# Patient Record
Sex: Female | Born: 1955
Health system: Southern US, Community
[De-identification: ages and names within clinical notes are randomized; demographics above are authoritative.]

## PROBLEM LIST (undated history)

## (undated) DIAGNOSIS — G709 Myoneural disorder, unspecified: Secondary | ICD-10-CM

## (undated) DIAGNOSIS — Z5189 Encounter for other specified aftercare: Secondary | ICD-10-CM

## (undated) DIAGNOSIS — T8859XA Other complications of anesthesia, initial encounter: Secondary | ICD-10-CM

## (undated) DIAGNOSIS — I4891 Unspecified atrial fibrillation: Secondary | ICD-10-CM

## (undated) DIAGNOSIS — C801 Malignant (primary) neoplasm, unspecified: Secondary | ICD-10-CM

## (undated) DIAGNOSIS — E0590 Thyrotoxicosis, unspecified without thyrotoxic crisis or storm: Secondary | ICD-10-CM

## (undated) DIAGNOSIS — K219 Gastro-esophageal reflux disease without esophagitis: Secondary | ICD-10-CM

## (undated) DIAGNOSIS — T7840XA Allergy, unspecified, initial encounter: Secondary | ICD-10-CM

## (undated) DIAGNOSIS — I10 Essential (primary) hypertension: Secondary | ICD-10-CM

## (undated) DIAGNOSIS — J189 Pneumonia, unspecified organism: Secondary | ICD-10-CM

## (undated) DIAGNOSIS — M1990 Unspecified osteoarthritis, unspecified site: Secondary | ICD-10-CM

## (undated) DIAGNOSIS — D649 Anemia, unspecified: Secondary | ICD-10-CM

## (undated) DIAGNOSIS — H269 Unspecified cataract: Secondary | ICD-10-CM

## (undated) HISTORY — DX: Allergy, unspecified, initial encounter: T78.40XA

## (undated) HISTORY — DX: Unspecified atrial fibrillation: I48.91

## (undated) HISTORY — DX: Malignant (primary) neoplasm, unspecified: C80.1

## (undated) HISTORY — DX: Unspecified cataract: H26.9

## (undated) HISTORY — DX: Encounter for other specified aftercare: Z51.89

## (undated) HISTORY — PX: TOTAL ABDOMINAL HYSTERECTOMY: SHX209

## (undated) HISTORY — DX: Unspecified osteoarthritis, unspecified site: M19.90

## (undated) HISTORY — DX: Anemia, unspecified: D64.9

## (undated) HISTORY — DX: Thyrotoxicosis, unspecified without thyrotoxic crisis or storm: E05.90

---

## 1986-06-26 HISTORY — PX: CHOLECYSTECTOMY: SHX55

## 1999-10-12 ENCOUNTER — Other Ambulatory Visit: Admission: RE | Admit: 1999-10-12 | Discharge: 1999-10-12 | Payer: Self-pay | Admitting: Obstetrics and Gynecology

## 2000-09-07 ENCOUNTER — Other Ambulatory Visit: Admission: RE | Admit: 2000-09-07 | Discharge: 2000-09-07 | Payer: Self-pay | Admitting: Obstetrics and Gynecology

## 2000-09-07 ENCOUNTER — Encounter (INDEPENDENT_AMBULATORY_CARE_PROVIDER_SITE_OTHER): Payer: Self-pay

## 2000-10-22 ENCOUNTER — Other Ambulatory Visit: Admission: RE | Admit: 2000-10-22 | Discharge: 2000-10-22 | Payer: Self-pay | Admitting: Obstetrics and Gynecology

## 2001-10-28 ENCOUNTER — Other Ambulatory Visit: Admission: RE | Admit: 2001-10-28 | Discharge: 2001-10-28 | Payer: Self-pay | Admitting: Obstetrics and Gynecology

## 2002-05-15 ENCOUNTER — Encounter (INDEPENDENT_AMBULATORY_CARE_PROVIDER_SITE_OTHER): Payer: Self-pay

## 2002-05-15 ENCOUNTER — Observation Stay (HOSPITAL_COMMUNITY): Admission: RE | Admit: 2002-05-15 | Discharge: 2002-05-16 | Payer: Self-pay | Admitting: Obstetrics and Gynecology

## 2002-05-17 ENCOUNTER — Encounter: Payer: Self-pay | Admitting: Obstetrics and Gynecology

## 2002-05-17 ENCOUNTER — Encounter: Payer: Self-pay | Admitting: Emergency Medicine

## 2002-05-17 ENCOUNTER — Inpatient Hospital Stay (HOSPITAL_COMMUNITY): Admission: EM | Admit: 2002-05-17 | Discharge: 2002-05-18 | Payer: Self-pay | Admitting: Emergency Medicine

## 2002-05-18 ENCOUNTER — Encounter: Payer: Self-pay | Admitting: Critical Care Medicine

## 2002-08-15 ENCOUNTER — Encounter (INDEPENDENT_AMBULATORY_CARE_PROVIDER_SITE_OTHER): Payer: Self-pay | Admitting: *Deleted

## 2002-08-15 ENCOUNTER — Ambulatory Visit (HOSPITAL_COMMUNITY): Admission: RE | Admit: 2002-08-15 | Discharge: 2002-08-15 | Payer: Self-pay | Admitting: Obstetrics and Gynecology

## 2003-01-21 ENCOUNTER — Other Ambulatory Visit: Admission: RE | Admit: 2003-01-21 | Discharge: 2003-01-21 | Payer: Self-pay | Admitting: Obstetrics and Gynecology

## 2004-02-10 ENCOUNTER — Other Ambulatory Visit: Admission: RE | Admit: 2004-02-10 | Discharge: 2004-02-10 | Payer: Self-pay | Admitting: Obstetrics and Gynecology

## 2005-03-07 ENCOUNTER — Other Ambulatory Visit: Admission: RE | Admit: 2005-03-07 | Discharge: 2005-03-07 | Payer: Self-pay | Admitting: Obstetrics and Gynecology

## 2005-06-26 HISTORY — PX: ABDOMINAL HYSTERECTOMY: SHX81

## 2006-06-01 ENCOUNTER — Encounter (INDEPENDENT_AMBULATORY_CARE_PROVIDER_SITE_OTHER): Payer: Self-pay | Admitting: *Deleted

## 2006-06-01 ENCOUNTER — Inpatient Hospital Stay (HOSPITAL_COMMUNITY): Admission: RE | Admit: 2006-06-01 | Discharge: 2006-06-04 | Payer: Self-pay | Admitting: Obstetrics and Gynecology

## 2008-12-03 ENCOUNTER — Encounter: Admission: RE | Admit: 2008-12-03 | Discharge: 2008-12-03 | Payer: Self-pay | Admitting: Family Medicine

## 2009-10-07 ENCOUNTER — Other Ambulatory Visit: Admission: RE | Admit: 2009-10-07 | Discharge: 2009-10-07 | Payer: Self-pay | Admitting: Family Medicine

## 2009-12-17 ENCOUNTER — Encounter: Admission: RE | Admit: 2009-12-17 | Discharge: 2009-12-17 | Payer: Self-pay | Admitting: Family Medicine

## 2010-11-11 NOTE — Op Note (Signed)
   NAME:  Jean Hopkins, Jean Hopkins                             ACCOUNT NO.:  [id number]   MEDICAL RECORD NO.:  [id number]                   PATIENT TYPE:  AMB   LOCATION:  SDC                                  [facility]ACILITY:  [facility]   PHYSICIAN:  Duke Salvia. Marcelle Overlie, M.D.            DATE OF BIRTH:  1956-01-27   DATE OF PROCEDURE:  05/15/2002  DATE OF DISCHARGE:                                 OPERATIVE REPORT   PREOPERATIVE DIAGNOSES:  Abnormal uterine bleeding with anemia.   POSTOPERATIVE DIAGNOSES:  Abnormal uterine bleeding with anemia.   PROCEDURE:  Dilatation and curettage/hysteroscopy.   SURGEON:  Duke Salvia. Marcelle Overlie, M.D.   ANESTHESIA:  Sedation plus paracervical block.   PROCEDURE AND FINDINGS:  The patient went to the operating room.  After an  adequate level of sedation was obtained, we placed the patient's legs in  stirrups.  Perineum and vagina were prepped with Betadine.  The bladder was  drained.  EUA carried out.  Uterus was upper limit of normal size.  Adnexa  negative.  Cervix grasped with a tenaculum after speculum was positioned.  Paracervical block created by infiltrating at 3 and 9 o'clock submucosally 5-  7 cc of 1% Xylocaine on either side after negative aspiration.  The cervix  was already sufficiently dilated which sounded to 9 cm.  D&C was carried  out.  Large amount of tissue was removed.  Cavity was explored with the  polyp forceps.  No polypoid tissue was removed.  After this was completed  the 7 mm continuous flow diagnostic hysteroscope was inserted.  The cavity  was rinsed out.  Further inspection revealed still some buildup of tissue  but there were no other definite abnormalities, specifically, no submucous  fibroids or remaining polypoid type tissue.  A second pass total curettage  was carried out.  Again, the cavity was rinsed.  Better view obtained this  time revealing it to be clean.  She tolerated this well.  EBL 10 cc.  Specimens removed were endometrial  curettings.  She went to recovery room in  good condition.                                               Richard M. Marcelle Overlie, M.D.    RMH/MEDQ  D:  05/15/2002  T:  05/15/2002  Job:  [id number]

## 2010-11-11 NOTE — Op Note (Signed)
   NAME:  Jean Hopkins, Jean Hopkins                             ACCOUNT NO.:  [id number]   MEDICAL RECORD NO.:  [id number]                   PATIENT TYPE:  AMB   LOCATION:  SDC                                  [facility]   PHYSICIAN:  Duke Salvia. Marcelle Overlie, M.D.            DATE OF BIRTH:  04/17/1956   DATE OF PROCEDURE:  08/15/2002  DATE OF DISCHARGE:                                 OPERATIVE REPORT   PREOPERATIVE DIAGNOSES:  Abnormal uterine bleeding.   POSTOPERATIVE DIAGNOSES:  Abnormal uterine bleeding.   PROCEDURE:  1. Dilatation and curettage.  2. Cryo ablation.   SURGEON:  Duke Salvia. Marcelle Overlie, M.D.   ANESTHESIA:  MAC plus paracervical block.   PROCEDURE AND FINDINGS:  The patient went to the operating room.  After an  adequate level of sedation was obtained with the patient's legs in stirrups,  perineum and vagina prepped and draped in usual manner for hysteroscopy.  Bladder was drained.  EUA carried out.  Uterus was upper limit of normal  size, mid position.  Adnexa negative.  Cervix grasped with tenaculum.  Paracervical block created by infiltrating at 3 and 9 o'clock submucosally.  5-7 mL of 1% Xylocaine on either side after negative aspiration.  The uterus  was then sounded to 12 cm.  Due to some technical difficulties with the  hysteroscope insufflation she was bypassed since she was just scoped several  months ago.  A preoperative D&C was carried out.  There was minimal tissue  and the cavity had demonstrated to be normal previously.  After this was  noted the cryo probe was placed into the right cornu at depth of  approximately 12 cm.  Six minute cryo cycle was performed.  This was allowed  to thaw out.  After waiting several minutes for the ice balls to shrink, the  cryo probe was placed in the opposite cornu at the same appropriate depth  and a six minute cryo cycle was performed.  She tolerated this well.  Went  to recovery room in good condition.                         Richard M. Marcelle Overlie, M.D.    RMH/MEDQ  D:  08/15/2002  T:  08/15/2002  Job:  [id number]

## 2010-11-11 NOTE — H&P (Signed)
NAME:  Jean Hopkins, Jean Hopkins                             ACCOUNT NO.:  [id number]   MEDICAL RECORD NO.:  [id number]                   PATIENT TYPE:  MAT   LOCATION:  MATC                                 [facility]   PHYSICIAN:  Shan Levans, M.D. [facility]            DATE OF BIRTH:  Jan 03, 1956   DATE OF ADMISSION:  05/17/2002  DATE OF DISCHARGE:                                HISTORY & PHYSICAL   CHIEF COMPLAINT:  Orthopnea and shortness of breath.   HISTORY OF PRESENT ILLNESS:  The patient is a 55 year old white female with  history of obesity. She had a D&C and hysteroscopy three days ago. She  stayed overnight for this because of nausea and vomiting and was given IV  fluids. She then developed acute onset of shortness of breath and orthopnea  this evening. No cough, no chest pain, and no hemoptysis. She came to the  emergency room. Spiral CT was indeterminate for pulmonary emboli. She is  admitted to rule out  pulmonary embolism and is a non-smoker.   PAST MEDICAL HISTORY:  No history of hypertension, asthma, or pneumonia.   MEDICATIONS:  Birth control pills the last few weeks and iron  supplementation.   ALLERGIES:  No known drug allergies..   OPERATIONS:  Cholecystectomy in 1988.   SOCIAL HISTORY:  Noncontributory. The patient is a non-smoker. 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   REVIEW OF SYSTEMS:  Noncontributory.   FAMILY HISTORY:  Noncontributory.   PHYSICAL EXAMINATION:  VITAL SIGNS: Temperature 98. Blood pressure 143/90.  Saturation 99% on room air. Pulse 86, respiratory rate 18.  GENERAL: An obese white female in no acute distress.  CHEST: Clear.  LUNGS: No active wheezing. No adventitious sounds noted.  CARDIAC: Regular rate and rhythm. Without S3. Normal S1 and S2. No murmur.  ABDOMEN: Obese and nontender. Bowel sounds active. No organomegaly.  EXTREMITIES: Trace edema bilaterally in the lower extremities.  HEENT: No jugular venous distention. No  lymphadenopathy. Oropharynx clear.  SKIN: Clear.   DIAGNOSTIC IMPRESSION:  Chest x-ray showed peribronchial thickening. CT of  the chest showed no active disease. No air space disease. Questionable  intralobular clot but cannot tell because of poor quality study. EKG normal  sinus rhythm. Otherwise normal EKG.   LABORATORY DATA:  Hemoglobin 9.6, white count 12.3, INR 0.9, PTT 28. On room  air pH 7.46, pCO2 40, pO2 74.   IMPRESSION:  Clinical history is compatible with high probability for  pulmonary embolism. However, the spiral CT study is indeterminate. Will  therefore admit with IV Heparin. Administer pulmonary arteriogram in the  morning to rule out  pulmonary embolism. Consider venous Dopplers. Consider  echocardiogram and pulmonary function studies later. Will hydrate tonight  with IV fluid normal saline and give contrast bolus tonight with the CT scan  in anticipation of the pulmonary arteriogram to be done on November  23.  2003.                                               Shan Levans, M.D. [facility]    PW/MEDQ  D:  05/17/2002  T:  05/17/2002  Job:  [id number]   cc:   Guy Sandifer. Arleta Creek, M.D.  [address]  [place]  [place] [address]  Fax: [phone]

## 2010-11-11 NOTE — Discharge Summary (Signed)
   NAME:  Jean Hopkins, Jean Hopkins                             ACCOUNT NO.:  [id number]   MEDICAL RECORD NO.:  [id number]                   PATIENT TYPE:  OBV   LOCATION:  9304                                 [facility]ILITY[facility]   PHYSICIAN:  Duke Salvia. Marcelle Overlie, M.D.            DATE OF BIRTH:  12-01-1955   DATE OF ADMISSION:  05/15/2002  DATE OF DISCHARGE:  05/16/2002                                 DISCHARGE SUMMARY   DISCHARGE DIAGNOSES:  1. Anemia with abnormal uterine bleeding.  2. Dilatation and curettage hysteroscopy this admission.  3. Postoperative nausea requiring overnight hospitalization.   HISTORY OF PRESENT ILLNESS:  For summary of the History and physical exam,  please see admission H&P for details.  Briefly, this is a 55 year old, G2,  P2 with a two-year history of abnormal uterine bleeding who presents for [facility]  hysteroscopy.   HOSPITAL COURSE:  On November 20, the patient underwent uncomplicated D&C  hysteroscopy, but due to prolonged postoperative nausea that was difficult  to control, the decision was made to hospitalize her overnight where she  received IV fluids and Zofran.  By the following morning, she was tolerating  a regular diet and felt much better and was ready for discharge at that  point.   LABORATORY DATA AND X-RAY FINDINGS:  Hemoglobin on November 19, was 9.8.  UA  was unremarkable except for a large hemoglobin, many epithelial cells, too  numerous to count bacteria, blood type A positive, antibody screen was  negative.   DISCHARGE MEDICATIONS:  1. Zofran 8 mg ODT one p.o. q.6-8h. p.r.n. nausea.  2. Hemocyte.  3. Darvocet-d 100 for pain.   CONDITION ON DISCHARGE:  Good.   ACTIVITY:  Continue to increase.   FOLLOW UP:  We will plan to see her back in one week.                                               Richard M. Marcelle Overlie, M.D.    RMH/MEDQ  D:  06/12/2002  T:  06/13/2002  Job:  [id number]

## 2010-11-11 NOTE — Op Note (Signed)
NAME:  Jean Hopkins, Jean Hopkins                   ACCOUNT NO.:  [id number]   MEDICAL RECORD NO.:  [id number]          PATIENT TYPE:  AMB   LOCATION:  SDC                           [facility]   PHYSICIAN:  Duke Salvia. Marcelle Overlie, M.D.DATE OF BIRTH:  09/02/1955   DATE OF PROCEDURE:  06/01/2006  DATE OF DISCHARGE:                               OPERATIVE REPORT   PREOPERATIVE DIAGNOSIS:  Abnormal uterine bleeding, probable  adenomyosis, leiomyoma.   POSTOPERATIVE DIAGNOSIS:  Abnormal uterine bleeding, probable  adenomyosis, leiomyoma.   PROCEDURE:  Laparoscopically assisted vaginal hysterectomy.   SURGEON:  Duke Salvia. Marcelle Overlie, M.D.   ASSISTANT:  Zelphia Cairo, M.D.   ANESTHESIA:  General endotracheal.   COMPLICATIONS:  None.   DRAINS:  Foley catheter.   BLOOD LOSS:  200   SPECIMENS REMOVED:  Uterus.   PROCEDURE AND FINDINGS:  The patient was taken to the operating room and  after an adequate level of general endotracheal anesthesia was obtained  with the patient's legs in stirrups, the abdomen, perineum and vagina  were prepped and usual manner for vaginal procedures.  The bladder was  drained with an in-and-out Foley catheter.  Hulka tenaculum was  positioned.  Uterus itself was 8-10 weeks' size, symmetrically enlarged.  The subumbilical area was infiltrated with 0.5% Marcaine plain.  A small  incision was made and Veress needle introduced without difficulty; its  intra-abdominal position was verified by pressure and water testing.  After a 2.5-L pneumoperitoneum was then created, the laparoscopic trocar  and sleeve were then introduced without difficulty.  Three  fingerbreadths above the symphysis in the midline, a 5 mm trocar was  inserted.  The patient was then placed in Trendelenburg.  Pelvic  findings revealed the uterus was 8-10 weeks' size, symmetrically  enlarged, adnexa unremarkable, cul-de-sac free and clear.  The Gyrus PK  instrument was then used to coagulate and cut the  utero-ovarian pedicle  down to and including the round ligament on each side with excellent  hemostasis.  The vaginal portion of the procedure started at that point.  The legs were extended.  Weighted speculum was positioned.  Cervicovaginal mucosa was incised, posterior culdotomy performed without  difficulty.  The handheld Gyrus PK was then used to coagulate and divide  the uterosacral ligaments on each side.  The bladder was advanced  superiorly until the peritoneal reflection could be identified.  This  was entered sharply and a retractor used to gently elevate the bladder  out of the field.  In sequential manner, the cardinal ligament, uterine  vasculature pedicles and upper broad ligament pedicles were clamped,  coagulated and divided with the Gyrus PK handheld instrument.  Morcellation was carried out to reduce the size of the uterus.  The  remaining fundus was delivered posteriorly and the remaining pedicle was  clamped and divided and free-tied with 0 Vicryl suture.  Cuff was closed  with 3 to 9 o'clock with a running locked 2-0 Vicryl suture.  Prior to  closure, sponge, needle and instrument counts were reported as correct  x2, vaginal mucosa closed right-to-left with  interrupted 2-0 Monocryl  sutures, catheter positioned draining clear urine.  Laparoscopy and  irrigation with a Nezhat was carried out.  Pressure was reduced.  The  operative site was noted to be hemostatic.  Instruments were removed.  The irrigant was suctioned out.  Incisions were closed with 4-0 Vicryl  Rapide subcuticular and Dermabond.  She tolerated this well and went to  the recovery room in good condition.      Richard M. Marcelle Overlie, M.D.  Electronically Signed     RMH/MEDQ  D:  06/01/2006  T:  06/01/2006  Job:  [id number]

## 2010-11-11 NOTE — H&P (Signed)
NAME:  Laser, Caydee                   ACCOUNT NO.:  [id number]   MEDICAL RECORD NO.:  [id number]          PATIENT TYPE:  AMB   LOCATION:                                [facility]   PHYSICIAN:  Duke Salvia. Marcelle Overlie, M.D.DATE OF BIRTH:  [birth year]   DATE OF ADMISSION:  DATE OF DISCHARGE:                              HISTORY & PHYSICAL   CHIEF COMPLAINT:  Menorrhagia.   HISTORY OF PRESENT ILLNESS:  A 55 year old G2, P2.  Her husband has had  a vasectomy.  This patient underwent D&C/hysteroscopy and cryoablation  in 2004.  Initially got improvement of her bleeding, but now continues  to have significant menorrhagia.  Presents now for definitive LAVH.  This procedure, including the risks of bleeding, infection, adjacent  organ injury and possible need for open or additional surgery,  transfusion risks, other risks related to wound infection, phlebitis;  along with her expected recovery time all discussed -- which she  understands and accepts.  Her preference would be to conserve her  otherwise normal appearing ovaries, which will be evaluated at the time  of surgery.   Ultrasound dated May 19, 2006 in our office showed several  intramural fibroids (2.0, 1.7 and 1.3 cm), a simple cyst on the left  ovary; no other abnormalities.  Attempt at saline infusion showed  significant scarring of the cavity from her prior cryoablation.   PAST MEDICAL HISTORY:   ALLERGIES:  None.   OPERATIONS:  1. D&C/hysteroscopy.  2. Cholecystectomy.  3. Two vaginal deliveries at term without complication.   SOCIAL HISTORY:  She is a nonsmoker.   FAMILY HISTORY:  Significant for mother and father with hypertension;  otherwise unremarkable.   PHYSICAL EXAMINATION:  VITAL SIGNS:  Temperature 98.2, blood pressure  120/78.  HEENT:  Unremarkable.  NECK:  Supple without masses.  LUNGS:  Clear.  CARDIOVASCULAR:  Regular rate and rhythm without murmurs, rubs or  gallops.  BREASTS:  Without masses.  ABDOMEN:  Soft, flat and nontender.  PELVIC:  Normal external genitalia.  Antionette Fairy and cervix__ clear.  Uterus  normal size and position.  Adnexa negative.  EXTREMITIES:  Unremarkable.  NEUROLOGIC:  Unremarkable.   IMPRESSION:  Menorrhagia, symptomatic leiomyoma, status post  cryoablation.   PLAN:  Laparoscopically-assisted vaginal hysterectomy.  The procedure  and risks were reviewed as above.      Richard M. Marcelle Overlie, M.D.  Electronically Signed     RMH/MEDQ  D:  05/26/2006  T:  05/27/2006  Job:  [id number]

## 2010-11-11 NOTE — Discharge Summary (Signed)
NAME:  Jean Hopkins, Jean Hopkins                             ACCOUNT NO.:  [id number]   MEDICAL RECORD NO.:  [id number]                   PATIENT TYPE:  INP   LOCATION:  0379                                 FACILITY:  [facility]   PHYSICIAN:  Shan Levans, M.D. [facility]            DATE OF BIRTH:  23-Apr-1956   DATE OF ADMISSION:  05/17/2002  DATE OF DISCHARGE:  05/18/2002                                 DISCHARGE SUMMARY   DISCHARGE DIAGNOSES:  1. Dyspnea, likely from restrictive defect secondary to obesity in     combination with anemia.  Negative for pulmonary embolism.  2. Mild pulmonary artery hypertension likely secondary to restrictive defect     from obesity.   OPERATIONS/PROCEDURES:  1. Pulmonary arteriogram.  2. Spiral CT scan of the chest.   HISTORY OF PRESENT ILLNESS:  The patient is a 55 year old white female,  obese, had a D&C and hysterectomy three days ago.  Stayed overnight  secondary to nausea and vomiting.  Was given 4 L of IV fluid, then developed  acute onset of increased dyspnea and orthopnea.  No cough, no chest pain, no  hemoptysis.  She came to the emergency department.  Spiral CT was  inconclusive.  She was admitted for IV heparin and follow-up pulmonary  arteriogram.   PAST MEDICAL HISTORY:  No history of hypertension, asthma, or pneumonia.   MEDICATIONS:  Birth control pills.  Iron supplementation.   ALLERGIES:  None.   PAST SURGICAL HISTORY:  Cholecystectomy in 1988.   SOCIAL HISTORY:  Noncontributory.   FAMILY HISTORY:  Noncontributory.   REVIEW OF SYSTEMS:  Noncontributory.   PHYSICAL EXAMINATION:  VITAL SIGNS:  Temperature 98, blood pressure 143/90,  saturation 99% on room air, pulse 86, respirations 18.  CHEST:  Clear.  CARDIAC:  Regular rate and rhythm with S3.  No S1, S2.  No murmur.  ABDOMEN:  Obese.  No organomegaly.  No masses.  EXTREMITIES:  Trace edema bilateral lower extremities.  HEENT:  No jugular venous distention.  _____________.  The oropharynx  is  clear.   LABORATORY DATA:  Hemoglobin initially 9.6, was then followed up at 8.3  after heparinization and IV fluid hydration.  White count 12.3.  INR 0.9.  A  pH of 7.46, pCO2 was 40, pO2 74 on room air.   EKG:  Normal sinus rhythm.   HOSPITAL COURSE:  The patient underwent pulmonary arteriogram.  This  revealed no evidence of pulmonary emboli.  However, the pulmonary artery  pressures were elevated at 48/23 with a mean of 39, but no pulmonary emboli  were seen.  The patient's heparin was discontinued, and she was ready for  release.   DISPOSITION:  The patient is discharged to receive iron supplementation 325  mg three times daily.  Also, multivitamins are recommended.  She will follow  up in my office in two weeks' time with a repeat CBC and  set up for  pulmonary function studies and echocardiogram.  My suspicion is that the  dyspnea has a multifactorial basis secondary to obesity with restrictive  lung disease from obese body habitus and anemia which is in part dilutional  with recent D&C and vaginal blood loss and, as well, all of this conspiring  to cause mild pulmonary hypertension.  The patient is discharged in improved  condition, stable, and will stay off work for at least one week's time while  recovering from this.  Also will follow up with Dr. Henderson Cloud in his office.                                               Shan Levans, M.D. [facility]    PW/MEDQ  D:  05/18/2002  T:  05/18/2002  Job:  [id number]   cc:   Guy Sandifer. Arleta Creek, M.D.  [address]  [place]  [place] [address]  Fax: [phone]

## 2010-11-11 NOTE — H&P (Signed)
   NAME:  Jean Hopkins, Jean Hopkins                             ACCOUNT NO.:  [id number]   MEDICAL RECORD NO.:  [id number]                   PATIENT TYPE:  AMB   LOCATION:  SDC                                  [facility]   PHYSICIAN:  Duke Salvia. Marcelle Overlie, M.D.            DATE OF BIRTH:  March 25, 1956   DATE OF ADMISSION:  05/15/2002  DATE OF DISCHARGE:                                HISTORY & PHYSICAL   CHIEF COMPLAINT:  Menorrhagia with anemia.   HISTORY OF PRESENT ILLNESS:  A 55 year old G2 P2 whose husband has had a  vasectomy.  She has had a one- to two-year history of abnormal uterine  bleeding that had been treated previously with Prometrium cycling.  Back in  March 2002 office biopsy showed simple hyperplasia without atypia with a  normal FSH.  She was cycled with monthly Prometrium which she tapered off  and was actually having fairly normal cycles until recently, when she  presented with heavy bleeding.   When seen in our office recently, hemoglobin was 7.9 and she was started on  iron.  SHG showed a normal uterus with significant tissue buildup and a  probable endometrial polyp at the fundus.  She presents now for a D&C  hysteroscopy.  This procedure including risks of bleeding, infection,  transfusion, other complications such as perforation that may require  additional surgery all discussed with her which she understands and accepts.   ALLERGIES:  None.   OPERATIONS:  None.   REVIEW OF SYSTEMS:  Significant for DUB and anovulation.   OBSTETRICAL HISTORY:  Two vaginal deliveries without complication.   FAMILY HISTORY:  Significant for hypertension in her mother and father;  otherwise negative.   PHYSICAL EXAMINATION:  VITAL SIGNS:  Temperature 98.2, blood pressure  120/80.  HEENT:  Unremarkable.  NECK:  Supple without masses.  LUNGS:  Clear.  CARDIOVASCULAR:  Respiratory rate without murmurs, rubs, or gallops noted.  BREASTS:  Without masses.  ABDOMEN:  Soft, flat, and  nontender.  PELVIC:  Normal external genitalia, vagina and cervix clear.  Uterus mid  position, normal size.  Adnexa negative.   IMPRESSION:  Menorrhagia, history of anovulation, probable endometrial  polyps.   PLAN:  D&C hysteroscopy.  Procedure and risks reviewed as above.                                               Richard M. Marcelle Overlie, M.D.   RMH/MEDQ  D:  05/12/2002  T:  05/12/2002  Job:  [id number]

## 2010-11-11 NOTE — Discharge Summary (Signed)
NAME:  Jean Hopkins, Jean Hopkins                   ACCOUNT NO.:  [id number]   MEDICAL RECORD NO.:  [id number]          PATIENT TYPE:  INP   LOCATION:  9309                          FACILITY:  [facility]   PHYSICIAN:  Duke Salvia. Marcelle Overlie, M.D.DATE OF BIRTH:  17-Dec-1955   DATE OF ADMISSION:  06/01/2006  DATE OF DISCHARGE:  06/04/2006                               DISCHARGE SUMMARY   DISCHARGE DIAGNOSES:  1. Abnormal uterine bleeding, previous cryoablation.  2. Laparoscopic assisted vaginal hysterectomy.  3. Postop bleeding requiring postop laparotomy with evacuation of      clot.  4. Two-unit blood transfusion.   SUMMARY OF HISTORY AND PHYSICAL EXAM:  Please see admission H&P for  details.  Briefly, this is a 55 year old who has had a prior  cryoablation and continued to have problematic menorrhagia and presented  for definitive hysterectomy.   HOSPITAL COURSE:  On June 01, 2006, under general anesthesia, the  patient underwent LAVH with 200 cc  blood loss.  Later that afternoon,  due to some hypotension, there was concern about possible vasovagal  reaction.  Initially, anesthesia was called, and she was given fluids  and ephedrine.  Her hemoglobin at that point was in the 9.6 range but  represented a substantial drop from her preop of 14.  Due to hemodynamic  instability with low blood pressure and orthostasis, the decision was  made to proceed with exploratory surgery for expected intra-abdominal  bleeding.  She had no external bleeding at that point.   On the night of surgery, she underwent laparoscopy.  Significant amount  of clot was noted.  Good exposure could not be obtained.  Therefore,  laparotomy was done.  She had evacuation of a large amount of clot and  had pressure applied to the surgical site.  There was some minimal  venous oozing at that point.  Careful observation revealed no arterial  bleeding.  There was no other evidence of any retroperitoneal hematoma.  Gelfoam was placed at  the vaginal cuff area.  She was observed carefully  in the recovery room.  During the night, hemoglobin had a low of 5.9.  She received a two-unit blood transfusion and maintained adequate urine  output.  After transfusion, her hemoglobin was 7.  On June 03, 2006,  hemoglobin was 6.6.  She was afebrile.  Vital signs were stable.  She  had excellent urine output.  Iron was started.  Catheter was removed,  and she was sent to the floor from ICU at that point.  On the morning of  June 04, 2006, postop day three, hemoglobin was stable at 6.6.  She  was tolerating this without orthostasis and did not desire further  transfusions since she was functional at that point.  She was discharged  with abdominal exam that was unremarkable.   DISPOSITION:  The patient was discharged on Tylox p.r.n. for pain and  ferrous sulfate 325 mg 1 p.o. t.i.d.  She will return the office in two  days to have the clips removed and her hemoglobin checked.  Advised her  to report any incisional  redness or drainage, increased pain or bleeding  or fever over 101.  She was given other specific instructions regarding  diet, sex and exercise.   CONDITION ON DISCHARGE:  Good.   DISCHARGE ACTIVITIES:  Graded increase.      Richard M. Marcelle Overlie, M.D.  Electronically Signed     RMH/MEDQ  D:  06/04/2006  T:  06/04/2006  Job:  [id number]

## 2010-11-11 NOTE — H&P (Signed)
NAME:  Hopkins, Jean BORIN                             ACCOUNT NO.:  [id number]   MEDICAL RECORD NO.:  [id number]                   PATIENT TYPE:  AMB   LOCATION:  SDC                                  [facility]   PHYSICIAN:  Duke Salvia. Marcelle Overlie, M.D.            DATE OF BIRTH:  [birth year]   DATE OF ADMISSION:  08/15/2002  DATE OF DISCHARGE:                                HISTORY & PHYSICAL   CHIEF COMPLAINT:  Abnormal bleeding.   HISTORY OF PRESENT ILLNESS:  A 55 year old G2 P2 - her husband has had a  vasectomy - who has had continued problems with abnormal uterine bleeding  and presents now for [facility] hysteroscopy and cryoablation.   In November 2003 she had D&C hysteroscopy that showed endometrium that was  removed but no other abnormalities.  The pathology showed benign mixed phase  endometrium with endometrial polyp formation and focal simple hyperplasia  without atypia.  Since that time she has continued to have some problems  with irregular bleeding and anemia, has been back on OCPs but without  significant improvement, and presents now for cryoablation.  This procedure  including a 40% chance of amenorrhea, 80% chance of hypomenorrhea, and other  risks regarding infection, bleeding, or complications such as perforation  that may require additional surgery all reviewed with her which she  understands and accepts.  After her D&C hysteroscopy in November she did  experience shortness of breath, was seen by Dr. Danise Mina, underwent a  number of diagnostic tests including angiography - all of which were  negative for pulmonary embolus.   PAST MEDICAL HISTORY:  1. Allergies:  None.  2. Operations:  D&C hysteroscopy, cholecystectomy in 1988.   REVIEW OF SYMPTOMS:  Significant for anemia, AUB.   OBSTETRICAL HISTORY:  Two vaginal deliveries at term without complication.   FAMILY HISTORY:  Significant for hypertension in her mother and father; is  otherwise negative.   SOCIAL  HISTORY:  She is a nonsmoker.  She is a Engineer, civil (consulting) at [employer].   PHYSICAL EXAMINATION:  VITAL SIGNS:  Temperature 98.2, blood pressure  124/88.  HEENT:  Unremarkable.  NECK:  Supple without masses.  LUNGS:  Clear.  CARDIOVASCULAR:  Regular rate and rhythm without murmurs, rubs, or gallops  noted.  BREASTS:  Without masses.  ABDOMEN:  Soft, flat, nontender.  PELVIC:  Normal external genitalia; vagina and cervix clear.  Uterus mid  position, normal size.  Adnexa negative.  EXTREMITIES AND NEUROLOGIC:  Unremarkable.    IMPRESSION:  Abnormal uterine bleeding, normal pathology in hysteroscopy  November 2003, presents now for cryoablation.  Procedure and risks reviewed  as above.  Richard M. Marcelle Overlie, M.D.    RMH/MEDQ  D:  08/11/2002  T:  08/11/2002  Job:  [id number]

## 2010-11-11 NOTE — Op Note (Signed)
NAME:  Jean Hopkins, Jean Hopkins                   ACCOUNT NO.:  [id number]   MEDICAL RECORD NO.:  [id number]          PATIENT TYPE:  OIB   LOCATION:  9306                          [facility]   PHYSICIAN:  Duke Salvia. Marcelle Overlie, M.D.DATE OF BIRTH:  27-Nov-1955   DATE OF PROCEDURE:  06/01/2006  DATE OF DISCHARGE:                               OPERATIVE REPORT   PREOPERATIVE DIAGNOSIS:  Postoperative bleeding, status post  laparoscopically assisted vaginal hysterectomy this a.m.   POSTOPERATIVE DIAGNOSIS:  Postoperative bleeding, status post  laparoscopically assisted vaginal hysterectomy this a.m.   PROCEDURE:  Diagnostic laparoscopy followed by laparotomy with  evacuation of clot, placement of Gelfoam at the vaginal cuff.   ESTIMATED BLOOD LOSS:  1500 mL, mainly old blood clot.   ANESTHESIA:  General.   DRAINS:  Foley catheter, On-Q pump catheters x2.   SPECIMENS REMOVED:  None.   INDICATIONS:  This patient underwent uneventful LAVH this a.m. with an  EBL of 200 mL.  In the recovery room, she was stable, there was no  external bleeding.  However, which she was transferred to the floor, was  evaluated by anesthesia for what was thought to be a vasovagal reaction  with hypotension.  She was treated with IV fluids and ephedrine.  When  she failed to improve at about 1:00 p.m., I was notified.  She had  decreased urine output at that point. Hemoglobin was checked and was  noted be 9.6.  She was given further fluid bolus.  I evaluated her at  that point. She was noted to have significant pallor and was still  hypotensive but  not tachycardic at that point.  Serial hemoglobins over  the next couple of hours remained stable in the 9.2 to 9.3 range but due  to continued episodes of hypotension and pallor, the decision was made  to proceed with surgical evaluation for probable intra-abdominal  bleeding.  She was typed and crossmatched and taken to the operating  room.   PROCEDURE AND FINDINGS:   The patient was taken to the operating room,  was hemodynamically stable, general anesthesia was instituted, the  abdomen prepped and draped.  A Foley catheter was already positioned.  The subumbilical area suture was opened and the 10/11 trocar and sleeve  were introduced in the laparoscopic incision from this a.m.  Insufflation was carried out, 5 mm trocar was inserted into the lower  incision.  She was placed in Trendelenburg.  She had a significant  amount, estimated at 1500 mL, old blood clot.  We tried different sump  sucker to remove the clot in order to be able to visualize the area of  the cuff unsuccessfully. Due to inability to visualize whether or not  there may be some arterial or significant venous bleeding, the decision  was made to proceed with Pfannenstiel laparotomy.  Her legs were placed  flat.  A Pfannenstiel incision was made and carried down to the fascia  which was incised and extended transversely.  The rectus muscles were  divided in the midline.  The peritoneum was entered superiorly without  incident and extended in a vertical fashion.  The sump sucker was then  used to evacuate all clot.  An O'Connor-O'Sullivan retractor was  positioned.  The bowel was packed superiorly out of the field.  Using  the pool and pump sucker, copious irrigation was carried out and  systematic inspection starting with the right ovary, which was  hemostatic, the area of the left ovarian dissection was hemostatic,  also.  With the scrub tech's assistance, the area of the cuff was  examined.  There was no arterial bleeding noted, very minimal oozing.  Firm pressure was applied to the cuff area for five minutes and  released.  There was no significant bleeding noted, just some minimal  oozing.  This was observed over time, irrigated, aspirated, and further  observed. No fresh bleeding was noted other than minimal oozing.  Gelfoam was positioned at the area of the vaginal cuff.  The  instruments  were removed. After the sponge count was correct x2, the peritoneum was  closed with a 2-0 Vicryl suture.  The fascia was closed from laterally  to midline on either side with a 0 PDS suture.  The On-Q catheters were  placed subfascial and subcutaneous.  The subcutaneous fat was  hemostatic.  She remained stable hemodynamically during the case.  I did  give Ancef 1 gram IV.  A sterile dressing was applied.  Clear urine was  noted at the end of the case.      Richard M. Marcelle Overlie, M.D.  Electronically Signed     RMH/MEDQ  D:  06/01/2006  T:  06/01/2006  Job:  [id number]

## 2011-01-25 ENCOUNTER — Other Ambulatory Visit: Payer: Self-pay | Admitting: Family Medicine

## 2011-01-25 DIAGNOSIS — Z1231 Encounter for screening mammogram for malignant neoplasm of breast: Secondary | ICD-10-CM

## 2011-02-03 ENCOUNTER — Ambulatory Visit
Admission: RE | Admit: 2011-02-03 | Discharge: 2011-02-03 | Disposition: A | Discharge status: Home / Self Care | Payer: BC Managed Care – PPO | Source: Ambulatory Visit | Attending: Family Medicine | Admitting: Family Medicine

## 2011-02-03 DIAGNOSIS — Z1231 Encounter for screening mammogram for malignant neoplasm of breast: Secondary | ICD-10-CM

## 2012-03-13 ENCOUNTER — Other Ambulatory Visit: Payer: Self-pay | Admitting: Family Medicine

## 2012-03-13 DIAGNOSIS — Z1231 Encounter for screening mammogram for malignant neoplasm of breast: Secondary | ICD-10-CM

## 2012-04-04 ENCOUNTER — Ambulatory Visit
Admission: RE | Admit: 2012-04-04 | Discharge: 2012-04-04 | Disposition: A | Discharge status: Home / Self Care | Payer: BC Managed Care – PPO | Source: Ambulatory Visit | Attending: Family Medicine | Admitting: Family Medicine

## 2012-04-04 DIAGNOSIS — Z1231 Encounter for screening mammogram for malignant neoplasm of breast: Secondary | ICD-10-CM

## 2012-08-15 ENCOUNTER — Other Ambulatory Visit: Payer: Self-pay | Admitting: Dermatology

## 2013-03-28 ENCOUNTER — Other Ambulatory Visit: Payer: Self-pay

## 2013-03-28 DIAGNOSIS — Z1231 Encounter for screening mammogram for malignant neoplasm of breast: Secondary | ICD-10-CM

## 2013-04-07 ENCOUNTER — Ambulatory Visit
Admission: RE | Admit: 2013-04-07 | Discharge: 2013-04-07 | Disposition: A | Discharge status: Home / Self Care | Payer: No Typology Code available for payment source | Source: Ambulatory Visit

## 2013-04-07 DIAGNOSIS — Z1231 Encounter for screening mammogram for malignant neoplasm of breast: Secondary | ICD-10-CM

## 2014-03-12 ENCOUNTER — Other Ambulatory Visit: Payer: Self-pay

## 2014-03-12 DIAGNOSIS — Z1231 Encounter for screening mammogram for malignant neoplasm of breast: Secondary | ICD-10-CM

## 2014-04-09 ENCOUNTER — Ambulatory Visit
Admission: RE | Admit: 2014-04-09 | Discharge: 2014-04-09 | Disposition: A | Discharge status: Home / Self Care | Payer: No Typology Code available for payment source | Source: Ambulatory Visit

## 2014-04-09 ENCOUNTER — Encounter (INDEPENDENT_AMBULATORY_CARE_PROVIDER_SITE_OTHER): Payer: Self-pay

## 2014-04-09 DIAGNOSIS — Z1231 Encounter for screening mammogram for malignant neoplasm of breast: Secondary | ICD-10-CM

## 2015-03-15 ENCOUNTER — Other Ambulatory Visit: Payer: Self-pay

## 2015-03-15 DIAGNOSIS — Z1231 Encounter for screening mammogram for malignant neoplasm of breast: Secondary | ICD-10-CM

## 2015-04-12 ENCOUNTER — Ambulatory Visit
Admission: RE | Admit: 2015-04-12 | Discharge: 2015-04-12 | Disposition: A | Discharge status: Home / Self Care | Payer: 59 | Source: Ambulatory Visit

## 2015-04-12 DIAGNOSIS — Z1231 Encounter for screening mammogram for malignant neoplasm of breast: Secondary | ICD-10-CM

## 2016-03-13 ENCOUNTER — Other Ambulatory Visit: Payer: Self-pay | Admitting: Family Medicine

## 2016-03-13 DIAGNOSIS — Z1231 Encounter for screening mammogram for malignant neoplasm of breast: Secondary | ICD-10-CM

## 2016-04-12 ENCOUNTER — Ambulatory Visit
Admission: RE | Admit: 2016-04-12 | Discharge: 2016-04-12 | Disposition: A | Discharge status: Home / Self Care | Payer: 59 | Source: Ambulatory Visit | Attending: Family Medicine | Admitting: Family Medicine

## 2016-04-12 DIAGNOSIS — Z1231 Encounter for screening mammogram for malignant neoplasm of breast: Secondary | ICD-10-CM

## 2016-08-11 DIAGNOSIS — J069 Acute upper respiratory infection, unspecified: Secondary | ICD-10-CM | POA: Diagnosis not present

## 2016-10-31 DIAGNOSIS — M79602 Pain in left arm: Secondary | ICD-10-CM | POA: Diagnosis not present

## 2016-11-10 DIAGNOSIS — M4722 Other spondylosis with radiculopathy, cervical region: Secondary | ICD-10-CM | POA: Diagnosis not present

## 2016-11-15 ENCOUNTER — Ambulatory Visit: Payer: BLUE CROSS/BLUE SHIELD | Attending: Orthopedic Surgery | Admitting: Physical Therapy

## 2016-11-15 DIAGNOSIS — R293 Abnormal posture: Secondary | ICD-10-CM | POA: Diagnosis not present

## 2016-11-15 DIAGNOSIS — M5413 Radiculopathy, cervicothoracic region: Secondary | ICD-10-CM

## 2016-11-15 DIAGNOSIS — M542 Cervicalgia: Secondary | ICD-10-CM | POA: Diagnosis not present

## 2016-11-15 NOTE — Therapy (Signed)
[facility] [address]  [place], [place], [address] Phone: [phone]   Fax:  [phone]  Physical Therapy Evaluation  Patient Details  Name: SURABHI GADEA MRN: [id number] Date of Birth: [birth year] Referring Provider: Dr. Tania Ade  Encounter Date: 11/15/2016      PT End of Session - 11/15/16 0855    Visit Number 1   Number of Visits 12   Date for PT Re-Evaluation 12/27/16   PT Start Time 0757   PT Stop Time 0841   PT Time Calculation (min) 44 min   Activity Tolerance Patient tolerated treatment well   Behavior During Therapy [facility] for tasks assessed/performed      No past medical history on file.  No past surgical history on file.  There were no vitals filed for this visit.       Subjective Assessment - 11/15/16 0757    Subjective Patient reporting burning pain 3-4 weeks agon into scapula and L arm. Has had some tingling/numbness into L arm. Took prednisone for 1 week - helped a little, symptoms has came back since coming off. Has had cervical xrays. Saw ortho MD - wants to try PT then may see about MRI. Take ibuprofen 800 mg 2x/day. Pain and numbness is constant - but is better at night and in the morning. TIngling is into whole hand. No known mechanism of injury.    Diagnostic tests Xray - cervical spine - negative   Patient Stated Goals improve pain and N&T   Currently in Pain? Yes   Pain Score 5    Pain Location Scapula  upper arm/elbow   Pain Orientation Left   Pain Descriptors / Indicators Burning;Aching   Pain Type Acute pain   Pain Onset 1 to 4 weeks ago   Pain Frequency Intermittent   Aggravating Factors  walking (swinging arm)   Pain Relieving Factors pressure through joint, sleeping, heat (intermittent help)            [facility] PT Assessment - 11/15/16 0802      Assessment   Medical Diagnosis L cervical radiculitis   Referring Provider Dr. 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   Leisure shopping, exercise, [place]     Cognition   Overall Cognitive Status Within Functional Limits for tasks assessed     Observation/Other Assessments   Focus on Therapeutic Outcomes (FOTO)  Neck: 70 (30% limited, predicted 21% limited)     Sensation   Light Touch Appears Intact     Coordination   Gross Motor Movements are Fluid and Coordinated Yes   Fine Motor Movements are Fluid and Coordinated Yes     Posture/Postural Control   Posture/Postural Control Postural limitations   Postural Limitations Rounded Shoulders;Increased lumbar lordosis     ROM / Strength   AROM / PROM / Strength AROM;Strength;PROM  AROM   Overall AROM  Within functional limits for tasks performed   Overall AROM Comments "twinge of pain" with full cervical flexion   AROM Assessment Site Shoulder;Cervical     PROM   Overall PROM Comments C1/C2 flexion/rotation - full, some reported tightness with both L>R     Strength   Overall Strength Within functional limits for tasks performed   Overall Strength Comments B UE     Palpation   Spinal mobility some tightness from approx C4-T4   Palpation comment tenderness/tightness reported along B UT (L>R), L LS, L infra/teres group                           PT Education - 11/15/16 0855    Education provided Yes   Education Details exam findings, POC, HEP   Person(s) Educated Patient   Methods Explanation;Demonstration;Handout    Comprehension Verbalized understanding;Returned demonstration             PT Long Term Goals - 11/15/16 0909      PT LONG TERM GOAL #1   Title Patient to be independent with advanced HEP (12/27/16)   Status New     PT LONG TERM GOAL #2   Title Patient to demonstrate good postural alignment and postural arwareness with ability to self correct (12/27/16)   Status New     PT LONG TERM GOAL #3   Title Patient to report reduction in radicular symptoms by >/= 50% for greater than 2 weeks (12/27/16)   Status New     PT LONG TERM GOAL #4   Title Patient to improve tissue quality and joint mobility as noted by palpation and joint mobilizations (12/27/16)   Status New               Plan - 11/15/16 0855    Clinical Impression Statement Mychaela is a 61 y/o female presenting to [facility] today for low complexity eval regarding chief complaints of L sided neck/arm pain with associated numbness and tingling extending into fingers of L hand. Patient today with full B UE AROM as well as cervical AROM - some reported pain with end range cervical flexion. Patient with noted tightness throughout B UT (L>R), L LS, as well as L infra teres group with increases symtpoms into hand with deep pressure. Patient today given initial HEP for stretching and postural re-ed as well as brachial plexus nerve glide with good carryover. Patient to benefit from PT to address pain and numbness to allow for improved functional mobility and use of LUE.   Rehab Potential Good   PT Frequency 2x / week   PT Duration 6 weeks   PT Treatment/Interventions ADLs/Self Care Home Management;Cryotherapy;Electrical Stimulation;Iontophoresis 4mg /ml Dexamethasone;Moist Heat;Traction;Ultrasound;Neuromuscular re-education;Therapeutic exercise;Therapeutic activities;Patient/family education;Manual techniques;Passive range of motion;Vasopneumatic Device;Taping;Dry needling   Consulted and Agree with Plan of Care Patient      Patient will  benefit from skilled therapeutic intervention in order to improve the following deficits and impairments:  Pain, Impaired UE functional use, Decreased activity tolerance  Visit Diagnosis: Cervicalgia - Plan: PT plan of care cert/re-cert  Radiculopathy, cervicothoracic region - Plan: PT plan of care cert/re-cert  Abnormal posture - Plan: PT plan of care cert/re-cert     Problem List There are no active problems to display for this patient.    Lanney Gins, PT, DPT 11/15/16 9:49 AM   [facility] [place]  [place], [place], [address] Phone: [phone]   Fax:  [phone]  Name: WYNONNA FITZHENRY MRN: [id number] Date of Birth: [birth year]

## 2016-11-15 NOTE — Patient Instructions (Signed)
Axial Extension (Chin Tuck)    Pull chin in and lengthen back of neck. Hold __5-10__ seconds while counting out loud. Repeat __15__ times. Do __3-5__ sessions per day.   Flexibility: Upper Trapezius Stretch    Gently grasp right side of head while reaching behind back with other hand. Tilt head away until a gentle stretch is felt. Hold __30__ seconds. Repeat _3___ times per set. Do _2___ sets per session.   Scapular Retraction (Standing)    With arms at sides, pinch shoulder blades together. Repeat __15__ times per set.  Do _3-5___ sessions per day.

## 2016-11-16 ENCOUNTER — Encounter: Payer: Self-pay | Admitting: Rehabilitation

## 2016-11-16 ENCOUNTER — Ambulatory Visit: Payer: BLUE CROSS/BLUE SHIELD | Admitting: Rehabilitation

## 2016-11-16 DIAGNOSIS — M5413 Radiculopathy, cervicothoracic region: Secondary | ICD-10-CM

## 2016-11-16 DIAGNOSIS — M542 Cervicalgia: Secondary | ICD-10-CM | POA: Diagnosis not present

## 2016-11-16 DIAGNOSIS — R293 Abnormal posture: Secondary | ICD-10-CM

## 2016-11-16 NOTE — Therapy (Signed)
[facility] [address]  [place], [place], [address] Phone: [phone]   Fax:  [phone]  Physical Therapy Treatment  Patient Details  Name: Jean Hopkins MRN: [id number] Date of Birth: 11-06-1955 Referring Provider: Dr. Tania Ade  Encounter Date: 11/16/2016      PT End of Session - 11/16/16 1009    Visit Number 2   Number of Visits 12   Date for PT Re-Evaluation 12/27/16   PT Start Time 0800   PT Stop Time 0900   PT Time Calculation (min) 60 min   Activity Tolerance Patient tolerated treatment well      History reviewed. No pertinent past medical history.  History reviewed. No pertinent surgical history.  There were no vitals filed for this visit.      Subjective Assessment - 11/16/16 0759    Subjective was aggravated by eval yesterday; bad but not too bad.  The exercises help.     Currently in Pain? Yes   Pain Score 1    Pain Location Scapula   Pain Orientation Left   Pain Descriptors / Indicators Aching;Burning            [facility] PT Assessment - 11/15/16 0802      Assessment   Medical Diagnosis L cervical radiculitis   Referring Provider Dr. 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   Leisure shopping, exercise, [place]     Cognition   Overall Cognitive Status Within Functional Limits for tasks assessed     Observation/Other Assessments   Focus on Therapeutic Outcomes (FOTO)  Neck: 70  (30% limited, predicted 21% limited)     Sensation   Light Touch Appears Intact     Coordination   Gross Motor Movements are Fluid and Coordinated Yes   Fine Motor Movements are Fluid and Coordinated Yes     Posture/Postural Control   Posture/Postural Control Postural limitations   Postural Limitations Rounded Shoulders;Increased lumbar lordosis     ROM / Strength   AROM / PROM / Strength AROM;Strength;PROM     AROM   Overall AROM  Within functional limits for tasks performed   Overall AROM Comments "twinge of pain" with full cervical flexion   AROM Assessment Site Shoulder;Cervical     PROM   Overall PROM Comments C1/C2 flexion/rotation - full, some reported tightness with both L>R     Strength   Overall Strength Within functional limits for tasks performed   Overall Strength Comments B UE     Palpation   Spinal mobility some tightness from approx C4-T4   Palpation comment tenderness/tightness reported along B UT (L>R), L LS, L infra/teres group                     [facility] Adult PT Treatment/Exercise - 11/16/16 0001      Exercises  Exercises Neck     Neck Exercises: Machines for Strengthening   UBE (Upper Arm Bike) Level 3 2/2     Neck Exercises: Seated   Neck Retraction 15 reps;5 secs   Postural Training seated scapular retraction x 10 and with red band x 15     Modalities   Modalities Moist Heat     Moist Heat Therapy   Number Minutes Moist Heat 15 Minutes   Moist Heat Location Shoulder  scapula     Manual Therapy   Manual Therapy Joint mobilization;Soft tissue mobilization   Joint Mobilization CPAs grade IV C7-T6 and bil UPAs Tspine 2x30" each, scapular PROM   Soft tissue mobilization prone to medial scapular border/LS     Neck Exercises: Stretches   Upper Trapezius Stretch 30 seconds;2 reps   Upper Trapezius Stretch Limitations Left only   Corner Stretch 3 reps;30 seconds   Other Neck Stretches standing brachial plexus mobilization x 10  with instruction   Other Neck Stretches supine on FR thoracic opening x 63min                PT Education - 11/15/16 0855    Education provided Yes   Education Details exam findings, POC, HEP   Person(s) Educated Patient   Methods Explanation;Demonstration;Handout   Comprehension Verbalized understanding;Returned demonstration             PT Long Term Goals - 11/15/16 0909      PT LONG TERM GOAL #1   Title Patient to be independent with advanced HEP (12/27/16)   Status New     PT LONG TERM GOAL #2   Title Patient to demonstrate good postural alignment and postural arwareness with ability to self correct (12/27/16)   Status New     PT LONG TERM GOAL #3   Title Patient to report reduction in radicular symptoms by >/= 50% for greater than 2 weeks (12/27/16)   Status New     PT LONG TERM GOAL #4   Title Patient to improve tissue quality and joint mobility as noted by palpation and joint mobilizations (12/27/16)   Status New               Plan - 11/16/16 1009    Clinical Impression Statement pt tolerated all manual joint mobilization and TE well today.  stiffness present globally in the lower cspine and upper tspine especially at T4-5.  Reported feeling looser after treatment and foam roll supine release.  Needed review of nerve gliding but performing well after cueing   PT Frequency 2x / week   PT Duration 6 weeks   PT Treatment/Interventions ADLs/Self Care Home Management;Cryotherapy;Electrical Stimulation;Iontophoresis 4mg /ml Dexamethasone;Moist Heat;Traction;Ultrasound;Neuromuscular re-education;Therapeutic exercise;Therapeutic activities;Patient/family education;Manual techniques;Passive range of motion;Vasopneumatic Device;Taping;Dry needling   PT Next Visit Plan continue, tspine moblity/manual, cervical strengthening, nerve glides   Consulted and Agree with Plan of Care Patient      Patient will benefit from skilled therapeutic intervention in order to improve  the following deficits and impairments:  Pain, Impaired UE functional use, Decreased activity tolerance  Visit Diagnosis: Cervicalgia  Radiculopathy, cervicothoracic region  Abnormal posture     Problem List There are no active problems to display for this patient.   Stark Bray, DPT, CMP 11/16/2016, 10:13 AM  [facility] [address]  [place], [place], [address] Phone: [phone]   Fax:  [phone]  Name: Jean Hopkins MRN: [id number] Date of Birth: 1956-01-19

## 2016-11-21 ENCOUNTER — Ambulatory Visit: Payer: BLUE CROSS/BLUE SHIELD | Admitting: Physical Therapy

## 2016-11-21 DIAGNOSIS — M5413 Radiculopathy, cervicothoracic region: Secondary | ICD-10-CM

## 2016-11-21 DIAGNOSIS — R293 Abnormal posture: Secondary | ICD-10-CM

## 2016-11-21 DIAGNOSIS — M542 Cervicalgia: Secondary | ICD-10-CM

## 2016-11-21 NOTE — Therapy (Signed)
[facility] [address]  [place], [place], [address] Phone: [phone]   Fax:  [phone]  Physical Therapy Treatment  Patient Details  Name: Jean Hopkins MRN: [id number] Date of Birth: 1956-01-09 Referring Provider: Dr. Tania Ade  Encounter Date: 11/21/2016      PT End of Session - 11/21/16 0759    Visit Number 3   Number of Visits 12   Date for PT Re-Evaluation 12/27/16   PT Start Time 0757   Activity Tolerance Patient tolerated treatment well   Behavior During Therapy [facility] for tasks assessed/performed      No past medical history on file.  No past surgical history on file.  There were no vitals filed for this visit.      Subjective Assessment - 11/21/16 0758    Subjective has mild, dull ache in scapula - hardly noticeable. Friday was a bad day, but overall feels like PT is helping.    Diagnostic tests Xray - cervical spine - negative   Patient Stated Goals improve pain and N&T   Currently in Pain? Yes   Pain Score 1    Pain Location Scapula   Pain Orientation Left   Pain Descriptors / Indicators Aching                         [facility] Adult PT Treatment/Exercise - 11/21/16 0001      Neck Exercises: Machines for Strengthening   UBE (Upper Arm Bike) Level 2 - 3/3     Neck Exercises: Theraband   Shoulder External Rotation 15 reps;Red   Shoulder External Rotation Limitations hooklying on 1/2 foam roller   Horizontal ABduction 15 reps;Red   Horizontal ABduction Limitations hooklying on 1/2 foam roller   Other Theraband Exercises L diagonal flexion - red tband x 15 reps; hooklying on 1/2 foam roller     Neck Exercises: Seated   Other Seated Exercise shoulder horizontal abd - red tband with scap squeeze x 15 reps     Neck Exercises: Sidelying   Other Sidelying Exercise ER with scap squeeze - 4# x 15 reps   Other Sidelying Exercise PNF - resisted scapula x 15     Modalities    Modalities Moist Heat     Moist Heat Therapy   Number Minutes Moist Heat 10 Minutes   Moist Heat Location Shoulder;Cervical     Manual Therapy   Manual Therapy Joint mobilization;Soft tissue mobilization   Joint Mobilization CPAs grade III-IV from approx C5-T5 - approx 15-20 seconds each segment x 3   Soft tissue mobilization prone: to thoracic paraspinals and periscapular mm - some increased symptoms with deep pressure to infraspinatus     Neck Exercises: Stretches   Upper Trapezius Stretch 30 seconds;2 reps   Upper Trapezius Stretch Limitations Left only   Levator Stretch 30 seconds;2 reps   Levator Stretch Limitations Left only                     PT Long Term Goals - 11/21/16 0800      PT LONG TERM GOAL #1   Title Patient to be independent with advanced HEP (12/27/16)   Status On-going     PT LONG TERM GOAL #2   Title Patient to demonstrate good postural alignment and postural arwareness with ability to self correct (12/27/16)   Status On-going     PT LONG TERM GOAL #3  Title Patient to report reduction in radicular symptoms by >/= 50% for greater than 2 weeks (12/27/16)   Status On-going     PT LONG TERM GOAL #4   Title Patient to improve tissue quality and joint mobility as noted by palpation and joint mobilizations (12/27/16)   Status On-going               Plan - 11/21/16 0800    Clinical Impression Statement Patient doing well today with all strengthening progressions as well as STM and joint mobilizations. Patient does continue to report some increased symptoms with deep pressure to infrspinatus and teres group - however, soft tissue much more pliable  today as compared to initial eval.    PT Treatment/Interventions ADLs/Self Care Home Management;Cryotherapy;Electrical Stimulation;Iontophoresis 4mg /ml Dexamethasone;Moist Heat;Traction;Ultrasound;Neuromuscular re-education;Therapeutic exercise;Therapeutic activities;Patient/family education;Manual  techniques;Passive range of motion;Vasopneumatic Device;Taping;Dry needling   PT Next Visit Plan continue, tspine moblity/manual, cervical strengthening, nerve glides   Consulted and Agree with Plan of Care Patient      Patient will benefit from skilled therapeutic intervention in order to improve the following deficits and impairments:  Pain, Impaired UE functional use, Decreased activity tolerance  Visit Diagnosis: Cervicalgia  Radiculopathy, cervicothoracic region  Abnormal posture     Problem List There are no active problems to display for this patient.   Lanney Gins, PT, DPT 11/21/16 8:51 AM   [facility] [address]  [place], [place], [address] Phone: [phone]   Fax:  [phone]  Name: Jean Hopkins MRN: [id number] Date of Birth: 09/11/1955

## 2016-11-24 ENCOUNTER — Ambulatory Visit: Payer: BLUE CROSS/BLUE SHIELD | Attending: Orthopedic Surgery

## 2016-11-24 DIAGNOSIS — M5413 Radiculopathy, cervicothoracic region: Secondary | ICD-10-CM | POA: Insufficient documentation

## 2016-11-24 DIAGNOSIS — R293 Abnormal posture: Secondary | ICD-10-CM | POA: Insufficient documentation

## 2016-11-24 DIAGNOSIS — M542 Cervicalgia: Secondary | ICD-10-CM | POA: Insufficient documentation

## 2016-11-24 NOTE — Therapy (Signed)
[facility] [address]  [place], [place], [address] Phone: [phone]   Fax:  [phone]  Physical Therapy Treatment  Patient Details  Name: Jean Hopkins MRN: [id number] Date of Birth: 09-26-1955 Referring Provider: Dr. Tania Ade  Encounter Date: 11/24/2016      PT End of Session - 11/24/16 0806    Visit Number 4   Number of Visits 12   Date for PT Re-Evaluation 12/27/16   PT Start Time 0802   PT Stop Time 3500   PT Time Calculation (min) 55 min   Activity Tolerance Patient tolerated treatment well   Behavior During Therapy [facility] for tasks assessed/performed      No past medical history on file.  No past surgical history on file.  There were no vitals filed for this visit.      Subjective Assessment - 11/24/16 0829    Subjective Pt. doing well today.  Feels like PT is helping reduce frequency of pain and radicular symptoms.     Patient Stated Goals improve pain and N&T   Currently in Pain? Yes   Pain Score 1    Pain Location Scapula   Pain Orientation Left   Pain Descriptors / Indicators Aching;Dull   Pain Type Acute pain   Pain Onset 1 to 4 weeks ago   Pain Frequency Intermittent   Aggravating Factors  prolonged sitting, walking   Multiple Pain Sites No                         [facility] Adult PT Treatment/Exercise - 11/24/16 0817      Self-Care   Self-Care Other Self-Care Comments   Other Self-Care Comments  Review of technique with brachial plexus nerve glides     Neck Exercises: Machines for Strengthening   UBE (Upper Arm Bike) Level 2 - 3/3     Neck Exercises: Theraband   Shoulder External Rotation Red;20 reps   Shoulder External Rotation Limitations hooklying on 1/2 foam roller  cueing for scapular squeeze    Horizontal ABduction Red;20 reps  cueing for scapular squeeze   Horizontal ABduction Limitations hooklying on 1/2 foam roller     Moist Heat Therapy   Number Minutes Moist Heat 10 Minutes   Moist Heat Location Shoulder;Cervical     Manual Therapy   Manual Therapy Soft tissue mobilization;Myofascial release   Manual therapy comments Seated    Soft tissue mobilization STM to B UT, levator scap. to promote muscular relaxation   Myofascial Release TPR to L teres minor/major area due to palpable tone and ttp here as compared bilaterally; some relief noted     Neck Exercises: Stretches   Upper Trapezius Stretch 30 seconds;2 reps   Upper Trapezius Stretch Limitations Left only   Levator Stretch 30 seconds;2 reps   Levator Stretch Limitations Left only   Corner Stretch 3 reps;30 seconds  at doorseal                 PT Education - 11/24/16 1329    Education provided Yes   Education Details brachial nerve glide handout, 3-way doorway stretch for chest, levator scap. stretch   Person(s) Educated Patient   Methods Explanation;Demonstration;Verbal cues;Handout   Comprehension Verbalized understanding;Returned demonstration;Verbal cues required;Need further instruction             PT Long Term Goals - 11/21/16 0800      PT LONG TERM GOAL #1  Title Patient to be independent with advanced HEP (12/27/16)   Status On-going     PT LONG TERM GOAL #2   Title Patient to demonstrate good postural alignment and postural arwareness with ability to self correct (12/27/16)   Status On-going     PT LONG TERM GOAL #3   Title Patient to report reduction in radicular symptoms by >/= 50% for greater than 2 weeks (12/27/16)   Status On-going     PT LONG TERM GOAL #4   Title Patient to improve tissue quality and joint mobility as noted by palpation and joint mobilizations (12/27/16)   Status On-going               Plan - 11/24/16 3668    Clinical Impression Statement Pt. doing well today noting good relief from HEP activities.  Supine postural activities advanced on foam roll today and tolerated well.  Notable tone still palpable and  ttp in L teres minor/major areas today thus TPR to this area.  Pt. progressing well at this point with HEP updated to include chest and levator scap. stretches.  Will monitor response to updated HEP in coming visits.   PT Treatment/Interventions ADLs/Self Care Home Management;Cryotherapy;Electrical Stimulation;Iontophoresis 4mg /ml Dexamethasone;Moist Heat;Traction;Ultrasound;Neuromuscular re-education;Therapeutic exercise;Therapeutic activities;Patient/family education;Manual techniques;Passive range of motion;Vasopneumatic Device;Taping;Dry needling   PT Next Visit Plan continue, tspine moblity/manual, cervical strengthening, nerve glides      Patient will benefit from skilled therapeutic intervention in order to improve the following deficits and impairments:  Pain, Impaired UE functional use, Decreased activity tolerance  Visit Diagnosis: Cervicalgia  Radiculopathy, cervicothoracic region  Abnormal posture     Problem List There are no active problems to display for this patient.   Bess Harvest, PTA 11/24/16 1:34 PM  [facility] [address]  [place], [place], [address] Phone: [phone]   Fax:  [phone]  Name: Jean Hopkins MRN: [id number] Date of Birth: 1956-01-09

## 2016-11-27 ENCOUNTER — Ambulatory Visit: Payer: BLUE CROSS/BLUE SHIELD

## 2016-11-27 DIAGNOSIS — M542 Cervicalgia: Secondary | ICD-10-CM

## 2016-11-27 DIAGNOSIS — R293 Abnormal posture: Secondary | ICD-10-CM

## 2016-11-27 DIAGNOSIS — M5413 Radiculopathy, cervicothoracic region: Secondary | ICD-10-CM

## 2016-11-27 NOTE — Therapy (Signed)
[facility] [address]  [place], [place], [address] Phone: [phone]   Fax:  [phone]  Physical Therapy Treatment  Patient Details  Name: Jean BATTEY MRN: [id number] Date of Birth: [birth year] Referring Provider: Dr. Tania Ade  Encounter Date: 11/27/2016      PT End of Session - 11/27/16 0809    Visit Number 5   Number of Visits 12   Date for PT Re-Evaluation 12/27/16   PT Start Time 0802   PT Stop Time 4854   PT Time Calculation (min) 45 min   Activity Tolerance Patient tolerated treatment well   Behavior During Therapy [facility] for tasks assessed/performed      No past medical history on file.  No past surgical history on file.  There were no vitals filed for this visit.      Subjective Assessment - 11/27/16 0808    Subjective Less frequent radicular symptoms over weekend.     Patient Stated Goals improve pain and N&T   Currently in Pain? No/denies   Pain Score 0-No pain   Multiple Pain Sites No                         [facility] Adult PT Treatment/Exercise - 11/27/16 0811      Self-Care   Self-Care Other Self-Care Comments   Other Self-Care Comments  Instruction on self-ball massage in L sidelying to L teres minor; good response with this     Neck Exercises: Machines for Strengthening   UBE (Upper Arm Bike) Level 2 - 3/3 min    Cybex Row 20# x 15 reps 3" hold      Neck Exercises: Theraband   Shoulder External Rotation Green;10 reps   Shoulder External Rotation Limitations hooklying on 1/2 foam roller   Horizontal ABduction 10 reps;Green   Horizontal ABduction Limitations hooklying on 1/2 foam roller   Other Theraband Exercises Alternating shoulder flexion/ext. with green TB (x-pattern) x 10 reps each side; laying on 1/2 foam bolster      Neck Exercises: Supine   Neck Retraction 10 reps;5 secs   Neck Retraction Limitations laying on 1/2 foam bolster      Shoulder  Exercises: Standing   Extension Both;15 reps;Strengthening;Theraband   Theraband Level (Shoulder Extension) Level 2 (Red)   Row Both;15 reps;Theraband  3" hold    Theraband Level (Shoulder Row) Level 3 (Green)     Manual Therapy   Manual Therapy Myofascial release   Manual therapy comments R sidelying   Myofascial Release TPR to L teres minor area; palpable muscular "knotting" here     Neck Exercises: Stretches   Upper Trapezius Stretch 30 seconds;2 reps   Upper Trapezius Stretch Limitations Left only   Levator Stretch 30 seconds;2 reps   Levator Stretch Limitations Left only   Corner Stretch 3 reps;30 seconds   Chest Stretch 1 rep;60 seconds  laying on 1/2 foam bolster in cross position    Other Neck Stretches Seated L posterior shoulder stretch x 60 sec                PT Education - 11/27/16 0913    Education provided Yes   Education Details row with green, shoulder extension with red TB, teres minor/infraspinatus self-ball massage    Person(s) Educated Patient   Methods Explanation;Demonstration;Verbal cues;Handout   Comprehension Verbalized understanding;Returned demonstration;Verbal cues required;Need further instruction  PT Long Term Goals - 11/21/16 0800      PT LONG TERM GOAL #1   Title Patient to be independent with advanced HEP (12/27/16)   Status On-going     PT LONG TERM GOAL #2   Title Patient to demonstrate good postural alignment and postural arwareness with ability to self correct (12/27/16)   Status On-going     PT LONG TERM GOAL #3   Title Patient to report reduction in radicular symptoms by >/= 50% for greater than 2 weeks (12/27/16)   Status On-going     PT LONG TERM GOAL #4   Title Patient to improve tissue quality and joint mobility as noted by palpation and joint mobilizations (12/27/16)   Status On-going               Plan - 11/27/16 0810    Clinical Impression Statement Pt. doing well today and feels PT has lessened  frequency of her pain and radicular symptoms.  Scapular strengthening progressed today and tolerated well.  HEP updated to include more scapular strengthening activity.  Continued TPR to L Teres Minor/Infraspinatus area due to tenderness and palpable tone here.  Limited response with TPR may consider DN to this area in future.  HEP updated with self-ball massage to this area.  Pt. progressing well.   PT Treatment/Interventions ADLs/Self Care Home Management;Cryotherapy;Electrical Stimulation;Iontophoresis 4mg /ml Dexamethasone;Moist Heat;Traction;Ultrasound;Neuromuscular re-education;Therapeutic exercise;Therapeutic activities;Patient/family education;Manual techniques;Passive range of motion;Vasopneumatic Device;Taping;Dry needling   PT Next Visit Plan continue, tspine moblity/manual, cervical strengthening, nerve glides      Patient will benefit from skilled therapeutic intervention in order to improve the following deficits and impairments:  Pain, Impaired UE functional use, Decreased activity tolerance  Visit Diagnosis: Cervicalgia  Radiculopathy, cervicothoracic region  Abnormal posture     Problem List There are no active problems to display for this patient.   Bess Harvest, PTA 11/27/16 9:25 AM  [facility] [address]  [place], [place], [address] Phone: [phone]   Fax:  [phone]  Name: MELANIE OPENSHAW MRN: [id number] Date of Birth: 1956-04-03

## 2016-12-01 ENCOUNTER — Ambulatory Visit: Payer: BLUE CROSS/BLUE SHIELD | Admitting: Physical Therapy

## 2016-12-01 DIAGNOSIS — M542 Cervicalgia: Secondary | ICD-10-CM

## 2016-12-01 DIAGNOSIS — R293 Abnormal posture: Secondary | ICD-10-CM

## 2016-12-01 DIAGNOSIS — M5413 Radiculopathy, cervicothoracic region: Secondary | ICD-10-CM | POA: Diagnosis not present

## 2016-12-01 NOTE — Therapy (Signed)
[facility] [address]  [place], [place], [address] Phone: [phone]   Fax:  [phone]  Physical Therapy Treatment  Patient Details  Name: Jean Hopkins MRN: [id number] Date of Birth: Jul 06, 1955 Referring Provider: Dr. Tania Ade  Encounter Date: 12/01/2016      PT End of Session - 12/01/16 0832    Visit Number 6   Number of Visits 12   Date for PT Re-Evaluation 12/27/16   PT Start Time 0758   PT Stop Time 0854  moist heat at end of session   PT Time Calculation (min) 56 min   Activity Tolerance Patient tolerated treatment well   Behavior During Therapy [facility] for tasks assessed/performed      No past medical history on file.  No past surgical history on file.  There were no vitals filed for this visit.      Subjective Assessment - 12/01/16 0805    Subjective YEsterday was a bad day - doesn't know what increased pain, doing better this morning   Diagnostic tests Xray - cervical spine - negative   Patient Stated Goals improve pain and N&T   Currently in Pain? Yes   Pain Score 2    Pain Location Scapula   Pain Orientation Left   Pain Descriptors / Indicators Aching;Tingling   Pain Type Acute pain                         [facility] Adult PT Treatment/Exercise - 12/01/16 0001      Neck Exercises: Machines for Strengthening   UBE (Upper Arm Bike) Level 3.5 x 6 min (3/3)     Neck Exercises: Theraband   Shoulder External Rotation 15 reps;Green   Shoulder External Rotation Limitations hooklying on pool noodle with scap squeeze   Horizontal ABduction 15 reps;Green   Horizontal ABduction Limitations hooklying on pool noodle with scap squeeze   Other Theraband Exercises IR standing green tband IR.      Moist Heat Therapy   Number Minutes Moist Heat 10 Minutes   Moist Heat Location Shoulder;Cervical     Manual Therapy   Manual Therapy Soft tissue mobilization;Myofascial release   Manual therapy comments patient prone   Soft tissue mobilization deep STM to L infraspinatus   Myofascial Release manual trigger point release to infrapsinatus          Trigger Point Dry Needling - 12/01/16 0842    Consent Given? Yes   Education Handout Provided Yes   Muscles Treated Upper Body Infraspinatus   Infraspinatus Response Palpable increased muscle length;Twitch response elicited              PT Education - 12/01/16 0832    Education provided Yes   Education Details Dry needling - risks vs benefits, rationale for treatment   Person(s) Educated Patient   Methods Explanation;Demonstration;Handout   Comprehension Verbalized understanding;Returned demonstration             PT Long Term Goals - 11/21/16 0800      PT LONG TERM GOAL #1   Title Patient to be independent with advanced HEP (12/27/16)   Status On-going     PT LONG TERM GOAL #2   Title Patient to demonstrate good postural alignment and postural arwareness with ability to self correct (12/27/16)   Status On-going     PT LONG TERM GOAL #3   Title Patient to report reduction in radicular symptoms by >/=  50% for greater than 2 weeks (12/27/16)   Status On-going     PT LONG TERM GOAL #4   Title Patient to improve tissue quality and joint mobility as noted by palpation and joint mobilizations (12/27/16)   Status On-going               Plan - 12/01/16 0832    Clinical Impression Statement [name] doing well today - report sof increaed symptoms yesterday and while walkingthis morning - has since calmed down. Patient and PT discussing dry needling techniques today with patient willing to proceed with this treatment. Review of all precautions as well as symptoms for pneumothorax and to present immediately to ED as needling took place in infrapsinatus. Patient responding well to treatment with twitch response ellicited. Will continue to monitor repsonse to treatment and progress where appropriate.    PT  Treatment/Interventions ADLs/Self Care Home Management;Cryotherapy;Electrical Stimulation;Iontophoresis 4mg /ml Dexamethasone;Moist Heat;Traction;Ultrasound;Neuromuscular re-education;Therapeutic exercise;Therapeutic activities;Patient/family education;Manual techniques;Passive range of motion;Vasopneumatic Device;Taping;Dry needling   PT Next Visit Plan continue, tspine moblity/manual, cervical strengthening, nerve glides, DN as appropriate   Consulted and Agree with Plan of Care Patient      Patient will benefit from skilled therapeutic intervention in order to improve the following deficits and impairments:  Pain, Impaired UE functional use, Decreased activity tolerance  Visit Diagnosis: Cervicalgia  Radiculopathy, cervicothoracic region  Abnormal posture     Problem List There are no active problems to display for this patient.    Lanney Gins, PT, DPT 12/01/16 8:56 AM   [facility] [address]  [place], [place], [address] Phone: [phone]   Fax:  [phone]  Name: Jean Hopkins MRN: [id number] Date of Birth: 1956/02/23

## 2016-12-01 NOTE — Patient Instructions (Signed)

## 2016-12-04 ENCOUNTER — Ambulatory Visit: Payer: BLUE CROSS/BLUE SHIELD

## 2016-12-04 DIAGNOSIS — R293 Abnormal posture: Secondary | ICD-10-CM | POA: Diagnosis not present

## 2016-12-04 DIAGNOSIS — M5413 Radiculopathy, cervicothoracic region: Secondary | ICD-10-CM | POA: Diagnosis not present

## 2016-12-04 DIAGNOSIS — M542 Cervicalgia: Secondary | ICD-10-CM | POA: Diagnosis not present

## 2016-12-04 NOTE — Therapy (Signed)
[facility] [place] [address]  [place], [place], [address] Phone: [phone]   Fax:  [phone]  Physical Therapy Treatment  Patient Details  Name: Jean Hopkins MRN: [id number] Date of Birth: Feb 13, 1956 Referring Provider: Dr. Tania Ade  Encounter Date: 12/04/2016      PT End of Session - 12/04/16 0802    Visit Number 7   Number of Visits 12   Date for PT Re-Evaluation 12/27/16   PT Start Time 0759   PT Stop Time 0848  10 min moist heat to end treatment    PT Time Calculation (min) 49 min   Activity Tolerance Patient tolerated treatment well   Behavior During Therapy [facility] for tasks assessed/performed      No past medical history on file.  No past surgical history on file.  There were no vitals filed for this visit.      Subjective Assessment - 12/04/16 0801    Subjective Pt. noting she feels she has responded well to DN last treatment with soreness subsiding Saturday over weekend.     Patient Stated Goals improve pain and N&T   Currently in Pain? Yes   Pain Score 1    Pain Location Shoulder   Pain Orientation Left   Pain Descriptors / Indicators Aching   Multiple Pain Sites No                         [facility] Adult PT Treatment/Exercise - 12/04/16 0810      Neck Exercises: Machines for Strengthening   UBE (Upper Arm Bike) Level 3.5 x 6 min (3/3)     Neck Exercises: Theraband   Shoulder External Rotation 15 reps;Green   Shoulder External Rotation Limitations hooklying on pool noodle with scap squeeze   Horizontal ABduction 15 reps;Green   Horizontal ABduction Limitations hooklying on 6" bolster    Other Theraband Exercises Alternating shoulder flexion/ext. with green TB laying on 6" bolster x 15 reps each side     Neck Exercises: Supine   Neck Retraction 10 reps;5 secs   Neck Retraction Limitations Leaning on 6" bolster on wall      Neck Exercises: Sidelying   Other Sidelying  Exercise L sidelying L shoulder sleeper stretch 2 x 30 sec      Shoulder Exercises: Standing   External Rotation Left;15 reps;Theraband   Theraband Level (Shoulder External Rotation) Level 2 (Red)   Internal Rotation Left;15 reps;Theraband   Theraband Level (Shoulder Internal Rotation) Level 2 (Red)   Internal Rotation Limitations with towel      Moist Heat Therapy   Number Minutes Moist Heat 10 Minutes   Moist Heat Location Shoulder     Manual Therapy   Manual Therapy Soft tissue mobilization;Myofascial release   Manual therapy comments R sidelying    Soft tissue mobilization STM to L infraspinatus area   Myofascial Release TPR to L infraspinatus area; palpable tension here; pt. confirming sensitivity here     Neck Exercises: Stretches   Corner Stretch 3 reps;30 seconds  doorway stretch    Chest Stretch 1 rep;60 seconds  laying on 6" bolster     Other Neck Stretches Seated L posterior shoulder stretch x 60 sec                PT Education - 12/04/16 0818    Education provided Yes   Education Details sleeper stretch, IR towel stretch    Person(s)  Educated Patient   Methods Explanation;Demonstration;Verbal cues;Handout   Comprehension Verbalized understanding;Returned demonstration;Verbal cues required;Need further instruction             PT Long Term Goals - 11/21/16 0800      PT LONG TERM GOAL #1   Title Patient to be independent with advanced HEP (12/27/16)   Status On-going     PT LONG TERM GOAL #2   Title Patient to demonstrate good postural alignment and postural arwareness with ability to self correct (12/27/16)   Status On-going     PT LONG TERM GOAL #3   Title Patient to report reduction in radicular symptoms by >/= 50% for greater than 2 weeks (12/27/16)   Status On-going     PT LONG TERM GOAL #4   Title Patient to improve tissue quality and joint mobility as noted by palpation and joint mobilizations (12/27/16)   Status On-going                Plan - 12/04/16 0802    Clinical Impression Statement Pt. doing well today noting good benefit from DN last treatment with soreness subsiding in posterior shoulder on Saturday.  Pt. noting she would be open to trying DN again for continued benefit in coming visits.  Some tone still present with palpation to L infraspinatus area thus TPR and stretching added to HEP.  Pt. tolerated progression in scapular strengthening well today and seems to be progressing with therapy well at this point.  Notes pain seems to bother her most when, "hanging arms by side".   PT Treatment/Interventions ADLs/Self Care Home Management;Cryotherapy;Electrical Stimulation;Iontophoresis 4mg /ml Dexamethasone;Moist Heat;Traction;Ultrasound;Neuromuscular re-education;Therapeutic exercise;Therapeutic activities;Patient/family education;Manual techniques;Passive range of motion;Vasopneumatic Device;Taping;Dry needling   PT Next Visit Plan DN as appropriate; tspine moblity/manual, cervical strengthening, nerve glides      Patient will benefit from skilled therapeutic intervention in order to improve the following deficits and impairments:  Pain, Impaired UE functional use, Decreased activity tolerance  Visit Diagnosis: Cervicalgia  Radiculopathy, cervicothoracic region  Abnormal posture     Problem List There are no active problems to display for this patient.   Bess Harvest, PTA 12/04/16 2:54 PM  [facility] [place] [address]  [place], [place], [address] Phone: [phone]   Fax:  [phone]  Name: Jean Hopkins MRN: [id number] Date of Birth: [birth year]

## 2016-12-08 ENCOUNTER — Ambulatory Visit: Payer: BLUE CROSS/BLUE SHIELD | Admitting: Physical Therapy

## 2016-12-08 DIAGNOSIS — M5413 Radiculopathy, cervicothoracic region: Secondary | ICD-10-CM | POA: Diagnosis not present

## 2016-12-08 DIAGNOSIS — R293 Abnormal posture: Secondary | ICD-10-CM | POA: Diagnosis not present

## 2016-12-08 DIAGNOSIS — M542 Cervicalgia: Secondary | ICD-10-CM | POA: Diagnosis not present

## 2016-12-08 NOTE — Therapy (Signed)
[facility] [place] [facility] [address]  [place], [place], [address] Phone: [phone]   Fax:  [phone]  Physical Therapy Treatment  Patient Details  Name: Jean Hopkins MRN: [id number] Date of Birth: November 25, 1955 Referring Provider: Dr. Tania Ade  Encounter Date: 12/08/2016      PT End of Session - 12/08/16 0803    Visit Number 8   Number of Visits 12   Date for PT Re-Evaluation 12/27/16   PT Start Time 0801   PT Stop Time 0852  moist heat at end of session   PT Time Calculation (min) 51 min   Activity Tolerance Patient tolerated treatment well   Behavior During Therapy [facility] for tasks assessed/performed      No past medical history on file.  No past surgical history on file.  There were no vitals filed for this visit.      Subjective Assessment - 12/08/16 0801    Subjective Patient reports resolution of burning into scapula - now only has the intermittent aching and tingling into L UE.    Diagnostic tests Xray - cervical spine - negative   Patient Stated Goals improve pain and N&T   Currently in Pain? No/denies   Pain Score 0-No pain                         [facility] Adult PT Treatment/Exercise - 12/08/16 0809      Neck Exercises: Machines for Strengthening   UBE (Upper Arm Bike) L4 x 6 minutes (3/3)   Cybex Row 25# - 15 reps with 3-5 sec hold     Neck Exercises: Theraband   Other Theraband Exercises 4-way resisted L UE PNF patterns x 15 each - red tband     Moist Heat Therapy   Number Minutes Moist Heat 10 Minutes   Moist Heat Location Shoulder     Manual Therapy   Manual Therapy Soft tissue mobilization;Joint mobilization   Manual therapy comments patient prone   Joint Mobilization CPAs grade III-IV from approx C5-T5 - approx 15-20 seconds each segment x 3   Soft tissue mobilization STM to L posterior shoulder complex          Trigger Point Dry Needling - 12/08/16 0823    Consent  Given? Yes   Muscles Treated Upper Body Infraspinatus   Infraspinatus Response Twitch response elicited;Palpable increased muscle length                   PT Long Term Goals - 11/21/16 0800      PT LONG TERM GOAL #1   Title Patient to be independent with advanced HEP (12/27/16)   Status On-going     PT LONG TERM GOAL #2   Title Patient to demonstrate good postural alignment and postural arwareness with ability to self correct (12/27/16)   Status On-going     PT LONG TERM GOAL #3   Title Patient to report reduction in radicular symptoms by >/= 50% for greater than 2 weeks (12/27/16)   Status On-going     PT LONG TERM GOAL #4   Title Patient to improve tissue quality and joint mobility as noted by palpation and joint mobilizations (12/27/16)   Status On-going               Plan - 12/08/16 0805    Clinical Impression Statement Patient and PT today discussing continuing DN treatment today, as she feels it  made a huge difference with scapular burning now completely resolved, with only intermittent achiness and tingling into L UE. DN to lateral infraspinatus today with twtich response ellicited and symptom provocation. Review of signs and symtpoms of pneumothorax as well as to expect some soreness this afternoon with good carryover.    PT Treatment/Interventions ADLs/Self Care Home Management;Cryotherapy;Electrical Stimulation;Iontophoresis 4mg /ml Dexamethasone;Moist Heat;Traction;Ultrasound;Neuromuscular re-education;Therapeutic exercise;Therapeutic activities;Patient/family education;Manual techniques;Passive range of motion;Vasopneumatic Device;Taping;Dry needling   PT Next Visit Plan DN as appropriate; tspine moblity/manual, cervical strengthening, nerve glides   Consulted and Agree with Plan of Care Patient      Patient will benefit from skilled therapeutic intervention in order to improve the following deficits and impairments:  Pain, Impaired UE functional use, Decreased  activity tolerance  Visit Diagnosis: Cervicalgia  Radiculopathy, cervicothoracic region  Abnormal posture     Problem List There are no active problems to display for this patient.   Lanney Gins, PT, DPT 12/08/16 8:53 AM   [facility] [address]  [place], [place], [address] Phone: [phone]   Fax:  [phone]  Name: DESERAI CANSLER MRN: [id number] Date of Birth: 1955-12-24

## 2016-12-12 ENCOUNTER — Ambulatory Visit: Payer: BLUE CROSS/BLUE SHIELD | Admitting: Physical Therapy

## 2016-12-12 DIAGNOSIS — R293 Abnormal posture: Secondary | ICD-10-CM | POA: Diagnosis not present

## 2016-12-12 DIAGNOSIS — M542 Cervicalgia: Secondary | ICD-10-CM | POA: Diagnosis not present

## 2016-12-12 DIAGNOSIS — M5413 Radiculopathy, cervicothoracic region: Secondary | ICD-10-CM | POA: Diagnosis not present

## 2016-12-12 NOTE — Therapy (Addendum)
[facility] [place] [address]  [place], [place], [address] Phone: [phone]   Fax:  [phone]  Physical Therapy Treatment  Patient Details  Name: Jean Hopkins MRN: [id number] Date of Birth: 02-10-1956 Referring Provider: Dr. Tania Ade  Encounter Date: 12/12/2016      PT End of Session - 12/12/16 0802    Visit Number 9   Number of Visits 12   Date for PT Re-Evaluation 12/27/16   PT Start Time 0758   PT Stop Time 0823   PT Time Calculation (min) 25 min   Activity Tolerance Patient tolerated treatment well   Behavior During Therapy [facility] for tasks assessed/performed      No past medical history on file.  No past surgical history on file.  There were no vitals filed for this visit.      Subjective Assessment - 12/12/16 0800    Subjective Near complete resoluton of symtpoms - only has some achiness with "gravity" - during walking.    Diagnostic tests Xray - cervical spine - negative   Patient Stated Goals improve pain and N&T   Currently in Pain? No/denies   Pain Score 0-No pain            [facility] PT Assessment - 12/12/16 0001      Observation/Other Assessments   Focus on Therapeutic Outcomes (FOTO)  Neck: 82 (18% limited)                     [facility] Adult PT Treatment/Exercise - 12/12/16 0804      Neck Exercises: Machines for Strengthening   UBE (Upper Arm Bike) L4 x 6 minutes (3/3)     Neck Exercises: Theraband   Other Theraband Exercises 4-way resisted L UE PNF patterns x 15 each - green tband     Neck Exercises: Standing   Neck Retraction 10 reps   Other Standing Exercises scap retraction - x 10 reps     Neck Exercises: Stretches   Levator Stretch Limitations bilateral - doorway - 3 way (low/mid/high)   Corner Stretch 3 reps;30 seconds                     PT Long Term Goals - 12/12/16 0802      PT LONG TERM GOAL #1   Title Patient to be independent with  advanced HEP (12/27/16)   Status Achieved     PT LONG TERM GOAL #2   Title Patient to demonstrate good postural alignment and postural arwareness with ability to self correct (12/27/16)   Status Achieved     PT LONG TERM GOAL #3   Title Patient to report reduction in radicular symptoms by >/= 50% for greater than 2 weeks (12/27/16)   Status Achieved     PT LONG TERM GOAL #4   Title Patient to improve tissue quality and joint mobility as noted by palpation and joint mobilizations (12/27/16)   Status Achieved               Plan - 12/12/16 0803    Clinical Impression Statement Chene today reporting near complete resolution of symtpoms since last treatment with dry needling incorporated. Patient reporting no longer having any tenderness with self-massage to infraspinatus, no longer having any burning in the scapular region, as well as very minimal aching in L UE when walking. Signs and symptoms of pain have been consistent with trigger points noted in infraspinatus with much  resolution with manual therapy and dry needling. Patient has been very compliant with HEP and is able to transition to independent HEP practice. All goals met at this point with education to return to PT if symptoms increase with good carryover.    PT Treatment/Interventions ADLs/Self Care Home Management;Cryotherapy;Electrical Stimulation;Iontophoresis 4mg/ml Dexamethasone;Moist Heat;Traction;Ultrasound;Neuromuscular re-education;Therapeutic exercise;Therapeutic activities;Patient/family education;Manual techniques;Passive range of motion;Vasopneumatic Device;Taping;Dry needling   PT Next Visit Plan 30 day hold   Consulted and Agree with Plan of Care Patient      Patient will benefit from skilled therapeutic intervention in order to improve the following deficits and impairments:  Pain, Impaired UE functional use, Decreased activity tolerance  Visit Diagnosis: Cervicalgia  Radiculopathy, cervicothoracic  region  Abnormal posture     Problem List There are no active problems to display for this patient.    Stephanie R Aaron, PT, DPT 12/12/16 8:38 AM   PHYSICAL THERAPY DISCHARGE SUMMARY  Visits from Start of Care: 9  Current functional level related to goals / functional outcomes: See above   Remaining deficits: See above   Education / Equipment: HEP  Plan: Patient agrees to discharge.  Patient goals were met. Patient is being discharged due to meeting the stated rehab goals.  ?????     Stephanie R Aaron, PT, DPT 01/15/17 9:46 AM   [facility] [place] [address] [place] [address]  [address] [place], [place], [address] Phone: [phone]   Fax:  [phone]  Name: Jean Hopkins MRN: [id number] Date of Birth: 07/16/1955   

## 2016-12-15 ENCOUNTER — Ambulatory Visit: Payer: BLUE CROSS/BLUE SHIELD | Admitting: Physical Therapy

## 2017-01-12 DIAGNOSIS — H2513 Age-related nuclear cataract, bilateral: Secondary | ICD-10-CM | POA: Diagnosis not present

## 2017-01-12 DIAGNOSIS — H5213 Myopia, bilateral: Secondary | ICD-10-CM | POA: Diagnosis not present

## 2017-01-12 DIAGNOSIS — H52203 Unspecified astigmatism, bilateral: Secondary | ICD-10-CM | POA: Diagnosis not present

## 2017-01-22 DIAGNOSIS — D2271 Melanocytic nevi of right lower limb, including hip: Secondary | ICD-10-CM | POA: Diagnosis not present

## 2017-01-22 DIAGNOSIS — Z85820 Personal history of malignant melanoma of skin: Secondary | ICD-10-CM | POA: Diagnosis not present

## 2017-01-22 DIAGNOSIS — D225 Melanocytic nevi of trunk: Secondary | ICD-10-CM | POA: Diagnosis not present

## 2017-01-22 DIAGNOSIS — D2261 Melanocytic nevi of right upper limb, including shoulder: Secondary | ICD-10-CM | POA: Diagnosis not present

## 2017-03-06 ENCOUNTER — Other Ambulatory Visit: Payer: Self-pay | Admitting: Family Medicine

## 2017-03-06 DIAGNOSIS — Z1231 Encounter for screening mammogram for malignant neoplasm of breast: Secondary | ICD-10-CM

## 2017-03-27 DIAGNOSIS — Z23 Encounter for immunization: Secondary | ICD-10-CM | POA: Diagnosis not present

## 2017-04-16 ENCOUNTER — Ambulatory Visit: Payer: 59

## 2017-05-01 ENCOUNTER — Ambulatory Visit
Admission: RE | Admit: 2017-05-01 | Discharge: 2017-05-01 | Disposition: A | Discharge status: Home / Self Care | Payer: 59 | Source: Ambulatory Visit | Attending: Family Medicine | Admitting: Family Medicine

## 2017-05-01 DIAGNOSIS — Z1231 Encounter for screening mammogram for malignant neoplasm of breast: Secondary | ICD-10-CM

## 2017-06-07 DIAGNOSIS — E559 Vitamin D deficiency, unspecified: Secondary | ICD-10-CM | POA: Diagnosis not present

## 2017-06-07 DIAGNOSIS — I10 Essential (primary) hypertension: Secondary | ICD-10-CM | POA: Diagnosis not present

## 2017-06-07 DIAGNOSIS — E119 Type 2 diabetes mellitus without complications: Secondary | ICD-10-CM | POA: Diagnosis not present

## 2017-06-08 DIAGNOSIS — Z0000 Encounter for general adult medical examination without abnormal findings: Secondary | ICD-10-CM | POA: Diagnosis not present

## 2017-07-29 ENCOUNTER — Emergency Department (HOSPITAL_COMMUNITY): Payer: BLUE CROSS/BLUE SHIELD

## 2017-07-29 ENCOUNTER — Other Ambulatory Visit: Payer: Self-pay

## 2017-07-29 ENCOUNTER — Emergency Department (HOSPITAL_COMMUNITY)
Admission: EM | Admit: 2017-07-29 | Discharge: 2017-07-29 | Disposition: A | Discharge status: Home / Self Care | Payer: BLUE CROSS/BLUE SHIELD | Attending: Emergency Medicine | Admitting: Emergency Medicine

## 2017-07-29 DIAGNOSIS — R0602 Shortness of breath: Secondary | ICD-10-CM | POA: Diagnosis present

## 2017-07-29 DIAGNOSIS — R002 Palpitations: Secondary | ICD-10-CM | POA: Diagnosis not present

## 2017-07-29 DIAGNOSIS — R000 Tachycardia, unspecified: Secondary | ICD-10-CM | POA: Diagnosis not present

## 2017-07-29 DIAGNOSIS — I4892 Unspecified atrial flutter: Secondary | ICD-10-CM | POA: Diagnosis not present

## 2017-07-29 DIAGNOSIS — I4891 Unspecified atrial fibrillation: Secondary | ICD-10-CM | POA: Insufficient documentation

## 2017-07-29 LAB — BASIC METABOLIC PANEL
Anion gap: 10 (ref 5–15)
BUN: 18 mg/dL (ref 6–20)
CALCIUM: 9.1 mg/dL (ref 8.9–10.3)
CO2: 24 mmol/L (ref 22–32)
CREATININE: 0.58 mg/dL (ref 0.44–1.00)
Chloride: 106 mmol/L (ref 101–111)
GFR calc Af Amer: >60 mL/min (ref >60)
GLUCOSE: 123 mg/dL — AB (ref 65–99)
Potassium: 3.8 mmol/L (ref 3.5–5.1)
Sodium: 140 mmol/L (ref 135–145)

## 2017-07-29 LAB — CBC
HCT: 45.8 % (ref 36.0–46.0)
Hemoglobin: 14.8 g/dL (ref 12.0–15.0)
MCH: 26.5 pg (ref 26.0–34.0)
MCHC: 32.3 g/dL (ref 30.0–36.0)
MCV: 81.9 fL (ref 78.0–100.0)
Platelets: 273 10*3/uL (ref 150–400)
RBC: 5.59 MIL/uL — ABNORMAL HIGH (ref 3.87–5.11)
RDW: 13.4 % (ref 11.5–15.5)
WBC: 6.8 10*3/uL (ref 4.0–10.5)

## 2017-07-29 LAB — TSH: TSH: 0 u[IU]/mL — ABNORMAL LOW (ref 0.350–4.500)

## 2017-07-29 LAB — MAGNESIUM: Magnesium: 1.8 mg/dL (ref 1.7–2.4)

## 2017-07-29 MED ORDER — APIXABAN 5 MG PO TABS: 5.0000 mg | ORAL_TABLET | Freq: Two times a day (BID) | ORAL | 1 refills | Status: DC

## 2017-07-29 MED ORDER — DILTIAZEM HCL ER COATED BEADS 240 MG PO CP24
240.0000 mg | ORAL_CAPSULE | Freq: Every day | ORAL | Status: DC
  Administered 2017-07-29: 240 mg via ORAL
  Filled 2017-07-29: qty 1

## 2017-07-29 MED ORDER — APIXABAN 5 MG PO TABS
5.0000 mg | ORAL_TABLET | Freq: Two times a day (BID) | ORAL | Status: DC
  Administered 2017-07-29: 5 mg via ORAL
  Filled 2017-07-29 (×3): qty 1

## 2017-07-29 MED ORDER — DILTIAZEM HCL ER COATED BEADS 240 MG PO CP24: 240.0000 mg | ORAL_CAPSULE | Freq: Every day | ORAL | 0 refills | Status: DC

## 2017-07-29 MED ORDER — METOPROLOL TARTRATE 5 MG/5ML IV SOLN: 5.0000 mg | INTRAVENOUS | Status: DC | PRN

## 2017-07-29 NOTE — ED Triage Notes (Signed)
Pt arrives to treatment room from triage due to elevated heart rate and abnormal EKG. Pt states she has been feeling abnormal for over a week intermittently that seemed to get worse today. Pt appears to be in A flutter rate between 100-140.

## 2017-07-29 NOTE — ED Provider Notes (Signed)
[facility] EMERGENCY DEPARTMENT Provider Note   CSN: [id number] Arrival date & time: 07/29/17  1214     History   Chief Complaint Chief Complaint  Patient presents with  . Chest Pain  . Shortness of Breath    HPI Jean Hopkins is a 62 y.o. female.  HPI Patient presents to the emergency room for evaluation of palpitations.  Patient states she has been having intermittent episodes over the last week or so feeling her heart racing.  Most of the time the symptoms will resolve on their own so she did not think too much of it.  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  Today she continued to have the symptoms.  Her daughter who also happens to be a nurse convinced her to actually get this checked.  She was able to check her heart rate and noted that it was in the 140s so she came to the ED for evaluation.  Patient denies any chest pain or shortness of breath.  She denies any leg swelling.  No history of heart disease.  No history of thyroid disease.  No frequent alcohol use.  CHadsVasc2 score =1   No past medical history on file.  There are no active problems to display for this patient.   No past surgical history on file.  OB History    No data available       Home Medications    Prior to Admission medications   Medication Sig Start Date End Date Taking? Authorizing Provider  Cholecalciferol (VITAMIN D PO) Take 1 tablet by mouth daily.   Yes [provider]    Family History No family history on file.  Social History Social History   Tobacco Use  . Smoking status: Not on file  Substance Use Topics  . Alcohol use: Not on file  . Drug use: Not on file     Allergies   Patient has no known allergies.   Review of Systems Review of Systems  All other systems reviewed and are negative.    Physical Exam Updated Vital Signs BP 133/70   Pulse 97   Temp 98.3 F (36.8 C) (Oral)   Resp 18   Ht 1.626 m (5\' 4" )   Wt  77.1 kg (170 lb)   SpO2 100%   BMI 29.18 kg/m   Physical Exam  Constitutional: She appears well-developed and well-nourished. No distress.  HENT:  Head: Normocephalic and atraumatic.  Right Ear: External ear normal.  Left Ear: External ear normal.  Eyes: Conjunctivae are normal. Right eye exhibits no discharge. Left eye exhibits no discharge. No scleral icterus.  Neck: Neck supple. No tracheal deviation present.  Cardiovascular: Intact distal pulses. An irregularly irregular rhythm present. Tachycardia present.  Pulmonary/Chest: Effort normal and breath sounds normal. No stridor. No respiratory distress. She has no wheezes. She has no rales.  Abdominal: Soft. Bowel sounds are normal. She exhibits no distension. There is no tenderness. There is no rebound and no guarding.  Musculoskeletal: She exhibits no edema or tenderness.  Neurological: She is alert. She has normal strength. No cranial nerve deficit (no facial droop, extraocular movements intact, no slurred speech) or sensory deficit. She exhibits normal muscle tone. She displays no seizure activity. Coordination normal.  Skin: Skin is warm and dry. No rash noted.  Psychiatric: She has a normal mood and affect.  Nursing note and vitals reviewed.    ED Treatments / Results  Labs (  all labs ordered are listed, but only abnormal results are displayed) Labs Reviewed  BASIC METABOLIC PANEL - Abnormal; Notable for the following components:      Result Value   Glucose, Bld 123 (*)    All other components within normal limits  CBC - Abnormal; Notable for the following components:   RBC 5.59 (*)    All other components within normal limits  TSH - Abnormal; Notable for the following components:   TSH 0.000 (*)    All other components within normal limits  MAGNESIUM  T4    EKG  EKG Interpretation  Date/Time:  "Sunday July 29 2017 12:18:51 EST Ventricular Rate:  106 PR Interval:    QRS Duration: 80 QT Interval:  330 QTC  Calculation: 438 R Axis:   15 Text Interpretation:  Atrial flutter with variable A-V block Low voltage QRS Abnormal ECG atrial flutter is new since last tracing Confirmed by Lowanda Cashaw ([id number]) on 07/29/2017 12:25:01 PM       Radiology Dg Chest 2 View  Result Date: 07/29/2017 CLINICAL DATA:  Tachycardia and abnormal EKG.  Palpitations. EXAM: CHEST  2 VIEW COMPARISON:  12/03/2015. FINDINGS: Normal sized heart. Stable diffuse peribronchial thickening and accentuation of the interstitial markings. Normal vascularity. Thoracic spine degenerative changes. Cholecystectomy clips. IMPRESSION: No acute abnormality.  Stable chronic bronchitic changes. Electronically Signed   By: Steven  Reid M.D.   On: 07/29/2017 13:14    Procedures Procedures (including critical care time)  Medications Ordered in ED Medications  metoprolol tartrate (LOPRESSOR) injection 5 mg (not administered)  apixaban (ELIQUIS) tablet 5 mg (5 mg Oral Given 07/29/17 1413)  diltiazem (CARDIZEM CD) 24 hr capsule 240 mg (not administered)     Initial Impression / Assessment and Plan / ED Course  I have reviewed the triage vital signs and the nursing notes.  Pertinent labs & imaging results that were available during my care of the patient were reviewed by me and considered in my medical decision making (see chart for details).  Clinical Course as of Jul 30 1555  Sun Jul 29, 2017  1528 Pt's heart rate is less than 100.  She is rate controlled.  Will give a dose of cardizem as well as eliquis.   [JK]  1529 Tsh is very low.  Will check t4 to assess if she is hyper or hypothyroid  [JK]    Clinical Course User Index [JK] Wilkin Lippy, MD    Patient presented to the emergency room with atrial flutter.  Patient remains comfortable in the emergency room.  Her heart rate has.  I discussed the case with Dr. Bensimohn, cardiology.  He recommends starting the patient on Cardizem CD 240 mg.  He also recommends starting on Eliquis.  Patient is  not a candidate for ED cardioversion as the onset of her symptoms is not entirely clear.  Patient was given dose of those in the emergency room.   Patient feels comfortable with going home.  She will follow-up in the cardiology A. fib clinic.  Patient's TSH is extremely low.  T4 is pending.  Dr Zavitz will follow up on that result but I anticipate she will be able to go home.  Final Clinical Impressions(s) / ED Diagnoses   Final diagnoses:  Atrial flutter with rapid ventricular response (HCC)    ED Discharge Orders        Ordered    Amb referral to AFIB Clinic     02" /03/19 1244  Dorie Rank, MD 07/29/17 [phone]

## 2017-07-29 NOTE — ED Notes (Signed)
Main lab running T4.

## 2017-07-29 NOTE — Progress Notes (Signed)
ANTICOAGULATION CONSULT NOTE - Initial Consult  Pharmacy Consult for Eliquis Indication: Post Cardioversion  No Known Allergies  Patient Measurements: TBW 77 kg  Assessment: 62 yo F presents with CP and SOB. Found to be in Afib. Cardioverted in the ED. Meets criteria for normal dose with age and weight.  Goal of Therapy:  Monitor platelets by anticoagulation protocol: Yes   Plan:  Start Eliquis 5mg  PO BID x 4 weeks Monitor  CBC, s/s of bleed  Elenor Quinones, PharmD, BCPS Clinical Pharmacist Pager [phone] 07/29/2017 1:08 PM

## 2017-07-29 NOTE — ED Triage Notes (Addendum)
To treatment room for HR `30s and chest discomfort

## 2017-07-29 NOTE — ED Provider Notes (Signed)
Patient presents with new onset atrial flutter. Patient's care signed out to follow up T4 result. TSH was 0. Significant delay with blood processing. Patient has minimal symptoms at this time. Patient stable to follow-up outpatient with endocrinology and cardiology.  Golda Acre, MD 07/29/17 [phone]

## 2017-07-29 NOTE — Discharge Instructions (Addendum)
Follow up thyroid testing and recheck with medical doctor and endocrinologist.  See heart doctor as directed.  Information on my medicine - ELIQUIS (apixaban)  WHY WAS ELIQUIS PRESCRIBED FOR YOU? Eliquis was prescribed for you to reduce the risk of a blood clot forming that can cause a stroke after your cardioversion.  WHAT DO YOU NEED TO KNOW ABOUT ELIQUIS ? Take your Eliquis TWICE DAILY - one tablet in the morning and one tablet in the evening with or without food. If you have difficulty swallowing the tablet whole please discuss with your pharmacist how to take the medication safely.  Take Eliquis exactly as prescribed by your doctor and DO NOT stop taking Eliquis without talking to the doctor who prescribed the medication.  Stopping may increase your risk of developing a stroke.  Refill your prescription before you run out.  After discharge, you should have regular check-up appointments with your healthcare provider that is prescribing your Eliquis.  In the future your dose may need to be changed if your kidney function or weight changes by a significant amount or as you get older.  WHAT DO YOU DO IF YOU MISS A DOSE? If you miss a dose, take it as soon as you remember on the same day and resume taking twice daily.  Do not take more than one dose of ELIQUIS at the same time to make up a missed dose.  IMPORTANT SAFETY INFORMATION A possible side effect of Eliquis is bleeding. You should call your healthcare provider right away if you experience any of the following: Bleeding from an injury or your nose that does not stop. Unusual colored urine (red or dark brown) or unusual colored stools (red or black). Unusual bruising for unknown reasons. A serious fall or if you hit your head (even if there is no bleeding).  Some medicines may interact with Eliquis and might increase your risk of bleeding or clotting while on Eliquis. To help avoid this, consult your healthcare provider or  pharmacist prior to using any new prescription or non-prescription medications, including herbals, vitamins, non-steroidal anti-inflammatory drugs (NSAIDs) and supplements.  This website has more information on Eliquis (apixaban): http://www.eliquis.com/eliquis/home

## 2017-07-30 LAB — T4: T4 TOTAL: 11.6 ug/dL (ref 4.5–12.0)

## 2017-08-01 ENCOUNTER — Encounter (HOSPITAL_COMMUNITY): Payer: Self-pay | Admitting: Nurse Practitioner

## 2017-08-01 ENCOUNTER — Ambulatory Visit (HOSPITAL_COMMUNITY)
Admission: RE | Admit: 2017-08-01 | Discharge: 2017-08-01 | Disposition: A | Discharge status: Home / Self Care | Payer: BLUE CROSS/BLUE SHIELD | Source: Ambulatory Visit | Attending: Nurse Practitioner | Admitting: Nurse Practitioner

## 2017-08-01 VITALS — BP 118/64 | HR 64 | Ht 64.0 in | Wt 180.0 lb

## 2017-08-01 DIAGNOSIS — I480 Paroxysmal atrial fibrillation: Secondary | ICD-10-CM | POA: Diagnosis not present

## 2017-08-01 DIAGNOSIS — I10 Essential (primary) hypertension: Secondary | ICD-10-CM | POA: Diagnosis not present

## 2017-08-01 DIAGNOSIS — E119 Type 2 diabetes mellitus without complications: Secondary | ICD-10-CM | POA: Diagnosis not present

## 2017-08-01 DIAGNOSIS — Z79899 Other long term (current) drug therapy: Secondary | ICD-10-CM | POA: Diagnosis not present

## 2017-08-01 DIAGNOSIS — E669 Obesity, unspecified: Secondary | ICD-10-CM | POA: Insufficient documentation

## 2017-08-01 DIAGNOSIS — I4892 Unspecified atrial flutter: Secondary | ICD-10-CM | POA: Diagnosis not present

## 2017-08-01 DIAGNOSIS — I517 Cardiomegaly: Secondary | ICD-10-CM | POA: Insufficient documentation

## 2017-08-01 NOTE — Progress Notes (Signed)
Primary Care Physician: Leighton Ruff, MD Referring Physician: [facility] ER f/u   Jean Hopkins is a 61 y.o. female with a h/o obesity but with 80 lb weight loss thru weight watchers. When overweight she had issues with HTN and borderline DM, that have resolved with the weight loss.She presented to the ER with atrial flutter. She has noted some palps over the last several weeks. Rate controlled and not that symptomatic and was d/c home on Cardizem and eliquis.  She in the afib clinic for f/u. She is in SR today. She exercises regularly. Minimal caffeine and alcohol, no compelling symptoms for sleep study.  Today, she denies symptoms of palpitations, chest pain, shortness of breath, orthopnea, PND, lower extremity edema, dizziness, presyncope, syncope, or neurologic sequela. The patient is tolerating medications without difficulties and is otherwise without complaint today.   No past medical history on file. No past surgical history on file.  Current Outpatient Medications  Medication Sig Dispense Refill  . apixaban (ELIQUIS) 5 MG TABS tablet Take 1 tablet (5 mg total) by mouth 2 (two) times daily. 60 tablet 1  . Cholecalciferol (VITAMIN D PO) Take 1 tablet by mouth daily.    Marland Kitchen diltiazem (CARTIA XT) 240 MG 24 hr capsule Take 1 capsule (240 mg total) by mouth daily. 30 capsule 0   No current facility-administered medications for this encounter.     No Known Allergies  Social History   Socioeconomic History  . Marital status: Married    Spouse name: Not on file  . Number of children: Not on file  . Years of education: Not on file  . Highest education level: Not on file  Social Needs  . Financial resource strain: Not on file  . Food insecurity - worry: Not on file  . Food insecurity - inability: Not on file  . Transportation needs - medical: Not on file  . Transportation needs - non-medical: Not on file  Occupational History  . Not on file  Tobacco Use  . Smoking status: Never  Smoker  . Smokeless tobacco: Never Used  Substance and Sexual Activity  . Alcohol use: Not on file  . Drug use: Not on file  . Sexual activity: Not on file  Other Topics Concern  . Not on file  Social History Narrative  . Not on file    No family history on file.  ROS- All systems are reviewed and negative except as per the HPI above  Physical Exam: Vitals:   08/01/17 0834  BP: 118/64  Pulse: 64  Weight: 180 lb (81.6 kg)  Height: 5\' 4"  (1.626 m)   Wt Readings from Last 3 Encounters:  08/01/17 180 lb (81.6 kg)  07/29/17 170 lb (77.1 kg)    Labs: Lab Results  Component Value Date   NA 140 07/29/2017   K 3.8 07/29/2017   CL 106 07/29/2017   CO2 24 07/29/2017   GLUCOSE 123 (H) 07/29/2017   BUN 18 07/29/2017   CREATININE 0.58 07/29/2017   CALCIUM 9.1 07/29/2017   MG 1.8 07/29/2017   No results found for: INR No results found for: CHOL, HDL, LDLCALC, TRIG   GEN- The patient is well appearing, alert and oriented x 3 today.   Head- normocephalic, atraumatic Eyes-  Sclera clear, conjunctiva pink Ears- hearing intact Oropharynx- clear Neck- supple, no JVP Lymph- no cervical lymphadenopathy Lungs- Clear to ausculation bilaterally, normal work of breathing Heart- Regular rate and rhythm, no murmurs, rubs or gallops, PMI not  laterally displaced GI- soft, NT, ND, + BS Extremities- no clubbing, cyanosis, or edema MS- no significant deformity or atrophy Skin- no rash or lesion Psych- euthymic mood, full affect Neuro- strength and sensation are intact  EKG- NSR at 64 bpm, PR int 170 ms, qrs int 90 ms, qtc 408 ms EKG from ER reviewed with Dr. Rayann Heman, question if it is typical flutter, but he feels it is a coarse afib.    Assessment and Plan: 1. New onset fib?/flutter General info re afib Triggers discussed Lifestyles issues discussed , currently no outstanding issues Continue 240 mg Cardizem daily Will place 30 day event to determine burden, clarify typical  flutter vrs fibrillation  Echo  2.Chadsvasc score of 1-2( female, previous HTN) For now continue Eliquis Bleeding precautions discussed   Pt will f/u with general cardiology in month, family requests Dr. Geroge Baseman C. Skipper [place], [facility] [address] [place], [place] [address] [phone]

## 2017-08-02 ENCOUNTER — Telehealth (HOSPITAL_COMMUNITY): Payer: Self-pay | Admitting: *Deleted

## 2017-08-02 NOTE — Telephone Encounter (Signed)
Pt called in stating she flipped back into afib this morning after her morning walk. HR is in the 70s and BP 150/80. She feels ok just notices the irregular heart beat. Pt took her medication about a hour ago -- per Roderic Palau NP will continue current regimen hopefully the medication will convert her if not she will call in the morning to be seen. Pt verbalized understanding.

## 2017-08-03 ENCOUNTER — Ambulatory Visit: Payer: BLUE CROSS/BLUE SHIELD | Admitting: Cardiovascular Disease

## 2017-08-03 NOTE — Telephone Encounter (Signed)
Patient called back in this morning stating she finally converted around 4am this morning. She was very fatigued yesterday but her HRs were controlled in afib. Will watch monitor if high afib burden will be in touch with patient.

## 2017-08-07 ENCOUNTER — Telehealth: Payer: Self-pay

## 2017-08-07 ENCOUNTER — Other Ambulatory Visit: Payer: BLUE CROSS/BLUE SHIELD

## 2017-08-07 DIAGNOSIS — I480 Paroxysmal atrial fibrillation: Secondary | ICD-10-CM

## 2017-08-07 NOTE — Telephone Encounter (Signed)
-----   Message from Sanda Klein, MD sent at 08/07/2017 12:50 PM EST ----- Please order TSH and T4 for tomorrow, please. Thank you MCr

## 2017-08-07 NOTE — Telephone Encounter (Signed)
Labs ordered. Lab appointment scheduled for tomorrow at [facility] patient with recommendations. Patient verbalized understanding and agreed with plan. Appt scheduled with MCr for 09/18/17 at 11:20a.

## 2017-08-08 ENCOUNTER — Ambulatory Visit (INDEPENDENT_AMBULATORY_CARE_PROVIDER_SITE_OTHER): Payer: BLUE CROSS/BLUE SHIELD

## 2017-08-08 ENCOUNTER — Other Ambulatory Visit: Payer: Self-pay | Admitting: Cardiovascular Disease

## 2017-08-08 ENCOUNTER — Ambulatory Visit (HOSPITAL_COMMUNITY)
Admission: RE | Admit: 2017-08-08 | Discharge: 2017-08-08 | Disposition: A | Discharge status: Home / Self Care | Payer: BLUE CROSS/BLUE SHIELD | Source: Ambulatory Visit | Attending: Nurse Practitioner | Admitting: Nurse Practitioner

## 2017-08-08 ENCOUNTER — Other Ambulatory Visit: Payer: BLUE CROSS/BLUE SHIELD | Admitting: *Deleted

## 2017-08-08 DIAGNOSIS — I081 Rheumatic disorders of both mitral and tricuspid valves: Secondary | ICD-10-CM | POA: Diagnosis not present

## 2017-08-08 DIAGNOSIS — I480 Paroxysmal atrial fibrillation: Secondary | ICD-10-CM | POA: Insufficient documentation

## 2017-08-08 DIAGNOSIS — I371 Nonrheumatic pulmonary valve insufficiency: Secondary | ICD-10-CM | POA: Insufficient documentation

## 2017-08-08 NOTE — Progress Notes (Addendum)
  Echocardiogram 2D Echocardiogram has been performed.  Patient with heart monitor on chest.   Jean Hopkins L Androw 08/08/2017, 10:44 AM

## 2017-08-09 LAB — T4, FREE: Free T4: 2.68 ng/dL — ABNORMAL HIGH (ref 0.82–1.77)

## 2017-08-09 LAB — TSH: TSH: <0.006 u[IU]/mL — ABNORMAL LOW (ref 0.450–4.500)

## 2017-08-10 ENCOUNTER — Telehealth: Payer: Self-pay

## 2017-08-10 ENCOUNTER — Other Ambulatory Visit: Payer: Self-pay | Admitting: Cardiovascular Disease

## 2017-08-10 DIAGNOSIS — E0590 Thyrotoxicosis, unspecified without thyrotoxic crisis or storm: Secondary | ICD-10-CM

## 2017-08-10 DIAGNOSIS — I480 Paroxysmal atrial fibrillation: Secondary | ICD-10-CM

## 2017-08-10 NOTE — Telephone Encounter (Signed)
Patient's daughter called and is requesting her mothers referral be expedited due to her mothers condition- thyroid causing patient to go into a fib- please advise if you have seen this referral

## 2017-08-13 ENCOUNTER — Telehealth (HOSPITAL_COMMUNITY): Payer: Self-pay | Admitting: *Deleted

## 2017-08-13 MED ORDER — PROPRANOLOL HCL 40 MG PO TABS: 40.0000 mg | ORAL_TABLET | Freq: Two times a day (BID) | ORAL | 3 refills | Status: DC

## 2017-08-13 NOTE — Telephone Encounter (Signed)
315 tomorrow

## 2017-08-13 NOTE — Telephone Encounter (Signed)
Patient called back -- she was in afib most of the day on Saturday into Sunday before converting. She is waiting to hear from endocrinology on appointment she may need to see PCP in the interim to start treatment. Patient instructed to stop cardizem and start propranolol 40mg  BID. Will continue to watch monitor strips as they come in. Pt in agreement with plan.

## 2017-08-13 NOTE — Telephone Encounter (Signed)
Please advise on below  

## 2017-08-13 NOTE — Telephone Encounter (Signed)
I called LVM for patient to call back if she could come in 3:15 tomorrow for appointment.

## 2017-08-13 NOTE — Telephone Encounter (Signed)
We have scheduled patient on the first available with Dr Loanne Drilling on march 13.   Pt would like to know if there is any way we can get her in sometime sooner... Please advise

## 2017-08-13 NOTE — Telephone Encounter (Addendum)
Christine from [facility] office called stating received monitor notification from 2/16 - afib rate controlled. Discussed with Roderic Palau NP if pt is not seeing endocrinology soon will change cardizem to propranolol and see if this will cover her better until her thyroid is better controlled. I have left pt a message to find out status of endocrinologist appointment.

## 2017-08-14 ENCOUNTER — Telehealth: Payer: Self-pay | Admitting: Endocrinology

## 2017-08-14 NOTE — Telephone Encounter (Signed)
I called and made patient appt for tomorrow at 3:30 for new patient appt. 30 minute skot.

## 2017-08-14 NOTE — Telephone Encounter (Signed)
Patient is call stated you wanted her to come in today at 3:15, I couldn't schedule her because, it is a 15 min slot, stephanie will have to do it.

## 2017-08-15 ENCOUNTER — Encounter: Payer: Self-pay | Admitting: Endocrinology

## 2017-08-15 ENCOUNTER — Ambulatory Visit (INDEPENDENT_AMBULATORY_CARE_PROVIDER_SITE_OTHER): Payer: BLUE CROSS/BLUE SHIELD | Admitting: Endocrinology

## 2017-08-15 DIAGNOSIS — E0590 Thyrotoxicosis, unspecified without thyrotoxic crisis or storm: Secondary | ICD-10-CM | POA: Diagnosis not present

## 2017-08-15 DIAGNOSIS — I4891 Unspecified atrial fibrillation: Secondary | ICD-10-CM

## 2017-08-15 MED ORDER — METHIMAZOLE 10 MG PO TABS: 40.0000 mg | ORAL_TABLET | Freq: Two times a day (BID) | ORAL | 1 refills | Status: DC

## 2017-08-15 NOTE — Patient Instructions (Addendum)
I have sent a prescription to your pharmacy, to slow the thyroid. If ever you have fever while taking methimazole, stop it and call us, even if the reason is obvious, because of the risk of a rare side-effect. Please come back for a follow-up appointment in 2-3 weeks. We can consider other treatment options in the future if you want.       Hyperthyroidism Hyperthyroidism is when the thyroid is too active (overactive). Your thyroid is a large gland that is located in your neck. The thyroid helps to control how your body uses food (metabolism). When your thyroid is overactive, it produces too much of a hormone called thyroxine. What are the causes? Causes of hyperthyroidism may include:  Graves disease. This is when your immune system attacks the thyroid gland. This is the most common cause.  Inflammation of the thyroid gland.  Tumor in the thyroid gland or somewhere else.  Excessive use of thyroid medicines, including: ? Prescription thyroid supplement. ? Herbal supplements that mimic thyroid hormones.  Solid or fluid-filled lumps within your thyroid gland (thyroid nodules).  Excessive ingestion of iodine.  What increases the risk?  Being female.  Having a family history of thyroid conditions. What are the signs or symptoms? Signs and symptoms of hyperthyroidism may include:  Nervousness.  Inability to tolerate heat.  Unexplained weight loss.  Diarrhea.  Change in the texture of hair or skin.  Heart skipping beats or making extra beats.  Rapid heart rate.  Loss of menstruation.  Shaky hands.  Fatigue.  Restlessness.  Increased appetite.  Sleep problems.  Enlarged thyroid gland or nodules.  How is this diagnosed? Diagnosis of hyperthyroidism may include:  Medical history and physical exam.  Blood tests.  Ultrasound tests.  How is this treated? Treatment may include:  Medicines to control your thyroid.  Surgery to remove your  thyroid.  Radiation therapy.  Follow these instructions at home:  Take medicines only as directed by your health care provider.  Do not use any tobacco products, including cigarettes, chewing tobacco, or electronic cigarettes. If you need help quitting, ask your health care provider.  Do not exercise or do physical activity until your health care provider approves.  Keep all follow-up appointments as directed by your health care provider. This is important. Contact a health care provider if:  Your symptoms do not get better with treatment.  You have fever.  You are taking thyroid replacement medicine and you: ? Have depression. ? Feel mentally and physically slow. ? Have weight gain. Get help right away if:  You have decreased alertness or a change in your awareness.  You have abdominal pain.  You feel dizzy.  You have a rapid heartbeat.  You have an irregular heartbeat. This information is not intended to replace advice given to you by your health care provider. Make sure you discuss any questions you have with your health care provider. Document Released: 06/12/2005 Document Revised: 11/11/2015 Document Reviewed: 10/28/2013 Elsevier Interactive Patient Education  2018 [employer].

## 2017-08-15 NOTE — Progress Notes (Signed)
Subjective:    Patient ID: Jean Hopkins, female    DOB: 1956-06-06, 62 y.o.   MRN: [id number]  HPI Pt is referred by Dr Drema Dallas, for hyperthyroidism.  Pt reports she was dx'ed with hyperthyroidism a few weeks ago, in eval of AF.  she has never been on therapy for this.  she has never had XRT to the anterior neck, or thyroid surgery.  she has never had thyroid imaging.  she does not consume kelp or any other prescribed or non-prescribed thyroid medication.  she has never been on amiodarone.  She reports moderate palpitations in the chest, and assoc sob.  Past Medical History:  Diagnosis Date  . AF (atrial fibrillation) ([place])   . Hyperthyroidism     History reviewed. No pertinent surgical history.  Social History   Socioeconomic History  . Marital status: Married    Spouse name: Not on file  . Number of children: Not on file  . Years of education: Not on file  . Highest education level: Not on file  Social Needs  . Financial resource strain: Not on file  . Food insecurity - worry: Not on file  . Food insecurity - inability: Not on file  . Transportation needs - medical: Not on file  . Transportation needs - non-medical: Not on file  Occupational History  . Not on file  Tobacco Use  . Smoking status: Never Smoker  . Smokeless tobacco: Never Used  Substance and Sexual Activity  . Alcohol use: Not on file  . Drug use: Not on file  . Sexual activity: Not on file  Other Topics Concern  . Not on file  Social History Narrative  . Not on file    Current Outpatient Medications on File Prior to Visit  Medication Sig Dispense Refill  . apixaban (ELIQUIS) 5 MG TABS tablet Take 1 tablet (5 mg total) by mouth 2 (two) times daily. 60 tablet 1  . Cholecalciferol (VITAMIN D PO) Take 1 tablet by mouth daily.    . propranolol (INDERAL) 40 MG tablet Take 1 tablet (40 mg total) by mouth 2 (two) times daily. 60 tablet 3   No current facility-administered medications on file prior to visit.       No Known Allergies  Family History  Problem Relation Age of Onset  . Thyroid disease Neg Hx     BP 112/62 (BP Location: Right Arm, Patient Position: Sitting, Cuff Size: Normal)   Pulse 78   Wt 181 lb (82.1 kg)   SpO2 97%   BMI 31.07 kg/m   Review of Systems denies headache, hoarseness, visual loss, edema, diarrhea, polyuria, muscle weakness, tremor, anxiety, heat intolerance, easy bruising, and rhinorrhea. She has insomnia and excessive diaphoresis.  She has lost 80 lbs x 2 years, due to her efforts.       Objective:   Physical Exam VS: see vs page GEN: no distress HEAD: head: no deformity eyes: no periorbital swelling, no proptosis external nose and ears are normal mouth: no lesion seen NECK: supple, thyroid is not enlarged CHEST WALL: no deformity LUNGS: clear to auscultation CV: reg rate but irreg rhythm, no murmur ABD: abdomen is soft, nontender.  no hepatosplenomegaly.  not distended.  no hernia MUSCULOSKELETAL: muscle bulk and strength are grossly normal.  no obvious joint swelling.  gait is normal and steady EXTEMITIES: no deformity.  no edema PULSES: no carotid bruit NEURO:  cn 2-12 grossly intact.   readily moves all 4's.  sensation is  intact to touch on all 4's.  No tremor SKIN:  Normal texture and temperature.  No rash or suspicious lesion is visible.  Not diaphoretic NODES:  None palpable at the neck PSYCH: alert, well-oriented.  Does not appear anxious nor depressed.    Lab Results  Component Value Date   TSH <0.006 (L) 08/08/2017   T4TOTAL 11.6 07/29/2017   I have reviewed outside records, and summarized: Pt was noted to have suppressed TSH, and referred here.  She was rx'ed epiquis and lopressor, for AF.  HR was well-controlled on rx     Assessment & Plan:  Hyperthyroidism, new to me, prob due to Grave's Dz.   AF: in this setting, she needs prompt improvement in thyroid function, so RAI is not an option now.     Patient Instructions  I have  sent a prescription to your pharmacy, to slow the thyroid. If ever you have fever while taking methimazole, stop it and call us, even if the reason is obvious, because of the risk of a rare side-effect. Please come back for a follow-up appointment in 2-3 weeks. We can consider other treatment options in the future if you want.       Hyperthyroidism Hyperthyroidism is when the thyroid is too active (overactive). Your thyroid is a large gland that is located in your neck. The thyroid helps to control how your body uses food (metabolism). When your thyroid is overactive, it produces too much of a hormone called thyroxine. What are the causes? Causes of hyperthyroidism may include:  Graves disease. This is when your immune system attacks the thyroid gland. This is the most common cause.  Inflammation of the thyroid gland.  Tumor in the thyroid gland or somewhere else.  Excessive use of thyroid medicines, including: ? Prescription thyroid supplement. ? Herbal supplements that mimic thyroid hormones.  Solid or fluid-filled lumps within your thyroid gland (thyroid nodules).  Excessive ingestion of iodine.  What increases the risk?  Being female.  Having a family history of thyroid conditions. What are the signs or symptoms? Signs and symptoms of hyperthyroidism may include:  Nervousness.  Inability to tolerate heat.  Unexplained weight loss.  Diarrhea.  Change in the texture of hair or skin.  Heart skipping beats or making extra beats.  Rapid heart rate.  Loss of menstruation.  Shaky hands.  Fatigue.  Restlessness.  Increased appetite.  Sleep problems.  Enlarged thyroid gland or nodules.  How is this diagnosed? Diagnosis of hyperthyroidism may include:  Medical history and physical exam.  Blood tests.  Ultrasound tests.  How is this treated? Treatment may include:  Medicines to control your thyroid.  Surgery to remove your thyroid.  Radiation  therapy.  Follow these instructions at home:  Take medicines only as directed by your health care provider.  Do not use any tobacco products, including cigarettes, chewing tobacco, or electronic cigarettes. If you need help quitting, ask your health care provider.  Do not exercise or do physical activity until your health care provider approves.  Keep all follow-up appointments as directed by your health care provider. This is important. Contact a health care provider if:  Your symptoms do not get better with treatment.  You have fever.  You are taking thyroid replacement medicine and you: ? Have depression. ? Feel mentally and physically slow. ? Have weight gain. Get help right away if:  You have decreased alertness or a change in your awareness.  You have abdominal pain.  You feel  dizzy.  You have a rapid heartbeat.  You have an irregular heartbeat. This information is not intended to replace advice given to you by your health care provider. Make sure you discuss any questions you have with your health care provider. Document Released: 06/12/2005 Document Revised: 11/11/2015 Document Reviewed: 10/28/2013 Elsevier Interactive Patient Education  2018 [employer].

## 2017-08-16 ENCOUNTER — Ambulatory Visit: Payer: BLUE CROSS/BLUE SHIELD | Admitting: Endocrinology

## 2017-08-18 ENCOUNTER — Encounter: Payer: Self-pay | Admitting: Endocrinology

## 2017-08-18 DIAGNOSIS — I4891 Unspecified atrial fibrillation: Secondary | ICD-10-CM | POA: Insufficient documentation

## 2017-08-18 DIAGNOSIS — E0590 Thyrotoxicosis, unspecified without thyrotoxic crisis or storm: Secondary | ICD-10-CM | POA: Insufficient documentation

## 2017-09-04 ENCOUNTER — Ambulatory Visit (INDEPENDENT_AMBULATORY_CARE_PROVIDER_SITE_OTHER): Payer: BLUE CROSS/BLUE SHIELD | Admitting: Endocrinology

## 2017-09-04 ENCOUNTER — Encounter: Payer: Self-pay | Admitting: Endocrinology

## 2017-09-04 VITALS — BP 110/64 | HR 62 | Ht 64.0 in | Wt 183.0 lb

## 2017-09-04 DIAGNOSIS — I4891 Unspecified atrial fibrillation: Secondary | ICD-10-CM

## 2017-09-04 LAB — T4, FREE: FREE T4: 2.49 ng/dL — AB (ref 0.60–1.60)

## 2017-09-04 LAB — TSH

## 2017-09-04 NOTE — Progress Notes (Signed)
   Subjective:    Patient ID: Jean Hopkins, female    DOB: 1955/11/24, 62 y.o.   MRN: [id number]  HPI Pt returns for f/u of hyperthyroidism (dx'ed early 2019, in eval of AF; she has never had thyroid imaging, but Grave's Dz is presumed, due to severity; tapazole was chosen as initial rx, due to AF).  Pt says she has noticed only 1 episode of palptations in the past few weeks.  Past Medical History:  Diagnosis Date  . AF (atrial fibrillation) ([place])   . Hyperthyroidism     No past surgical history on file.  Social History   Socioeconomic History  . Marital status: Married    Spouse name: Not on file  . Number of children: Not on file  . Years of education: Not on file  . Highest education level: Not on file  Social Needs  . Financial resource strain: Not on file  . Food insecurity - worry: Not on file  . Food insecurity - inability: Not on file  . Transportation needs - medical: Not on file  . Transportation needs - non-medical: Not on file  Occupational History  . Not on file  Tobacco Use  . Smoking status: Never Smoker  . Smokeless tobacco: Never Used  Substance and Sexual Activity  . Alcohol use: Not on file  . Drug use: Not on file  . Sexual activity: Not on file  Other Topics Concern  . Not on file  Social History Narrative  . Not on file    Current Outpatient Medications on File Prior to Visit  Medication Sig Dispense Refill  . apixaban (ELIQUIS) 5 MG TABS tablet Take 1 tablet (5 mg total) by mouth 2 (two) times daily. 60 tablet 1  . Cholecalciferol (VITAMIN D PO) Take 1 tablet by mouth daily.    . methimazole (TAPAZOLE) 10 MG tablet Take 4 tablets (40 mg total) by mouth 2 (two) times daily. 240 tablet 1  . propranolol (INDERAL) 40 MG tablet Take 1 tablet (40 mg total) by mouth 2 (two) times daily. 60 tablet 3   No current facility-administered medications on file prior to visit.     No Known Allergies  Family History  Problem Relation Age of Onset  .  Thyroid disease Neg Hx     BP 110/64   Pulse 62   Ht 5\' 4"  (1.626 m)   Wt 183 lb (83 kg)   SpO2 97%   BMI 31.41 kg/m   Review of Systems Denies fever    Objective:   Physical Exam VITAL SIGNS:  See vs page GENERAL: no distress NECK: Thyroid is twice normal size (R>L), but no palpable nodule.  No palpable lymphadenopathy at the anterior neck.       Assessment & Plan:  Hyperthyroidism, due for recheck  Patient Instructions  blood tests are requested for you today.  We'll let you know about the results. If ever you have fever while taking methimazole, stop it and call us, even if the reason is obvious, because of the risk of a rare side-effect.  Please come back for a follow-up appointment in 6 weeks.

## 2017-09-04 NOTE — Patient Instructions (Signed)
blood tests are requested for you today.  We'll let you know about the results. If ever you have fever while taking methimazole, stop it and call us, even if the reason is obvious, because of the risk of a rare side-effect. Please come back for a follow-up appointment in 6 weeks.  

## 2017-09-05 ENCOUNTER — Ambulatory Visit: Payer: BLUE CROSS/BLUE SHIELD | Admitting: Endocrinology

## 2017-09-18 ENCOUNTER — Encounter: Payer: Self-pay | Admitting: Cardiovascular Disease

## 2017-09-18 ENCOUNTER — Ambulatory Visit (INDEPENDENT_AMBULATORY_CARE_PROVIDER_SITE_OTHER): Payer: BLUE CROSS/BLUE SHIELD | Admitting: Cardiovascular Disease

## 2017-09-18 VITALS — BP 116/60 | HR 58 | Ht 64.0 in | Wt 185.2 lb

## 2017-09-18 DIAGNOSIS — E0590 Thyrotoxicosis, unspecified without thyrotoxic crisis or storm: Secondary | ICD-10-CM | POA: Diagnosis not present

## 2017-09-18 DIAGNOSIS — Z7901 Long term (current) use of anticoagulants: Secondary | ICD-10-CM

## 2017-09-18 DIAGNOSIS — I480 Paroxysmal atrial fibrillation: Secondary | ICD-10-CM

## 2017-09-18 NOTE — Progress Notes (Signed)
Cardiology Office Note:    Date:  09/18/2017   ID:  DARIAN ACE, DOB 08-Aug-1955, MRN [id number]  PCP:  Leighton Ruff, MD  Cardiologist:  New  Referring MD: Leighton Ruff, MD   Chief Complaint  Patient presents with  . Atrial Fibrillation   History of Present Illness:    AFFIE GASNER is a 62 y.o. female with a hx of the setting of thyrotoxicosis, presumably related to Graves' disease.  Recent event monitor showed 3 episodes of atrial fibrillation with rapid ventricular response and a period of 30 days.  Each episode lasted approximately 24 hours.  All of the episodes have been associated with extreme fatigue, but without frank dyspnea or chest discomfort.  On anticoagulation with Eliquis without bleeding problems.  The patient specifically denies any chest pain at rest or exertion, dyspnea at rest or with exertion, orthopnea, paroxysmal nocturnal dyspnea, syncope, focal neurological deficits, intermittent claudication, lower extremity edema, unexplained weight gain, cough, hemoptysis or wheezing.  She does have problems with hot flashes and diaphoresis but attributed these to perimenopausal state.  She has not lost weight and denies heat intolerance, tremor or other symptoms of thyrotoxicosis.  She has a small goiter.  Her daughter reports that her mother does snore, although this is improved from years past.  There are no symptoms of hypersomnolence or any witnessed apneic episodes.  Has lost about 80 pounds in the last couple of years.  Despite antithyroid medications, her most recent TSH was still completely suppressed with elevated T4.  Past Medical History:  Diagnosis Date  . AF (atrial fibrillation) ([place])   . Hyperthyroidism     History reviewed. No pertinent surgical history.  Current Medications: Current Meds  Medication Sig  . apixaban (ELIQUIS) 5 MG TABS tablet Take 1 tablet (5 mg total) by mouth 2 (two) times daily.  . Cholecalciferol (VITAMIN D PO) Take 1  tablet by mouth daily.  . methimazole (TAPAZOLE) 10 MG tablet Take 4 tablets (40 mg total) by mouth 2 (two) times daily.  . propranolol (INDERAL) 40 MG tablet Take 1 tablet (40 mg total) by mouth 2 (two) times daily.     Allergies:   Patient has no known allergies.   Social History   Socioeconomic History  . Marital status: Married    Spouse name: Not on file  . Number of children: Not on file  . Years of education: Not on file  . Highest education level: Not on file  Occupational History  . Not on file  Social Needs  . Financial resource strain: Not on file  . Food insecurity:    Worry: Not on file    Inability: Not on file  . Transportation needs:    Medical: Not on file    Non-medical: Not on file  Tobacco Use  . Smoking status: Former Research scientist (life sciences)  . Smokeless tobacco: Never Used  Substance and Sexual Activity  . Alcohol use: Not on file  . Drug use: Yes    Types: "Crack" cocaine  . Sexual activity: Not on file  Lifestyle  . Physical activity:    Days per week: Not on file    Minutes per session: Not on file  . Stress: Not on file  Relationships  . Social connections:    Talks on phone: Not on file    Gets together: Not on file    Attends religious service: Not on file    Active member of club or organization: Not on file  Attends meetings of clubs or organizations: Not on file    Relationship status: Not on file  Other Topics Concern  . Not on file  Social History Narrative  . Not on file     Family History: The patient's family history is negative for Thyroid disease.  Negative for atrial fibrillation  ROS:   Please see the history of present illness.     All other systems reviewed and are negative.  EKGs/Labs/Other Studies Reviewed:    The following studies were reviewed today: Echocardiogram  EKG:  EKG is not ordered today.  The ekg ordered 08/01/2017 shows normal sinus rhythm and questionable left atrial abnormality, otherwise normal  tracing  Recent Labs: 07/29/2017: BUN 18; Creatinine, Ser 0.58; Hemoglobin 14.8; Magnesium 1.8; Platelets 273; Potassium 3.8; Sodium 140 09/04/2017: TSH <0.01 Repeated and verified X2.  Recent Lipid Panel No results found for: CHOL, TRIG, HDL, CHOLHDL, VLDL, LDLCALC, LDLDIRECT  Physical Exam:    VS:  BP 116/60 (BP Location: Right Arm, Patient Position: Sitting, Cuff Size: Normal)   Pulse (!) 58   Ht 5\' 4"  (1.626 m)   Wt 185 lb 3.2 oz (84 kg)   SpO2 99%   BMI 31.79 kg/m     Wt Readings from Last 3 Encounters:  09/18/17 185 lb 3.2 oz (84 kg)  09/04/17 183 lb (83 kg)  08/15/17 181 lb (82.1 kg)     GEN: Mildly obese well nourished, well developed in no acute distress HEENT: Normal NECK: No JVD; No carotid bruits.  Very small goiter LYMPHATICS: No lymphadenopathy CARDIAC: RRR, early peaking 2/6 aortic ejection murmur, no diastolic murmurs, rubs, gallops RESPIRATORY:  Clear to auscultation without rales, wheezing or rhonchi  ABDOMEN: Soft, non-tender, non-distended MUSCULOSKELETAL:  No edema; No deformity  SKIN: Warm and dry NEUROLOGIC:  Alert and oriented x 3 PSYCHIATRIC:  Normal affect   ASSESSMENT:    1. Paroxysmal atrial fibrillation (HCC)   2. Hyperthyroidism   3. Long term (current) use of anticoagulants    PLAN:    In order of problems listed above:  1. AFib: Secondary to thyrotoxicosis.  Appropriately anticoagulated.  An additional dose of propranolol for symptomatic events is appropriate but I do not think she would tolerate regular to sinus bradycardia.  I also do not think that true antiarrhythmic therapy would be a risk/symptom relief point of view.  Fully expect atrial fibrillation will resolve when she becomes euthyroid.  At worst the left atrium was borderline dilated; right atrium appears normal in size on my review. 2. Hyperthyroidism: Reviewed the typical course of treatment.  Needs to achieve euthyroid state with medications before consideration of radioactive  iodine or surgical ablation.   Medication Adjustments/Labs and Tests Ordered: Current medicines are reviewed at length with the patient today.  Concerns regarding medicines are outlined above.  No orders of the defined types were placed in this encounter.  No orders of the defined types were placed in this encounter.   Signed, Sanda Klein, MD  09/18/2017 11:54 AM    [place]

## 2017-09-18 NOTE — Patient Instructions (Signed)
Dr Sallyanne Kuster recommends that you schedule a follow-up appointment in 6 months.  If you need a refill on your cardiac medications before your next appointment, please call your pharmacy.

## 2017-10-05 ENCOUNTER — Encounter: Payer: Self-pay | Admitting: Endocrinology

## 2017-10-05 ENCOUNTER — Ambulatory Visit (INDEPENDENT_AMBULATORY_CARE_PROVIDER_SITE_OTHER): Payer: BLUE CROSS/BLUE SHIELD | Admitting: Endocrinology

## 2017-10-05 VITALS — BP 122/72 | HR 57 | Wt 184.6 lb

## 2017-10-05 DIAGNOSIS — E0590 Thyrotoxicosis, unspecified without thyrotoxic crisis or storm: Secondary | ICD-10-CM | POA: Diagnosis not present

## 2017-10-05 LAB — TSH: TSH: 0.05 u[IU]/mL — ABNORMAL LOW (ref 0.35–4.50)

## 2017-10-05 LAB — T4, FREE: Free T4: 0.69 ng/dL (ref 0.60–1.60)

## 2017-10-05 MED ORDER — METHIMAZOLE 10 MG PO TABS: 20.0000 mg | ORAL_TABLET | Freq: Two times a day (BID) | ORAL | 1 refills | Status: DC

## 2017-10-05 NOTE — Patient Instructions (Signed)
blood tests are requested for you today.  We'll let you know about the results. If ever you have fever while taking methimazole, stop it and call us, even if the reason is obvious, because of the risk of a rare side-effect. Please come back for a follow-up appointment in 6 weeks.  

## 2017-10-05 NOTE — Progress Notes (Signed)
Subjective:    Patient ID: Jean Hopkins, female    DOB: [birth year], 62 y.o.   MRN: [id number]  HPI Pt returns for f/u of hyperthyroidism (dx'ed early 2019, in eval of AF; she has never had thyroid imaging, but Grave's Dz is presumed, due to severity; tapazole was chosen as initial rx, due to AF).  no further palpitations, but she has intermitt palpitations.  She never misses her meds.  Past Medical History:  Diagnosis Date  . AF (atrial fibrillation) ([place])   . Hyperthyroidism     History reviewed. No pertinent surgical history.  Social History   Socioeconomic History  . Marital status: Married    Spouse name: Not on file  . Number of children: Not on file  . Years of education: Not on file  . Highest education level: Not on file  Occupational History  . Not on file  Social Needs  . Financial resource strain: Not on file  . Food insecurity:    Worry: Not on file    Inability: Not on file  . Transportation needs:    Medical: Not on file    Non-medical: Not on file  Tobacco Use  . Smoking status: Former Research scientist (life sciences)  . Smokeless tobacco: Never Used  Substance and Sexual Activity  . Alcohol use: Not on file  . Drug use: Yes    Types: "Crack" cocaine  . Sexual activity: Not on file  Lifestyle  . Physical activity:    Days per week: Not on file    Minutes per session: Not on file  . Stress: Not on file  Relationships  . Social connections:    Talks on phone: Not on file    Gets together: Not on file    Attends religious service: Not on file    Active member of club or organization: Not on file    Attends meetings of clubs or organizations: Not on file    Relationship status: Not on file  . Intimate partner violence:    Fear of current or ex partner: Not on file    Emotionally abused: Not on file    Physically abused: Not on file    Forced sexual activity: Not on file  Other Topics Concern  . Not on file  Social History Narrative  . Not on file    Current  Outpatient Medications on File Prior to Visit  Medication Sig Dispense Refill  . apixaban (ELIQUIS) 5 MG TABS tablet Take 1 tablet (5 mg total) by mouth 2 (two) times daily. 60 tablet 1  . Cholecalciferol (VITAMIN D PO) Take 1 tablet by mouth daily.    . propranolol (INDERAL) 40 MG tablet Take 1 tablet (40 mg total) by mouth 2 (two) times daily. 60 tablet 3   No current facility-administered medications on file prior to visit.     No Known Allergies  Family History  Problem Relation Age of Onset  . Thyroid disease Neg Hx     BP 122/72 (BP Location: Left Arm, Patient Position: Sitting, Cuff Size: Normal)   Pulse (!) 57   Wt 184 lb 9.6 oz (83.7 kg)   SpO2 97%   BMI 31.69 kg/m    Review of Systems Denies fever.     Objective:   Physical Exam VITAL SIGNS:  See vs page.  GENERAL: no distress. NECK: Thyroid is twice normal size (R>L), but no palpable nodule.  No palpable lymphadenopathy at the anterior neck.  SKIN: not diaphoretic NEURO: no  tremor  Lab Results  Component Value Date   TSH 0.05 (L) 10/05/2017   T4TOTAL 11.6 07/29/2017      Assessment & Plan:  Hyperthyroidism: improved.  Reduce tapazole to 20-BID AF: we'll follow HR, as it is slightly low today.  Patient Instructions  blood tests are requested for you today.  We'll let you know about the results. If ever you have fever while taking methimazole, stop it and call us, even if the reason is obvious, because of the risk of a rare side-effect.  Please come back for a follow-up appointment in 6 weeks.

## 2017-10-06 ENCOUNTER — Other Ambulatory Visit: Payer: Self-pay

## 2017-10-06 ENCOUNTER — Emergency Department (HOSPITAL_BASED_OUTPATIENT_CLINIC_OR_DEPARTMENT_OTHER)
Admission: EM | Admit: 2017-10-06 | Discharge: 2017-10-06 | Disposition: A | Discharge status: Home / Self Care | Payer: BLUE CROSS/BLUE SHIELD | Attending: Emergency Medicine | Admitting: Emergency Medicine

## 2017-10-06 ENCOUNTER — Emergency Department (HOSPITAL_BASED_OUTPATIENT_CLINIC_OR_DEPARTMENT_OTHER): Payer: BLUE CROSS/BLUE SHIELD

## 2017-10-06 ENCOUNTER — Encounter (HOSPITAL_BASED_OUTPATIENT_CLINIC_OR_DEPARTMENT_OTHER): Payer: Self-pay | Admitting: Adult Health

## 2017-10-06 DIAGNOSIS — I4891 Unspecified atrial fibrillation: Secondary | ICD-10-CM | POA: Diagnosis not present

## 2017-10-06 DIAGNOSIS — Z87891 Personal history of nicotine dependence: Secondary | ICD-10-CM | POA: Insufficient documentation

## 2017-10-06 DIAGNOSIS — Z7901 Long term (current) use of anticoagulants: Secondary | ICD-10-CM | POA: Diagnosis not present

## 2017-10-06 DIAGNOSIS — R109 Unspecified abdominal pain: Secondary | ICD-10-CM

## 2017-10-06 DIAGNOSIS — Z79899 Other long term (current) drug therapy: Secondary | ICD-10-CM | POA: Diagnosis not present

## 2017-10-06 LAB — BASIC METABOLIC PANEL
Anion gap: 6 (ref 5–15)
BUN: 21 mg/dL — ABNORMAL HIGH (ref 6–20)
CHLORIDE: 110 mmol/L (ref 101–111)
CO2: 24 mmol/L (ref 22–32)
CREATININE: 0.81 mg/dL (ref 0.44–1.00)
Calcium: 8.9 mg/dL (ref 8.9–10.3)
GFR calc non Af Amer: >60 mL/min (ref >60)
Glucose, Bld: 127 mg/dL — ABNORMAL HIGH (ref 65–99)
POTASSIUM: 3.8 mmol/L (ref 3.5–5.1)
SODIUM: 140 mmol/L (ref 135–145)

## 2017-10-06 LAB — CBC WITH DIFFERENTIAL/PLATELET
Basophils Absolute: 0 10*3/uL (ref 0.0–0.1)
Basophils Relative: 0 %
EOS ABS: 0.3 10*3/uL (ref 0.0–0.7)
Eosinophils Relative: 4 %
HCT: 44.1 % (ref 36.0–46.0)
HEMOGLOBIN: 14.8 g/dL (ref 12.0–15.0)
LYMPHS ABS: 2.7 10*3/uL (ref 0.7–4.0)
LYMPHS PCT: 37 %
MCH: 26.2 pg (ref 26.0–34.0)
MCHC: 33.6 g/dL (ref 30.0–36.0)
MCV: 78.1 fL (ref 78.0–100.0)
MONOS PCT: 9 %
Monocytes Absolute: 0.7 10*3/uL (ref 0.1–1.0)
NEUTROS PCT: 50 %
Neutro Abs: 3.6 10*3/uL (ref 1.7–7.7)
Platelets: 280 10*3/uL (ref 150–400)
RBC: 5.65 MIL/uL — AB (ref 3.87–5.11)
RDW: 14.1 % (ref 11.5–15.5)
WBC: 7.2 10*3/uL (ref 4.0–10.5)

## 2017-10-06 LAB — URINALYSIS, ROUTINE W REFLEX MICROSCOPIC
BILIRUBIN URINE: NEGATIVE
Glucose, UA: NEGATIVE mg/dL
HGB URINE DIPSTICK: NEGATIVE
Ketones, ur: NEGATIVE mg/dL
Leukocytes, UA: NEGATIVE
NITRITE: NEGATIVE
PH: 6.5 (ref 5.0–8.0)
Protein, ur: NEGATIVE mg/dL
SPECIFIC GRAVITY, URINE: 1.01 (ref 1.005–1.030)

## 2017-10-06 MED ORDER — IBUPROFEN 600 MG PO TABS: 600.0000 mg | ORAL_TABLET | Freq: Four times a day (QID) | ORAL | 0 refills | Status: DC | PRN

## 2017-10-06 MED ORDER — SODIUM CHLORIDE 0.9 % IV BOLUS
1000.0000 mL | Freq: Once | INTRAVENOUS | Status: AC
  Administered 2017-10-06: 1000 mL via INTRAVENOUS

## 2017-10-06 MED ORDER — HYDROCODONE-ACETAMINOPHEN 5-325 MG PO TABS: 1.0000 | ORAL_TABLET | ORAL | 0 refills | Status: DC | PRN

## 2017-10-06 MED ORDER — MORPHINE SULFATE (PF) 4 MG/ML IV SOLN
4.0000 mg | Freq: Once | INTRAVENOUS | Status: AC
  Administered 2017-10-06: 4 mg via INTRAVENOUS
  Filled 2017-10-06: qty 1

## 2017-10-06 MED ORDER — ONDANSETRON HCL 4 MG/2ML IJ SOLN
4.0000 mg | Freq: Once | INTRAMUSCULAR | Status: AC
  Administered 2017-10-06: 4 mg via INTRAVENOUS
  Filled 2017-10-06: qty 2

## 2017-10-06 MED ORDER — KETOROLAC TROMETHAMINE 30 MG/ML IJ SOLN
30.0000 mg | Freq: Once | INTRAMUSCULAR | Status: AC
  Administered 2017-10-06: 30 mg via INTRAVENOUS
  Filled 2017-10-06: qty 1

## 2017-10-06 NOTE — ED Provider Notes (Signed)
[facility] EMERGENCY DEPARTMENT Provider Note   CSN: [id number] Arrival date & time: 10/06/17  1846     History   Chief Complaint Chief Complaint  Patient presents with  . Flank Pain    HPI Jean Hopkins is a 62 y.o. female.  Pt presents to the ED today with left flank pain.  Pt said sx started yesterday.  She said she initially thought it was a pulled muscle, but it is worse today.  She denies any trauma.  No urinary sx.  She has never had anything like this.  Pt is on Eliquis for a.fib.  CHA2DS2/VAS Stroke Risk Points  Current as of 2 minutes ago     1 >= 2 Points: High Risk  1 - 1.99 Points: Medium Risk  0 Points: Low Risk    The patient's score has not changed in the past year.:  No Change     Details    This score determines the patient's risk of having a stroke if the  patient has atrial fibrillation.       Points Metrics  0 Has Congestive Heart Failure:  No    Current as of 2 minutes ago  0 Has Vascular Disease:  No    Current as of 2 minutes ago  0 Has Hypertension:  No    Current as of 2 minutes ago  0 Age:  9    Current as of 2 minutes ago  0 Has Diabetes:  No    Current as of 2 minutes ago  0 Had Stroke:  No  Had TIA:  No  Had thromboembolism:  No    Current as of 2 minutes ago  1 Female:  Yes    Current as of 2 minutes ago              Past Medical History:  Diagnosis Date  . AF (atrial fibrillation) ([place])   . Hyperthyroidism     Patient Active Problem List   Diagnosis Date Noted  . Hyperthyroidism   . AF (atrial fibrillation) ([place])     History reviewed. No pertinent surgical history.   OB History   None      Home Medications    Prior to Admission medications   Medication Sig Start Date End Date Taking? Authorizing Provider  apixaban (ELIQUIS) 5 MG TABS tablet Take 1 tablet (5 mg total) by mouth 2 (two) times daily. 07/29/17   Dorie Rank, MD  Cholecalciferol (VITAMIN D PO) Take 1 tablet by mouth daily.    [provider]  HYDROcodone-acetaminophen (NORCO/VICODIN) 5-325 MG tablet Take 1 tablet by mouth every 4 (four) hours as needed. 10/06/17   Isla Pence, MD  ibuprofen (ADVIL,MOTRIN) 600 MG tablet Take 1 tablet (600 mg total) by mouth every 6 (six) hours as needed. 10/06/17   Isla Pence, MD  methimazole (TAPAZOLE) 10 MG tablet Take 2 tablets (20 mg total) by mouth 2 (two) times daily. 10/05/17   Renato Shin, MD  propranolol (INDERAL) 40 MG tablet Take 1 tablet (40 mg total) by mouth 2 (two) times daily. 08/13/17   Sherran Needs, NP    Family History Family History  Problem Relation Age of Onset  . Thyroid disease Neg Hx     Social History Social History   Tobacco Use  . Smoking status: Former Research scientist (life sciences)  . Smokeless tobacco: Never Used  Substance Use Topics  . Alcohol use: Not on file  . Drug use: Yes  Types: "Crack" cocaine     Allergies   Patient has no known allergies.   Review of Systems Review of Systems  Genitourinary: Positive for flank pain.  All other systems reviewed and are negative.    Physical Exam Updated Vital Signs BP (!) 145/61   Pulse 64   Temp 98.1 F (36.7 C) (Oral)   Resp 18   SpO2 98%   Physical Exam  Constitutional: She appears well-developed and well-nourished.  HENT:  Head: Normocephalic and atraumatic.  Right Ear: External ear normal.  Left Ear: External ear normal.  Nose: Nose normal.  Mouth/Throat: Oropharynx is clear and moist.  Eyes: Pupils are equal, round, and reactive to light. Conjunctivae and EOM are normal.  Neck: Normal range of motion. Neck supple.  Cardiovascular: Normal rate, regular rhythm, normal heart sounds and intact distal pulses.  Pulmonary/Chest: Effort normal and breath sounds normal.  Abdominal: Soft. Bowel sounds are normal.  Musculoskeletal:       Arms: Skin: Skin is warm. Capillary refill takes less than 2 seconds.  Psychiatric: She has a normal mood and affect. Her behavior is normal. Judgment  and thought content normal.  Nursing note and vitals reviewed.    ED Treatments / Results  Labs (all labs ordered are listed, but only abnormal results are displayed) Labs Reviewed  BASIC METABOLIC PANEL - Abnormal; Notable for the following components:      Result Value   Glucose, Bld 127 (*)    BUN 21 (*)    All other components within normal limits  CBC WITH DIFFERENTIAL/PLATELET - Abnormal; Notable for the following components:   RBC 5.65 (*)    All other components within normal limits  URINALYSIS, ROUTINE W REFLEX MICROSCOPIC    EKG None  Radiology Ct Renal Stone Study  Result Date: 10/06/2017 CLINICAL DATA:  Left flank pain EXAM: CT ABDOMEN AND PELVIS WITHOUT CONTRAST TECHNIQUE: Multidetector CT imaging of the abdomen and pelvis was performed following the standard protocol without oral or IV contrast. COMPARISON:  None. FINDINGS: Lower chest: There is mild bibasilar atelectatic change. No lung base edema or consolidation. Hepatobiliary: No focal liver lesions are apparent on this noncontrast enhanced study. Gallbladder is absent. No biliary duct dilatation. Pancreas: There is no pancreatic mass or inflammatory focus. Spleen: No splenic lesions are evident. Adrenals/Urinary Tract: Adrenals appear normal bilaterally. Kidneys bilaterally show no evident mass or hydronephrosis on either side. There is a small extrarenal pelvis on the right, an anatomic variant. There is no evident renal or ureteral calculus on either side. Urinary bladder wall is not appreciably thickened. Stomach/Bowel: There are scattered colonic diverticula, primarily but not exclusively in the sigmoid colon. No diverticulitis. There is no appreciable bowel wall or mesenteric thickening. There is no evident bowel obstruction. No free air or portal venous air evident. Vascular/Lymphatic: No abdominal aortic aneurysm. No vascular lesions are evident. There are subcentimeter mid abdominal mesenteric and inguinal uterus  is absent. No evident pelvic mass. Lymph nodes considered nonspecific. By size criteria, there is no adenopathy in the abdomen or pelvis. Reproductive: Uterus absent.  No pelvic mass. Other: Appendix appears normal. There is no ascites or abscess in the abdomen or pelvis. Musculoskeletal: There is degenerative change in the lower thoracic and lumbar spine. No evident blastic or lytic bone lesions. No intramuscular or abdominal wall lesions are evident. IMPRESSION: 1. Scattered colonic diverticula throughout colon. No diverticulitis. No bowel obstruction. No abscess. Appendix appears normal. 2.  No renal or ureteral calculi.  No  hydronephrosis. 3.  Gallbladder absent.  Uterus absent. Electronically Signed   By: Lowella Grip III M.D.   On: 10/06/2017 19:45    Procedures Procedures (including critical care time)  Medications Ordered in ED Medications  sodium chloride 0.9 % bolus 1,000 mL (1,000 mLs Intravenous New Bag/Given 10/06/17 1949)  morphine 4 MG/ML injection 4 mg (4 mg Intravenous Given 10/06/17 1949)  ketorolac (TORADOL) 30 MG/ML injection 30 mg (30 mg Intravenous Given 10/06/17 1949)  ondansetron (ZOFRAN) injection 4 mg (4 mg Intravenous Given 10/06/17 1949)     Initial Impression / Assessment and Plan / ED Course  I have reviewed the triage vital signs and the nursing notes.  Pertinent labs & imaging results that were available during my care of the patient were reviewed by me and considered in my medical decision making (see chart for details).    Pt is feeling better.  No kidney stone or uti.  Pt is stable for d/c.  Return if worse.  Final Clinical Impressions(s) / ED Diagnoses   Final diagnoses:  Left flank pain    ED Discharge Orders        Ordered    ibuprofen (ADVIL,MOTRIN) 600 MG tablet  Every 6 hours PRN     10/06/17 2033    HYDROcodone-acetaminophen (NORCO/VICODIN) 5-325 MG tablet  Every 4 hours PRN     10/06/17 2033       Isla Pence, MD 10/06/17 2034

## 2017-10-06 NOTE — ED Triage Notes (Signed)
PResents with left flank pain began yesterday, worse with movement, better with certain positions. Today pain became worse and is constant, described as sharp. dnies frequency, urgency, dysuria and hematuria.

## 2017-10-09 DIAGNOSIS — I4891 Unspecified atrial fibrillation: Secondary | ICD-10-CM | POA: Diagnosis not present

## 2017-10-09 DIAGNOSIS — E0590 Thyrotoxicosis, unspecified without thyrotoxic crisis or storm: Secondary | ICD-10-CM | POA: Diagnosis not present

## 2017-10-09 DIAGNOSIS — R109 Unspecified abdominal pain: Secondary | ICD-10-CM | POA: Diagnosis not present

## 2017-10-23 ENCOUNTER — Ambulatory Visit: Payer: BLUE CROSS/BLUE SHIELD | Admitting: Endocrinology

## 2017-10-29 ENCOUNTER — Other Ambulatory Visit: Payer: Self-pay | Admitting: Cardiovascular Disease

## 2017-10-29 ENCOUNTER — Telehealth: Payer: Self-pay | Admitting: *Deleted

## 2017-10-29 MED ORDER — APIXABAN 5 MG PO TABS: 5.0000 mg | ORAL_TABLET | Freq: Two times a day (BID) | ORAL | 11 refills | Status: DC

## 2017-10-29 MED ORDER — APIXABAN 5 MG PO TABS: 5.0000 mg | ORAL_TABLET | Freq: Two times a day (BID) | ORAL | 1 refills | Status: DC

## 2017-10-29 NOTE — Telephone Encounter (Signed)
Refill sent to [facility]

## 2017-11-16 DIAGNOSIS — D2261 Melanocytic nevi of right upper limb, including shoulder: Secondary | ICD-10-CM | POA: Diagnosis not present

## 2017-11-16 DIAGNOSIS — L304 Erythema intertrigo: Secondary | ICD-10-CM | POA: Diagnosis not present

## 2017-11-16 DIAGNOSIS — D485 Neoplasm of uncertain behavior of skin: Secondary | ICD-10-CM | POA: Diagnosis not present

## 2017-11-16 DIAGNOSIS — Z85820 Personal history of malignant melanoma of skin: Secondary | ICD-10-CM | POA: Diagnosis not present

## 2017-11-16 DIAGNOSIS — D225 Melanocytic nevi of trunk: Secondary | ICD-10-CM | POA: Diagnosis not present

## 2017-11-23 ENCOUNTER — Ambulatory Visit (INDEPENDENT_AMBULATORY_CARE_PROVIDER_SITE_OTHER): Payer: BLUE CROSS/BLUE SHIELD | Admitting: Endocrinology

## 2017-11-23 ENCOUNTER — Encounter: Payer: Self-pay | Admitting: Endocrinology

## 2017-11-23 VITALS — BP 128/72 | HR 56 | Ht 64.0 in | Wt 196.0 lb

## 2017-11-23 DIAGNOSIS — E0590 Thyrotoxicosis, unspecified without thyrotoxic crisis or storm: Secondary | ICD-10-CM | POA: Diagnosis not present

## 2017-11-23 LAB — T4, FREE: FREE T4: 0.4 ng/dL — AB (ref 0.8–1.8)

## 2017-11-23 LAB — TSH: TSH: 32.71 mIU/L — ABNORMAL HIGH (ref 0.40–4.50)

## 2017-11-23 NOTE — Patient Instructions (Addendum)
blood tests are requested for you today.  We'll let you know about the results.   If ever you have fever while taking methimazole, stop it and call us, even if the reason is obvious, because of the risk of a rare side-effect.  Please come back for a follow-up appointment in 2 months.   

## 2017-11-23 NOTE — Progress Notes (Signed)
Subjective:    Patient ID: Jean Hopkins, female    DOB: Aug 19, 1955, 62 y.o.   MRN: [id number]  HPI Pt returns for f/u of hyperthyroidism (dx'ed early 2019, in eval of AF; she has never had thyroid imaging, but Grave's Dz is presumed, due to severity; tapazole was chosen as initial rx, due to AF).  No recent AF.  pt states she feels well in general.  Past Medical History:  Diagnosis Date  . AF (atrial fibrillation) ([place])   . Hyperthyroidism     History reviewed. No pertinent surgical history.  Social History   Socioeconomic History  . Marital status: Married    Spouse name: Not on file  . Number of children: Not on file  . Years of education: Not on file  . Highest education level: Not on file  Occupational History  . Not on file  Social Needs  . Financial resource strain: Not on file  . Food insecurity:    Worry: Not on file    Inability: Not on file  . Transportation needs:    Medical: Not on file    Non-medical: Not on file  Tobacco Use  . Smoking status: Former Research scientist (life sciences)  . Smokeless tobacco: Never Used  Substance and Sexual Activity  . Alcohol use: Not on file  . Drug use: Yes    Types: "Crack" cocaine  . Sexual activity: Not on file  Lifestyle  . Physical activity:    Days per week: Not on file    Minutes per session: Not on file  . Stress: Not on file  Relationships  . Social connections:    Talks on phone: Not on file    Gets together: Not on file    Attends religious service: Not on file    Active member of club or organization: Not on file    Attends meetings of clubs or organizations: Not on file    Relationship status: Not on file  . Intimate partner violence:    Fear of current or ex partner: Not on file    Emotionally abused: Not on file    Physically abused: Not on file    Forced sexual activity: Not on file  Other Topics Concern  . Not on file  Social History Narrative  . Not on file    Current Outpatient Medications on File Prior to Visit    Medication Sig Dispense Refill  . apixaban (ELIQUIS) 5 MG TABS tablet Take 1 tablet (5 mg total) by mouth 2 (two) times daily. 180 tablet 1  . Cholecalciferol (VITAMIN D PO) Take 1 tablet by mouth daily.    . propranolol (INDERAL) 40 MG tablet Take 1 tablet (40 mg total) by mouth 2 (two) times daily. 60 tablet 3   No current facility-administered medications on file prior to visit.     No Known Allergies  Family History  Problem Relation Age of Onset  . Thyroid disease Neg Hx     BP 128/72 (BP Location: Left Arm, Patient Position: Sitting, Cuff Size: Normal)   Pulse (!) 56   Ht 5\' 4"  (1.626 m)   Wt 196 lb (88.9 kg)   SpO2 97%   BMI 33.64 kg/m    Review of Systems Denies fever    Objective:   Physical Exam VITAL SIGNS:  See vs page GENERAL: no distress NECK: Thyroid is twice normal size (R>L), vs bilat symmetrical nodules, but no palpable nodule.  No palpable lymphadenopathy at the anterior neck.  SKIN: not diaphoretic NEURO: no tremor      Assessment & Plan:  Hyperthyroidism: due for recheck Diffuse goiter, vs bilat symmetrical nodules: clinically unchanged  Patient Instructions  blood tests are requested for you today.  We'll let you know about the results.  If ever you have fever while taking methimazole, stop it and call us, even if the reason is obvious, because of the risk of a rare side-effect.  Please come back for a follow-up appointment in 2 months.

## 2017-11-24 MED ORDER — METHIMAZOLE 10 MG PO TABS: 10.0000 mg | ORAL_TABLET | Freq: Two times a day (BID) | ORAL | 1 refills | Status: DC

## 2017-12-09 ENCOUNTER — Other Ambulatory Visit (HOSPITAL_COMMUNITY): Payer: Self-pay | Admitting: Nurse Practitioner

## 2018-01-18 ENCOUNTER — Encounter: Payer: Self-pay | Admitting: Endocrinology

## 2018-01-18 ENCOUNTER — Ambulatory Visit (INDEPENDENT_AMBULATORY_CARE_PROVIDER_SITE_OTHER): Payer: BLUE CROSS/BLUE SHIELD | Admitting: Endocrinology

## 2018-01-18 VITALS — BP 106/68 | HR 61 | Ht 64.0 in | Wt 204.6 lb

## 2018-01-18 DIAGNOSIS — H25043 Posterior subcapsular polar age-related cataract, bilateral: Secondary | ICD-10-CM | POA: Diagnosis not present

## 2018-01-18 DIAGNOSIS — H5213 Myopia, bilateral: Secondary | ICD-10-CM | POA: Diagnosis not present

## 2018-01-18 DIAGNOSIS — E0590 Thyrotoxicosis, unspecified without thyrotoxic crisis or storm: Secondary | ICD-10-CM | POA: Diagnosis not present

## 2018-01-18 DIAGNOSIS — H2513 Age-related nuclear cataract, bilateral: Secondary | ICD-10-CM | POA: Diagnosis not present

## 2018-01-18 LAB — TSH: TSH: 45.65 u[IU]/mL — ABNORMAL HIGH (ref 0.35–4.50)

## 2018-01-18 LAB — T4, FREE: FREE T4: 0.29 ng/dL — AB (ref 0.60–1.60)

## 2018-01-18 NOTE — Patient Instructions (Addendum)
Thyroid blood tests are requested for you today.  We'll let you know about the results. If ever you have fever while taking methimazole, stop it and call us, even if the reason is obvious, because of the risk of a rare side-effect.  Please come back for a follow-up appointment in 2 months.  

## 2018-01-18 NOTE — Progress Notes (Signed)
Subjective:    Patient ID: Jean Hopkins, female    DOB: 1956-02-25, 62 y.o.   MRN: [id number]  HPI Pt returns for f/u of hyperthyroidism (dx'ed early 2019, in eval of AF; she has never had thyroid imaging, but Grave's Dz is presumed, due to severity; tapazole was chosen as initial rx, due to AF).  No recent AF.  pt states she feels well in general.  Past Medical History:  Diagnosis Date  . AF (atrial fibrillation) ([place])   . Hyperthyroidism     No past surgical history on file.  Social History   Socioeconomic History  . Marital status: Married    Spouse name: Not on file  . Number of children: Not on file  . Years of education: Not on file  . Highest education level: Not on file  Occupational History  . Not on file  Social Needs  . Financial resource strain: Not on file  . Food insecurity:    Worry: Not on file    Inability: Not on file  . Transportation needs:    Medical: Not on file    Non-medical: Not on file  Tobacco Use  . Smoking status: Former Research scientist (life sciences)  . Smokeless tobacco: Never Used  Substance and Sexual Activity  . Alcohol use: Not on file  . Drug use: Yes    Types: "Crack" cocaine  . Sexual activity: Not on file  Lifestyle  . Physical activity:    Days per week: Not on file    Minutes per session: Not on file  . Stress: Not on file  Relationships  . Social connections:    Talks on phone: Not on file    Gets together: Not on file    Attends religious service: Not on file    Active member of club or organization: Not on file    Attends meetings of clubs or organizations: Not on file    Relationship status: Not on file  . Intimate partner violence:    Fear of current or ex partner: Not on file    Emotionally abused: Not on file    Physically abused: Not on file    Forced sexual activity: Not on file  Other Topics Concern  . Not on file  Social History Narrative  . Not on file    Current Outpatient Medications on File Prior to Visit  Medication  Sig Dispense Refill  . apixaban (ELIQUIS) 5 MG TABS tablet Take 1 tablet (5 mg total) by mouth 2 (two) times daily. 180 tablet 1  . Cholecalciferol (VITAMIN D PO) Take 1 tablet by mouth daily.    . methimazole (TAPAZOLE) 10 MG tablet Take 1 tablet (10 mg total) by mouth 2 (two) times daily. 60 tablet 1  . propranolol (INDERAL) 40 MG tablet TAKE ONE TABLET BY MOUTH TWICE A DAY 60 tablet 7   No current facility-administered medications on file prior to visit.     No Known Allergies  Family History  Problem Relation Age of Onset  . Thyroid disease Neg Hx     BP 106/68 (BP Location: Right Arm, Patient Position: Sitting, Cuff Size: Normal)   Pulse 61   Ht 5\' 4"  (1.626 m)   Wt 204 lb 9.6 oz (92.8 kg)   SpO2 98%   BMI 35.12 kg/m    Review of Systems Denies fever    Objective:   Physical Exam VITAL SIGNS:  See vs page GENERAL: no distress NECK: Thyroid is approx 5 times  normal size (R>L), vs bilat symmetrical nodules, but no palpable nodule.  No palpable lymphadenopathy at the anterior neck.  SKIN: not diaphoretic NEURO: no tremor      Assessment & Plan:  Hyperthyroidism: due for recheck: she declines RAI, at least for now.   AF: better: plan is to recheck TFT is AF recurs sooner  Patient Instructions  Thyroid blood tests are requested for you today.  We'll let you know about the results.  If ever you have fever while taking methimazole, stop it and call us, even if the reason is obvious, because of the risk of a rare side-effect.  Please come back for a follow-up appointment in 2 months.

## 2018-02-05 ENCOUNTER — Other Ambulatory Visit (INDEPENDENT_AMBULATORY_CARE_PROVIDER_SITE_OTHER): Payer: BLUE CROSS/BLUE SHIELD

## 2018-02-05 ENCOUNTER — Other Ambulatory Visit: Payer: Self-pay | Admitting: Endocrinology

## 2018-02-05 DIAGNOSIS — E0590 Thyrotoxicosis, unspecified without thyrotoxic crisis or storm: Secondary | ICD-10-CM | POA: Diagnosis not present

## 2018-02-05 LAB — T4, FREE: Free T4: 1.08 ng/dL (ref 0.60–1.60)

## 2018-02-05 LAB — TSH: TSH: 2.36 u[IU]/mL (ref 0.35–4.50)

## 2018-03-21 ENCOUNTER — Ambulatory Visit (INDEPENDENT_AMBULATORY_CARE_PROVIDER_SITE_OTHER): Payer: BLUE CROSS/BLUE SHIELD | Admitting: Cardiovascular Disease

## 2018-03-21 ENCOUNTER — Encounter: Payer: Self-pay | Admitting: Cardiovascular Disease

## 2018-03-21 VITALS — BP 134/82 | HR 57 | Ht 64.0 in | Wt 216.6 lb

## 2018-03-21 DIAGNOSIS — I480 Paroxysmal atrial fibrillation: Secondary | ICD-10-CM | POA: Diagnosis not present

## 2018-03-21 MED ORDER — PROPRANOLOL HCL 20 MG PO TABS: 20.0000 mg | ORAL_TABLET | Freq: Two times a day (BID) | ORAL | 3 refills | Status: DC

## 2018-03-21 NOTE — Patient Instructions (Signed)
Your physician has recommended making the following medication changes: 1. DECREASE Propranolol to 20 mg twice daily  Dr Sallyanne Kuster recommends that you schedule a follow-up appointment in 12 months. You will receive a reminder letter in the mail two months in advance. If you don't receive a letter, please call our office to schedule the follow-up appointment.  If you need a refill on your cardiac medications before your next appointment, please call your pharmacy.

## 2018-03-21 NOTE — Progress Notes (Signed)
Cardiology Office Note:    Date:  03/21/2018   ID:  ABBYGAIL Hopkins, DOB 09/24/1955, MRN [id number]  PCP:  Leighton Ruff, MD  Cardiologist:  New  Referring MD: Leighton Ruff, MD   Chief Complaint  Patient presents with  . Follow-up    pt denied chest pain   History of Present Illness:    Jean Hopkins is a 62 y.o. female with a hx of paroxysmal atrial fibrillation in the setting of thyrotoxicosis.  After starting treatment with antithyroid medications her thyrotoxicosis resolved and she has not had any symptoms of atrial fibrillation whatsoever.  In fact, she became hypothyroid and her methimazole was stopped altogether.  She has an appointment with her endocrinologist tomorrow.  The patient specifically denies any chest pain at rest or with exertion, dyspnea at rest or with exertion, orthopnea, paroxysmal nocturnal dyspnea, syncope, palpitations, focal neurological deficits, intermittent claudication, lower extremity edema, unexplained weight gain, cough, hemoptysis or wheezing.  The patient also denies abdominal pain, nausea, vomiting, dysphagia, diarrhea, constipation, polyuria, polydipsia, dysuria, hematuria, frequency, urgency, abnormal bleeding or bruising, fever, chills, unexpected weight changes, mood swings, change in skin or hair texture, change in voice quality, auditory or visual problems, allergic reactions or rashes, new musculoskeletal complaints.  Note that she has gained 20 pounds since treatment of her thyrotoxicosis began earlier this year.   Past Medical History:  Diagnosis Date  . AF (atrial fibrillation) ([place])   . Hyperthyroidism     No past surgical history  Current Medications: Current Meds  Medication Sig  . apixaban (ELIQUIS) 5 MG TABS tablet Take 1 tablet (5 mg total) by mouth 2 (two) times daily.  . Cholecalciferol (VITAMIN D PO) Take 1 tablet by mouth daily.  . propranolol (INDERAL) 20 MG tablet Take 1 tablet (20 mg total) by mouth 2 (two) times  daily.  . [DISCONTINUED] propranolol (INDERAL) 40 MG tablet TAKE ONE TABLET BY MOUTH TWICE A DAY     Allergies:   Patient has no known allergies.   Social History   Socioeconomic History  . Marital status: Married    Spouse name: Not on file  . Number of children: Not on file  . Years of education: Not on file  . Highest education level: Not on file  Occupational History  . Not on file  Social Needs  . Financial resource strain: Not on file  . Food insecurity:    Worry: Not on file    Inability: Not on file  . Transportation needs:    Medical: Not on file    Non-medical: Not on file  Tobacco Use  . Smoking status: Former Research scientist (life sciences)  . Smokeless tobacco: Never Used  Substance and Sexual Activity  . Alcohol use: Not on file  . Drug use: Yes    Types: "Crack" cocaine  . Sexual activity: Not on file  Lifestyle  . Physical activity:    Days per week: Not on file    Minutes per session: Not on file  . Stress: Not on file  Relationships  . Social connections:    Talks on phone: Not on file    Gets together: Not on file    Attends religious service: Not on file    Active member of club or organization: Not on file    Attends meetings of clubs or organizations: Not on file    Relationship status: Not on file  Other Topics Concern  . Not on file  Social History Narrative  .  Not on file     Family History: The patient's family history is negative for Thyroid disease.  Negative for atrial fibrillation  ROS:   Please see the history of present illness.    All other systems are reviewed and are negative  EKGs/Labs/Other Studies Reviewed:      EKG:  EKG is ordered today.  Shows mild sinus bradycardia, otherwise normal tracing  Recent Labs: 07/29/2017: Magnesium 1.8 10/06/2017: BUN 21; Creatinine, Ser 0.81; Hemoglobin 14.8; Platelets 280; Potassium 3.8; Sodium 140 02/05/2018: TSH 2.36  Recent Lipid Panel No results found for: CHOL, TRIG, HDL, CHOLHDL, VLDL, LDLCALC,  LDLDIRECT  Physical Exam:    VS:  BP 134/82   Pulse (!) 57   Ht 5\' 4"  (1.626 m)   Wt 216 lb 9.6 oz (98.2 kg)   BMI 37.18 kg/m     Wt Readings from Last 3 Encounters:  03/21/18 216 lb 9.6 oz (98.2 kg)  01/18/18 204 lb 9.6 oz (92.8 kg)  11/23/17 196 lb (88.9 kg)     General: Alert, oriented x3, no distress, severely obese Head: no evidence of trauma, PERRL, EOMI, no exophtalmos or lid lag, no myxedema, no xanthelasma; normal ears, nose and oropharynx Neck: normal jugular venous pulsations and no hepatojugular reflux; brisk carotid pulses without delay and no carotid bruits Chest: clear to auscultation, no signs of consolidation by percussion or palpation, normal fremitus, symmetrical and full respiratory excursions Cardiovascular: normal position and quality of the apical impulse, regular rhythm, normal first and second heart sounds, no murmurs, rubs or gallops Abdomen: no tenderness or distention, no masses by palpation, no abnormal pulsatility or arterial bruits, normal bowel sounds, no hepatosplenomegaly Extremities: no clubbing, cyanosis or edema; 2+ radial, ulnar and brachial pulses bilaterally; 2+ right femoral, posterior tibial and dorsalis pedis pulses; 2+ left femoral, posterior tibial and dorsalis pedis pulses; no subclavian or femoral bruits Neurological: grossly nonfocal Psych: Normal mood and affect   ASSESSMENT:    1. Paroxysmal atrial fibrillation (HCC)    PLAN:    In order of problems listed above:  1. AFib: Secondary to thyrotoxicosis.  No evidence of any significant structural heart disease on echo.  At most borderline atrial enlargement.  Appropriately anticoagulated.  She is currently mildly bradycardic and might still be a little hypothyroid.  It appears that she may not require ablative therapy for her thyroid.  I would recommend reducing the dose of propranolol to 20 mg twice daily and will plan to gradually wean this off through the end of the year.   Especially if there is no plan for surgery or radioactive iodine ablation, it is unlikely that atrial fibrillation will return while she is euthyroid. Would then recommend discontinuation of the anticoagulant, probably before the end of the year.  She has an appointment tomorrow with Dr. Renato Shin and repeat thyroid labs will be checked.   Medication Adjustments/Labs and Tests Ordered: Current medicines are reviewed at length with the patient today.  Concerns regarding medicines are outlined above.  Orders Placed This Encounter  Procedures  . EKG 12-Lead   Meds ordered this encounter  Medications  . propranolol (INDERAL) 20 MG tablet    Sig: Take 1 tablet (20 mg total) by mouth 2 (two) times daily.    Dispense:  180 tablet    Refill:  3    Signed, Sanda Klein, MD  03/21/2018 9:20 AM     [facility]

## 2018-03-22 ENCOUNTER — Encounter: Payer: Self-pay | Admitting: Endocrinology

## 2018-03-22 ENCOUNTER — Ambulatory Visit (INDEPENDENT_AMBULATORY_CARE_PROVIDER_SITE_OTHER): Payer: BLUE CROSS/BLUE SHIELD | Admitting: Endocrinology

## 2018-03-22 VITALS — BP 104/78 | HR 65 | Ht 64.0 in | Wt 213.4 lb

## 2018-03-22 DIAGNOSIS — E0590 Thyrotoxicosis, unspecified without thyrotoxic crisis or storm: Secondary | ICD-10-CM | POA: Diagnosis not present

## 2018-03-22 LAB — TSH: TSH: 0.82 u[IU]/mL (ref 0.35–4.50)

## 2018-03-22 LAB — T4, FREE: Free T4: 1.21 ng/dL (ref 0.60–1.60)

## 2018-03-22 NOTE — Patient Instructions (Signed)
Thyroid blood tests are requested for you today.  We'll let you know about the results. If ever you have fever while taking methimazole, stop it and call us, even if the reason is obvious, because of the risk of a rare side-effect.  Please come back for a follow-up appointment in 2 months.  

## 2018-03-22 NOTE — Progress Notes (Signed)
Subjective:    Patient ID: Jean Hopkins, female    DOB: 04-12-1956, 62 y.o.   MRN: [id number]  HPI Pt returns for f/u of hyperthyroidism (dx'ed early 2019, in eval of AF; she has never had thyroid imaging, but Grave's Dz is presumed, due to severity; tapazole was chosen as initial rx, due to AF; it was stopped in mid-2019, due to hypothyroidism).  No recent AF.  pt states she feels well in general. Specifically, she denies palpitations and tremor.   Past Medical History:  Diagnosis Date  . AF (atrial fibrillation) ([place])   . Hyperthyroidism     No past surgical history on file.  Social History   Socioeconomic History  . Marital status: Married    Spouse name: Not on file  . Number of children: Not on file  . Years of education: Not on file  . Highest education level: Not on file  Occupational History  . Not on file  Social Needs  . Financial resource strain: Not on file  . Food insecurity:    Worry: Not on file    Inability: Not on file  . Transportation needs:    Medical: Not on file    Non-medical: Not on file  Tobacco Use  . Smoking status: Former Research scientist (life sciences)  . Smokeless tobacco: Never Used  Substance and Sexual Activity  . Alcohol use: Not on file  . Drug use: Yes    Types: "Crack" cocaine  . Sexual activity: Not on file  Lifestyle  . Physical activity:    Days per week: Not on file    Minutes per session: Not on file  . Stress: Not on file  Relationships  . Social connections:    Talks on phone: Not on file    Gets together: Not on file    Attends religious service: Not on file    Active member of club or organization: Not on file    Attends meetings of clubs or organizations: Not on file    Relationship status: Not on file  . Intimate partner violence:    Fear of current or ex partner: Not on file    Emotionally abused: Not on file    Physically abused: Not on file    Forced sexual activity: Not on file  Other Topics Concern  . Not on file  Social History  Narrative  . Not on file    Current Outpatient Medications on File Prior to Visit  Medication Sig Dispense Refill  . apixaban (ELIQUIS) 5 MG TABS tablet Take 1 tablet (5 mg total) by mouth 2 (two) times daily. 180 tablet 1  . Cholecalciferol (VITAMIN D PO) Take 1 tablet by mouth daily.    . propranolol (INDERAL) 20 MG tablet Take 1 tablet (20 mg total) by mouth 2 (two) times daily. 180 tablet 3   No current facility-administered medications on file prior to visit.     No Known Allergies  Family History  Problem Relation Age of Onset  . Thyroid disease Neg Hx     BP 104/78 (BP Location: Left Arm)   Pulse 65   Ht 5\' 4"  (1.626 m)   Wt 213 lb 6.4 oz (96.8 kg)   SpO2 98%   BMI 36.63 kg/m    Review of Systems Denies fever    Objective:   Physical Exam VITAL SIGNS:  See vs page GENERAL: no distress NECK: thyroid is several times normal size.  Diffuse.     Lab Results  Component Value Date   TSH 0.82 03/22/2018   T4TOTAL 11.6 07/29/2017      Assessment & Plan:  Hyperthyroidism: recheck today AF: in this setting, she needs to maintain euthyroidism  Patient Instructions  Thyroid blood tests are requested for you today.  We'll let you know about the results.  If ever you have fever while taking methimazole, stop it and call us, even if the reason is obvious, because of the risk of a rare side-effect.  Please come back for a follow-up appointment in 2 months.

## 2018-04-04 DIAGNOSIS — J069 Acute upper respiratory infection, unspecified: Secondary | ICD-10-CM | POA: Diagnosis not present

## 2018-04-04 DIAGNOSIS — R509 Fever, unspecified: Secondary | ICD-10-CM | POA: Diagnosis not present

## 2018-04-30 ENCOUNTER — Other Ambulatory Visit (HOSPITAL_BASED_OUTPATIENT_CLINIC_OR_DEPARTMENT_OTHER): Payer: Self-pay | Admitting: Family Medicine

## 2018-04-30 DIAGNOSIS — Z1231 Encounter for screening mammogram for malignant neoplasm of breast: Secondary | ICD-10-CM

## 2018-05-06 ENCOUNTER — Ambulatory Visit (HOSPITAL_BASED_OUTPATIENT_CLINIC_OR_DEPARTMENT_OTHER)
Admission: RE | Admit: 2018-05-06 | Discharge: 2018-05-06 | Disposition: A | Discharge status: Home / Self Care | Payer: BLUE CROSS/BLUE SHIELD | Source: Ambulatory Visit | Attending: Family Medicine | Admitting: Family Medicine

## 2018-05-06 DIAGNOSIS — Z1231 Encounter for screening mammogram for malignant neoplasm of breast: Secondary | ICD-10-CM | POA: Diagnosis not present

## 2018-05-27 ENCOUNTER — Encounter: Payer: Self-pay | Admitting: Endocrinology

## 2018-05-27 ENCOUNTER — Ambulatory Visit (INDEPENDENT_AMBULATORY_CARE_PROVIDER_SITE_OTHER): Payer: BLUE CROSS/BLUE SHIELD | Admitting: Endocrinology

## 2018-05-27 VITALS — BP 120/70 | HR 65 | Ht 64.0 in | Wt 214.6 lb

## 2018-05-27 DIAGNOSIS — E0590 Thyrotoxicosis, unspecified without thyrotoxic crisis or storm: Secondary | ICD-10-CM

## 2018-05-27 LAB — T4, FREE: Free T4: 0.91 ng/dL (ref 0.60–1.60)

## 2018-05-27 LAB — TSH: TSH: 0.41 u[IU]/mL (ref 0.35–4.50)

## 2018-05-27 NOTE — Progress Notes (Signed)
Subjective:    Patient ID: Jean Hopkins, female    DOB: [birth year], 62 y.o.   MRN: [id number]  HPI Pt returns for f/u of hyperthyroidism (dx'ed early 2019, in eval of AF; she has never had thyroid imaging, but Grave's Dz is presumed, due to severity; tapazole was chosen as initial rx, due to AF; it was stopped in mid-2019, due to hypothyroidism; since then, she has been euthyroid off rx).  No recent AF.  pt states she feels well in general.  she denies palpitations.   Past Medical History:  Diagnosis Date  . AF (atrial fibrillation) ([place])   . Hyperthyroidism     No past surgical history on file.  Social History   Socioeconomic History  . Marital status: Married    Spouse name: Not on file  . Number of children: Not on file  . Years of education: Not on file  . Highest education level: Not on file  Occupational History  . Not on file  Social Needs  . Financial resource strain: Not on file  . Food insecurity:    Worry: Not on file    Inability: Not on file  . Transportation needs:    Medical: Not on file    Non-medical: Not on file  Tobacco Use  . Smoking status: Former Research scientist (life sciences)  . Smokeless tobacco: Never Used  Substance and Sexual Activity  . Alcohol use: Not on file  . Drug use: Yes    Types: "Crack" cocaine  . Sexual activity: Not on file  Lifestyle  . Physical activity:    Days per week: Not on file    Minutes per session: Not on file  . Stress: Not on file  Relationships  . Social connections:    Talks on phone: Not on file    Gets together: Not on file    Attends religious service: Not on file    Active member of club or organization: Not on file    Attends meetings of clubs or organizations: Not on file    Relationship status: Not on file  . Intimate partner violence:    Fear of current or ex partner: Not on file    Emotionally abused: Not on file    Physically abused: Not on file    Forced sexual activity: Not on file  Other Topics Concern  . Not on  file  Social History Narrative  . Not on file    Current Outpatient Medications on File Prior to Visit  Medication Sig Dispense Refill  . apixaban (ELIQUIS) 5 MG TABS tablet Take 1 tablet (5 mg total) by mouth 2 (two) times daily. 180 tablet 1  . Cholecalciferol (VITAMIN D PO) Take 1 tablet by mouth daily.    . propranolol (INDERAL) 20 MG tablet Take 1 tablet (20 mg total) by mouth 2 (two) times daily. (Patient taking differently: Take 10 mg by mouth 2 (two) times daily. ) 180 tablet 3   No current facility-administered medications on file prior to visit.     No Known Allergies  Family History  Problem Relation Age of Onset  . Thyroid disease Neg Hx     BP 120/70 (BP Location: Right Arm, Patient Position: Sitting, Cuff Size: Large)   Pulse 65   Ht 5\' 4"  (1.626 m)   Wt 214 lb 9.6 oz (97.3 kg)   SpO2 98%   BMI 36.84 kg/m    Review of Systems Denies leg swelling and tremor.  Objective:   Physical Exam VITAL SIGNS:  See vs page GENERAL: no distress NECK: thyroid is slightly enlarged (R>L).  No palpable nodule.    Lab Results  Component Value Date   TSH 0.41 05/27/2018   T4TOTAL 11.6 07/29/2017      Assessment & Plan:  Hyperthyroidism: still normal off rx, but she is at risk for recurrence. AF: she needs to maintain euthyroidism.  Patient Instructions  Thyroid blood tests are requested for you today.  We'll let you know about the results.  Please come back for a follow-up appointment in 3 months.

## 2018-05-27 NOTE — Patient Instructions (Signed)
Thyroid blood tests are requested for you today.  We'll let you know about the results.  Please come back for a follow-up appointment in 3 months.  

## 2018-06-03 DIAGNOSIS — D225 Melanocytic nevi of trunk: Secondary | ICD-10-CM | POA: Diagnosis not present

## 2018-06-03 DIAGNOSIS — D2261 Melanocytic nevi of right upper limb, including shoulder: Secondary | ICD-10-CM | POA: Diagnosis not present

## 2018-06-03 DIAGNOSIS — Z85820 Personal history of malignant melanoma of skin: Secondary | ICD-10-CM | POA: Diagnosis not present

## 2018-06-03 DIAGNOSIS — L821 Other seborrheic keratosis: Secondary | ICD-10-CM | POA: Diagnosis not present

## 2018-07-01 DIAGNOSIS — E559 Vitamin D deficiency, unspecified: Secondary | ICD-10-CM | POA: Diagnosis not present

## 2018-07-01 DIAGNOSIS — Z0000 Encounter for general adult medical examination without abnormal findings: Secondary | ICD-10-CM | POA: Diagnosis not present

## 2018-07-01 DIAGNOSIS — Z23 Encounter for immunization: Secondary | ICD-10-CM | POA: Diagnosis not present

## 2018-07-01 DIAGNOSIS — E119 Type 2 diabetes mellitus without complications: Secondary | ICD-10-CM | POA: Diagnosis not present

## 2018-08-26 ENCOUNTER — Encounter: Payer: Self-pay | Admitting: Endocrinology

## 2018-08-26 ENCOUNTER — Ambulatory Visit (INDEPENDENT_AMBULATORY_CARE_PROVIDER_SITE_OTHER): Payer: BLUE CROSS/BLUE SHIELD | Admitting: Endocrinology

## 2018-08-26 ENCOUNTER — Other Ambulatory Visit: Payer: Self-pay

## 2018-08-26 VITALS — BP 130/80 | HR 87 | Ht 64.0 in | Wt 218.4 lb

## 2018-08-26 DIAGNOSIS — E0590 Thyrotoxicosis, unspecified without thyrotoxic crisis or storm: Secondary | ICD-10-CM | POA: Diagnosis not present

## 2018-08-26 LAB — TSH: TSH: 0.03 u[IU]/mL — ABNORMAL LOW (ref 0.35–4.50)

## 2018-08-26 LAB — T4, FREE: Free T4: 1.06 ng/dL (ref 0.60–1.60)

## 2018-08-26 MED ORDER — METHIMAZOLE 5 MG PO TABS: 5.0000 mg | ORAL_TABLET | Freq: Every day | ORAL | 2 refills | Status: DC

## 2018-08-26 NOTE — Patient Instructions (Signed)
Thyroid blood tests are requested for you today.  We'll let you know about the results.  Please come back for a follow-up appointment in 4 months.

## 2018-08-26 NOTE — Progress Notes (Signed)
Subjective:    Patient ID: Jean Hopkins, female    DOB: 10/16/1955, 63 y.o.   MRN: [id number]  HPI Pt returns for f/u of hyperthyroidism (dx'ed early 2019, in eval of AF; she has never had thyroid imaging, but Grave's Dz is presumed, due to severity; tapazole was chosen as initial rx, due to AF; it was stopped in mid-2019, due to hypothyroidism; since then, she has been euthyroid off rx).  No recent AF.  pt states she feels well in general.  Specifically, she denies palpitations.   Past Medical History:  Diagnosis Date  . AF (atrial fibrillation) ([place])   . Hyperthyroidism     No past surgical history on file.  Social History   Socioeconomic History  . Marital status: Married    Spouse name: Not on file  . Number of children: Not on file  . Years of education: Not on file  . Highest education level: Not on file  Occupational History  . Not on file  Social Needs  . Financial resource strain: Not on file  . Food insecurity:    Worry: Not on file    Inability: Not on file  . Transportation needs:    Medical: Not on file    Non-medical: Not on file  Tobacco Use  . Smoking status: Former Research scientist (life sciences)  . Smokeless tobacco: Never Used  Substance and Sexual Activity  . Alcohol use: Not on file  . Drug use: Yes    Types: "Crack" cocaine  . Sexual activity: Not on file  Lifestyle  . Physical activity:    Days per week: Not on file    Minutes per session: Not on file  . Stress: Not on file  Relationships  . Social connections:    Talks on phone: Not on file    Gets together: Not on file    Attends religious service: Not on file    Active member of club or organization: Not on file    Attends meetings of clubs or organizations: Not on file    Relationship status: Not on file  . Intimate partner violence:    Fear of current or ex partner: Not on file    Emotionally abused: Not on file    Physically abused: Not on file    Forced sexual activity: Not on file  Other Topics Concern   . Not on file  Social History Narrative  . Not on file    Current Outpatient Medications on File Prior to Visit  Medication Sig Dispense Refill  . Cholecalciferol (VITAMIN D PO) Take 1 tablet by mouth daily.     No current facility-administered medications on file prior to visit.     No Known Allergies  Family History  Problem Relation Age of Onset  . Thyroid disease Neg Hx     BP 130/80 (BP Location: Right Arm, Patient Position: Sitting, Cuff Size: Large)   Pulse 87   Ht 5\' 4"  (1.626 m)   Wt 218 lb 6.4 oz (99.1 kg)   SpO2 97%   BMI 37.49 kg/m    Review of Systems Denies leg swelling.      Objective:   Physical Exam VITAL SIGNS:  See vs page GENERAL: no distress NECK: thyroid is slightly enlarged (R>L).  No palpable nodule.    Lab Results  Component Value Date   TSH 0.03 (L) 08/26/2018   T4TOTAL 11.6 07/29/2017      Assessment & Plan:  Hyperthyroidism: recurrent off rx.  Resume tapazole Please come back for a follow-up appointment in 2 months.

## 2018-08-27 ENCOUNTER — Encounter: Payer: Self-pay | Admitting: Endocrinology

## 2018-10-23 ENCOUNTER — Encounter: Payer: Self-pay | Admitting: Endocrinology

## 2018-10-23 NOTE — Telephone Encounter (Signed)
Please advise 

## 2018-10-28 ENCOUNTER — Ambulatory Visit: Payer: BLUE CROSS/BLUE SHIELD | Admitting: Endocrinology

## 2018-10-28 ENCOUNTER — Other Ambulatory Visit: Payer: Self-pay

## 2018-10-28 ENCOUNTER — Ambulatory Visit (INDEPENDENT_AMBULATORY_CARE_PROVIDER_SITE_OTHER): Payer: BLUE CROSS/BLUE SHIELD | Admitting: Endocrinology

## 2018-10-28 VITALS — BP 116/80 | HR 78 | Temp 97.7°F | Wt 219.0 lb

## 2018-10-28 DIAGNOSIS — E0590 Thyrotoxicosis, unspecified without thyrotoxic crisis or storm: Secondary | ICD-10-CM | POA: Diagnosis not present

## 2018-10-28 LAB — TSH: TSH: 0.79 u[IU]/mL (ref 0.35–4.50)

## 2018-10-28 LAB — T4, FREE: Free T4: 1.06 ng/dL (ref 0.60–1.60)

## 2018-10-28 NOTE — Progress Notes (Signed)
Subjective:    Patient ID: Jean Hopkins, female    DOB: May 12, 1956, 63 y.o.   MRN: [id number]  HPI Pt returns for f/u of hyperthyroidism (dx'ed early 2019, in eval of AF; she has never had thyroid imaging, but Grave's Dz is presumed, due to severity; tapazole was chosen as initial rx, due to AF; it was stopped in mid-2019, due to hypothyroidism; since then, she has been euthyroid off rx).  No recent AF.  pt states she feels well in general.  Specifically, she denies palpitations.   Past Medical History:  Diagnosis Date  . AF (atrial fibrillation) ([place])   . Hyperthyroidism     No past surgical history on file.  Social History   Socioeconomic History  . Marital status: Married    Spouse name: Not on file  . Number of children: Not on file  . Years of education: Not on file  . Highest education level: Not on file  Occupational History  . Not on file  Social Needs  . Financial resource strain: Not on file  . Food insecurity:    Worry: Not on file    Inability: Not on file  . Transportation needs:    Medical: Not on file    Non-medical: Not on file  Tobacco Use  . Smoking status: Former Research scientist (life sciences)  . Smokeless tobacco: Never Used  Substance and Sexual Activity  . Alcohol use: Not on file  . Drug use: Yes    Types: "Crack" cocaine  . Sexual activity: Not on file  Lifestyle  . Physical activity:    Days per week: Not on file    Minutes per session: Not on file  . Stress: Not on file  Relationships  . Social connections:    Talks on phone: Not on file    Gets together: Not on file    Attends religious service: Not on file    Active member of club or organization: Not on file    Attends meetings of clubs or organizations: Not on file    Relationship status: Not on file  . Intimate partner violence:    Fear of current or ex partner: Not on file    Emotionally abused: Not on file    Physically abused: Not on file    Forced sexual activity: Not on file  Other Topics Concern   . Not on file  Social History Narrative  . Not on file    Current Outpatient Medications on File Prior to Visit  Medication Sig Dispense Refill  . Cholecalciferol (VITAMIN D PO) Take 1 tablet by mouth daily.    . methimazole (TAPAZOLE) 5 MG tablet Take 1 tablet (5 mg total) by mouth daily. 30 tablet 2   No current facility-administered medications on file prior to visit.     No Known Allergies  Family History  Problem Relation Age of Onset  . Thyroid disease Neg Hx     BP 116/80 (BP Location: Right Arm, Patient Position: Sitting, Cuff Size: Large)   Pulse 78   Temp 97.7 F (36.5 C) (Oral)   Wt 219 lb (99.3 kg)   SpO2 95%   BMI 37.59 kg/m    Review of Systems Denies fever.      Objective:   Physical Exam VITAL SIGNS:  See vs page GENERAL: no distress NECK: thyroid is slightly enlarged, with ? Of left 1-2 cm nodule.   Lab Results  Component Value Date   TSH 0.79 10/28/2018  T4TOTAL 11.6 07/29/2017      Assessment & Plan:  Hyperthyroidism: well-controlled Thyroid nodule is noted.  New.  We discussed low risk in view of hyperthyroidism.    Patient Instructions  Blood tests are requested for you today.  We'll let you know about the results.  Let's plan to check the ultrasound when it becomes available.  Please come back for a follow-up appointment in 2-3 months.

## 2018-10-28 NOTE — Patient Instructions (Signed)
Blood tests are requested for you today.  We'll let you know about the results.  Let's plan to check the ultrasound when it becomes available.  Please come back for a follow-up appointment in 2-3 months.

## 2018-10-29 ENCOUNTER — Ambulatory Visit: Payer: BLUE CROSS/BLUE SHIELD | Admitting: Endocrinology

## 2018-11-20 ENCOUNTER — Other Ambulatory Visit: Payer: Self-pay | Admitting: Endocrinology

## 2018-12-25 ENCOUNTER — Ambulatory Visit: Payer: BLUE CROSS/BLUE SHIELD | Admitting: Endocrinology

## 2018-12-30 ENCOUNTER — Telehealth: Payer: Self-pay | Admitting: Endocrinology

## 2018-12-30 DIAGNOSIS — E0590 Thyrotoxicosis, unspecified without thyrotoxic crisis or storm: Secondary | ICD-10-CM

## 2018-12-30 NOTE — Telephone Encounter (Signed)
Please advise 

## 2018-12-30 NOTE — Telephone Encounter (Signed)
Patient has called in regards to wanting to go ahead and do her ultrasound that was discussed. Patient also wanted to know if she could do it at the [facility]?  Please Advise, Thanks

## 2018-12-30 NOTE — Telephone Encounter (Signed)
Pt is wanting her [place] at [place] [facility]. Does this go into their work que?

## 2018-12-30 NOTE — Telephone Encounter (Signed)
Ok, I ordered 

## 2019-01-02 ENCOUNTER — Other Ambulatory Visit: Payer: Self-pay

## 2019-01-02 ENCOUNTER — Ambulatory Visit (HOSPITAL_BASED_OUTPATIENT_CLINIC_OR_DEPARTMENT_OTHER)
Admission: RE | Admit: 2019-01-02 | Discharge: 2019-01-02 | Disposition: A | Discharge status: Home / Self Care | Payer: BC Managed Care – PPO | Source: Ambulatory Visit | Attending: Endocrinology | Admitting: Endocrinology

## 2019-01-02 DIAGNOSIS — E042 Nontoxic multinodular goiter: Secondary | ICD-10-CM | POA: Diagnosis not present

## 2019-01-02 DIAGNOSIS — E0590 Thyrotoxicosis, unspecified without thyrotoxic crisis or storm: Secondary | ICD-10-CM | POA: Diagnosis not present

## 2019-01-08 ENCOUNTER — Other Ambulatory Visit: Payer: Self-pay

## 2019-01-13 ENCOUNTER — Encounter: Payer: Self-pay | Admitting: Endocrinology

## 2019-01-13 ENCOUNTER — Other Ambulatory Visit: Payer: Self-pay

## 2019-01-13 ENCOUNTER — Ambulatory Visit (INDEPENDENT_AMBULATORY_CARE_PROVIDER_SITE_OTHER): Payer: BC Managed Care – PPO | Admitting: Endocrinology

## 2019-01-13 VITALS — BP 118/70 | HR 80 | Ht 64.0 in | Wt 225.2 lb

## 2019-01-13 DIAGNOSIS — E0590 Thyrotoxicosis, unspecified without thyrotoxic crisis or storm: Secondary | ICD-10-CM

## 2019-01-13 LAB — T4, FREE: Free T4: 0.82 ng/dL (ref 0.60–1.60)

## 2019-01-13 LAB — TSH: TSH: 2.78 u[IU]/mL (ref 0.35–4.50)

## 2019-01-13 NOTE — Patient Instructions (Addendum)
Blood tests are requested for you today.  We'll let you know about the results.  If ever you have fever while taking methimazole, stop it and call us, even if the reason is obvious, because of the risk of a rare side-effect. Please come back for a follow-up appointment in 3-4 months.

## 2019-01-13 NOTE — Progress Notes (Signed)
Subjective:    Patient ID: Jean Hopkins, female    DOB: 1955-09-17, 63 y.o.   MRN: [id number]  HPI Pt returns for f/u of hyperthyroidism (dx'ed early 2019, in eval of AF; [place] in 2020 showed multinodular thyroid, with no thyroid nodule meeting criteria for bx or f/u US; tapazole was chosen as initial rx, due to AF; it was stopped in [place], due to hypothyroidism; since then, she has been euthyroid off rx until early 2020, when it was resumed for recurrent hyperthyroidism).  No recent AF.  pt states she feels well in general.  Specifically, she denies palpitations.   Past Medical History:  Diagnosis Date  . AF (atrial fibrillation) ([place])   . Hyperthyroidism     No past surgical history on file.  Social History   Socioeconomic History  . Marital status: Married    Spouse name: Not on file  . Number of children: Not on file  . Years of education: Not on file  . Highest education level: Not on file  Occupational History  . Not on file  Social Needs  . Financial resource strain: Not on file  . Food insecurity    Worry: Not on file    Inability: Not on file  . Transportation needs    Medical: Not on file    Non-medical: Not on file  Tobacco Use  . Smoking status: Former Research scientist (life sciences)  . Smokeless tobacco: Never Used  Substance and Sexual Activity  . Alcohol use: Not on file  . Drug use: Yes    Types: "Crack" cocaine  . Sexual activity: Not on file  Lifestyle  . Physical activity    Days per week: Not on file    Minutes per session: Not on file  . Stress: Not on file  Relationships  . Social Herbalist on phone: Not on file    Gets together: Not on file    Attends religious service: Not on file    Active member of club or organization: Not on file    Attends meetings of clubs or organizations: Not on file    Relationship status: Not on file  . Intimate partner violence    Fear of current or ex partner: Not on file    Emotionally abused: Not on file    Physically  abused: Not on file    Forced sexual activity: Not on file  Other Topics Concern  . Not on file  Social History Narrative  . Not on file    Current Outpatient Medications on File Prior to Visit  Medication Sig Dispense Refill  . Cholecalciferol (VITAMIN D PO) Take 1 tablet by mouth daily.    . methimazole (TAPAZOLE) 5 MG tablet TAKE ONE TABLET BY MOUTH DAILY 90 tablet 1   No current facility-administered medications on file prior to visit.     No Known Allergies  Family History  Problem Relation Age of Onset  . Thyroid disease Neg Hx     BP 118/70 (BP Location: Left Arm, Patient Position: Sitting, Cuff Size: Large)   Pulse 80   Ht 5\' 4"  (1.626 m)   Wt 225 lb 3.2 oz (102.2 kg)   SpO2 97%   BMI 38.66 kg/m    Review of Systems Denies tremor, fever, and neck swelling.     Objective:   Physical Exam VITAL SIGNS:  See vs page GENERAL: no distress NECK: There is slight diffuse thyroid enlargement.  No thyroid nodule is palpable.  No palpable lymphadenopathy at the anterior neck.    Lab Results  Component Value Date   TSH 2.78 01/13/2019   T4TOTAL 11.6 07/29/2017      Assessment & Plan:  Hyperthyroidism: well-controlled.  AF: in this setting, even a mild TSH abnormality should be corrected.   Patient Instructions  Blood tests are requested for you today.  We'll let you know about the results.  If ever you have fever while taking methimazole, stop it and call us, even if the reason is obvious, because of the risk of a rare side-effect. Please come back for a follow-up appointment in 3-4 months.

## 2019-02-13 DIAGNOSIS — H2513 Age-related nuclear cataract, bilateral: Secondary | ICD-10-CM | POA: Diagnosis not present

## 2019-02-13 DIAGNOSIS — H5213 Myopia, bilateral: Secondary | ICD-10-CM | POA: Diagnosis not present

## 2019-04-21 ENCOUNTER — Telehealth: Payer: Self-pay

## 2019-04-21 ENCOUNTER — Other Ambulatory Visit: Payer: Self-pay

## 2019-04-21 ENCOUNTER — Other Ambulatory Visit (HOSPITAL_BASED_OUTPATIENT_CLINIC_OR_DEPARTMENT_OTHER): Payer: Self-pay | Admitting: Family Medicine

## 2019-04-21 DIAGNOSIS — Z1159 Encounter for screening for other viral diseases: Secondary | ICD-10-CM

## 2019-04-21 DIAGNOSIS — Z1231 Encounter for screening mammogram for malignant neoplasm of breast: Secondary | ICD-10-CM

## 2019-04-21 NOTE — Telephone Encounter (Signed)
Pt scheduled for previsit 11/12@8am , covid test 05/23/19@8am , colon scheduled in the [facility] 05/26/19@11 :30am. Pt aware of appts.

## 2019-04-21 NOTE — Telephone Encounter (Signed)
-----   Message from Jerene Bears, MD sent at 04/21/2019  2:28 PM EDT ----- This is Kathlee Nations Honeycutt's mother.  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 This is fine with me Please facilitate previsit and we can request records from [facility] when she comes to her previsit appt. The front had given her a bit of trouble getting a colon appt. Thanks [employer]

## 2019-04-24 ENCOUNTER — Other Ambulatory Visit: Payer: Self-pay

## 2019-04-28 ENCOUNTER — Encounter: Payer: Self-pay | Admitting: Endocrinology

## 2019-04-28 ENCOUNTER — Other Ambulatory Visit: Payer: Self-pay

## 2019-04-28 ENCOUNTER — Ambulatory Visit (INDEPENDENT_AMBULATORY_CARE_PROVIDER_SITE_OTHER): Payer: BC Managed Care – PPO | Admitting: Endocrinology

## 2019-04-28 VITALS — BP 128/70 | HR 82 | Ht 64.0 in | Wt 230.4 lb

## 2019-04-28 DIAGNOSIS — E0590 Thyrotoxicosis, unspecified without thyrotoxic crisis or storm: Secondary | ICD-10-CM

## 2019-04-28 LAB — TSH: TSH: 2.77 u[IU]/mL (ref 0.35–4.50)

## 2019-04-28 LAB — T4, FREE: Free T4: 1.02 ng/dL (ref 0.60–1.60)

## 2019-04-28 NOTE — Progress Notes (Signed)
Subjective:    Patient ID: Jean Hopkins, female    DOB: [birth year], 63 y.o.   MRN: [id number]  HPI Pt returns for f/u of hyperthyroidism (dx'ed early 2019, in eval of AF; [place] in 2020 showed multinodular thyroid, with no thyroid nodule meeting criteria for bx or f/u US; tapazole was chosen as initial rx, due to AF; it was stopped in [place], due to hypothyroidism; since then, she has been euthyroid off rx until early 2020, when it was resumed for recurrent hyperthyroidism).  No recent AF.  pt states she feels well in general.  Specifically, she denies palpitations and tremor Past Medical History:  Diagnosis Date  . AF (atrial fibrillation) ([place])   . Hyperthyroidism     No past surgical history on file.  Social History   Socioeconomic History  . Marital status: Married    Spouse name: Not on file  . Number of children: Not on file  . Years of education: Not on file  . Highest education level: Not on file  Occupational History  . Not on file  Social Needs  . Financial resource strain: Not on file  . Food insecurity    Worry: Not on file    Inability: Not on file  . Transportation needs    Medical: Not on file    Non-medical: Not on file  Tobacco Use  . Smoking status: Former Research scientist (life sciences)  . Smokeless tobacco: Never Used  Substance and Sexual Activity  . Alcohol use: Not on file  . Drug use: Yes    Types: "Crack" cocaine  . Sexual activity: Not on file  Lifestyle  . Physical activity    Days per week: Not on file    Minutes per session: Not on file  . Stress: Not on file  Relationships  . Social Herbalist on phone: Not on file    Gets together: Not on file    Attends religious service: Not on file    Active member of club or organization: Not on file    Attends meetings of clubs or organizations: Not on file    Relationship status: Not on file  . Intimate partner violence    Fear of current or ex partner: Not on file    Emotionally abused: Not on file   Physically abused: Not on file    Forced sexual activity: Not on file  Other Topics Concern  . Not on file  Social History Narrative  . Not on file    Current Outpatient Medications on File Prior to Visit  Medication Sig Dispense Refill  . Cholecalciferol (VITAMIN D PO) Take 1 tablet by mouth daily.    . methimazole (TAPAZOLE) 5 MG tablet TAKE ONE TABLET BY MOUTH DAILY 90 tablet 1   No current facility-administered medications on file prior to visit.     No Known Allergies  Family History  Problem Relation Age of Onset  . Thyroid disease Neg Hx     BP 128/70 (BP Location: Left Arm, Patient Position: Sitting, Cuff Size: Large)   Pulse 82   Ht 5\' 4"  (1.626 m)   Wt 230 lb 6.4 oz (104.5 kg)   SpO2 99%   BMI 39.55 kg/m    Review of Systems Denies fever.     Objective:   Physical Exam VITAL SIGNS:  See vs page GENERAL: no distress NECK: thyroid is slightly enlarged, with a 1 cm left nodule palpable.     Lab Results  Component Value Date   TSH 2.77 04/28/2019   T4TOTAL 11.6 07/29/2017       Assessment & Plan:  Hyperthyroidism: well-controlled.   MNG: clinically stable.   Patient Instructions  Blood tests are requested for you today.  We'll let you know about the results.  If ever you have fever while taking methimazole, stop it and call us, even if the reason is obvious, because of the risk of a rare side-effect. Please come back for a follow-up appointment in 4-6 months.

## 2019-04-28 NOTE — Patient Instructions (Addendum)
Blood tests are requested for you today.  We'll let you know about the results.  If ever you have fever while taking methimazole, stop it and call us, even if the reason is obvious, because of the risk of a rare side-effect. Please come back for a follow-up appointment in 4-6 months.

## 2019-05-08 ENCOUNTER — Other Ambulatory Visit: Payer: Self-pay

## 2019-05-08 ENCOUNTER — Ambulatory Visit (AMBULATORY_SURGERY_CENTER): Payer: Self-pay

## 2019-05-08 VITALS — Temp 96.9°F | Ht 64.0 in | Wt 232.0 lb

## 2019-05-08 DIAGNOSIS — Z1211 Encounter for screening for malignant neoplasm of colon: Secondary | ICD-10-CM

## 2019-05-08 MED ORDER — NA SULFATE-K SULFATE-MG SULF 17.5-3.13-1.6 GM/177ML PO SOLN: 1.0000 | Freq: Once | ORAL | 0 refills | Status: AC

## 2019-05-08 NOTE — Progress Notes (Signed)
Denies allergies to eggs or soy products. Denies complication of anesthesia or sedation. Denies use of weight loss medication. Denies use of O2.   Emmi instructions given for colonoscopy.  Covid screening is scheduled for 05/21/19 @ 8:20  Am.  A 15.00 coupon for Suprep was given to the patient.

## 2019-05-09 ENCOUNTER — Encounter (HOSPITAL_BASED_OUTPATIENT_CLINIC_OR_DEPARTMENT_OTHER): Payer: Self-pay

## 2019-05-09 ENCOUNTER — Ambulatory Visit (HOSPITAL_BASED_OUTPATIENT_CLINIC_OR_DEPARTMENT_OTHER)
Admission: RE | Admit: 2019-05-09 | Discharge: 2019-05-09 | Disposition: A | Discharge status: Home / Self Care | Payer: BC Managed Care – PPO | Source: Ambulatory Visit | Attending: Family Medicine | Admitting: Family Medicine

## 2019-05-09 DIAGNOSIS — Z1231 Encounter for screening mammogram for malignant neoplasm of breast: Secondary | ICD-10-CM | POA: Insufficient documentation

## 2019-05-12 ENCOUNTER — Encounter: Payer: Self-pay | Admitting: Internal Medicine

## 2019-05-20 ENCOUNTER — Other Ambulatory Visit: Payer: Self-pay | Admitting: Endocrinology

## 2019-05-21 ENCOUNTER — Other Ambulatory Visit: Payer: Self-pay | Admitting: Internal Medicine

## 2019-05-21 ENCOUNTER — Ambulatory Visit: Payer: BC Managed Care – PPO

## 2019-05-21 DIAGNOSIS — Z1159 Encounter for screening for other viral diseases: Secondary | ICD-10-CM

## 2019-05-23 LAB — SARS CORONAVIRUS 2 (TAT 6-24 HRS): SARS Coronavirus 2: NEGATIVE

## 2019-05-26 ENCOUNTER — Other Ambulatory Visit: Payer: Self-pay

## 2019-05-26 ENCOUNTER — Ambulatory Visit (AMBULATORY_SURGERY_CENTER): Payer: BC Managed Care – PPO | Admitting: Internal Medicine

## 2019-05-26 ENCOUNTER — Encounter: Payer: Self-pay | Admitting: Internal Medicine

## 2019-05-26 VITALS — BP 133/82 | HR 71 | Temp 97.9°F | Resp 18 | Ht 64.0 in | Wt 232.0 lb

## 2019-05-26 DIAGNOSIS — D125 Benign neoplasm of sigmoid colon: Secondary | ICD-10-CM | POA: Diagnosis not present

## 2019-05-26 DIAGNOSIS — D123 Benign neoplasm of transverse colon: Secondary | ICD-10-CM | POA: Diagnosis not present

## 2019-05-26 DIAGNOSIS — Z1211 Encounter for screening for malignant neoplasm of colon: Secondary | ICD-10-CM | POA: Diagnosis not present

## 2019-05-26 MED ORDER — SODIUM CHLORIDE 0.9 % IV SOLN: 500.0000 mL | Freq: Once | INTRAVENOUS | Status: DC

## 2019-05-26 NOTE — Progress Notes (Signed)
Called to room to assist during endoscopic procedure.  Patient ID and intended procedure confirmed with present staff. Received instructions for my participation in the procedure from the performing physician.  

## 2019-05-26 NOTE — Patient Instructions (Signed)
HANDOUTS PROVIDED ON: POLYPS, DIVERTICULOSIS, & HEMORRHOIDS   THE POLYPS TAKEN TODAY HAVE BEEN SENT FOR PATHOLOGY.  THE RESULTS CAN TAKE 2-3 WEEKS TO RECEIVE.  BASED ON THE RESULTS IS WHEN YOUR NEXT COLONOSCOPY WILL BE RECOMMENDED.  YOU MAY RESUME YOUR PREVIOUS DIET AND MEDICATION SCHEDULE.  [place] YOU FOR ALLOWING [place] TO CARE FOR YOU TODAY!!!  YOU HAD AN ENDOSCOPIC PROCEDURE TODAY AT [facility]:   Refer to the procedure report that was given to you for any specific questions about what was found during the examination.  If the procedure report does not answer your questions, please call your gastroenterologist to clarify.  If you requested that your care partner not be given the details of your procedure findings, then the procedure report has been included in a sealed envelope for you to review at your convenience later.  YOU SHOULD EXPECT: Some feelings of bloating in the abdomen. Passage of more gas than usual.  Walking can help get rid of the air that was put into your GI tract during the procedure and reduce the bloating. If you had a lower endoscopy (such as a colonoscopy or flexible sigmoidoscopy) you may notice spotting of blood in your stool or on the toilet paper. If you underwent a bowel prep for your procedure, you may not have a normal bowel movement for a few days.  Please Note:  You might notice some irritation and congestion in your nose or some drainage.  This is from the oxygen used during your procedure.  There is no need for concern and it should clear up in a day or so.  SYMPTOMS TO REPORT IMMEDIATELY:   Following lower endoscopy (colonoscopy or flexible sigmoidoscopy):  Excessive amounts of blood in the stool  Significant tenderness or worsening of abdominal pains  Swelling of the abdomen that is new, acute  Fever of 100F or higher  For urgent or emergent issues, a gastroenterologist can be reached at any hour by calling [phone].   DIET:  We  do recommend a small meal at first, but then you may proceed to your regular diet.  Drink plenty of fluids but you should avoid alcoholic beverages for 24 hours.  ACTIVITY:  You should plan to take it easy for the rest of today and you should NOT DRIVE or use heavy machinery until tomorrow (because of the sedation medicines used during the test).    FOLLOW UP: Our staff will call the number listed on your records 48-72 hours following your procedure to check on you and address any questions or concerns that you may have regarding the information given to you following your procedure. If we do not reach you, we will leave a message.  We will attempt to reach you two times.  During this call, we will ask if you have developed any symptoms of COVID 19. If you develop any symptoms (ie: fever, flu-like symptoms, shortness of breath, cough etc.) before then, please call [phone].  If you test positive for Covid 19 in the 2 weeks post procedure, please call and report this information to [place].    If any biopsies were taken you will be contacted by phone or by letter within the next 1-3 weeks.  Please call us at [phone] if you have not heard about the biopsies in 3 weeks.    SIGNATURES/CONFIDENTIALITY: You and/or your care partner have signed paperwork which will be entered into your electronic medical record.  These signatures attest to  the fact that that the information above on your After Visit Summary has been reviewed and is understood.  Full responsibility of the confidentiality of this discharge information lies with you and/or your care-partner.

## 2019-05-26 NOTE — Progress Notes (Signed)
Pt's states no medical or surgical changes since previsit or office visit. 

## 2019-05-26 NOTE — Op Note (Signed)
[facility] Patient Name: Jean Hopkins Procedure Date: 05/26/2019 11:02 AM MRN: [id number] Endoscopist: Jerene Bears , MD Age: 63 Referring MD:  Date of Birth: 01/21/1956 Gender: Female Account #: [id number] Procedure:                Colonoscopy Indications:              Screening for colorectal malignant neoplasm, Last                            colonoscopy 10 years ago Medicines:                Monitored Anesthesia Care Procedure:                Pre-Anesthesia Assessment:                           - Prior to the procedure, a History and Physical                            was performed, and patient medications and                            allergies were reviewed. The patient's tolerance of                            previous anesthesia was also reviewed. The risks                            and benefits of the procedure and the sedation                            options and risks were discussed with the patient.                            All questions were answered, and informed consent                            was obtained. Prior Anticoagulants: The patient has                            taken no previous anticoagulant or antiplatelet                            agents. ASA Grade Assessment: II - A patient with                            mild systemic disease. After reviewing the risks                            and benefits, the patient was deemed in                            satisfactory condition to undergo the procedure.  After obtaining informed consent, the colonoscope                            was passed under direct vision. Throughout the                            procedure, the patient's blood pressure, pulse, and                            oxygen saturations were monitored continuously. The                            Colonoscope was introduced through the anus and                            advanced to the cecum, identified by  appendiceal                            orifice and ileocecal valve. The colonoscopy was                            performed without difficulty. The patient tolerated                            the procedure well. The quality of the bowel                            preparation was excellent. The ileocecal valve,                            appendiceal orifice, and rectum were photographed. Scope In: 11:05:57 AM Scope Out: 11:19:06 AM Scope Withdrawal Time: 0 hours 10 minutes 52 seconds  Total Procedure Duration: 0 hours 13 minutes 9 seconds  Findings:                 The digital rectal exam was normal.                           A 5 mm polyp was found in the distal transverse                            colon. The polyp was sessile. The polyp was removed                            with a cold snare. Resection and retrieval were                            complete.                           A 4 mm polyp was found in the sigmoid colon. The                            polyp was sessile. The polyp was  removed with a                            cold snare. Resection and retrieval were complete.                           Multiple small and large-mouthed diverticula were                            found from sigmoid to ascending colon.                           Internal hemorrhoids were found during                            retroflexion. The hemorrhoids were moderate. Complications:            No immediate complications. Estimated Blood Loss:     Estimated blood loss was minimal. Impression:               - One 5 mm polyp in the distal transverse colon,                            removed with a cold snare. Resected and retrieved.                           - One 4 mm polyp in the sigmoid colon, removed with                            a cold snare. Resected and retrieved.                           - Moderate diverticulosis from sigmoid to ascending                            colon.                            - Internal hemorrhoids. Recommendation:           - Patient has a contact number available for                            emergencies. The signs and symptoms of potential                            delayed complications were discussed with the                            patient. Return to normal activities tomorrow.                            Written discharge instructions were provided to the                            patient.                           -  Resume previous diet.                           - Continue present medications.                           - Await pathology results.                           - Repeat colonoscopy is recommended for                            surveillance. The colonoscopy date will be                            determined after pathology results from today's                            exam become available for review. Jerene Bears, MD 05/26/2019 11:25:09 AM This report has been signed electronically.

## 2019-05-26 NOTE — Progress Notes (Signed)
Report to PACU, RN, vss, BBS= Clear.  

## 2019-05-28 ENCOUNTER — Telehealth: Payer: Self-pay

## 2019-05-28 NOTE — Telephone Encounter (Signed)
  Follow up Call-  Call back number 05/26/2019  Post procedure Call Back phone  # [phone]  Permission to leave phone message Yes  Some recent data might be hidden     Patient questions:  Do you have a fever, pain , or abdominal swelling? No. Pain Score  0 *  Have you tolerated food without any problems? Yes.    Have you been able to return to your normal activities? Yes.    Do you have any questions about your discharge instructions: Diet   No. Medications  No. Follow up visit  No.  Do you have questions or concerns about your Care? No.  Actions: * If pain score is 4 or above: No action needed, pain <4.  1. Have you developed a fever since your procedure? no  2.   Have you had an respiratory symptoms (SOB or cough) since your procedure? no  3.   Have you tested positive for COVID 19 since your procedure no  4.   Have you had any family members/close contacts diagnosed with the COVID 19 since your procedure?  no   If yes to any of these questions please route to Joylene John, RN and Alphonsa Gin, Therapist, sports.

## 2019-06-01 ENCOUNTER — Encounter: Payer: Self-pay | Admitting: Internal Medicine

## 2019-07-27 ENCOUNTER — Encounter: Payer: Self-pay | Admitting: Endocrinology

## 2019-08-21 ENCOUNTER — Other Ambulatory Visit: Payer: Self-pay | Admitting: Endocrinology

## 2019-08-27 DIAGNOSIS — D225 Melanocytic nevi of trunk: Secondary | ICD-10-CM | POA: Diagnosis not present

## 2019-08-27 DIAGNOSIS — Z85820 Personal history of malignant melanoma of skin: Secondary | ICD-10-CM | POA: Diagnosis not present

## 2019-08-27 DIAGNOSIS — L720 Epidermal cyst: Secondary | ICD-10-CM | POA: Diagnosis not present

## 2019-08-27 DIAGNOSIS — D2262 Melanocytic nevi of left upper limb, including shoulder: Secondary | ICD-10-CM | POA: Diagnosis not present

## 2019-09-30 ENCOUNTER — Other Ambulatory Visit: Payer: Self-pay

## 2019-10-02 ENCOUNTER — Ambulatory Visit (INDEPENDENT_AMBULATORY_CARE_PROVIDER_SITE_OTHER): Payer: BC Managed Care – PPO | Admitting: Endocrinology

## 2019-10-02 ENCOUNTER — Other Ambulatory Visit: Payer: Self-pay

## 2019-10-02 ENCOUNTER — Encounter: Payer: Self-pay | Admitting: Endocrinology

## 2019-10-02 VITALS — BP 140/80 | HR 89 | Ht 64.0 in | Wt 222.0 lb

## 2019-10-02 DIAGNOSIS — E0590 Thyrotoxicosis, unspecified without thyrotoxic crisis or storm: Secondary | ICD-10-CM

## 2019-10-02 LAB — TSH: TSH: 4.86 u[IU]/mL — ABNORMAL HIGH (ref 0.35–4.50)

## 2019-10-02 LAB — T4, FREE: Free T4: 1.07 ng/dL (ref 0.60–1.60)

## 2019-10-02 MED ORDER — METHIMAZOLE 5 MG PO TABS: 2.5000 mg | ORAL_TABLET | Freq: Every day | ORAL | 3 refills | Status: DC

## 2019-10-02 NOTE — Patient Instructions (Addendum)
Blood tests are requested for you today.  We'll let you know about the results.  If ever you have fever while taking methimazole, stop it and call us, even if the reason is obvious, because of the risk of a rare side-effect. It is best to never miss the medication.  However, if you do miss it, next best is to double up the next time.   Please come back for a follow-up appointment in 6 months.

## 2019-10-02 NOTE — Progress Notes (Signed)
Subjective:    Patient ID: Jean Hopkins, female    DOB: 1955-10-21, 64 y.o.   MRN: [id number]  HPI Pt returns for f/u of hyperthyroidism (dx'ed early 2019, in eval of AF; [place] in 2020 showed multinodular thyroid, with no thyroid nodule meeting criteria for bx or f/u US; tapazole was chosen as initial rx, due to AF; it was stopped in [place], due to hypothyroidism; since then, she has been euthyroid off rx until early 2020, when it was resumed for recurrent hyperthyroidism).  No recent AF.  pt states she feels well in general.  Specifically, she denies palpitations and tremor. Past Medical History:  Diagnosis Date  . AF (atrial fibrillation) ([place])   . Allergy   . Anemia   . Blood transfusion without reported diagnosis   . Cancer ([place])    Melanoma x2  . Cataract   . Hyperthyroidism     Past Surgical History:  Procedure Laterality Date  . ABDOMINAL HYSTERECTOMY    . CHOLECYSTECTOMY      Social History   Socioeconomic History  . Marital status: Married    Spouse name: Not on file  . Number of children: Not on file  . Years of education: Not on file  . Highest education level: Not on file  Occupational History  . Not on file  Tobacco Use  . Smoking status: Former Research scientist (life sciences)  . Smokeless tobacco: Never Used  Substance and Sexual Activity  . Alcohol use: Yes    Comment: occasional wine  . Drug use: Never  . Sexual activity: Not on file  Other Topics Concern  . Not on file  Social History Narrative  . Not on file   Social Determinants of Health   Financial Resource Strain:   . Difficulty of Paying Living Expenses:   Food Insecurity:   . Worried About Charity fundraiser in the Last Year:   . Arboriculturist in the Last Year:   Transportation Needs:   . Film/video editor (Medical):   Marland Kitchen Lack of Transportation (Non-Medical):   Physical Activity:   . Days of Exercise per Week:   . Minutes of Exercise per Session:   Stress:   . Feeling of Stress :   Social  Connections:   . Frequency of Communication with Friends and Family:   . Frequency of Social Gatherings with Friends and Family:   . Attends Religious Services:   . Active Member of Clubs or Organizations:   . Attends Archivist Meetings:   Marland Kitchen Marital Status:   Intimate Partner Violence:   . Fear of Current or Ex-Partner:   . Emotionally Abused:   Marland Kitchen Physically Abused:   . Sexually Abused:     Current Outpatient Medications on File Prior to Visit  Medication Sig Dispense Refill  . Cholecalciferol (VITAMIN D PO) Take 1 tablet by mouth daily.     No current facility-administered medications on file prior to visit.    No Known Allergies  Family History  Problem Relation Age of Onset  . Stomach cancer Paternal Grandmother   . Thyroid disease Neg Hx   . Colon cancer Neg Hx   . Esophageal cancer Neg Hx   . Rectal cancer Neg Hx     BP 140/80   Pulse 89   Ht 5\' 4"  (1.626 m)   Wt 222 lb (100.7 kg)   SpO2 98%   BMI 38.11 kg/m    Review of Systems Denies fever  Objective:   Physical Exam VITAL SIGNS:  See vs page GENERAL: no distress NECK: There is no palpable thyroid enlargement.  No thyroid nodule is palpable.  No palpable lymphadenopathy at the anterior neck.   Lab Results  Component Value Date   TSH 4.86 (H) 10/02/2019   T4TOTAL 11.6 07/29/2017      Assessment & Plan:  Hypothyroidism: she needs increased rx.  I have sent a prescription to your pharmacy, to reduce tapazole.

## 2019-10-24 DIAGNOSIS — Z0000 Encounter for general adult medical examination without abnormal findings: Secondary | ICD-10-CM | POA: Diagnosis not present

## 2019-10-24 DIAGNOSIS — E7800 Pure hypercholesterolemia, unspecified: Secondary | ICD-10-CM | POA: Diagnosis not present

## 2019-10-24 DIAGNOSIS — E119 Type 2 diabetes mellitus without complications: Secondary | ICD-10-CM | POA: Diagnosis not present

## 2019-10-24 DIAGNOSIS — E559 Vitamin D deficiency, unspecified: Secondary | ICD-10-CM | POA: Diagnosis not present

## 2019-11-13 ENCOUNTER — Other Ambulatory Visit: Payer: Self-pay

## 2019-11-17 ENCOUNTER — Ambulatory Visit (INDEPENDENT_AMBULATORY_CARE_PROVIDER_SITE_OTHER): Payer: BC Managed Care – PPO | Admitting: Endocrinology

## 2019-11-17 ENCOUNTER — Encounter: Payer: Self-pay | Admitting: Endocrinology

## 2019-11-17 ENCOUNTER — Other Ambulatory Visit: Payer: Self-pay

## 2019-11-17 VITALS — BP 140/80 | HR 83 | Ht 64.0 in | Wt 224.0 lb

## 2019-11-17 DIAGNOSIS — E0590 Thyrotoxicosis, unspecified without thyrotoxic crisis or storm: Secondary | ICD-10-CM

## 2019-11-17 LAB — TSH: TSH: 2.18 u[IU]/mL (ref 0.35–4.50)

## 2019-11-17 LAB — T4, FREE: Free T4: 1.14 ng/dL (ref 0.60–1.60)

## 2019-11-17 NOTE — Patient Instructions (Signed)
Blood tests are requested for you today.  We'll let you know about the results.   If ever you have fever while taking methimazole, stop it and call us, even if the reason is obvious, because of the risk of a rare side-effect.   It is best to never miss the medication.  However, if you do miss it, next best is to double up the next time.   Please come back for a follow-up appointment in 3-4 months.   

## 2019-11-17 NOTE — Progress Notes (Signed)
Subjective:    Patient ID: Jean Hopkins, female    DOB: 1955/08/23, 64 y.o.   MRN: [id number]  HPI Pt returns for f/u of hyperthyroidism (dx'ed early 2019, in eval of AF; [place] in 2020 showed multinodular thyroid, with no thyroid nodule meeting criteria for bx or f/u US; tapazole was chosen as initial rx, due to AF; it was stopped in [place], due to hypothyroidism; since then, she has been euthyroid off rx until early 2020, when it was resumed for recurrent hyperthyroidism).  No recent AF.  pt states she feels well in general.  Specifically, she denies palpitations and tremor. Past Medical History:  Diagnosis Date  . AF (atrial fibrillation) ([place])   . Allergy   . Anemia   . Blood transfusion without reported diagnosis   . Cancer ([place])    Melanoma x2  . Cataract   . Hyperthyroidism     Past Surgical History:  Procedure Laterality Date  . ABDOMINAL HYSTERECTOMY    . CHOLECYSTECTOMY      Social History   Socioeconomic History  . Marital status: Married    Spouse name: Not on file  . Number of children: Not on file  . Years of education: Not on file  . Highest education level: Not on file  Occupational History  . Not on file  Tobacco Use  . Smoking status: Former Research scientist (life sciences)  . Smokeless tobacco: Never Used  Substance and Sexual Activity  . Alcohol use: Yes    Comment: occasional wine  . Drug use: Never  . Sexual activity: Not on file  Other Topics Concern  . Not on file  Social History Narrative  . Not on file   Social Determinants of Health   Financial Resource Strain:   . Difficulty of Paying Living Expenses:   Food Insecurity:   . Worried About Charity fundraiser in the Last Year:   . Arboriculturist in the Last Year:   Transportation Needs:   . Film/video editor (Medical):   Marland Kitchen Lack of Transportation (Non-Medical):   Physical Activity:   . Days of Exercise per Week:   . Minutes of Exercise per Session:   Stress:   . Feeling of Stress :   Social  Connections:   . Frequency of Communication with Friends and Family:   . Frequency of Social Gatherings with Friends and Family:   . Attends Religious Services:   . Active Member of Clubs or Organizations:   . Attends Archivist Meetings:   Marland Kitchen Marital Status:   Intimate Partner Violence:   . Fear of Current or Ex-Partner:   . Emotionally Abused:   Marland Kitchen Physically Abused:   . Sexually Abused:     Current Outpatient Medications on File Prior to Visit  Medication Sig Dispense Refill  . Cholecalciferol (VITAMIN D PO) Take 1 tablet by mouth daily.    . methimazole (TAPAZOLE) 5 MG tablet Take 0.5 tablets (2.5 mg total) by mouth daily. 45 tablet 3   No current facility-administered medications on file prior to visit.    No Known Allergies  Family History  Problem Relation Age of Onset  . Stomach cancer Paternal Grandmother   . Thyroid disease Neg Hx   . Colon cancer Neg Hx   . Esophageal cancer Neg Hx   . Rectal cancer Neg Hx     BP 140/80   Pulse 83   Ht 5\' 4"  (1.626 m)   Wt 224 lb (101.6  kg)   SpO2 98%   BMI 38.45 kg/m    Review of Systems Denies fever.      Objective:   Physical Exam VITAL SIGNS:  See vs page.   GENERAL: no distress.   NECK: Thyroid is slightly and diffusely enlarged  No thyroid nodule is palpable.  No palpable lymphadenopathy at the anterior neck.    Lab Results  Component Value Date   TSH 2.18 11/17/2019   T4TOTAL 11.6 07/29/2017       Assessment & Plan:  Hyperthyroidism: well-controlled.  Please continue the same medication

## 2020-02-26 ENCOUNTER — Other Ambulatory Visit: Payer: Self-pay

## 2020-02-26 ENCOUNTER — Encounter: Payer: Self-pay | Admitting: Endocrinology

## 2020-02-26 ENCOUNTER — Ambulatory Visit (INDEPENDENT_AMBULATORY_CARE_PROVIDER_SITE_OTHER): Payer: BC Managed Care – PPO | Admitting: Endocrinology

## 2020-02-26 VITALS — BP 128/80 | HR 84 | Ht 64.0 in | Wt 229.0 lb

## 2020-02-26 DIAGNOSIS — E0590 Thyrotoxicosis, unspecified without thyrotoxic crisis or storm: Secondary | ICD-10-CM

## 2020-02-26 LAB — T4, FREE: Free T4: 1.04 ng/dL (ref 0.60–1.60)

## 2020-02-26 LAB — TSH: TSH: 1.79 u[IU]/mL (ref 0.35–4.50)

## 2020-02-26 NOTE — Progress Notes (Signed)
Subjective:    Patient ID: Jean Hopkins, female    DOB: 1955-10-10, 64 y.o.   MRN: [id number]  HPI Pt returns for f/u of hyperthyroidism (dx'ed early 2019, in eval of AF; [place] in 2020 showed multinodular thyroid, with no thyroid nodule meeting criteria for bx or f/u US; tapazole was chosen as initial rx, due to AF; it was stopped in [place], due to hypothyroidism; since then, she has been euthyroid off rx until early 2020, when it was resumed for recurrent hyperthyroidism).  No recent AF.  pt states she feels well in general.  Specifically, she denies palpitations and tremor.   Past Medical History:  Diagnosis Date  . AF (atrial fibrillation) ([place])   . Allergy   . Anemia   . Blood transfusion without reported diagnosis   . Cancer ([place])    Melanoma x2  . Cataract   . Hyperthyroidism     Past Surgical History:  Procedure Laterality Date  . ABDOMINAL HYSTERECTOMY    . CHOLECYSTECTOMY      Social History   Socioeconomic History  . Marital status: Married    Spouse name: Not on file  . Number of children: Not on file  . Years of education: Not on file  . Highest education level: Not on file  Occupational History  . Not on file  Tobacco Use  . Smoking status: Former Research scientist (life sciences)  . Smokeless tobacco: Never Used  Substance and Sexual Activity  . Alcohol use: Yes    Comment: occasional wine  . Drug use: Never  . Sexual activity: Not on file  Other Topics Concern  . Not on file  Social History Narrative  . Not on file   Social Determinants of Health   Financial Resource Strain:   . Difficulty of Paying Living Expenses: Not on file  Food Insecurity:   . Worried About Charity fundraiser in the Last Year: Not on file  . Ran Out of Food in the Last Year: Not on file  Transportation Needs:   . Lack of Transportation (Medical): Not on file  . Lack of Transportation (Non-Medical): Not on file  Physical Activity:   . Days of Exercise per Week: Not on file  . Minutes of Exercise  per Session: Not on file  Stress:   . Feeling of Stress : Not on file  Social Connections:   . Frequency of Communication with Friends and Family: Not on file  . Frequency of Social Gatherings with Friends and Family: Not on file  . Attends Religious Services: Not on file  . Active Member of Clubs or Organizations: Not on file  . Attends Archivist Meetings: Not on file  . Marital Status: Not on file  Intimate Partner Violence:   . Fear of Current or Ex-Partner: Not on file  . Emotionally Abused: Not on file  . Physically Abused: Not on file  . Sexually Abused: Not on file    Current Outpatient Medications on File Prior to Visit  Medication Sig Dispense Refill  . Cholecalciferol (VITAMIN D PO) Take 1 tablet by mouth daily.    . methimazole (TAPAZOLE) 5 MG tablet Take 0.5 tablets (2.5 mg total) by mouth daily. 45 tablet 3   No current facility-administered medications on file prior to visit.    No Known Allergies  Family History  Problem Relation Age of Onset  . Stomach cancer Paternal Grandmother   . Thyroid disease Neg Hx   . Colon cancer Neg  Hx   . Esophageal cancer Neg Hx   . Rectal cancer Neg Hx     BP 128/80   Pulse 84   Ht 5\' 4"  (1.626 m)   Wt 229 lb (103.9 kg)   SpO2 98%   BMI 39.31 kg/m    Review of Systems Denies fever    Objective:   Physical Exam VITAL SIGNS:  See vs page GENERAL: no distress NECK: thyroid is slightly and diffusely enlarged.  No thyroid nodule is palpable.  No palpable lymphadenopathy at the anterior neck.   Lab Results  Component Value Date   TSH 1.79 02/26/2020   T4TOTAL 11.6 07/29/2017       Assessment & Plan:  Hyperthyroidism: well-controlled.  Please continue the same medication

## 2020-02-26 NOTE — Patient Instructions (Addendum)
Blood tests are requested for you today.  We'll let you know about the results.  If ever you have fever while taking methimazole, stop it and call us, even if the reason is obvious, because of the risk of a rare side-effect. It is best to never miss the medication.  However, if you do miss it, next best is to double up the next time.   Please come back for a follow-up appointment in January.

## 2020-04-02 ENCOUNTER — Other Ambulatory Visit (HOSPITAL_BASED_OUTPATIENT_CLINIC_OR_DEPARTMENT_OTHER): Payer: Self-pay | Admitting: Family Medicine

## 2020-04-02 DIAGNOSIS — Z1231 Encounter for screening mammogram for malignant neoplasm of breast: Secondary | ICD-10-CM

## 2020-04-06 ENCOUNTER — Ambulatory Visit: Payer: BC Managed Care – PPO | Admitting: Endocrinology

## 2020-05-10 ENCOUNTER — Other Ambulatory Visit: Payer: Self-pay

## 2020-05-10 ENCOUNTER — Ambulatory Visit (HOSPITAL_BASED_OUTPATIENT_CLINIC_OR_DEPARTMENT_OTHER)
Admission: RE | Admit: 2020-05-10 | Discharge: 2020-05-10 | Disposition: A | Discharge status: Home / Self Care | Payer: BC Managed Care – PPO | Source: Ambulatory Visit | Attending: Family Medicine | Admitting: Family Medicine

## 2020-05-10 ENCOUNTER — Encounter (HOSPITAL_BASED_OUTPATIENT_CLINIC_OR_DEPARTMENT_OTHER): Payer: Self-pay

## 2020-05-10 DIAGNOSIS — Z1231 Encounter for screening mammogram for malignant neoplasm of breast: Secondary | ICD-10-CM | POA: Diagnosis not present

## 2020-05-12 IMAGING — MG DIGITAL SCREENING BILATERAL MAMMOGRAM WITH TOMO AND CAD
6 of 10 series · 6 of 30 positions shown · non-contrast
Comparison: Previous exam(s).

ACR Breast Density Category a: The breast tissue is almost entirely
fatty.

CLINICAL DATA: Screening.

EXAM:
DIGITAL SCREENING BILATERAL MAMMOGRAM WITH TOMO AND CAD

[R CC synth-2D]
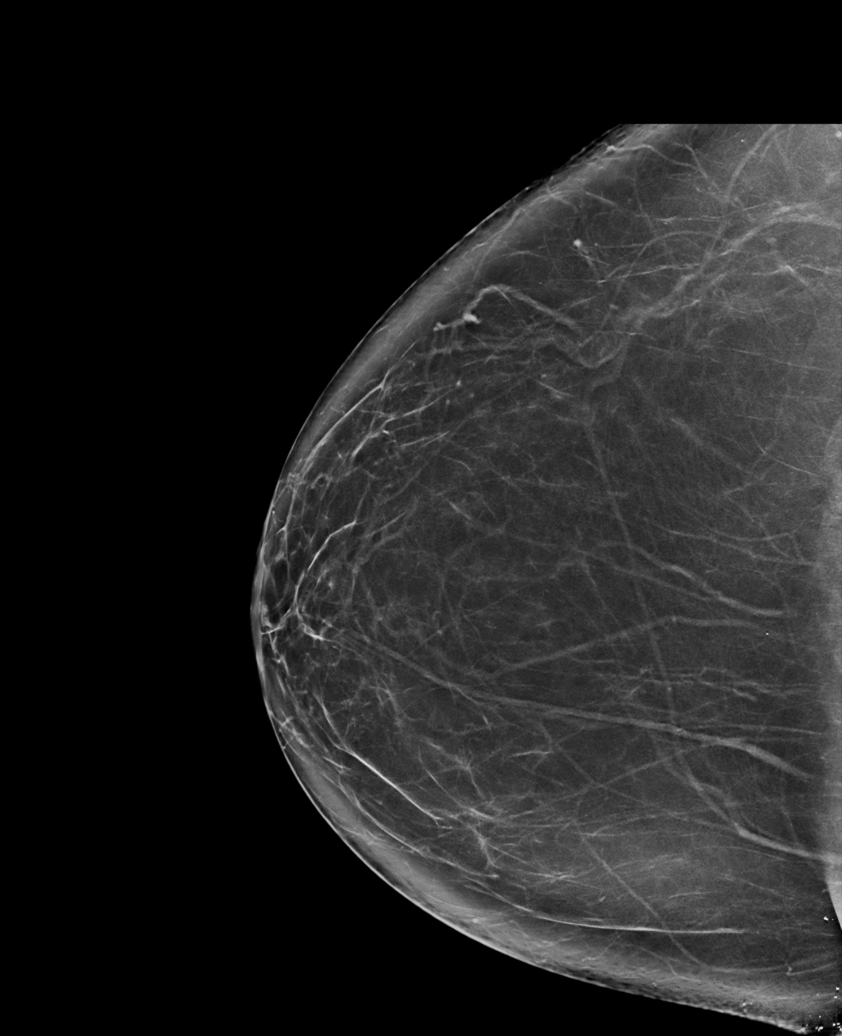

[R CV synth-2D]
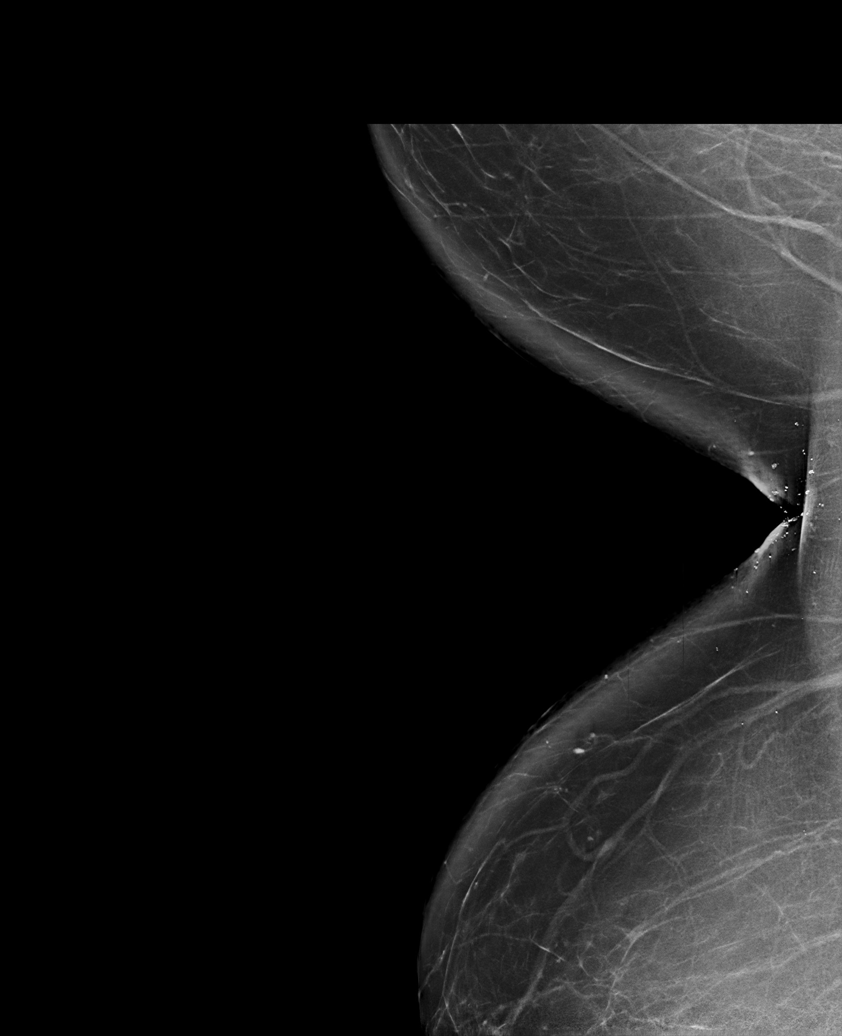

[R MLO synth-2D]
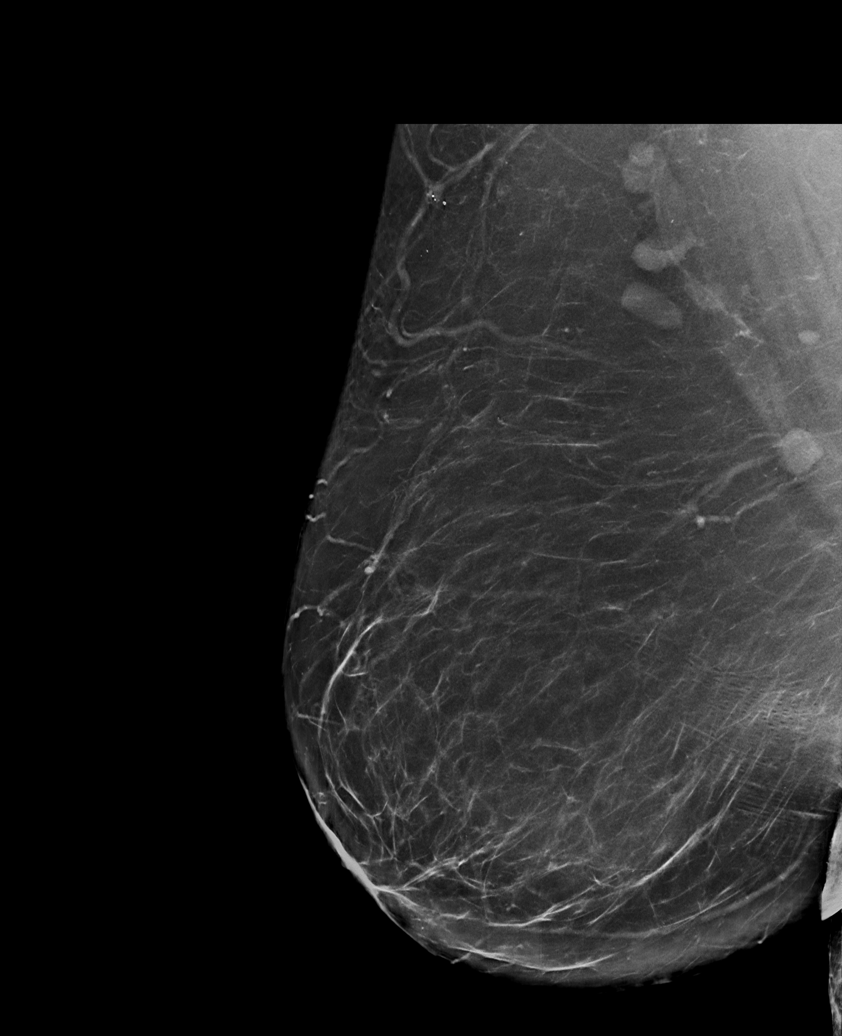

[L CC synth-2D]
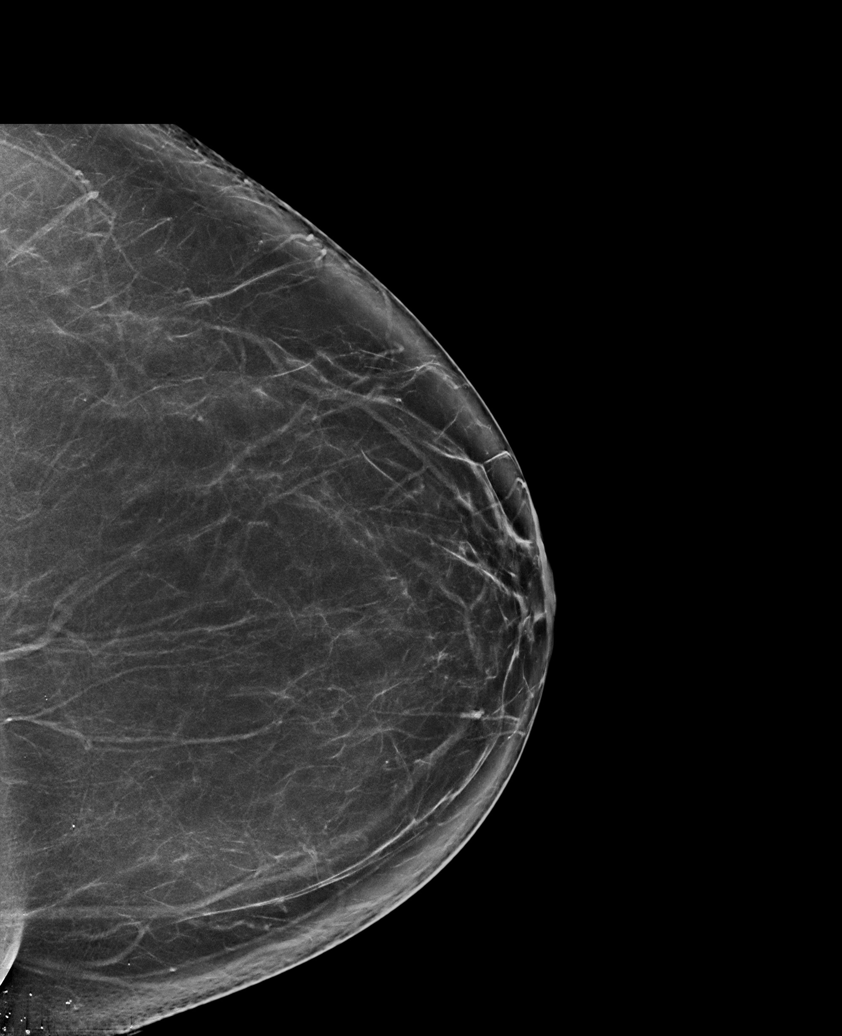

[L MLO synth-2D]
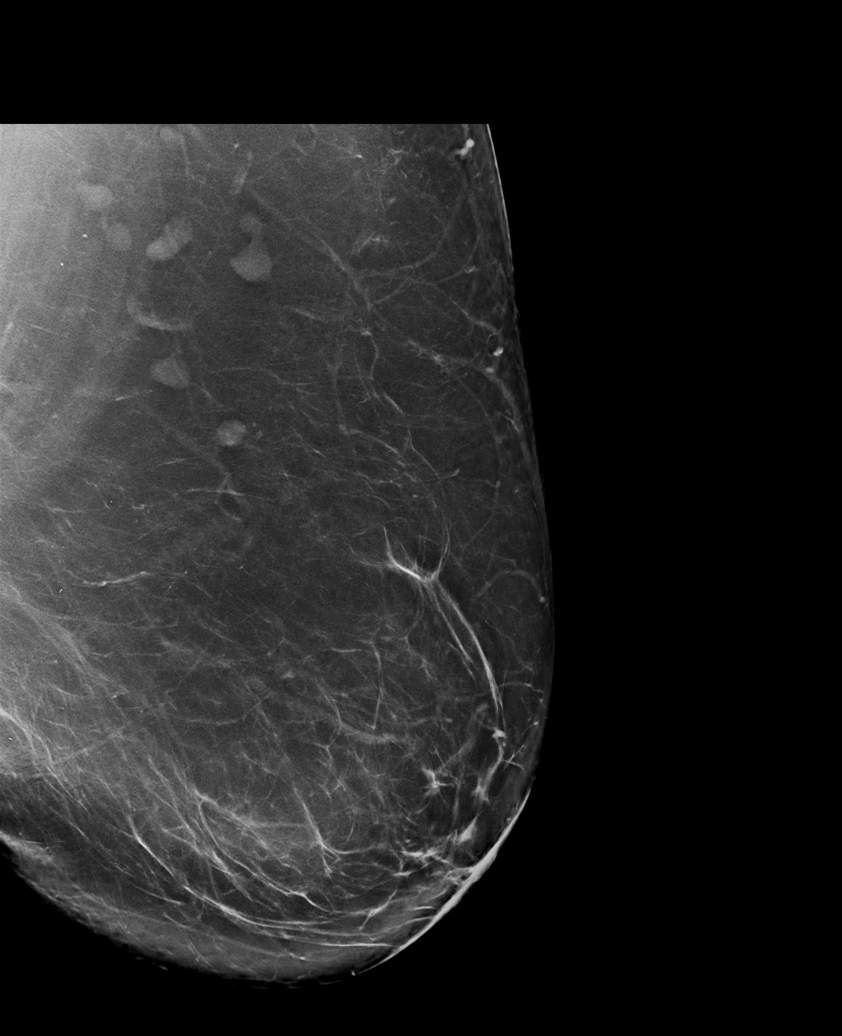

[L CC tomo · tomo slice 45/88.0]
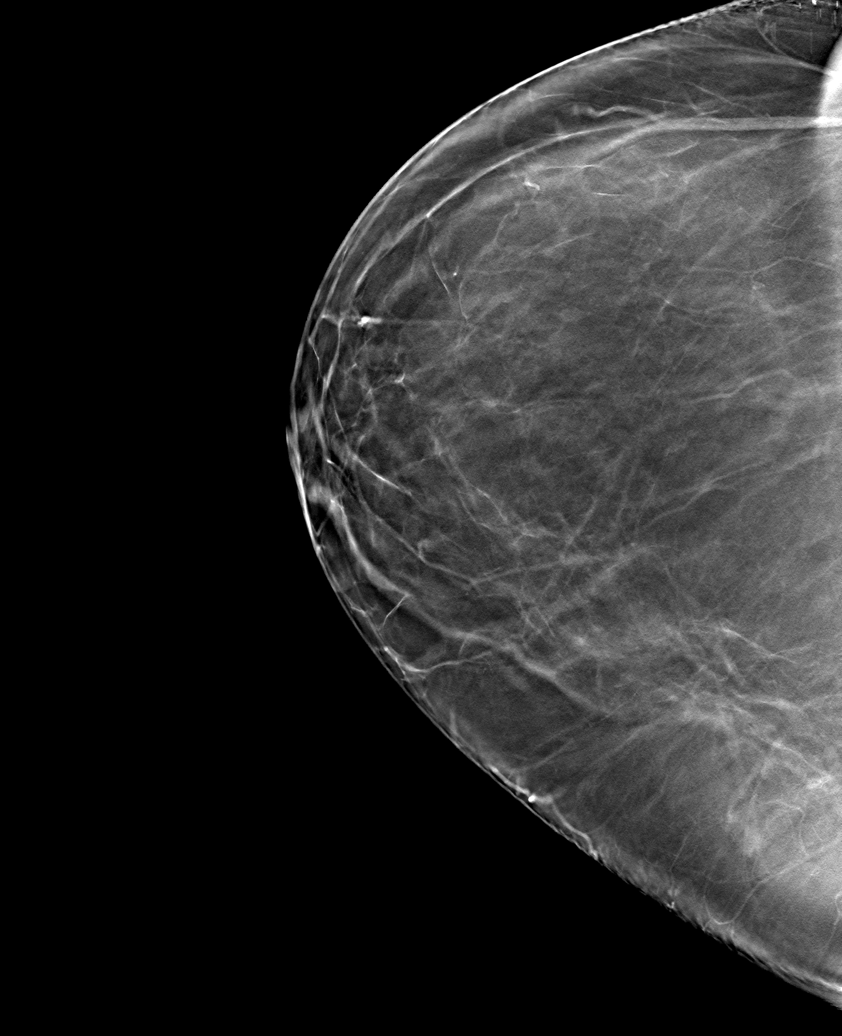

[6 of 30 positions shown; findings below may reference images not displayed]

FINDINGS: There are no findings suspicious for malignancy. Images were
processed with CAD.
IMPRESSION: No mammographic evidence of malignancy. A result letter of this
screening mammogram will be mailed directly to the patient.

RECOMMENDATION:
Screening mammogram in one year. (Code:[id number]-[id number])

BI-RADS CATEGORY  1: Negative.

## 2020-06-28 DIAGNOSIS — D225 Melanocytic nevi of trunk: Secondary | ICD-10-CM | POA: Diagnosis not present

## 2020-06-28 DIAGNOSIS — L821 Other seborrheic keratosis: Secondary | ICD-10-CM | POA: Diagnosis not present

## 2020-06-28 DIAGNOSIS — L245 Irritant contact dermatitis due to other chemical products: Secondary | ICD-10-CM | POA: Diagnosis not present

## 2020-06-28 DIAGNOSIS — Z85820 Personal history of malignant melanoma of skin: Secondary | ICD-10-CM | POA: Diagnosis not present

## 2020-07-06 ENCOUNTER — Encounter: Payer: Self-pay | Admitting: Endocrinology

## 2020-07-06 ENCOUNTER — Other Ambulatory Visit: Payer: Self-pay

## 2020-07-06 ENCOUNTER — Ambulatory Visit (INDEPENDENT_AMBULATORY_CARE_PROVIDER_SITE_OTHER): Payer: BC Managed Care – PPO | Admitting: Endocrinology

## 2020-07-06 VITALS — BP 120/78 | HR 82 | Ht 64.0 in | Wt 235.4 lb

## 2020-07-06 DIAGNOSIS — E0590 Thyrotoxicosis, unspecified without thyrotoxic crisis or storm: Secondary | ICD-10-CM | POA: Diagnosis not present

## 2020-07-06 LAB — TSH: TSH: 1.66 u[IU]/mL (ref 0.35–4.50)

## 2020-07-06 LAB — T4, FREE: Free T4: 1.13 ng/dL (ref 0.60–1.60)

## 2020-07-06 NOTE — Progress Notes (Signed)
Subjective:    Patient ID: Jean Hopkins, female    DOB: 06/09/1956, 65 y.o.   MRN: [id number]  HPI Pt returns for f/u of hyperthyroidism (dx'ed early 2019, in eval of AF; [place] in 2020 showed multinodular thyroid, with no thyroid nodule meeting criteria for bx or f/u US; tapazole was chosen as initial rx, due to AF; it was stopped in [place], due to hypothyroidism; since then, she has been euthyroid off rx until early 2020, when it was resumed for recurrent hyperthyroidism; she declines RAI).  No recent AF.  pt states she feels well in general.  Specifically, she denies palpitations and tremor.   Past Medical History:  Diagnosis Date  . AF (atrial fibrillation) ([place])   . Allergy   . Anemia   . Blood transfusion without reported diagnosis   . Cancer ([place])    Melanoma x2  . Cataract   . Hyperthyroidism     Past Surgical History:  Procedure Laterality Date  . ABDOMINAL HYSTERECTOMY    . CHOLECYSTECTOMY      Social History   Socioeconomic History  . Marital status: Married    Spouse name: Not on file  . Number of children: Not on file  . Years of education: Not on file  . Highest education level: Not on file  Occupational History  . Not on file  Tobacco Use  . Smoking status: Former Research scientist (life sciences)  . Smokeless tobacco: Never Used  Substance and Sexual Activity  . Alcohol use: Yes    Comment: occasional wine  . Drug use: Never  . Sexual activity: Not on file  Other Topics Concern  . Not on file  Social History Narrative  . Not on file   Social Determinants of Health   Financial Resource Strain: Not on file  Food Insecurity: Not on file  Transportation Needs: Not on file  Physical Activity: Not on file  Stress: Not on file  Social Connections: Not on file  Intimate Partner Violence: Not on file    Current Outpatient Medications on File Prior to Visit  Medication Sig Dispense Refill  . Cholecalciferol (VITAMIN D PO) Take 1 tablet by mouth daily.    . methimazole  (TAPAZOLE) 5 MG tablet Take 0.5 tablets (2.5 mg total) by mouth daily. 45 tablet 3   No current facility-administered medications on file prior to visit.    No Known Allergies  Family History  Problem Relation Age of Onset  . Stomach cancer Paternal Grandmother   . Thyroid disease Neg Hx   . Colon cancer Neg Hx   . Esophageal cancer Neg Hx   . Rectal cancer Neg Hx     BP 120/78   Pulse 82   Ht 5\' 4"  (1.626 m)   Wt 235 lb 6.4 oz (106.8 kg)   SpO2 98%   BMI 40.41 kg/m    Review of Systems Denies fever.      Objective:   Physical Exam VITAL SIGNS:  See vs page GENERAL: no distress NECK: thyroid is slightly and diffusely enlarged.  No thyroid nodule is palpable.  No palpable lymphadenopathy at the anterior neck.   Lab Results  Component Value Date   TSH 1.66 07/06/2020   T4TOTAL 11.6 07/29/2017       Assessment & Plan:  Hyperthyroidism: well-controlled.  Please continue the same tapazole.    Patient Instructions  Blood tests are requested for you today.  We'll let you know about the results.  If ever you have  fever while taking methimazole, stop it and call us, even if the reason is obvious, because of the risk of a rare side-effect.   It is best to never miss the medication.  However, if you do miss it, next best is to double up the next time.   Please come back for a follow-up appointment in 6 months.

## 2020-07-06 NOTE — Patient Instructions (Addendum)
Blood tests are requested for you today.  We'll let you know about the results.  If ever you have fever while taking methimazole, stop it and call us, even if the reason is obvious, because of the risk of a rare side-effect.   It is best to never miss the medication.  However, if you do miss it, next best is to double up the next time.   Please come back for a follow-up appointment in 6 months.

## 2020-07-16 DIAGNOSIS — H2513 Age-related nuclear cataract, bilateral: Secondary | ICD-10-CM | POA: Diagnosis not present

## 2020-07-16 DIAGNOSIS — H25043 Posterior subcapsular polar age-related cataract, bilateral: Secondary | ICD-10-CM | POA: Diagnosis not present

## 2020-07-16 DIAGNOSIS — H5213 Myopia, bilateral: Secondary | ICD-10-CM | POA: Diagnosis not present

## 2020-10-03 ENCOUNTER — Other Ambulatory Visit: Payer: Self-pay | Admitting: Endocrinology

## 2020-10-20 DIAGNOSIS — Z713 Dietary counseling and surveillance: Secondary | ICD-10-CM | POA: Diagnosis not present

## 2020-12-04 ENCOUNTER — Emergency Department (INDEPENDENT_AMBULATORY_CARE_PROVIDER_SITE_OTHER)
Admission: EM | Admit: 2020-12-04 | Discharge: 2020-12-04 | Disposition: A | Discharge status: Home / Self Care | Payer: BC Managed Care – PPO | Source: Home / Self Care

## 2020-12-04 ENCOUNTER — Other Ambulatory Visit: Payer: Self-pay

## 2020-12-04 ENCOUNTER — Encounter: Payer: Self-pay | Admitting: Emergency Medicine

## 2020-12-04 DIAGNOSIS — J3489 Other specified disorders of nose and nasal sinuses: Secondary | ICD-10-CM | POA: Diagnosis not present

## 2020-12-04 DIAGNOSIS — J309 Allergic rhinitis, unspecified: Secondary | ICD-10-CM

## 2020-12-04 DIAGNOSIS — J0100 Acute maxillary sinusitis, unspecified: Secondary | ICD-10-CM

## 2020-12-04 DIAGNOSIS — R059 Cough, unspecified: Secondary | ICD-10-CM

## 2020-12-04 LAB — POC SARS CORONAVIRUS 2 AG -  ED: SARS Coronavirus 2 Ag: NEGATIVE

## 2020-12-04 MED ORDER — FEXOFENADINE HCL 180 MG PO TABS: 180.0000 mg | ORAL_TABLET | Freq: Every day | ORAL | 0 refills | Status: DC

## 2020-12-04 MED ORDER — AMOXICILLIN-POT CLAVULANATE 875-125 MG PO TABS: 1.0000 | ORAL_TABLET | Freq: Two times a day (BID) | ORAL | 0 refills | Status: DC

## 2020-12-04 MED ORDER — PREDNISONE 20 MG PO TABS: ORAL_TABLET | ORAL | 0 refills | Status: DC

## 2020-12-04 MED ORDER — ACETAMINOPHEN 325 MG PO TABS: 650.0000 mg | ORAL_TABLET | ORAL | Status: DC

## 2020-12-04 MED ORDER — METHYLPREDNISOLONE SODIUM SUCC 125 MG IJ SOLR
125.0000 mg | Freq: Once | INTRAMUSCULAR | Status: AC
  Administered 2020-12-04: 125 mg via INTRAMUSCULAR

## 2020-12-04 NOTE — Discharge Instructions (Addendum)
Advised/instructed patient take medication as directed with food to completion.  Instructed patient to take Allegra 180 mg daily for the next 5 days, then as needed.  Instructed patient not to start oral prednisone burst until tomorrow morning, Sunday, 12/05/2020.  Encourage patient increase daily water intake while taking this medication.

## 2020-12-04 NOTE — ED Provider Notes (Signed)
[name]    CSN: [id number] Arrival date & time: 12/04/20  1117      History   Chief Complaint Chief Complaint  Patient presents with   Nasal Congestion    HPI Jean Hopkins is a 65 y.o. female.   HPI patient reports headache, sinus nasal congestion, productive cough, fever, swollen lymph nodes for 4 to 5 days.  Patient reports is currently vaccinated for COVID-19.  Past Medical History:  Diagnosis Date   AF (atrial fibrillation) ([facility])    Allergy    Anemia    Blood transfusion without reported diagnosis    Cancer ([place])    Melanoma x2   Cataract    Hyperthyroidism     Patient Active Problem List   Diagnosis Date Noted   Hyperthyroidism    AF (atrial fibrillation) ([place])     Past Surgical History:  Procedure Laterality Date   ABDOMINAL HYSTERECTOMY     CHOLECYSTECTOMY      OB History   No obstetric history on file.      Home Medications    Prior to Admission medications   Medication Sig Start Date End Date Taking? Authorizing Provider  amoxicillin-clavulanate (AUGMENTIN) 875-125 MG tablet Take 1 tablet by mouth every 12 (twelve) hours. 12/04/20  Yes Eliezer Lofts, FNP  Cholecalciferol (VITAMIN D PO) Take 1 tablet by mouth daily.   Yes [provider]  fexofenadine (ALLEGRA ALLERGY) 180 MG tablet Take 1 tablet (180 mg total) by mouth daily for 15 days. 12/04/20 12/19/20 Yes Eliezer Lofts, FNP  methimazole (TAPAZOLE) 5 MG tablet TAKE 1/2 TABLET BY MOUTH DAILY 10/04/20  Yes Renato Shin, MD  OMEPRAZOLE PO Take by mouth.   Yes [provider]  predniSONE (DELTASONE) 20 MG tablet Take 3 tabs PO daily x 5 days. 12/04/20  Yes Eliezer Lofts, FNP    Family History Family History  Problem Relation Age of Onset   Stomach cancer Paternal Grandmother    Thyroid disease Neg Hx    Colon cancer Neg Hx    Esophageal cancer Neg Hx    Rectal cancer Neg Hx     Social History Social History   Tobacco Use   Smoking status: Former     Pack years: 0.00   Smokeless tobacco: Never  Substance Use Topics   Alcohol use: Yes    Comment: occasional wine   Drug use: Never     Allergies   Patient has no known allergies.   Review of Systems Review of Systems  Constitutional:  Positive for fatigue.  HENT:  Positive for congestion, postnasal drip and sore throat.   Eyes: Negative.   Respiratory:  Positive for cough.   Cardiovascular: Negative.   Gastrointestinal: Negative.   Genitourinary: Negative.   Musculoskeletal:  Positive for myalgias.  Skin: Negative.   Neurological: Negative.     Physical Exam Triage Vital Signs ED Triage Vitals [12/04/20 1151]  Enc Vitals Group     BP (!) 173/91     Pulse Rate (!) 103     Resp      Temp 99.9 F (37.7 C)     Temp Source Oral     SpO2 94 %     Weight      Height      Head Circumference      Peak Flow      Pain Score 5     Pain Loc      Pain Edu?      Excl.  in [place]?    No data found.  Updated Vital Signs BP (!) 173/91 (BP Location: Right Arm)   Pulse (!) 103   Temp 99.9 F (37.7 C) (Oral)   SpO2 94%   Physical Exam Vitals and nursing note reviewed.  Constitutional:      General: She is not in acute distress.    Appearance: Normal appearance. She is obese. She is ill-appearing.  HENT:     Head: Normocephalic and atraumatic.     Right Ear: Tympanic membrane and ear canal normal.     Left Ear: Tympanic membrane and ear canal normal.     Ears:     Comments: Eustachian tube dysfunction noted bilaterally    Nose: Congestion present. No rhinorrhea.     Right Turbinates: Enlarged.     Left Turbinates: Enlarged.     Right Sinus: Maxillary sinus tenderness present.     Left Sinus: Maxillary sinus tenderness present.     Comments: Turbinates are erythematous with scant clear rhinorrhea    Mouth/Throat:     Lips: Pink.     Pharynx: Oropharynx is clear. Uvula midline. No pharyngeal swelling, oropharyngeal exudate, posterior oropharyngeal erythema or uvula  swelling.     Comments: Moderate amount of clear drainage of posterior oropharynx noted Cardiovascular:     Rate and Rhythm: Normal rate and regular rhythm.     Pulses: Normal pulses.     Heart sounds: Normal heart sounds.  Pulmonary:     Effort: Pulmonary effort is normal. No respiratory distress.     Breath sounds: Normal breath sounds. No wheezing, rhonchi or rales.     Comments: No adventitious breath sounds noted, decreased air entry at bases bilaterally Musculoskeletal:        General: Normal range of motion.     Cervical back: Normal range of motion and neck supple. Tenderness present.  Lymphadenopathy:     Cervical: Cervical adenopathy present.  Skin:    General: Skin is warm and dry.  Neurological:     General: No focal deficit present.     Mental Status: She is alert and oriented to person, place, and time.  Psychiatric:        Mood and Affect: Mood normal.        Behavior: Behavior normal.     UC Treatments / Results  Labs (all labs ordered are listed, but only abnormal results are displayed) Labs Reviewed  POC SARS CORONAVIRUS 2 AG -  ED    EKG   Radiology No results found.  Procedures Procedures (including critical care time)  Medications Ordered in UC Medications  acetaminophen (TYLENOL) tablet 650 mg (has no administration in time range)  methylPREDNISolone sodium succinate (SOLU-MEDROL) 125 mg/2 mL injection 125 mg (has no administration in time range)    Initial Impression / Assessment and Plan / UC Course  I have reviewed the triage vital signs and the nursing notes.  Pertinent labs & imaging results that were available during my care of the patient were reviewed by me and considered in my medical decision making (see chart for details).     MDM: 1.  Acute maxillary sinusitis, 2.  Sinus pressure, 3.  Allergic rhinitis, 4.  Cough.  Patient discharged home, hemodynamically stable. Final Clinical Impressions(s) / UC Diagnoses   Final diagnoses:   Allergic rhinitis, unspecified seasonality, unspecified trigger  Acute maxillary sinusitis, recurrence not specified  Sinus pressure  Cough     Discharge Instructions      Advised/instructed  patient take medication as directed with food to completion.  Instructed patient to take Allegra 180 mg daily for the next 5 days, then as needed.  Instructed patient not to start oral prednisone burst until tomorrow morning, Sunday, 12/05/2020.  Encourage patient increase daily water intake while taking this medication.     ED Prescriptions     Medication Sig Dispense Auth. Provider   amoxicillin-clavulanate (AUGMENTIN) 875-125 MG tablet Take 1 tablet by mouth every 12 (twelve) hours. 14 tablet Eliezer Lofts, FNP   predniSONE (DELTASONE) 20 MG tablet Take 3 tabs PO daily x 5 days. 15 tablet Eliezer Lofts, FNP   fexofenadine [facility] ALLERGY) 180 MG tablet Take 1 tablet (180 mg total) by mouth daily for 15 days. 15 tablet Eliezer Lofts, FNP      PDMP not reviewed this encounter.   Eliezer Lofts, [place] 12/04/20 1307

## 2020-12-04 NOTE — ED Triage Notes (Signed)
Patient states that she's been sick since Tuesday, head and nasal congestion.  Productive cough, fever, swollen lymph nodes.  Patient has taken several COVID tests, all negative.  Patient has taken Nyquil and Dayquil.  Patient is vaccinated for COVID.

## 2020-12-17 ENCOUNTER — Emergency Department (INDEPENDENT_AMBULATORY_CARE_PROVIDER_SITE_OTHER): Payer: BC Managed Care – PPO

## 2020-12-17 ENCOUNTER — Emergency Department (INDEPENDENT_AMBULATORY_CARE_PROVIDER_SITE_OTHER)
Admission: RE | Admit: 2020-12-17 | Discharge: 2020-12-17 | Disposition: A | Discharge status: Home / Self Care | Payer: BC Managed Care – PPO | Source: Ambulatory Visit

## 2020-12-17 ENCOUNTER — Other Ambulatory Visit: Payer: Self-pay

## 2020-12-17 VITALS — BP 158/99 | HR 86 | Temp 97.8°F | Resp 18

## 2020-12-17 DIAGNOSIS — N898 Other specified noninflammatory disorders of vagina: Secondary | ICD-10-CM

## 2020-12-17 DIAGNOSIS — M25561 Pain in right knee: Secondary | ICD-10-CM | POA: Diagnosis not present

## 2020-12-17 DIAGNOSIS — M1711 Unilateral primary osteoarthritis, right knee: Secondary | ICD-10-CM

## 2020-12-17 MED ORDER — FLUCONAZOLE 150 MG PO TABS: 150.0000 mg | ORAL_TABLET | Freq: Every day | ORAL | 0 refills | Status: AC

## 2020-12-17 MED ORDER — CELECOXIB 100 MG PO CAPS: 100.0000 mg | ORAL_CAPSULE | Freq: Two times a day (BID) | ORAL | 0 refills | Status: AC

## 2020-12-17 NOTE — ED Triage Notes (Signed)
Pt c/o RT knee pain x 3 weeks. Was seen here for sinus issues, given steroids which helped the knee, but now pain is worse. Pain is worst when moving from sitting to standing. Pain will radiate down lower leg sometimes. No known injury.  Pain 4/10 Motrin prn.

## 2020-12-17 NOTE — Discharge Instructions (Addendum)
Advised patient take medication as directed with food to completion.  Encourage patient increase daily water intake while taking this medication.  If symptoms are unresolved and or worsening please follow-up with PCP or orthopedics provided above.

## 2020-12-17 NOTE — ED Provider Notes (Signed)
[facility]    CSN: [id number] Arrival date & time: 12/17/20  0998      History   Chief Complaint Chief Complaint  Patient presents with   Knee Pain    RT    HPI Jean Hopkins is a 65 y.o. female.   HPI 65 year old female presents with right knee pain for 3 weeks.  Reports pain is worse when moving from sitting to standing.  Denies injury or insult to right knee.  Additionally, patient request Diflucan for vaginal itching due to recent antibiotic course.  Past Medical History:  Diagnosis Date   AF ([facility]) (HCC)    Allergy    Anemia    Blood transfusion without reported diagnosis    Cancer ([facility])    Melanoma x2   Cataract    [facility]     Patient Active Problem List   Diagnosis Date Noted   [facility]    AF ([facility]) ([place])     Past Surgical History:  Procedure Laterality Date   ABDOMINAL HYSTERECTOMY     CHOLECYSTECTOMY      OB History   No obstetric history on file.      Home Medications    Prior to Admission medications   Medication Sig Start Date End Date Taking? Authorizing Provider  celecoxib (CELEBREX) 100 MG capsule Take 1 capsule (100 mg total) by mouth 2 (two) times daily for 14 days. 12/17/20 12/31/20 Yes Eliezer Lofts, FNP  fluconazole (DIFLUCAN) 150 MG tablet Take 1 tablet (150 mg total) by mouth daily for 2 days. 12/17/20 12/19/20 Yes Eliezer Lofts, FNP  amoxicillin-clavulanate (AUGMENTIN) 875-125 MG tablet Take 1 tablet by mouth every 12 (twelve) hours. 12/04/20   Eliezer Lofts, FNP  Cholecalciferol (VITAMIN D PO) Take 1 tablet by mouth daily.    [provider]  fexofenadine (ALLEGRA ALLERGY) 180 MG tablet Take 1 tablet (180 mg total) by mouth daily for 15 days. 12/04/20 12/19/20  Eliezer Lofts, FNP  methimazole (TAPAZOLE) 5 MG tablet TAKE 1/2 TABLET BY MOUTH DAILY 10/04/20   Renato Shin, MD  OMEPRAZOLE PO Take by mouth.    [provider]  predniSONE (DELTASONE) 20 MG tablet Take  3 tabs PO daily x 5 days. 12/04/20   Eliezer Lofts, FNP    Family History Family History  Problem Relation Age of Onset   Stomach cancer Paternal Grandmother    Thyroid disease Neg Hx    Colon cancer Neg Hx    Esophageal cancer Neg Hx    Rectal cancer Neg Hx     Social History Social History   Tobacco Use   Smoking status: Former    Pack years: 0.00   Smokeless tobacco: Never  Substance Use Topics   Alcohol use: Yes    Comment: occasional wine   Drug use: Never     Allergies   Patient has no known allergies.   Review of Systems Review of Systems  Constitutional: Negative.   HENT: Negative.    Eyes: Negative.   Respiratory: Negative.    Cardiovascular: Negative.   Gastrointestinal: Negative.   Genitourinary: Negative.   Musculoskeletal:        Right knee pain x3 weeks  Skin: Negative.   Neurological: Negative.     Physical Exam Triage Vital Signs ED Triage Vitals  Enc Vitals Group     BP 12/17/20 0852 (!) 158/99     Pulse Rate 12/17/20 0852 86     Resp 12/17/20 0852 18  Temp 12/17/20 0852 97.8 F (36.6 C)     Temp Source 12/17/20 0852 Oral     SpO2 12/17/20 0852 96 %     Weight --      Height --      Head Circumference --      Peak Flow --      Pain Score 12/17/20 0855 4     Pain Loc --      Pain Edu? --      Excl. in [place]? --    No data found.  Updated Vital Signs BP (!) 158/99 (BP Location: Right Arm)   Pulse 86   Temp 97.8 F (36.6 C) (Oral)   Resp 18   SpO2 96%    Physical Exam Vitals and nursing note reviewed.  Constitutional:      General: She is not in acute distress.    Appearance: Normal appearance. She is obese. She is not ill-appearing.  HENT:     Head: Normocephalic and atraumatic.     Mouth/Throat:     Mouth: Mucous membranes are moist.     Pharynx: Oropharynx is clear.  Eyes:     Extraocular Movements: Extraocular movements intact.     Conjunctiva/sclera: Conjunctivae normal.     Pupils: Pupils are equal, round,  and reactive to light.  Cardiovascular:     Rate and Rhythm: Normal rate and regular rhythm.     Pulses: Normal pulses.     Heart sounds: Normal heart sounds.  Pulmonary:     Effort: Pulmonary effort is normal. No respiratory distress.     Breath sounds: Normal breath sounds. No wheezing, rhonchi or rales.  Musculoskeletal:     Cervical back: Normal range of motion and neck supple.     Comments: Right knee: TTP over medial/inferior aspect of patella, no soft tissue swelling noted, negative Lachman's, negative anterior drawer  Skin:    General: Skin is warm and dry.  Neurological:     General: No focal deficit present.     Mental Status: She is alert and oriented to person, place, and time.  Psychiatric:        Mood and Affect: Mood normal.        Behavior: Behavior normal.     UC Treatments / Results  Labs (all labs ordered are listed, but only abnormal results are displayed) Labs Reviewed - No data to display  EKG   Radiology DG Knee Complete 4 Views Right  Result Date: 12/17/2020 CLINICAL DATA:  Right knee pain, no known injury EXAM: RIGHT KNEE - COMPLETE 4+ VIEW COMPARISON:  None. FINDINGS: No fracture or dislocation of the right knee. Mild tricompartmental joint space narrowing and osteophytosis. Enthesopathic change of the patellar poles. No knee joint effusion. Soft tissues are unremarkable. IMPRESSION: No fracture or dislocation of the right knee. Mild tricompartmental joint space narrowing and osteophytosis. Electronically Signed   By: Eddie Candle M.D.   On: 12/17/2020 09:48    Procedures Procedures (including critical care time)  Medications Ordered in UC Medications - No data to display  Initial Impression / Assessment and Plan / UC Course  I have reviewed the triage vital signs and the nursing notes.  Pertinent labs & imaging results that were available during my care of the patient were reviewed by me and considered in my medical decision making (see chart for  details).     MDM: 1. Acute right knee pain, 2.  Osteoarthritis of right knee, unspecified osteoarthritis type, 3. Vaginal  itching.  Patient discharged home, hemodynamically stable. Final Clinical Impressions(s) / UC Diagnoses   Final diagnoses:  Acute pain of right knee  Osteoarthritis of right knee, unspecified osteoarthritis type  Vaginal itching     Discharge Instructions      Advised patient take medication as directed with food to completion.  Encourage patient increase daily water intake while taking this medication.  If symptoms are unresolved and or worsening please follow-up with PCP or orthopedics provided above.     ED Prescriptions     Medication Sig Dispense Auth. Provider   fluconazole (DIFLUCAN) 150 MG tablet Take 1 tablet (150 mg total) by mouth daily for 2 days. 2 tablet Eliezer Lofts, FNP   celecoxib (CELEBREX) 100 MG capsule Take 1 capsule (100 mg total) by mouth 2 (two) times daily for 14 days. 28 capsule Eliezer Lofts, FNP      PDMP not reviewed this encounter.   Eliezer Lofts, [place] 12/17/20 1007

## 2020-12-19 ENCOUNTER — Emergency Department (HOSPITAL_BASED_OUTPATIENT_CLINIC_OR_DEPARTMENT_OTHER): Payer: BC Managed Care – PPO

## 2020-12-19 ENCOUNTER — Encounter (HOSPITAL_BASED_OUTPATIENT_CLINIC_OR_DEPARTMENT_OTHER): Payer: Self-pay | Admitting: Urology

## 2020-12-19 ENCOUNTER — Emergency Department (HOSPITAL_BASED_OUTPATIENT_CLINIC_OR_DEPARTMENT_OTHER)
Admission: EM | Admit: 2020-12-19 | Discharge: 2020-12-19 | Disposition: A | Discharge status: Home / Self Care | Payer: BC Managed Care – PPO | Attending: Emergency Medicine | Admitting: Emergency Medicine

## 2020-12-19 ENCOUNTER — Other Ambulatory Visit: Payer: Self-pay

## 2020-12-19 DIAGNOSIS — M25561 Pain in right knee: Secondary | ICD-10-CM

## 2020-12-19 DIAGNOSIS — X509XXA Other and unspecified overexertion or strenuous movements or postures, initial encounter: Secondary | ICD-10-CM | POA: Insufficient documentation

## 2020-12-19 DIAGNOSIS — Z87891 Personal history of nicotine dependence: Secondary | ICD-10-CM | POA: Insufficient documentation

## 2020-12-19 DIAGNOSIS — M1711 Unilateral primary osteoarthritis, right knee: Secondary | ICD-10-CM | POA: Diagnosis not present

## 2020-12-19 DIAGNOSIS — Z85820 Personal history of malignant melanoma of skin: Secondary | ICD-10-CM | POA: Insufficient documentation

## 2020-12-19 NOTE — ED Provider Notes (Signed)
[facility] Provider Note   CSN: [id number] Arrival date & time: 12/19/20  1745     History Chief Complaint  Patient presents with   Knee Pain    [facility] is a 65 y.o. female.  Patient presents to the emergency department this evening for evaluation of right knee pain.  Patient was seen at urgent care 2 days ago for right knee pain.  She had x-rays which showed osteoarthritis and was started on Celebrex.  She has been taking this and has noted some improvement.  Today she was in her shower and she bent over and felt a pop in her right knee and the knee "gave out".  She had worsening of pain especially with flexion.  No significant knee swelling.  Ice pack applied.  We will give difficulty bearing weight due to pain.  She denies ankle or hip pain.      Past Medical History:  Diagnosis Date   AF (atrial fibrillation) (HCC)    Allergy    Anemia    Blood transfusion without reported diagnosis    Cancer ([place])    Melanoma x2   Cataract    Hyperthyroidism     Patient Active Problem List   Diagnosis Date Noted   Hyperthyroidism    AF (atrial fibrillation) ([place])     Past Surgical History:  Procedure Laterality Date   ABDOMINAL HYSTERECTOMY     CHOLECYSTECTOMY       OB History   No obstetric history on file.     Family History  Problem Relation Age of Onset   Stomach cancer Paternal Grandmother    Thyroid disease Neg Hx    Colon cancer Neg Hx    Esophageal cancer Neg Hx    Rectal cancer Neg Hx     Social History   Tobacco Use   Smoking status: Former    Pack years: 0.00   Smokeless tobacco: Never  Substance Use Topics   Alcohol use: Yes    Comment: occasional wine   Drug use: Never    Home Medications Prior to Admission medications   Medication Sig Start Date End Date Taking? Authorizing Provider  amoxicillin-clavulanate (AUGMENTIN) 875-125 MG tablet Take 1 tablet by mouth every 12 (twelve) hours. 12/04/20   Eliezer Lofts, FNP  celecoxib (CELEBREX) 100 MG capsule Take 1 capsule (100 mg total) by mouth 2 (two) times daily for 14 days. 12/17/20 12/31/20  Eliezer Lofts, FNP  Cholecalciferol (VITAMIN D PO) Take 1 tablet by mouth daily.    [provider]  fexofenadine (ALLEGRA ALLERGY) 180 MG tablet Take 1 tablet (180 mg total) by mouth daily for 15 days. 12/04/20 12/19/20  Eliezer Lofts, FNP  fluconazole (DIFLUCAN) 150 MG tablet Take 1 tablet (150 mg total) by mouth daily for 2 days. 12/17/20 12/19/20  Eliezer Lofts, FNP  methimazole (TAPAZOLE) 5 MG tablet TAKE 1/2 TABLET BY MOUTH DAILY 10/04/20   Renato Shin, MD  OMEPRAZOLE PO Take by mouth.    [provider]  predniSONE (DELTASONE) 20 MG tablet Take 3 tabs PO daily x 5 days. 12/04/20   Eliezer Lofts, [place]    Allergies    Patient has no known allergies.  Review of Systems   Review of Systems  Constitutional:  Negative for activity change.  Musculoskeletal:  Positive for arthralgias and gait problem. Negative for back pain, joint swelling and neck pain.  Skin:  Negative for wound.  Neurological:  Negative for weakness and numbness.  Physical Exam Updated Vital Signs BP (!) 160/86 (BP Location: Left Arm)   Pulse 85   Temp 98.3 F (36.8 C) (Oral)   Resp 18   Ht 5\' 4"  (1.626 m)   Wt 103.4 kg   SpO2 98%   BMI 39.14 kg/m   Physical Exam Vitals and nursing note reviewed.  Constitutional:      Appearance: She is well-developed.  HENT:     Head: Normocephalic and atraumatic.  Eyes:     Pupils: Pupils are equal, round, and reactive to light.  Cardiovascular:     Pulses: Normal pulses. No decreased pulses.  Musculoskeletal:        General: Tenderness present.     Cervical back: Normal range of motion and neck supple.     Right hip: No tenderness.     Left hip: No tenderness.     Right knee: No swelling or effusion. Decreased range of motion. Tenderness present over the medial joint line and lateral joint line.     Right  ankle: No tenderness. Normal range of motion.     Left ankle: No tenderness. Normal range of motion.  Skin:    General: Skin is warm and dry.  Neurological:     Mental Status: She is alert.     Sensory: No sensory deficit.     Comments: Motor, sensation, and vascular distal to the injury is fully intact.   Psychiatric:        Mood and Affect: Mood normal.    ED Results / Procedures / Treatments   Labs (all labs ordered are listed, but only abnormal results are displayed) Labs Reviewed - No data to display  EKG None  Radiology DG Knee Complete 4 Views Right  Result Date: 12/19/2020 CLINICAL DATA:  Patient heard pop in knee.  Pain. EXAM: RIGHT KNEE - COMPLETE 4+ VIEW COMPARISON:  None. FINDINGS: Tricompartmental degenerative changes. Enthesopathic changes associated with the anterior patella. No fracture. No definite joint effusion. No other acute abnormalities. IMPRESSION: Tricompartmental degenerative changes. No other acute abnormalities are noted. Electronically Signed   By: Dorise Bullion III M.D   On: 12/19/2020 18:40    Procedures Procedures   Medications Ordered in ED Medications - No data to display  ED Course  I have reviewed the triage vital signs and the nursing notes.  Pertinent labs & imaging results that were available during my care of the patient were reviewed by me and considered in my medical decision making (see chart for details).  Patient seen and examined. Reviewed previous x-ray.   Vital signs reviewed and are as follows: BP (!) 160/86 (BP Location: Left Arm)   Pulse 85   Temp 98.3 F (36.8 C) (Oral)   Resp 18   Ht 5\' 4"  (1.626 m)   Wt 103.4 kg   SpO2 98%   BMI 39.14 kg/m   X-rays today are unchanged.  I recommended crutches and knee immobilizer.  I would encourage orthopedic follow-up at this point given her symptoms.  Patient in agreement.  She will continue prescribes Celebrex for pain.  Discussed rice protocol.    MDM  Rules/Calculators/A&P                          Patient with knee injury tonight.  No fractures.  Lower extremity is neurovascularly intact.  No evidence of patellar or quadriceps tendon rupture.   Final Clinical Impression(s) / ED Diagnoses Final diagnoses:  Acute pain  of right knee    Rx / DC Orders ED Discharge Orders     None        Carlisle Cater, PA-C 12/19/20 [place], DO 12/19/20 2312

## 2020-12-19 NOTE — Discharge Instructions (Signed)
Please read and follow all provided instructions.  Your diagnoses today include:  1. Acute pain of right knee     Tests performed today include: An x-ray of the affected area - does NOT show any broken bones Vital signs. See below for your results today.   Medications prescribed:  None  Take any prescribed medications only as directed.  Home care instructions:  Follow any educational materials contained in this packet Follow R.I.C.E. Protocol: R - rest your injury  I  - use ice on injury without applying directly to skin C - compress injury with bandage or splint E - elevate the injury as much as possible  Follow-up instructions: Please follow-up with the provided orthopedic physician.  Use crutches in the immobilizer until cleared.  Return instructions:  Please return if your toes or feet are numb or tingling, appear gray or blue, or you have severe pain (also elevate the leg and loosen splint or wrap if you were given one) Please return to the Emergency Department if you experience worsening symptoms.  Please return if you have any other emergent concerns.  Additional Information:  Your vital signs today were: BP (!) 160/86 (BP Location: Left Arm)   Pulse 85   Temp 98.3 F (36.8 C) (Oral)   Resp 18   Ht 5\' 4"  (1.626 m)   Wt 103.4 kg   SpO2 98%   BMI 39.14 kg/m  If your blood pressure (BP) was elevated above 135/85 this visit, please have this repeated by your doctor within one month. --------------

## 2020-12-19 NOTE — ED Notes (Signed)
See EDP assessment 

## 2020-12-19 NOTE — ED Triage Notes (Signed)
Right knee and right lower leg pain since Friday, seen at [facility], xrays done saw OA, started on celebrex,  HEard loud POP in right knee today while in shower

## 2020-12-19 NOTE — ED Notes (Signed)
Brought to exam room FT2, pt states she was in the shower and bent over, felt and heard a pop of her rt knee, pain then became present, having difficulty placing wt on RLE. Placed in room, RLE elevated, ice pack applied.

## 2020-12-19 NOTE — ED Notes (Signed)
Patient transported to radiology via wheelchair

## 2020-12-21 DIAGNOSIS — M25561 Pain in right knee: Secondary | ICD-10-CM | POA: Diagnosis not present

## 2020-12-21 DIAGNOSIS — M1711 Unilateral primary osteoarthritis, right knee: Secondary | ICD-10-CM | POA: Diagnosis not present

## 2021-01-04 DIAGNOSIS — M1711 Unilateral primary osteoarthritis, right knee: Secondary | ICD-10-CM | POA: Insufficient documentation

## 2021-01-05 ENCOUNTER — Ambulatory Visit (INDEPENDENT_AMBULATORY_CARE_PROVIDER_SITE_OTHER): Payer: BC Managed Care – PPO | Admitting: Endocrinology

## 2021-01-05 ENCOUNTER — Other Ambulatory Visit: Payer: Self-pay

## 2021-01-05 VITALS — BP 142/78 | HR 90 | Ht 64.0 in | Wt 228.0 lb

## 2021-01-05 DIAGNOSIS — E0590 Thyrotoxicosis, unspecified without thyrotoxic crisis or storm: Secondary | ICD-10-CM

## 2021-01-05 LAB — T4, FREE: Free T4: 1.2 ng/dL (ref 0.60–1.60)

## 2021-01-05 LAB — TSH: TSH: 1.93 u[IU]/mL (ref 0.35–5.50)

## 2021-01-05 NOTE — Progress Notes (Signed)
Subjective:    Patient ID: [facility], female    DOB: 10/16/1955, 65 y.o.   MRN: [id number]  HPI Pt returns for f/u of hyperthyroidism (dx'ed early 2019, in eval of AF; [place] in 2020 showed multinodular thyroid, with no thyroid nodule meeting criteria for bx or f/u US; tapazole was chosen as initial rx, due to AF; it was stopped in [place], due to hypothyroidism; since then, she has been euthyroid off rx until early 2020, when it was resumed for recurrent hyperthyroidism; she declines RAI).  No recent AF.  pt states she feels well in general.  Specifically, she denies palpitations and tremor.   Past Medical History:  Diagnosis Date   AF (atrial fibrillation) (HCC)    Allergy    Anemia    Blood transfusion without reported diagnosis    Cancer ([place])    Melanoma x2   Cataract    Hyperthyroidism     Past Surgical History:  Procedure Laterality Date   ABDOMINAL HYSTERECTOMY     CHOLECYSTECTOMY      Social History   Socioeconomic History   Marital status: Married    Spouse name: Not on file   Number of children: Not on file   Years of education: Not on file   Highest education level: Not on file  Occupational History   Not on file  Tobacco Use   Smoking status: Former   Smokeless tobacco: Never  Substance and Sexual Activity   Alcohol use: Yes    Comment: occasional wine   Drug use: Never   Sexual activity: Not on file  Other Topics Concern   Not on file  Social History Narrative   Not on file   Social Determinants of Health   Financial Resource Strain: Not on file  Food Insecurity: Not on file  Transportation Needs: Not on file  Physical Activity: Not on file  Stress: Not on file  Social Connections: Not on file  Intimate Partner Violence: Not on file    Current Outpatient Medications on File Prior to Visit  Medication Sig Dispense Refill   celecoxib (CELEBREX) 100 MG capsule Celebrex 100 mg capsule  Take 1 capsule every day by oral route.     Cholecalciferol  (VITAMIN D PO) Take 1 tablet by mouth daily.     methimazole (TAPAZOLE) 5 MG tablet TAKE 1/2 TABLET BY MOUTH DAILY 45 tablet 3   OMEPRAZOLE PO Take by mouth.     fexofenadine (ALLEGRA ALLERGY) 180 MG tablet Take 1 tablet (180 mg total) by mouth daily for 15 days. 15 tablet 0   predniSONE (DELTASONE) 20 MG tablet Take 3 tabs PO daily x 5 days. 15 tablet 0   No current facility-administered medications on file prior to visit.    No Known Allergies  Family History  Problem Relation Age of Onset   Stomach cancer Paternal Grandmother    Thyroid disease Neg Hx    Colon cancer Neg Hx    Esophageal cancer Neg Hx    Rectal cancer Neg Hx     BP (!) 142/78 (BP Location: Right Arm, Patient Position: Sitting, Cuff Size: Large)   Pulse 90   Ht 5\' 4"  (1.626 m)   Wt 228 lb (103.4 kg)   SpO2 99%   BMI 39.14 kg/m    Review of Systems Denies fever.      Objective:   Physical Exam NECK: There is no palpable thyroid enlargement.  No thyroid nodule is palpable.  No palpable  lymphadenopathy at the anterior neck.  Lab Results  Component Value Date   TSH 1.93 01/05/2021   T4TOTAL 11.6 07/29/2017       Assessment & Plan:  Hyperthyroidism: well-controlled.  Please continue the same methimazole.

## 2021-01-05 NOTE — Patient Instructions (Signed)
Blood tests are requested for you today.  We'll let you know about the results.  If ever you have fever while taking methimazole, stop it and call us, even if the reason is obvious, because of the risk of a rare side-effect.   It is best to never miss the medication.  However, if you do miss it, next best is to double up the next time.   Please come back for a follow-up appointment in 6 months.

## 2021-01-14 ENCOUNTER — Encounter: Payer: Self-pay | Admitting: Medical-Surgical

## 2021-01-14 ENCOUNTER — Other Ambulatory Visit: Payer: Self-pay

## 2021-01-14 ENCOUNTER — Ambulatory Visit (INDEPENDENT_AMBULATORY_CARE_PROVIDER_SITE_OTHER): Payer: BC Managed Care – PPO | Admitting: Medical-Surgical

## 2021-01-14 VITALS — BP 140/85 | HR 85 | Temp 98.0°F | Ht 63.0 in | Wt 233.6 lb

## 2021-01-14 DIAGNOSIS — Z7689 Persons encountering health services in other specified circumstances: Secondary | ICD-10-CM

## 2021-01-14 DIAGNOSIS — M1711 Unilateral primary osteoarthritis, right knee: Secondary | ICD-10-CM | POA: Diagnosis not present

## 2021-01-14 DIAGNOSIS — Z23 Encounter for immunization: Secondary | ICD-10-CM | POA: Diagnosis not present

## 2021-01-14 DIAGNOSIS — I480 Paroxysmal atrial fibrillation: Secondary | ICD-10-CM | POA: Diagnosis not present

## 2021-01-14 DIAGNOSIS — K219 Gastro-esophageal reflux disease without esophagitis: Secondary | ICD-10-CM | POA: Insufficient documentation

## 2021-01-14 DIAGNOSIS — E0590 Thyrotoxicosis, unspecified without thyrotoxic crisis or storm: Secondary | ICD-10-CM | POA: Diagnosis not present

## 2021-01-14 NOTE — Patient Instructions (Signed)
Tdap (Tetanus, Diphtheria, Pertussis) Vaccine: What You Need to Know 1. Why get vaccinated? Tdap vaccine can prevent tetanus, diphtheria, and pertussis. Diphtheria and pertussis spread from person to person. Tetanus enters the body through cuts or wounds. TETANUS (T) causes painful stiffening of the muscles. Tetanus can lead to serious health problems, including being unable to open the mouth, having trouble swallowing and breathing, or death. DIPHTHERIA (D) can lead to difficulty breathing, heart failure, paralysis, or death. PERTUSSIS (aP), also known as "whooping cough," can cause uncontrollable, violent coughing that makes it hard to breathe, eat, or drink. Pertussis can be extremely serious especially in babies and young children, causing pneumonia, convulsions, brain damage, or death. In teens and adults, it can cause weight loss, loss of bladder control, passing out, and rib fractures from severe coughing. 2. Tdap vaccine Tdap is only for children 7 years and older, adolescents, and adults.  Adolescents should receive a single dose of Tdap, preferably at age 11 or 12 years. Pregnant people should get a dose of Tdap during every pregnancy, preferably during the early part of the third trimester, to help protect the newborn from pertussis. Infants are most at risk for severe, life-threatening complications frompertussis. Adults who have never received Tdap should get a dose of Tdap. Also, adults should receive a booster dose of either Tdap or Td (a different vaccine that protects against tetanus and diphtheria but not pertussis) every 10 years, or after 5 years in the case of a severe or dirty wound or burn. Tdap may be given at the same time as other vaccines. 3. Talk with your health care provider Tell your vaccine provider if the person getting the vaccine: Has had an allergic reaction after a previous dose of any vaccine that protects against tetanus, diphtheria, or pertussis, or has any  severe, life-threatening allergies Has had a coma, decreased level of consciousness, or prolonged seizures within 7 days after a previous dose of any pertussis vaccine (DTP, DTaP, or Tdap) Has seizures or another nervous system problem Has ever had Guillain-Barr Syndrome (also called "GBS") Has had severe pain or swelling after a previous dose of any vaccine that protects against tetanus or diphtheria In some cases, your health care provider may decide to postpone Tdapvaccination until a future visit. People with minor illnesses, such as a cold, may be vaccinated. People who are moderately or severely ill should usually wait until they recover beforegetting Tdap vaccine.  Your health care provider can give you more information. 4. Risks of a vaccine reaction Pain, redness, or swelling where the shot was given, mild fever, headache, feeling tired, and nausea, vomiting, diarrhea, or stomachache sometimes happen after Tdap vaccination. People sometimes faint after medical procedures, including vaccination. Tellyour provider if you feel dizzy or have vision changes or ringing in the ears.  As with any medicine, there is a very remote chance of a vaccine causing asevere allergic reaction, other serious injury, or death. 5. What if there is a serious problem? An allergic reaction could occur after the vaccinated person leaves the clinic. If you see signs of a severe allergic reaction (hives, swelling of the face and throat, difficulty breathing, a fast heartbeat, dizziness, or weakness), call 9-1-1and get the person to the nearest hospital. For other signs that concern you, call your health care provider.  Adverse reactions should be reported to the Vaccine Adverse Event Reporting System (VAERS). Your health care provider will usually file this report, or you can do it yourself. Visit the   VAERS website at www.vaers.hhs.gov or call 1-800-822-7967. VAERS is only for reporting reactions, and VAERS staff  members do not give medical advice. 6. The National Vaccine Injury Compensation Program The National Vaccine Injury Compensation Program (VICP) is a federal program that was created to compensate people who may have been injured by certain vaccines. Claims regarding alleged injury or death due to vaccination have a time limit for filing, which may be as short as two years. Visit the VICP website at www.hrsa.gov/vaccinecompensation or call 1-800-338-2382to learn about the program and about filing a claim. 7. How can I learn more? Ask your health care provider. Call your local or state health department. Visit the website of the Food and Drug Administration (FDA) for vaccine package inserts and additional information at www.fda.gov/vaccines-blood-biologics/vaccines. Contact the Centers for Disease Control and Prevention (CDC): Call 1-800-232-4636 (1-800-CDC-INFO) or Visit CDC's website at www.cdc.gov/vaccines. Vaccine Information Statement Tdap (Tetanus, Diphtheria, Pertussis) Vaccine(01/30/2020) This information is not intended to replace advice given to you by your health care provider. Make sure you discuss any questions you have with your healthcare provider. Document Revised: 02/25/2020 Document Reviewed: 02/25/2020 Elsevier Patient Education  2022 [employer].  

## 2021-01-14 NOTE — Progress Notes (Signed)
New Patient Office Visit  Subjective:  Patient ID: Jean Hopkins, female    DOB: September 26, 1955  Age: 65 y.o. MRN: [id number]  CC:  Chief Complaint  Patient presents with   Establish Care    HPI Jean Hopkins presents to establish care.  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  She does have a strong history of neuropathy and has begun to experience some numbness and tingling of her right toes.  This has been going on for a while and she does not take any prescription treatment for this. She does have some issues with reflux, especially at night.  She started herself on omeprazole 20 mg daily every morning which controls her symptoms very well.  She does have some joint pain, specifically in the right knee.  She is followed by Ortho for this.  She is taking Celebrex twice daily which is helping with her knee pain.  She is a postmenopausal female with a partial abdominal hysterectomy.  She does have some occasional night sweats but no other postmenopausal symptoms.  She does have a history of A. fib which was determined to be secondary to hyperthyroidism.  She is followed by endocrinology who prescribes methimazole 2.5 mg daily.  She has been on this for approximately 1 year and is doing very well.    Past Medical History:  Diagnosis Date   AF (atrial fibrillation) (HCC)    Allergy    Anemia    Blood transfusion without reported diagnosis    Cancer ([place])    Melanoma x2   Cataract    Hyperthyroidism    Past Surgical History:  Procedure Laterality Date   ABDOMINAL HYSTERECTOMY     CHOLECYSTECTOMY      Family History  Problem Relation Age of Onset   Stomach cancer Paternal Grandmother    Thyroid disease Neg Hx    Colon cancer Neg Hx    Esophageal cancer Neg Hx    Rectal cancer Neg Hx    Social History   Socioeconomic History   Marital status: Married    Spouse name: Not on file   Number of children: Not on  file   Years of education: Not on file   Highest education level: Not on file  Occupational History   Not on file  Tobacco Use   Smoking status: Former    Types: Cigarettes    Quit date: 36    Years since quitting: 40.5   Smokeless tobacco: Never  Vaping Use   Vaping Use: Never used  Substance and Sexual Activity   Alcohol use: Yes    Comment: Occasionally   Drug use: Never   Sexual activity: Yes    Birth control/protection: Surgical    Comment: hysterectomy  Other Topics Concern   Not on file  Social History Narrative   Not on file   Social Determinants of Health   Financial Resource Strain: Not on file  Food Insecurity: Not on file  Transportation Needs: Not on file  Physical Activity: Not on file  Stress: Not on file  Social Connections: Not on file  Intimate Partner Violence: Not on file    ROS Review of Systems  Constitutional:  Positive for diaphoresis (Night sweats). Negative for chills, fatigue, fever and unexpected weight change.  HENT:  Negative for congestion, rhinorrhea, sinus pressure and sore throat.   Respiratory:  Negative for cough, chest tightness and shortness of  breath.   Cardiovascular:  Negative for chest pain, palpitations and leg swelling.  Gastrointestinal:  Negative for abdominal pain, constipation, diarrhea, nausea and vomiting.       Heartburn  Endocrine: Negative for cold intolerance and heat intolerance.  Genitourinary:  Negative for dysuria, frequency, urgency, vaginal bleeding and vaginal discharge.  Musculoskeletal:  Positive for arthralgias (Right knee).  Skin:  Negative for rash and wound.  Neurological:  Positive for numbness (Right toes). Negative for dizziness, light-headedness and headaches.  Hematological:  Does not bruise/bleed easily.  Psychiatric/Behavioral:  Positive for dysphoric mood. Negative for self-injury, sleep disturbance and suicidal ideas. The patient is not nervous/anxious.    Objective:   Today's Vitals:  BP 140/85   Pulse 85   Temp 98 F (36.7 C)   Ht '5\' 3"'$  (1.6 m)   Wt 233 lb 10.1 oz (106 kg)   SpO2 97%   BMI 41.39 kg/m   Physical Exam Vitals reviewed.  Constitutional:      General: She is not in acute distress.    Appearance: Normal appearance.  HENT:     Head: Normocephalic and atraumatic.  Cardiovascular:     Rate and Rhythm: Normal rate and regular rhythm.     Pulses: Normal pulses.     Heart sounds: Normal heart sounds. No murmur heard.   No friction rub. No gallop.  Pulmonary:     Effort: Pulmonary effort is normal. No respiratory distress.     Breath sounds: Normal breath sounds. No wheezing.  Skin:    General: Skin is warm and dry.  Neurological:     Mental Status: She is alert and oriented to person, place, and time.  Psychiatric:        Mood and Affect: Mood normal.        Behavior: Behavior normal.        Thought Content: Thought content normal.        Judgment: Judgment normal.    Assessment & Plan:   1. Encounter to establish care Reviewed available information and discussed healthcare concerns with patient.  2. Hyperthyroidism Managed by endocrinology.  3. Paroxysmal atrial fibrillation (HCC) Resolved with treatment for hyperthyroidism.  4. Arthritis of right knee Managed by orthopedics.  5. Gastroesophageal reflux disease without esophagitis Continue omeprazole 20 mg daily.  6. Need for Tdap vaccination Tdap vaccine given in office today. - Tdap vaccine greater than or equal to 7yo IM   Outpatient Encounter Medications as of 01/14/2021  Medication Sig   celecoxib (CELEBREX) 100 MG capsule Take 100 mg by mouth 2 (two) times daily.   Cholecalciferol (VITAMIN D PO) Take 5,000 Units by mouth daily.   methimazole (TAPAZOLE) 5 MG tablet TAKE 1/2 TABLET BY MOUTH DAILY (Patient taking differently: Take 5 mg by mouth every other day.)   Omeprazole 20 MG TBEC Take 1 tablet by mouth daily.   [DISCONTINUED] fexofenadine (ALLEGRA ALLERGY) 180 MG  tablet Take 1 tablet (180 mg total) by mouth daily for 15 days.   [DISCONTINUED] predniSONE (DELTASONE) 20 MG tablet Take 3 tabs PO daily x 5 days.   No facility-administered encounter medications on file as of 01/14/2021.    Follow-up: Return for annual physical exam at your convenience.   Clearnce Sorrel, DNP, APRN, FNP-BC [facility]

## 2021-01-31 ENCOUNTER — Telehealth: Payer: Self-pay

## 2021-01-31 NOTE — Telephone Encounter (Signed)
Attempted to get PAP records from [facility]. Received fax stating she was never a pt at that clinic.

## 2021-02-02 ENCOUNTER — Encounter: Payer: Self-pay | Admitting: Medical-Surgical

## 2021-02-02 DIAGNOSIS — I10 Essential (primary) hypertension: Secondary | ICD-10-CM | POA: Insufficient documentation

## 2021-02-02 DIAGNOSIS — C439 Malignant melanoma of skin, unspecified: Secondary | ICD-10-CM | POA: Insufficient documentation

## 2021-02-02 DIAGNOSIS — I7300 Raynaud's syndrome without gangrene: Secondary | ICD-10-CM | POA: Insufficient documentation

## 2021-02-08 ENCOUNTER — Ambulatory Visit: Payer: BC Managed Care – PPO | Admitting: Family

## 2021-02-17 ENCOUNTER — Encounter: Payer: Self-pay | Admitting: Medical-Surgical

## 2021-02-17 ENCOUNTER — Other Ambulatory Visit: Payer: Self-pay

## 2021-02-17 ENCOUNTER — Ambulatory Visit (INDEPENDENT_AMBULATORY_CARE_PROVIDER_SITE_OTHER): Payer: BC Managed Care – PPO | Admitting: Medical-Surgical

## 2021-02-17 VITALS — BP 135/86 | HR 82 | Resp 20 | Wt 228.0 lb

## 2021-02-17 DIAGNOSIS — Z0000 Encounter for general adult medical examination without abnormal findings: Secondary | ICD-10-CM | POA: Diagnosis not present

## 2021-02-17 DIAGNOSIS — Z87898 Personal history of other specified conditions: Secondary | ICD-10-CM

## 2021-02-17 NOTE — Patient Instructions (Signed)
Preventive Care 63-63 Years Old, Female Preventive care refers to lifestyle choices and visits with your health care provider that can promote health and wellness. This includes: A yearly physical exam. This is also called an annual wellness visit. Regular dental and eye exams. Immunizations. Screening for certain conditions. Healthy lifestyle choices, such as: Eating a healthy diet. Getting regular exercise. Not using drugs or products that contain nicotine and tobacco. Limiting alcohol use. What can I expect for my preventive care visit? Physical exam Your health care provider will check your: Height and weight. These may be used to calculate your BMI (body mass index). BMI is a measurement that tells if you are at a healthy weight. Heart rate and blood pressure. Body temperature. Skin for abnormal spots. Counseling Your health care provider may ask you questions about your: Past medical problems. Family's medical history. Alcohol, tobacco, and drug use. Emotional well-being. Home life and relationship well-being. Sexual activity. Diet, exercise, and sleep habits. Work and work Statistician. Access to firearms. Method of birth control. Menstrual cycle. Pregnancy history. What immunizations do I need?  Vaccines are usually given at various ages, according to a schedule. Your health care provider will recommend vaccines for you based on your age, medicalhistory, and lifestyle or other factors, such as travel or where you work. What tests do I need? Blood tests Lipid and cholesterol levels. These may be checked every 5 years, or more often if you are over 63 years old. Hepatitis C test. Hepatitis B test. Screening Lung cancer screening. You may have this screening every year starting at age 63 if you have a 30-pack-year history of smoking and currently smoke or have quit within the past 15 years. Colorectal cancer screening. All adults should have this screening starting at  age 63 and continuing until age 63. Your health care provider may recommend screening at age 63 if you are at increased risk. You will have tests every 1-10 years, depending on your results and the type of screening test. Diabetes screening. This is done by checking your blood sugar (glucose) after you have not eaten for a while (fasting). You may have this done every 1-3 years. Mammogram. This may be done every 1-2 years. Talk with your health care provider about when you should start having regular mammograms. This may depend on whether you have a family history of breast cancer. BRCA-related cancer screening. This may be done if you have a family history of breast, ovarian, tubal, or peritoneal cancers. Pelvic exam and Pap test. This may be done every 3 years starting at age 63. Starting at age 63, this may be done every 5 years if you have a Pap test in combination with an HPV test. Other tests STD (sexually transmitted disease) testing, if you are at risk. Bone density scan. This is done to screen for osteoporosis. You may have this scan if you are at high risk for osteoporosis. Talk with your health care provider about your test results, treatment options,and if necessary, the need for more tests. Follow these instructions at home: Eating and drinking  Eat a diet that includes fresh fruits and vegetables, whole grains, lean protein, and low-fat dairy products. Take vitamin and mineral supplements as recommended by your health care provider. Do not drink alcohol if: Your health care provider tells you not to drink. You are pregnant, may be pregnant, or are planning to become pregnant. If you drink alcohol: Limit how much you have to 0-1 drink a day. Be aware  of how much alcohol is in your drink. In the [place] one drink equals one 12 oz bottle of beer (355 mL), one 5 oz glass of wine (148 mL), or one 1 oz glass of hard liquor (44 mL).  Lifestyle Take daily care of your teeth and  gums. Brush your teeth every morning and night with fluoride toothpaste. Floss one time each day. Stay active. Exercise for at least 30 minutes 5 or more days each week. Do not use any products that contain nicotine or tobacco, such as cigarettes, e-cigarettes, and chewing tobacco. If you need help quitting, ask your health care provider. Do not use drugs. If you are sexually active, practice safe sex. Use a condom or other form of protection to prevent STIs (sexually transmitted infections). If you do not wish to become pregnant, use a form of birth control. If you plan to become pregnant, see your health care provider for a prepregnancy visit. If told by your health care provider, take low-dose aspirin daily starting at age 29. Find healthy ways to cope with stress, such as: Meditation, yoga, or listening to music. Journaling. Talking to a trusted person. Spending time with friends and family. Safety Always wear your seat belt while driving or riding in a vehicle. Do not drive: If you have been drinking alcohol. Do not ride with someone who has been drinking. When you are tired or distracted. While texting. Wear a helmet and other protective equipment during sports activities. If you have firearms in your house, make sure you follow all gun safety procedures. What's next? Visit your health care provider once a year for an annual wellness visit. Ask your health care provider how often you should have your eyes and teeth checked. Stay up to date on all vaccines. This information is not intended to replace advice given to you by your health care provider. Make sure you discuss any questions you have with your healthcare provider. Document Revised: 03/16/2020 Document Reviewed: 02/21/2018 Elsevier Patient Education  2022 [employer].

## 2021-02-17 NOTE — Progress Notes (Signed)
HPI: Jean Hopkins is a 65 y.o. female who  has a past medical history of AF (atrial fibrillation) ([place]), Allergy, Anemia, Arthritis (11/2020), Blood transfusion without reported diagnosis, Cancer ([place]), Cataract, and Hyperthyroidism.  she presents to [facility] today, 02/17/21,  for chief complaint of: Annual physical exam  Dentist: Every 6 months, no concerns Eye exam: Yearly, wears contacts, no concerns Exercise: Walking although this is limited by her knee issues, riding a stationary bike Diet: Reduce portions, healthy snacks Pap smear: hysterectomy Mammogram: done 04/2020, normal Colon cancer screening: done 2020, due in 7 years COVID vaccine: done with 1 booster  Concerns: None  Past medical, surgical, social and family history reviewed:  Patient Active Problem List   Diagnosis Date Noted   Hypertension 02/02/2021   Melanoma of skin ([place]) 02/02/2021   Raynaud's phenomenon 02/02/2021   Arthritis of right knee 01/14/2021   Gastroesophageal reflux disease without esophagitis 01/14/2021   Need for Tdap vaccination 01/14/2021   Osteoarthritis of right knee 01/04/2021   Hyperthyroidism    Atrial fibrillation ([place]) 07/29/2017    Past Surgical History:  Procedure Laterality Date   ABDOMINAL HYSTERECTOMY  2007   CHOLECYSTECTOMY  1988    Social History   Tobacco Use   Smoking status: Former    Packs/day: 0.50    Years: 1.00    Pack years: 0.50    Types: Cigarettes    Quit date: 10/24/1980    Years since quitting: 40.3   Smokeless tobacco: Never  Substance Use Topics   Alcohol use: Yes    Comment: Once or twice a month    Family History  Problem Relation Age of Onset   Hypertension Mother    Hyperlipidemia Mother    Multiple sclerosis Mother    Osteoporosis Mother    Hyperlipidemia Father    Hypertension Father    Heart disease Father    Hypertension Brother    Hypertension Brother    Hypertension Brother    Diverticulitis  Brother    Stomach cancer Paternal Grandmother    Breast cancer Paternal Aunt    Polycystic ovary syndrome Child    Heart disease Brother    Hypertension Brother    Hypertension Brother    Thyroid disease Neg Hx    Colon cancer Neg Hx    Esophageal cancer Neg Hx    Rectal cancer Neg Hx      Current medication list and allergy/intolerance information reviewed:    Current Outpatient Medications  Medication Sig Dispense Refill   celecoxib (CELEBREX) 100 MG capsule Take 100 mg by mouth 2 (two) times daily.     Cholecalciferol (VITAMIN D PO) Take 5,000 Units by mouth daily.     methimazole (TAPAZOLE) 5 MG tablet TAKE 1/2 TABLET BY MOUTH DAILY (Patient taking differently: Take 5 mg by mouth every other day.) 45 tablet 3   Omeprazole 20 MG TBEC Take 1 tablet by mouth daily.     No current facility-administered medications for this visit.    No Known Allergies    Review of Systems: Constitutional:  No  fever, no chills, No recent illness, No unintentional weight changes. No significant fatigue.  HEENT: No  headache, no vision change, no hearing change, No sore throat, No  sinus pressure Cardiac: No  chest pain, No  pressure, No palpitations, No  Orthopnea Respiratory:  No  shortness of breath. No  Cough Gastrointestinal: No  abdominal pain, No  nausea, No  vomiting,  No  blood in stool, No  diarrhea, No  constipation  Musculoskeletal: No new myalgia/arthralgia Skin: No  Rash, No other wounds/concerning lesions Genitourinary: No  incontinence, No  abnormal genital bleeding, No abnormal genital discharge Hem/Onc: No  easy bruising/bleeding, No  abnormal lymph node Endocrine: No cold intolerance,  No heat intolerance. No polyuria/polydipsia/polyphagia  Neurologic: No  weakness, No  dizziness, No  slurred speech/focal weakness/facial droop Psychiatric: No  concerns with depression, No  concerns with anxiety, No sleep problems, No mood problems  Exam:  BP 135/86 (BP Location: Left Arm,  Patient Position: Sitting, Cuff Size: Large)   Pulse 82   Resp 20   Wt 228 lb (103.4 kg)   SpO2 97%   BMI 40.39 kg/m  Constitutional: VS see above. General Appearance: alert, well-developed, well-nourished, NAD Eyes: Normal lids and conjunctive, non-icteric sclera Ears, Nose, Mouth, Throat: MMM, Normal external inspection ears/nares/mouth/lips/gums. TM normal bilaterally.  Neck: No masses, trachea midline. No thyroid enlargement. No tenderness/mass appreciated. No lymphadenopathy Respiratory: Normal respiratory effort. no wheeze, no rhonchi, no rales Cardiovascular: S1/S2 normal, no murmur, no rub/gallop auscultated. RRR. No lower extremity edema. Pedal pulse II/IV bilaterally DP and PT. No carotid bruit or JVD. No abdominal aortic bruit. Gastrointestinal: Nontender, no masses. No hepatomegaly, no splenomegaly. No hernia appreciated. Bowel sounds normal. Rectal exam deferred.  Musculoskeletal: Gait normal. No clubbing/cyanosis of digits.  Neurological: Normal balance/coordination. No tremor. No cranial nerve deficit on limited exam. Motor and sensation intact and symmetric. Cerebellar reflexes intact.  Skin: warm, dry, intact. No rash/ulcer. No concerning nevi or subq nodules on limited exam.   Psychiatric: Normal judgment/insight. Normal mood and affect. Oriented x3.    ASSESSMENT/PLAN:   1. Encounter for annual physical exam Checking CBC with differential, CMP, and lipid panel today. - CBC with Differential/Platelet - Lipid panel - COMPLETE METABOLIC PANEL WITH GFR  2. History of prediabetes Checking hemoglobin A1c. - Hemoglobin A1c   Orders Placed This Encounter  Procedures   CBC with Differential/Platelet   Lipid panel   COMPLETE METABOLIC PANEL WITH GFR   Hemoglobin A1c    No orders of the defined types were placed in this encounter.   Patient Instructions  Preventive Care 50-47 Years Old, Female Preventive care refers to lifestyle choices and visits with your  health care provider that can promote health and wellness. This includes: A yearly physical exam. This is also called an annual wellness visit. Regular dental and eye exams. Immunizations. Screening for certain conditions. Healthy lifestyle choices, such as: Eating a healthy diet. Getting regular exercise. Not using drugs or products that contain nicotine and tobacco. Limiting alcohol use. What can I expect for my preventive care visit? Physical exam Your health care provider will check your: Height and weight. These may be used to calculate your BMI (body mass index). BMI is a measurement that tells if you are at a healthy weight. Heart rate and blood pressure. Body temperature. Skin for abnormal spots. Counseling Your health care provider may ask you questions about your: Past medical problems. Family's medical history. Alcohol, tobacco, and drug use. Emotional well-being. Home life and relationship well-being. Sexual activity. Diet, exercise, and sleep habits. Work and work Statistician. Access to firearms. Method of birth control. Menstrual cycle. Pregnancy history. What immunizations do I need?  Vaccines are usually given at various ages, according to a schedule. Your health care provider will recommend vaccines for you based on your age, medicalhistory, and lifestyle or other factors, such as travel or where you work.  What tests do I need? Blood tests Lipid and cholesterol levels. These may be checked every 5 years, or more often if you are over 64 years old. Hepatitis C test. Hepatitis B test. Screening Lung cancer screening. You may have this screening every year starting at age 14 if you have a 30-pack-year history of smoking and currently smoke or have quit within the past 15 years. Colorectal cancer screening. All adults should have this screening starting at age 47 and continuing until age 32. Your health care provider may recommend screening at age 30 if you are  at increased risk. You will have tests every 1-10 years, depending on your results and the type of screening test. Diabetes screening. This is done by checking your blood sugar (glucose) after you have not eaten for a while (fasting). You may have this done every 1-3 years. Mammogram. This may be done every 1-2 years. Talk with your health care provider about when you should start having regular mammograms. This may depend on whether you have a family history of breast cancer. BRCA-related cancer screening. This may be done if you have a family history of breast, ovarian, tubal, or peritoneal cancers. Pelvic exam and Pap test. This may be done every 3 years starting at age 26. Starting at age 37, this may be done every 5 years if you have a Pap test in combination with an HPV test. Other tests STD (sexually transmitted disease) testing, if you are at risk. Bone density scan. This is done to screen for osteoporosis. You may have this scan if you are at high risk for osteoporosis. Talk with your health care provider about your test results, treatment options,and if necessary, the need for more tests. Follow these instructions at home: Eating and drinking  Eat a diet that includes fresh fruits and vegetables, whole grains, lean protein, and low-fat dairy products. Take vitamin and mineral supplements as recommended by your health care provider. Do not drink alcohol if: Your health care provider tells you not to drink. You are pregnant, may be pregnant, or are planning to become pregnant. If you drink alcohol: Limit how much you have to 0-1 drink a day. Be aware of how much alcohol is in your drink. In the [place] one drink equals one 12 oz bottle of beer (355 mL), one 5 oz glass of wine (148 mL), or one 1 oz glass of hard liquor (44 mL).  Lifestyle Take daily care of your teeth and gums. Brush your teeth every morning and night with fluoride toothpaste. Floss one time each day. Stay active.  Exercise for at least 30 minutes 5 or more days each week. Do not use any products that contain nicotine or tobacco, such as cigarettes, e-cigarettes, and chewing tobacco. If you need help quitting, ask your health care provider. Do not use drugs. If you are sexually active, practice safe sex. Use a condom or other form of protection to prevent STIs (sexually transmitted infections). If you do not wish to become pregnant, use a form of birth control. If you plan to become pregnant, see your health care provider for a prepregnancy visit. If told by your health care provider, take low-dose aspirin daily starting at age 49. Find healthy ways to cope with stress, such as: Meditation, yoga, or listening to music. Journaling. Talking to a trusted person. Spending time with friends and family. Safety Always wear your seat belt while driving or riding in a vehicle. Do not drive: If you have been  drinking alcohol. Do not ride with someone who has been drinking. When you are tired or distracted. While texting. Wear a helmet and other protective equipment during sports activities. If you have firearms in your house, make sure you follow all gun safety procedures. What's next? Visit your health care provider once a year for an annual wellness visit. Ask your health care provider how often you should have your eyes and teeth checked. Stay up to date on all vaccines. This information is not intended to replace advice given to you by your health care provider. Make sure you discuss any questions you have with your healthcare provider. Document Revised: 03/16/2020 Document Reviewed: 02/21/2018 Elsevier Patient Education  2022 [employer].  Follow-up plan: Return in about 1 year (around 02/17/2022) for annual physical exam.  Clearnce Sorrel, DNP, APRN, FNP-BC [facility]

## 2021-02-18 LAB — CBC WITH DIFFERENTIAL/PLATELET
Absolute Monocytes: 752 cells/uL (ref 200–950)
Basophils Absolute: 82 cells/uL (ref 0–200)
Basophils Relative: 0.8 %
Eosinophils Absolute: 134 cells/uL (ref 15–500)
Eosinophils Relative: 1.3 %
HCT: 47.7 % — ABNORMAL HIGH (ref 35.0–45.0)
Hemoglobin: 15.8 g/dL — ABNORMAL HIGH (ref 11.7–15.5)
Lymphs Abs: 2534 cells/uL (ref 850–3900)
MCH: 27 pg (ref 27.0–33.0)
MCHC: 33.1 g/dL (ref 32.0–36.0)
MCV: 81.5 fL (ref 80.0–100.0)
MPV: 10.3 fL (ref 7.5–12.5)
Monocytes Relative: 7.3 %
Neutro Abs: 6798 cells/uL (ref 1500–7800)
Neutrophils Relative %: 66 %
Platelets: 336 10*3/uL (ref 140–400)
RBC: 5.85 10*6/uL — ABNORMAL HIGH (ref 3.80–5.10)
RDW: 14.4 % (ref 11.0–15.0)
Total Lymphocyte: 24.6 %
WBC: 10.3 10*3/uL (ref 3.8–10.8)

## 2021-02-18 LAB — COMPLETE METABOLIC PANEL WITH GFR
AG Ratio: 1.8 (calc) (ref 1.0–2.5)
ALT: 18 U/L (ref 6–29)
AST: 14 U/L (ref 10–35)
Albumin: 4.3 g/dL (ref 3.6–5.1)
Alkaline phosphatase (APISO): 64 U/L (ref 37–153)
BUN: 12 mg/dL (ref 7–25)
CO2: 31 mmol/L (ref 20–32)
Calcium: 9.6 mg/dL (ref 8.6–10.4)
Chloride: 104 mmol/L (ref 98–110)
Creat: 0.86 mg/dL (ref 0.50–1.05)
Globulin: 2.4 g/dL (calc) (ref 1.9–3.7)
Glucose, Bld: 98 mg/dL (ref 65–99)
Potassium: 4.5 mmol/L (ref 3.5–5.3)
Sodium: 141 mmol/L (ref 135–146)
Total Bilirubin: 0.6 mg/dL (ref 0.2–1.2)
Total Protein: 6.7 g/dL (ref 6.1–8.1)
eGFR: 76 mL/min/{1.73_m2} (ref >=60)

## 2021-02-18 LAB — LIPID PANEL
Cholesterol: 188 mg/dL (ref <200)
HDL: 48 mg/dL — ABNORMAL LOW (ref >=50)
LDL Cholesterol (Calc): 116 mg/dL (calc) — ABNORMAL HIGH
Non-HDL Cholesterol (Calc): 140 mg/dL (calc) — ABNORMAL HIGH (ref <130)
Total CHOL/HDL Ratio: 3.9 (calc) (ref <5.0)
Triglycerides: 126 mg/dL (ref <150)

## 2021-02-18 LAB — HEMOGLOBIN A1C
Hgb A1c MFr Bld: 6.5 % of total Hgb — ABNORMAL HIGH (ref <5.7)
Mean Plasma Glucose: 140 mg/dL
eAG (mmol/L): 7.7 mmol/L

## 2021-02-23 ENCOUNTER — Encounter: Payer: Self-pay | Admitting: Medical-Surgical

## 2021-02-23 ENCOUNTER — Other Ambulatory Visit: Payer: Self-pay

## 2021-02-23 ENCOUNTER — Telehealth (INDEPENDENT_AMBULATORY_CARE_PROVIDER_SITE_OTHER): Payer: BC Managed Care – PPO | Admitting: Medical-Surgical

## 2021-02-23 DIAGNOSIS — E119 Type 2 diabetes mellitus without complications: Secondary | ICD-10-CM

## 2021-02-23 NOTE — Progress Notes (Signed)
Virtual Visit via Video Note  I connected with Jean Hopkins on 02/23/21 at  2:40 PM EDT by a video enabled telemedicine application and verified that I am speaking with the correct person using two identifiers.   I discussed the limitations of evaluation and management by telemedicine and the availability of in person appointments. The patient expressed understanding and agreed to proceed.  Patient location: home Provider locations: office  Subjective:    CC: discuss new DM diagnosis  HPI: Pleasant 65 year old female presenting today to discuss her new diagnosis of diabetes mellitus type 2.  Her A1c that was recently checked resulted at 6.5% indicating progression from prediabetes to full diabetes.  She is well versed in recommendations for care of diabetic patients and would like to work on diet and exercise to get it down.  She does not have a glucometer but had one in the past.  She would like to avoid other medications such as statins and ACE/ARB since she is really not a person who likes to take a lot of medications.  She has plans to work on dietary modification and start an exercise program for weight loss.  Would like to recheck her A1c in 3 months to see if we can get it down and if it is elevated at that time, may consider medication.  Past medical history, Surgical history, Family history not pertinant except as noted below, Social history, Allergies, and medications have been entered into the medical record, reviewed, and corrections made.   Review of Systems: See HPI for pertinent positives and negatives.   Objective:    General: Speaking clearly in complete sentences without any shortness of breath.  Alert and oriented x3.  Normal judgment. No apparent acute distress.  Impression and Recommendations:    1. Diabetes mellitus without complication ([place]) Hemoglobin A1c 6.5% indicating that there is no urgent need to start medication at this point.  She would like to avoid this so  we will allow her to work on lifestyle modifications and dietary restrictions.  Holding off on statin therapy and ACE/ARB for now per patient request.  Sending in a prescription for glucose that her insurance will cover.  Recommend checking her sugar fasting a couple of times a week to give her an idea of how her efforts are going.  I discussed the assessment and treatment plan with the patient. The patient was provided an opportunity to ask questions and all were answered. The patient agreed with the plan and demonstrated an understanding of the instructions.   The patient was advised to call back or seek an in-person evaluation if the symptoms worsen or if the condition fails to improve as anticipated.  15 minutes of non-face-to-face time was provided during this encounter.  Return in about 3 months (around 05/25/2021) for DM follow up.  Clearnce Sorrel, DNP, APRN, FNP-BC [facility]

## 2021-02-23 NOTE — Telephone Encounter (Signed)
Pt did have virtual visit with Joy.  Charyl Bigger, CMA

## 2021-04-05 ENCOUNTER — Other Ambulatory Visit (HOSPITAL_BASED_OUTPATIENT_CLINIC_OR_DEPARTMENT_OTHER): Payer: Self-pay | Admitting: Medical-Surgical

## 2021-04-05 DIAGNOSIS — Z1231 Encounter for screening mammogram for malignant neoplasm of breast: Secondary | ICD-10-CM

## 2021-05-04 DIAGNOSIS — D225 Melanocytic nevi of trunk: Secondary | ICD-10-CM | POA: Diagnosis not present

## 2021-05-04 DIAGNOSIS — Z85820 Personal history of malignant melanoma of skin: Secondary | ICD-10-CM | POA: Diagnosis not present

## 2021-05-04 DIAGNOSIS — L821 Other seborrheic keratosis: Secondary | ICD-10-CM | POA: Diagnosis not present

## 2021-05-04 DIAGNOSIS — I788 Other diseases of capillaries: Secondary | ICD-10-CM | POA: Diagnosis not present

## 2021-05-05 DIAGNOSIS — M170 Bilateral primary osteoarthritis of knee: Secondary | ICD-10-CM | POA: Diagnosis not present

## 2021-05-05 DIAGNOSIS — M1711 Unilateral primary osteoarthritis, right knee: Secondary | ICD-10-CM | POA: Diagnosis not present

## 2021-05-17 ENCOUNTER — Encounter (HOSPITAL_BASED_OUTPATIENT_CLINIC_OR_DEPARTMENT_OTHER): Payer: Self-pay

## 2021-05-17 ENCOUNTER — Other Ambulatory Visit: Payer: Self-pay

## 2021-05-17 ENCOUNTER — Ambulatory Visit (HOSPITAL_BASED_OUTPATIENT_CLINIC_OR_DEPARTMENT_OTHER)
Admission: RE | Admit: 2021-05-17 | Discharge: 2021-05-17 | Disposition: A | Discharge status: Home / Self Care | Payer: BC Managed Care – PPO | Source: Ambulatory Visit | Attending: Medical-Surgical | Admitting: Medical-Surgical

## 2021-05-17 DIAGNOSIS — Z1231 Encounter for screening mammogram for malignant neoplasm of breast: Secondary | ICD-10-CM | POA: Insufficient documentation

## 2021-07-05 ENCOUNTER — Ambulatory Visit: Payer: BC Managed Care – PPO | Attending: Orthopedic Surgery | Admitting: Physical Therapy

## 2021-07-05 ENCOUNTER — Encounter: Payer: Self-pay | Admitting: Physical Therapy

## 2021-07-05 ENCOUNTER — Other Ambulatory Visit: Payer: Self-pay

## 2021-07-05 DIAGNOSIS — M25661 Stiffness of right knee, not elsewhere classified: Secondary | ICD-10-CM | POA: Insufficient documentation

## 2021-07-05 DIAGNOSIS — R2689 Other abnormalities of gait and mobility: Secondary | ICD-10-CM | POA: Diagnosis not present

## 2021-07-05 DIAGNOSIS — M25561 Pain in right knee: Secondary | ICD-10-CM | POA: Insufficient documentation

## 2021-07-05 DIAGNOSIS — G8929 Other chronic pain: Secondary | ICD-10-CM | POA: Diagnosis not present

## 2021-07-05 DIAGNOSIS — M6281 Muscle weakness (generalized): Secondary | ICD-10-CM | POA: Insufficient documentation

## 2021-07-05 DIAGNOSIS — R262 Difficulty in walking, not elsewhere classified: Secondary | ICD-10-CM | POA: Insufficient documentation

## 2021-07-05 NOTE — Patient Instructions (Signed)
° ° °  Access Code: [email or web address] URL: [email or web address] Date: 07/05/2021 Prepared by: Annie Paras  Exercises Standing Gastroc Stretch at [employer] - 2-3 x daily - 7 x weekly - 3 reps - 30 sec hold Hooklying Hamstring Stretch with Strap - 2-3 x daily - 7 x weekly - 3 reps - 30 sec hold Supine ITB Stretch with Strap - 2-3 x daily - 7 x weekly - 3 reps - 30 sec hold Prone Quadriceps Stretch with Strap - 2-3 x daily - 7 x weekly - 3 reps - 30 sec hold Supine Quad Set - 1 x daily - 7 x weekly - 2 sets - 10 reps - 5 sec hold Active Straight Leg Raise with Quad Set - 1 x daily - 7 x weekly - 2 sets - 10 reps - 3-5 sec hold Standing March with Counter Support - 1 x daily - 7 x weekly - 2 sets - 10 reps - 2-3 sec hold Standing Hip Abduction with Counter Support - 1 x daily - 7 x weekly - 2 sets - 10 reps - 2-3 sec hold Standing Hip Extension with Counter Support - 1 x daily - 7 x weekly - 2 sets - 10 reps - 2-3 sec hold

## 2021-07-05 NOTE — Therapy (Signed)
[facility] [address]  [place], [place], [address] Phone: [phone]   Fax:  [phone]  Physical Therapy Evaluation  Patient Details  Name: Jean Hopkins MRN: [id number] Date of Birth: 64-10-1955 Referring Provider (PT): Victorino December, MD   Encounter Date: 64/03/2022   PT End of Session - 07/05/21 64     Visit Number 1    Number of Visits 12    Date for PT Re-Evaluation 08/16/21    Authorization Type BCBS    PT Start Time 1700    PT Stop Time 0626    PT Time Calculation (min) 51 min    Activity Tolerance Patient tolerated treatment well    Behavior During Therapy [facility] for tasks assessed/performed             Past Medical History:  Diagnosis Date   AF (atrial fibrillation) ([place])    Allergy    Anemia    Arthritis 11/2020   Right knee   Blood transfusion without reported diagnosis    Cancer ([place])    Melanoma x2   Cataract    Hyperthyroidism     Past Surgical History:  Procedure Laterality Date   ABDOMINAL HYSTERECTOMY  2007   CHOLECYSTECTOMY  1988    There were no vitals filed for this visit.    Subjective Assessment - 07/05/21 1702     Subjective Pt reports her MD was seeking insurance approval for gel injections for her knee but insurance wants her to try more conservative therapy first. She reports she has had 2 cortisone injections in the R knee but only temporary relief. Pain limits walking tolerance and stairs. She reports bone on bone in medial aspect of knee.    Limitations Walking;House hold activities    How long can you walk comfortably? pain onset within in a few steps    Patient Stated Goals "to be able to do some type of exercise to help with weight loss"    Currently in Pain? No/denies    Pain Score 0-No pain   3-4/10 on average, occasionally higher   Pain Location Knee    Pain Orientation Right;Anterior;Medial    Pain Descriptors / Indicators Sharp;Aching    Pain Type  Chronic pain    Pain Onset More than a month ago   6+ months   Pain Frequency Intermittent    Aggravating Factors  walking, esp up inclines & climbing stairs, sensitive to inclement weather    Pain Relieving Factors temp relief from injections; Celebrex helps    Effect of Pain on Daily Activities limited walking tolerance, difficulty climbing stairs                [facility] PT Assessment - 07/05/21 1700       Assessment   Medical Diagnosis R knee OA    Referring Provider (PT) Victorino December, MD    Onset Date/Surgical Date --   6+ months   Hand Dominance Left    Next MD Visit TBD    Prior Therapy none for current condition; h/o PT for L shoulder/scapular pain 2 cervical radiculitis      Precautions   Precautions None      Restrictions   Weight Bearing Restrictions No      Balance Screen   Has the patient fallen in the past 6 months No    Has the patient had a decrease in activity level because of a fear of falling?  Yes  Is the patient reluctant to leave their home because of a fear of falling?  No      Home Environment   Living Environment Private residence    Living Arrangements Spouse/significant other    Type of [place] Access Level entry    Home Layout One level    [place] None      Prior Function   Level of Independence Independent    Vocation Full time employment    Vocation Requirements mostly [place] walking until knee pain made her stop; doing things with her grandchildren, travel      Cognition   Overall Cognitive Status Within Functional Limits for tasks assessed      Observation/Other Assessments   Focus on Therapeutic Outcomes (FOTO)  Knee = 56; predicted D/C FS = 66      Sensation   Additional Comments mild neuropathy in her toes      Coordination   Gross Motor Movements are Fluid and Coordinated Yes      ROM / Strength   AROM / PROM / Strength AROM;PROM;Strength      AROM   AROM Assessment Site Knee     Right/Left Knee Right;Left    Right Knee Extension -17    Right Knee Flexion 103    Left Knee Extension 0    Left Knee Flexion 121      PROM   PROM Assessment Site Knee    Right/Left Knee Right    Right Knee Extension -4    Right Knee Flexion --      Strength   Strength Assessment Site Hip;Knee;Ankle    Right/Left Hip Right;Left    Right Hip Flexion 4-/5    Right Hip Extension 4-/5    Right Hip External Rotation  4-/5    Right Hip Internal Rotation 4/5    Right Hip ABduction 4-/5    Right Hip ADduction 3+/5    Left Hip Flexion 4+/5    Left Hip Extension 4/5    Left Hip External Rotation 4/5    Left Hip Internal Rotation 4+/5    Left Hip ABduction 4/5    Left Hip ADduction 4/5    Right/Left Knee Right;Left    Right Knee Flexion 4/5    Right Knee Extension 4/5    Left Knee Flexion 5/5    Left Knee Extension 5/5    Right/Left Ankle Right;Left    Right Ankle Dorsiflexion 4/5    Right Ankle Plantar Flexion 3/5   2 SLS heel raises with limited lift   Left Ankle Dorsiflexion 4+/5    Left Ankle Plantar Flexion 4/5   10 SLS heel raises     Flexibility   Soft Tissue Assessment /Muscle Length yes    Hamstrings mild/mod tight B    Quadriceps mod tight R>L    ITB mod tight R, mild/mod tight L    Piriformis mod tight B      Ambulation/Gait   Assistive device None    Gait Pattern Step-through pattern;Antalgic;Decreased weight shift to right;Decreased stance time - right;Decreased stride length;Right flexed knee in stance                        Objective measurements completed on examination: See above findings.       [facility] Adult PT Treatment/Exercise - 07/05/21 1700       Exercises   Exercises Knee/Hip  Knee/Hip Exercises: Stretches   Passive Hamstring Stretch Right;1 rep;30 seconds    Passive Hamstring Stretch Limitations hooklying with strap    Quad Stretch Right;1 rep;30 seconds    Quad Stretch Limitations prone with strap    ITB Stretch  Right;1 rep;30 seconds    ITB Stretch Limitations supine crossbody with strap    Gastroc Stretch Right;1 rep;30 seconds    Gastroc Stretch Limitations standing runner's stretch with strap      Knee/Hip Exercises: Standing   Hip Flexion Both;10 reps;Stengthening;Knee bent    Hip Abduction Right;10 reps;Stengthening;Knee straight    Hip Extension Right;10 reps;Stengthening;Knee straight      Knee/Hip Exercises: Supine   Quad Sets Right;10 reps;AROM;Strengthening    Quad Sets Limitations 5" hold    Straight Leg Raises Right;1 set;10 reps;AROM;Strengthening    Straight Leg Raises Limitations cues for quad set prior to lift                     PT Education - 07/05/21 1747     Education Details PT eval findings, anticipated POC and initial HEP - Access Code: [email or web address]    Person(s) Educated Patient    Methods Explanation;Demonstration;Verbal cues;Handout    Comprehension Verbalized understanding;Verbal cues required;Returned demonstration;Need further instruction              PT Short Term Goals - 07/05/21 1751       PT SHORT TERM GOAL #1   Title Patient will be independent with initial HEP    Status New    Target Date 07/19/21               PT Long Term Goals - 07/05/21 1751       PT LONG TERM GOAL #1   Title Patient will be independent with ongoing/advanced HEP for self-management at home    Status New    Target Date 08/16/21      PT LONG TERM GOAL #2   Title Patient to report reduction in frequency and intensity of R knee pain by >/= 50% to allow for improved activity tolerance    Status New    Target Date 08/16/21      PT LONG TERM GOAL #3   Title Patient will demonstrate R knee AROM to >/= 2-115 dg to allow for normal gait and stair mechanics    Status New    Target Date 08/16/21      PT LONG TERM GOAL #4   Title Patient will demonstrate improved B LE strength to >/= 4+/5 for improved stability and ease of mobility    Status New    Target  Date 08/16/21      PT LONG TERM GOAL #5   Title Patient will report improved walking tolerance sufficient to allow her to resume walking for exercise or attending community outings with her grandchildren    Status New    Target Date 08/16/21                    Plan - 07/05/21 1751     Clinical Impression Statement Onica is a 66 y/o female who presents to OP PT for chronic R knee pain secondary to OA. She reports she has been told she is bone-on-bone in the medial aspect of her knee. She has had limited relief from cortisone injections and her MD wanted to try gel injections, but insurance is requiring more conservative therapy first. She reports pain only minimal at rest but presents  almost immediately upon walking, limiting tolerance for walking and stairs (which she has to navigate 1 step at a time leading with her L LE on ascent and lowering with L LE on descent). Deficits include R knee pain, limited R knee AROM (17-103 with 4 of extension lacking with LE supported in supine), decreased proximal LE flexibility, R>L proximal LE weakness, and limited standing and gait tolerance with antalgic gait pattern. Maryann will benefit from skilled PT intervention to address the above listed deficits, reduce pain, and improve functional R knee ROM and strength to allow for improved knee stability for improved balance and gait tolerance to maximize function and safety with mobility in home and community.    Personal Factors and Comorbidities Age;Comorbidity 3+;Fitness;Past/Current Experience;Time since onset of injury/illness/exacerbation    Comorbidities A-fib, GERD, pre-diabetes, peripheral neuropathy, borderline HTN, Raynauds phenomenon    Examination-Activity Limitations Bend;Locomotion Level;Stairs;Transfers    Examination-Participation Restrictions Cleaning;Community Activity;Interpersonal Relationship;Occupation;Shop    Stability/Clinical Decision Making Stable/Uncomplicated    Clinical  Decision Making Low    Rehab Potential Good    PT Frequency 2x / week    PT Duration 6 weeks    PT Treatment/Interventions ADLs/Self Care Home Management;Cryotherapy;Electrical Stimulation;Iontophoresis 4mg /ml Dexamethasone;Moist Heat;DME Instruction;Gait training;Stair training;Functional mobility training;Therapeutic activities;Therapeutic exercise;Balance training;Neuromuscular re-education;Patient/family education;Manual techniques;Passive range of motion;Dry needling;Taping;Vasopneumatic Device;Joint Manipulations    PT Next Visit Plan Review initial HEP; R knee ROM; Core and B LE strengthening, MT and modalities PRN for pain and improved ROM    PT Home Exercise Plan Access Code: [email or web address] (1/10)    Consulted and Agree with Plan of Care Patient             Patient will benefit from skilled therapeutic intervention in order to improve the following deficits and impairments:  Abnormal gait, Decreased activity tolerance, Decreased balance, Decreased endurance, Decreased mobility, Decreased range of motion, Decreased strength, Difficulty walking, Increased fascial restricitons, Impaired flexibility, Impaired perceived functional ability, Pain  Visit Diagnosis: Chronic pain of right knee  Stiffness of right knee, not elsewhere classified  Muscle weakness (generalized)  Other abnormalities of gait and mobility  Difficulty in walking, not elsewhere classified     Problem List Patient Active Problem List   Diagnosis Date Noted   Hypertension 02/02/2021   Melanoma of skin ([place]) 02/02/2021   Raynaud's phenomenon 02/02/2021   Arthritis of right knee 01/14/2021   Gastroesophageal reflux disease without esophagitis 01/14/2021   Need for Tdap vaccination 01/14/2021   Osteoarthritis of right knee 01/04/2021   Hyperthyroidism    Atrial fibrillation ([place]) 07/29/2017    Percival Spanish, PT 07/05/2021, 6:33 PM  [facility] [address]  [place], [place], [address] Phone: [phone]   Fax:  [phone]  Name: APRILE DICKENSON MRN: [id number] Date of Birth: [birth year]

## 2021-07-07 ENCOUNTER — Other Ambulatory Visit: Payer: Self-pay

## 2021-07-07 ENCOUNTER — Ambulatory Visit: Payer: BC Managed Care – PPO

## 2021-07-07 DIAGNOSIS — M25661 Stiffness of right knee, not elsewhere classified: Secondary | ICD-10-CM

## 2021-07-07 DIAGNOSIS — G8929 Other chronic pain: Secondary | ICD-10-CM | POA: Diagnosis not present

## 2021-07-07 DIAGNOSIS — M25561 Pain in right knee: Secondary | ICD-10-CM | POA: Diagnosis not present

## 2021-07-07 DIAGNOSIS — R2689 Other abnormalities of gait and mobility: Secondary | ICD-10-CM | POA: Diagnosis not present

## 2021-07-07 DIAGNOSIS — R262 Difficulty in walking, not elsewhere classified: Secondary | ICD-10-CM | POA: Diagnosis not present

## 2021-07-07 DIAGNOSIS — M6281 Muscle weakness (generalized): Secondary | ICD-10-CM

## 2021-07-07 NOTE — Therapy (Signed)
[facility] [address]  [place], [place], [address] Phone: [phone]   Fax:  [phone]  Physical Therapy Treatment  Patient Details  Name: Jean Hopkins MRN: [id number] Date of Birth: 1956-04-12 Referring Provider (PT): Victorino December, MD   Encounter Date: 07/07/2021   PT End of Session - 07/07/21 3419     Visit Number 2    Number of Visits 12    Date for PT Re-Evaluation 08/16/21    Authorization Type BCBS    PT Start Time 1703    PT Stop Time 3790    PT Time Calculation (min) 42 min    Activity Tolerance Patient tolerated treatment well    Behavior During Therapy [facility] for tasks assessed/performed             Past Medical History:  Diagnosis Date   AF (atrial fibrillation) ([place])    Allergy    Anemia    Arthritis 11/2020   Right knee   Blood transfusion without reported diagnosis    Cancer ([place])    Melanoma x2   Cataract    Hyperthyroidism     Past Surgical History:  Procedure Laterality Date   ABDOMINAL HYSTERECTOMY  2007   CHOLECYSTECTOMY  1988    There were no vitals filed for this visit.   Subjective Assessment - 07/07/21 1706     Subjective Walking for longer distances and step aggrevate my knee.    Patient Stated Goals "to be able to do some type of exercise to help with weight loss"    Currently in Pain? Yes    Pain Score 1     Pain Location Knee    Pain Orientation Right;Anterior;Medial    Pain Descriptors / Indicators Sharp;Aching                               [facility] Adult PT Treatment/Exercise - 07/07/21 0001       Knee/Hip Exercises: Stretches   Passive Hamstring Stretch Right;1 rep;30 seconds    Passive Hamstring Stretch Limitations hooklying with strap    Quad Stretch Right;1 rep;30 seconds    Quad Stretch Limitations prone with strap    ITB Stretch Right;1 rep;30 seconds    ITB Stretch Limitations supine crossbody with strap      Knee/Hip  Exercises: Aerobic   Nustep L3x78min      Knee/Hip Exercises: Standing   Hip Flexion Both;10 reps;Stengthening;Knee bent    Hip Flexion Limitations YTB    Hip Abduction Right;10 reps;Stengthening;Knee straight    Abduction Limitations YTB    Hip Extension Right;10 reps;Stengthening;Knee straight    Extension Limitations YTB    Lateral Step Up Left;15 reps;Hand Hold: 1;Step Height: 2"    Lateral Step Up Limitations with eccentric step down    Step Down Left;5 reps;Hand Hold: 1;Step Height: 2"    Step Down Limitations eccentric step down    Functional Squat 10 reps    Functional Squat Limitations mini squats      Knee/Hip Exercises: Seated   Clamshell with [employer]   10x5"     Knee/Hip Exercises: Supine   Quad Sets Right;10 reps;AROM;Strengthening    Quad Sets Limitations 5 sec    Straight Leg Raises Strengthening;Right;10 reps    Straight Leg Raises Limitations with QS  PT Short Term Goals - 07/07/21 1755       PT SHORT TERM GOAL #1   Title Patient will be independent with initial HEP    Status On-going    Target Date 07/19/21               PT Long Term Goals - 07/07/21 1755       PT LONG TERM GOAL #1   Title Patient will be independent with ongoing/advanced HEP for self-management at home    Status On-going    Target Date 08/16/21      PT LONG TERM GOAL #2   Title Patient to report reduction in frequency and intensity of R knee pain by >/= 50% to allow for improved activity tolerance    Status On-going    Target Date 08/16/21      PT LONG TERM GOAL #3   Title Patient will demonstrate R knee AROM to >/= 2-115 dg to allow for normal gait and stair mechanics    Status On-going    Target Date 08/16/21      PT LONG TERM GOAL #4   Title Patient will demonstrate improved B LE strength to >/= 4+/5 for improved stability and ease of mobility    Status On-going    Target Date 08/16/21      PT LONG TERM GOAL #5   Title  Patient will report improved walking tolerance sufficient to allow her to resume walking for exercise or attending community outings with her grandchildren    Status On-going    Target Date 08/16/21                   Plan - 07/07/21 1754     Clinical Impression Statement Pt had reports of pain with step downs today but when we decreased the step height pain subsided. She was able to do squats with no pain and decreased ROM. Spent most of session reviewing HEP. Progressed HEP today and showed her how to use a belt for stretches at home. We were able to progress hip strengthening and WB exercises w/o any limitations.    Personal Factors and Comorbidities Age;Comorbidity 3+;Fitness;Past/Current Experience;Time since onset of injury/illness/exacerbation    Comorbidities A-fib, GERD, pre-diabetes, peripheral neuropathy, borderline HTN, Raynauds phenomenon    PT Frequency 2x / week    PT Duration 6 weeks    PT Treatment/Interventions ADLs/Self Care Home Management;Cryotherapy;Electrical Stimulation;Iontophoresis 4mg /ml Dexamethasone;Moist Heat;DME Instruction;Gait training;Stair training;Functional mobility training;Therapeutic activities;Therapeutic exercise;Balance training;Neuromuscular re-education;Patient/family education;Manual techniques;Passive range of motion;Dry needling;Taping;Vasopneumatic Device;Joint Manipulations    PT Next Visit Plan R knee ROM; Core and B LE strengthening, MT and modalities PRN for pain and improved ROM    PT Home Exercise Plan Access Code: [email or web address] (1/10)    Consulted and Agree with Plan of Care Patient             Patient will benefit from skilled therapeutic intervention in order to improve the following deficits and impairments:  Abnormal gait, Decreased activity tolerance, Decreased balance, Decreased endurance, Decreased mobility, Decreased range of motion, Decreased strength, Difficulty walking, Increased fascial restricitons, Impaired flexibility,  Impaired perceived functional ability, Pain  Visit Diagnosis: Chronic pain of right knee  Stiffness of right knee, not elsewhere classified  Muscle weakness (generalized)  Other abnormalities of gait and mobility  Difficulty in walking, not elsewhere classified     Problem List Patient Active Problem List   Diagnosis Date Noted   Hypertension 02/02/2021   Melanoma of skin ([place])  02/02/2021   Raynaud's phenomenon 02/02/2021   Arthritis of right knee 01/14/2021   Gastroesophageal reflux disease without esophagitis 01/14/2021   Need for Tdap vaccination 01/14/2021   Osteoarthritis of right knee 01/04/2021   Hyperthyroidism    Atrial fibrillation ([place]) 07/29/2017    Artist Pais, PTA 07/07/2021, 5:56 PM  [facility] [address]  [place], [place], [address] Phone: [phone]   Fax:  [phone]  Name: Jean Hopkins MRN: [id number] Date of Birth: 06-Dec-1955

## 2021-07-08 ENCOUNTER — Ambulatory Visit (INDEPENDENT_AMBULATORY_CARE_PROVIDER_SITE_OTHER): Payer: BC Managed Care – PPO | Admitting: Endocrinology

## 2021-07-08 VITALS — BP 156/76 | HR 83 | Ht 63.0 in | Wt 233.4 lb

## 2021-07-08 DIAGNOSIS — E0590 Thyrotoxicosis, unspecified without thyrotoxic crisis or storm: Secondary | ICD-10-CM

## 2021-07-08 LAB — T4, FREE: Free T4: 1.26 ng/dL (ref 0.60–1.60)

## 2021-07-08 LAB — TSH: TSH: 1.3 u[IU]/mL (ref 0.35–5.50)

## 2021-07-08 NOTE — Patient Instructions (Addendum)
Your blood pressure is high today.  Please see your primary care provider soon, to have it rechecked.   Blood tests are requested for you today.  We'll let you know about the results.   If ever you have fever while taking methimazole, stop it and call us, even if the reason is obvious, because of the risk of a rare side-effect.   It is best to never miss the medication.  However, if you do miss it, next best is to double up the next time.   Please come back for a follow-up appointment in 6 months.

## 2021-07-08 NOTE — Progress Notes (Signed)
Subjective:    Patient ID: Jean Hopkins, female    DOB: 28-Apr-1956, 66 y.o.   MRN: [id number]  HPI Pt returns for f/u of hyperthyroidism (dx'ed early 2019, in eval of AF; [place] in 2020 showed multinodular thyroid, with no thyroid nodule meeting criteria for bx or f/u US; tapazole was chosen as initial rx, due to AF; it was stopped in [place], due to hypothyroidism; since then, she has been euthyroid off rx until early 2020, when it was resumed for recurrent hyperthyroidism; she declines RAI).  No recent AF.  pt states she feels well in general.  Specifically, she denies palpitations and tremor.   Past Medical History:  Diagnosis Date   AF (atrial fibrillation) (HCC)    Allergy    Anemia    Arthritis 11/2020   Right knee   Blood transfusion without reported diagnosis    Cancer ([place])    Melanoma x2   Cataract    Hyperthyroidism     Past Surgical History:  Procedure Laterality Date   ABDOMINAL HYSTERECTOMY  2007   CHOLECYSTECTOMY  1988    Social History   Socioeconomic History   Marital status: Married    Spouse name: Not on file   Number of children: Not on file   Years of education: Not on file   Highest education level: Not on file  Occupational History   Not on file  Tobacco Use   Smoking status: Former    Packs/day: 0.50    Years: 1.00    Pack years: 0.50    Types: Cigarettes    Quit date: 10/24/1980    Years since quitting: 40.7   Smokeless tobacco: Never  Vaping Use   Vaping Use: Never used  Substance and Sexual Activity   Alcohol use: Yes    Comment: Once or twice a month   Drug use: No   Sexual activity: Yes    Birth control/protection: Surgical    Comment: hysterectomy  Other Topics Concern   Not on file  Social History Narrative   Not on file   Social Determinants of Health   Financial Resource Strain: Not on file  Food Insecurity: Not on file  Transportation Needs: Not on file  Physical Activity: Not on file  Stress: Not on file  Social  Connections: Not on file  Intimate Partner Violence: Not on file    Current Outpatient Medications on File Prior to Visit  Medication Sig Dispense Refill   celecoxib (CELEBREX) 100 MG capsule Take 100 mg by mouth 2 (two) times daily.     Cholecalciferol (VITAMIN D PO) Take 5,000 Units by mouth daily.     methimazole (TAPAZOLE) 5 MG tablet TAKE 1/2 TABLET BY MOUTH DAILY (Patient taking differently: Take 5 mg by mouth every other day.) 45 tablet 3   Omeprazole 20 MG TBEC Take 1 tablet by mouth daily.     No current facility-administered medications on file prior to visit.    No Known Allergies  Family History  Problem Relation Age of Onset   Hypertension Mother    Hyperlipidemia Mother    Multiple sclerosis Mother    Osteoporosis Mother    Hyperlipidemia Father    Hypertension Father    Heart disease Father    Hypertension Brother    Hypertension Brother    Hypertension Brother    Diverticulitis Brother    Stomach cancer Paternal Grandmother    Breast cancer Paternal Aunt    Polycystic ovary syndrome Child  Heart disease Brother    Hypertension Brother    Hypertension Brother    Thyroid disease Neg Hx    Colon cancer Neg Hx    Esophageal cancer Neg Hx    Rectal cancer Neg Hx     BP (!) 156/76    Pulse 83    Ht 5\' 3"  (1.6 m)    Wt 233 lb 6.4 oz (105.9 kg)    SpO2 98%    BMI 41.34 kg/m    Review of Systems Denies fever    Objective:   Physical Exam   Lab Results  Component Value Date   TSH 1.30 07/08/2021   T4TOTAL 11.6 07/29/2017      Assessment & Plan:  Hyperthyroidism: well-controlled.  Please continue the same methimazole

## 2021-07-11 ENCOUNTER — Ambulatory Visit: Payer: BC Managed Care – PPO | Admitting: Physical Therapy

## 2021-07-11 ENCOUNTER — Encounter: Payer: Self-pay | Admitting: Physical Therapy

## 2021-07-11 ENCOUNTER — Other Ambulatory Visit: Payer: Self-pay

## 2021-07-11 DIAGNOSIS — M6281 Muscle weakness (generalized): Secondary | ICD-10-CM | POA: Diagnosis not present

## 2021-07-11 DIAGNOSIS — M25661 Stiffness of right knee, not elsewhere classified: Secondary | ICD-10-CM | POA: Diagnosis not present

## 2021-07-11 DIAGNOSIS — R262 Difficulty in walking, not elsewhere classified: Secondary | ICD-10-CM | POA: Diagnosis not present

## 2021-07-11 DIAGNOSIS — M25561 Pain in right knee: Secondary | ICD-10-CM | POA: Diagnosis not present

## 2021-07-11 DIAGNOSIS — G8929 Other chronic pain: Secondary | ICD-10-CM

## 2021-07-11 DIAGNOSIS — R2689 Other abnormalities of gait and mobility: Secondary | ICD-10-CM | POA: Diagnosis not present

## 2021-07-11 NOTE — Patient Instructions (Signed)
° ° °  Access Code: [email or web address] URL: [email or web address] Date: 07/11/2021 Prepared by: Annie Paras  Exercises Standing Gastroc Stretch at [employer] - 2-3 x daily - 7 x weekly - 3 reps - 30 sec hold Hooklying Hamstring Stretch with Strap - 2-3 x daily - 7 x weekly - 3 reps - 30 sec hold Supine ITB Stretch with Strap - 2-3 x daily - 7 x weekly - 3 reps - 30 sec hold Supine Quad Set - 1 x daily - 7 x weekly - 2 sets - 10 reps - 5 sec hold Active Straight Leg Raise with Quad Set - 1 x daily - 7 x weekly - 2 sets - 10 reps - 3-5 sec hold Standing March with Counter Support - 1 x daily - 7 x weekly - 2 sets - 10 reps - 2-3 sec hold Standing Hip Abduction with Counter Support - 1 x daily - 7 x weekly - 2 sets - 10 reps - 2-3 sec hold Standing Hip Extension with Counter Support - 1 x daily - 7 x weekly - 2 sets - 10 reps - 2-3 sec hold Supine Quadriceps Stretch with Strap on Table - 2-3 x daily - 7 x weekly - 3 reps - 30 sec hold Quadricep Stretch with Chair and Counter Support - 2-3 x daily - 7 x weekly - 3 reps - 30 sec hold Seated Hip Adductor Stretch - 2-3 x daily - 7 x weekly - 3 reps - 30 sec hold

## 2021-07-11 NOTE — Therapy (Signed)
[facility] [address]  [place], [place], [address] Phone: [phone]   Fax:  [phone]  Physical Therapy Treatment  Patient Details  Name: Jean Hopkins MRN: [id number] Date of Birth: 05/17/1956 Referring Provider (PT): Victorino December, MD   Encounter Date: 07/11/2021   PT End of Session - 07/11/21 1657     Visit Number 3    Number of Visits 12    Date for PT Re-Evaluation 08/16/21    Authorization Type BCBS    PT Start Time 1657    PT Stop Time 1742    PT Time Calculation (min) 45 min    Activity Tolerance Patient tolerated treatment well    Behavior During Therapy [facility] for tasks assessed/performed             Past Medical History:  Diagnosis Date   AF (atrial fibrillation) ([place])    Allergy    Anemia    Arthritis 11/2020   Right knee   Blood transfusion without reported diagnosis    Cancer ([place])    Melanoma x2   Cataract    Hyperthyroidism     Past Surgical History:  Procedure Laterality Date   ABDOMINAL HYSTERECTOMY  2007   CHOLECYSTECTOMY  1988    There were no vitals filed for this visit.   Subjective Assessment - 07/11/21 1701     Subjective No issues with HEP including progression made last visit, other than difficulty reaching with belt for prone quad stretch.    Patient Stated Goals "to be able to do some type of exercise to help with weight loss"    Currently in Pain? Yes    Pain Score 1     Pain Location Knee    Pain Orientation Right;Anterior;Medial    Pain Descriptors / Indicators Sharp    Pain Type Chronic pain                               [facility] Adult PT Treatment/Exercise - 07/11/21 1657       Exercises   Exercises Knee/Hip      Knee/Hip Exercises: Stretches   Quad Stretch Right;2 reps;30 seconds    Quad Stretch Limitations standing with foot behind on chair & mod thomas with strap    Other Knee/Hip Stretches R/L seated hip adductor stretch 2 x  30 sec      Knee/Hip Exercises: Aerobic   Nustep L4 x 6 min (UE/LE)      Knee/Hip Exercises: Standing   Heel Raises Both;2 sets;10 reps;3 seconds    Heel Raises Limitations + alt single LE eccentric lowering    Functional Squat 10 reps    Functional Squat Limitations mini squats - cues for alignment emphasizing posterior weight shift avoiding knees fwd of toes      Knee/Hip Exercises: Seated   Long Arc Quad Right;Left;10 reps;Strengthening    Long Arc Quad Limitations red TB at ankles + hip ADD ball squeeze    Hamstring Curl Right;Left;10 reps;Strengthening    Hamstring Limitations red TB    Sit to Sand 10 reps;without UE support   + red TB hip ABD isometric     Knee/Hip Exercises: Supine   Short Arc Quad Sets Right;Left;10 reps;Strengthening    Short Arc Quad Sets Limitations + hooklying hip ADD ball squeeze    Hip Adduction Isometric Both;10 reps;Strengthening    Hip Adduction Isometric Limitations hooklying ball  squeeze    Bridges 10 reps;Strengthening    Bridges Limitations + red TB hip ABD isometric    Bridges with Ball Squeeze 10 reps;Strengthening    Other Supine Knee/Hip Exercises TrA + red TB alt hip ABD/ER 10 x 3"      Knee/Hip Exercises: Sidelying   Clams R/L red TB 10 x 3"                     PT Education - 07/11/21 1742     Education Details HEP modification/update - alternative quad stretches + hip adductor stretch - Access Code: [email or web address]    Person(s) Educated Patient    Methods Explanation;Demonstration;Verbal cues;Handout    Comprehension Verbalized understanding;Verbal cues required;Returned demonstration;Need further instruction              PT Short Term Goals - 07/07/21 1755       PT SHORT TERM GOAL #1   Title Patient will be independent with initial HEP    Status On-going    Target Date 07/19/21               PT Long Term Goals - 07/07/21 1755       PT LONG TERM GOAL #1   Title Patient will be independent with  ongoing/advanced HEP for self-management at home    Status On-going    Target Date 08/16/21      PT LONG TERM GOAL #2   Title Patient to report reduction in frequency and intensity of R knee pain by >/= 50% to allow for improved activity tolerance    Status On-going    Target Date 08/16/21      PT LONG TERM GOAL #3   Title Patient will demonstrate R knee AROM to >/= 2-115 dg to allow for normal gait and stair mechanics    Status On-going    Target Date 08/16/21      PT LONG TERM GOAL #4   Title Patient will demonstrate improved B LE strength to >/= 4+/5 for improved stability and ease of mobility    Status On-going    Target Date 08/16/21      PT LONG TERM GOAL #5   Title Patient will report improved walking tolerance sufficient to allow her to resume walking for exercise or attending community outings with her grandchildren    Status On-going    Target Date 08/16/21                   Plan - 07/11/21 1703     Clinical Impression Statement Jean Hopkins reports HEP going well other than difficulty reaching with belt for prone quad stretch - provided alternative options in standing or mod Thomas with strap. Progressed core and proximal LE strengthening targeting glutes as well as hip adductors and medial quads with good tolerance. Mild increased knee pain noted with R eccentric lowering during heel raises but not enough to need to discontinue exercise. Pt inquiring about how to manage muscle spasms/cramps at night (cramping typically in hip adductors and calves but most problematic in hip adductors) - discussed appropriate hydration as well as recommended stretching before bed with calf stretches reviewed and pt instructed in hip adductor stretch. HEP updated to reflect modifications and updates for stretching.    Comorbidities A-fib, GERD, pre-diabetes, peripheral neuropathy, borderline HTN, Raynauds phenomenon    Rehab Potential Good    PT Frequency 2x / week    PT Duration 6 weeks     PT  Treatment/Interventions ADLs/Self Care Home Management;Cryotherapy;Electrical Stimulation;Iontophoresis 4mg /ml Dexamethasone;Moist Heat;DME Instruction;Gait training;Stair training;Functional mobility training;Therapeutic activities;Therapeutic exercise;Balance training;Neuromuscular re-education;Patient/family education;Manual techniques;Passive range of motion;Dry needling;Taping;Vasopneumatic Device;Joint Manipulations    PT Next Visit Plan R knee ROM; Core and B LE strengthening, MT and modalities PRN for pain and improved ROM    PT Home Exercise Plan Access Code: [email or web address] (1/10)    Consulted and Agree with Plan of Care Patient             Patient will benefit from skilled therapeutic intervention in order to improve the following deficits and impairments:  Abnormal gait, Decreased activity tolerance, Decreased balance, Decreased endurance, Decreased mobility, Decreased range of motion, Decreased strength, Difficulty walking, Increased fascial restricitons, Impaired flexibility, Impaired perceived functional ability, Pain  Visit Diagnosis: Chronic pain of right knee  Stiffness of right knee, not elsewhere classified  Muscle weakness (generalized)  Other abnormalities of gait and mobility  Difficulty in walking, not elsewhere classified     Problem List Patient Active Problem List   Diagnosis Date Noted   Hypertension 02/02/2021   Melanoma of skin ([place]) 02/02/2021   Raynaud's phenomenon 02/02/2021   Arthritis of right knee 01/14/2021   Gastroesophageal reflux disease without esophagitis 01/14/2021   Need for Tdap vaccination 01/14/2021   Osteoarthritis of right knee 01/04/2021   Hyperthyroidism    Atrial fibrillation ([place]) 07/29/2017    Percival Spanish, PT 07/11/2021, 5:54 PM  [facility] [address]  [place], [place], [address] Phone: [phone]   Fax:  [phone]  Name: Jean Hopkins MRN:  [id number] Date of Birth: October 17, 1955

## 2021-07-13 ENCOUNTER — Other Ambulatory Visit: Payer: Self-pay

## 2021-07-13 ENCOUNTER — Ambulatory Visit: Payer: BC Managed Care – PPO

## 2021-07-13 DIAGNOSIS — M6281 Muscle weakness (generalized): Secondary | ICD-10-CM

## 2021-07-13 DIAGNOSIS — G8929 Other chronic pain: Secondary | ICD-10-CM

## 2021-07-13 DIAGNOSIS — R262 Difficulty in walking, not elsewhere classified: Secondary | ICD-10-CM | POA: Diagnosis not present

## 2021-07-13 DIAGNOSIS — M25661 Stiffness of right knee, not elsewhere classified: Secondary | ICD-10-CM | POA: Diagnosis not present

## 2021-07-13 DIAGNOSIS — R2689 Other abnormalities of gait and mobility: Secondary | ICD-10-CM

## 2021-07-13 DIAGNOSIS — M25561 Pain in right knee: Secondary | ICD-10-CM | POA: Diagnosis not present

## 2021-07-13 NOTE — Therapy (Signed)
[facility] [address]  [place], [place], [address] Phone: [phone]   Fax:  [phone]  Physical Therapy Treatment  Patient Details  Name: Jean Hopkins MRN: [id number] Date of Birth: 09-Aug-1955 Referring Provider (PT): Victorino December, MD   Encounter Date: 07/13/2021   PT End of Session - 07/13/21 1748     Visit Number 4    Number of Visits 12    Date for PT Re-Evaluation 08/16/21    Authorization Type BCBS    PT Start Time 1703    PT Stop Time 2637    PT Time Calculation (min) 42 min    Activity Tolerance Patient tolerated treatment well    Behavior During Therapy [facility] for tasks assessed/performed             Past Medical History:  Diagnosis Date   AF (atrial fibrillation) ([place])    Allergy    Anemia    Arthritis 11/2020   Right knee   Blood transfusion without reported diagnosis    Cancer ([place])    Melanoma x2   Cataract    Hyperthyroidism     Past Surgical History:  Procedure Laterality Date   ABDOMINAL HYSTERECTOMY  2007   CHOLECYSTECTOMY  1988    There were no vitals filed for this visit.   Subjective Assessment - 07/13/21 1705     Subjective Pt reports difficulty with steps and inclined surfaces, knee is a little stiff.    Patient Stated Goals "to be able to do some type of exercise to help with weight loss"    Currently in Pain? No/denies                               [facility] Adult PT Treatment/Exercise - 07/13/21 0001       Knee/Hip Exercises: Stretches   Sports administrator Right;Left;30 seconds    Sports administrator Limitations standing with foot behind on chair    Other Knee/Hip Stretches R/L seated hip adductor stretch x 30 sec      Knee/Hip Exercises: Aerobic   Nustep L5 x 6 min (UE/LE)      Knee/Hip Exercises: Machines for Strengthening   Cybex Knee Extension 10lb R LE only, 20lb B LE, x 10 reps each    Cybex Knee Flexion 10lb R LE only, 20lb B LE x 10 reps each       Knee/Hip Exercises: Supine   Bridges Strengthening;Both;15 reps    Bridges Limitations + red TB hip ABD isometric    Straight Leg Raises Strengthening;Both;10 reps    Straight Leg Raises Limitations with 1lb weight    Straight Leg Raise with External Rotation Strengthening;Both;10 reps    Straight Leg Raise with External Rotation Limitations with 1lb weight    Other Supine Knee/Hip Exercises supine marches with red TB 15 reps      Knee/Hip Exercises: Sidelying   Hip ABduction Strengthening;Right;10 reps    Clams R red TB 15 x 3"                       PT Short Term Goals - 07/07/21 1755       PT SHORT TERM GOAL #1   Title Patient will be independent with initial HEP    Status On-going    Target Date 07/19/21  PT Long Term Goals - 07/07/21 1755       PT LONG TERM GOAL #1   Title Patient will be independent with ongoing/advanced HEP for self-management at home    Status On-going    Target Date 08/16/21      PT LONG TERM GOAL #2   Title Patient to report reduction in frequency and intensity of R knee pain by >/= 50% to allow for improved activity tolerance    Status On-going    Target Date 08/16/21      PT LONG TERM GOAL #3   Title Patient will demonstrate R knee AROM to >/= 2-115 dg to allow for normal gait and stair mechanics    Status On-going    Target Date 08/16/21      PT LONG TERM GOAL #4   Title Patient will demonstrate improved B LE strength to >/= 4+/5 for improved stability and ease of mobility    Status On-going    Target Date 08/16/21      PT LONG TERM GOAL #5   Title Patient will report improved walking tolerance sufficient to allow her to resume walking for exercise or attending community outings with her grandchildren    Status On-going    Target Date 08/16/21                   Plan - 07/13/21 1748     Clinical Impression Statement Pt was w/o any problems with the interventions today. Focused on  proximal strengthening in NWB positions to avoid stressing the R knee too much and followed with some isolate knee strengthening. Reviewed the stretches given from last session and provided instruction as needed. She doesn't note as much cramping since she has been doing the stretches. She continues to note pain with steps and inclined surfaces.    Personal Factors and Comorbidities Age;Comorbidity 3+;Fitness;Past/Current Experience;Time since onset of injury/illness/exacerbation    Comorbidities A-fib, GERD, pre-diabetes, peripheral neuropathy, borderline HTN, Raynauds phenomenon    PT Frequency 2x / week    PT Duration 6 weeks    PT Treatment/Interventions ADLs/Self Care Home Management;Cryotherapy;Electrical Stimulation;Iontophoresis 4mg /ml Dexamethasone;Moist Heat;DME Instruction;Gait training;Stair training;Functional mobility training;Therapeutic activities;Therapeutic exercise;Balance training;Neuromuscular re-education;Patient/family education;Manual techniques;Passive range of motion;Dry needling;Taping;Vasopneumatic Device;Joint Manipulations    PT Next Visit Plan R knee ROM; Core and B LE strengthening, MT and modalities PRN for pain and improved ROM    PT Home Exercise Plan Access Code: [email or web address] (1/10)    Consulted and Agree with Plan of Care Patient             Patient will benefit from skilled therapeutic intervention in order to improve the following deficits and impairments:  Abnormal gait, Decreased activity tolerance, Decreased balance, Decreased endurance, Decreased mobility, Decreased range of motion, Decreased strength, Difficulty walking, Increased fascial restricitons, Impaired flexibility, Impaired perceived functional ability, Pain  Visit Diagnosis: Chronic pain of right knee  Stiffness of right knee, not elsewhere classified  Muscle weakness (generalized)  Other abnormalities of gait and mobility  Difficulty in walking, not elsewhere classified     Problem  List Patient Active Problem List   Diagnosis Date Noted   Hypertension 02/02/2021   Melanoma of skin ([place]) 02/02/2021   Raynaud's phenomenon 02/02/2021   Arthritis of right knee 01/14/2021   Gastroesophageal reflux disease without esophagitis 01/14/2021   Need for Tdap vaccination 01/14/2021   Osteoarthritis of right knee 01/04/2021   Hyperthyroidism    Atrial fibrillation ([place]) 07/29/2017    Sammy Douthitt Renaldo Harrison,  PTA 07/13/2021, 5:53 PM  [facility] [address]  [place], [place], [address] Phone: [phone]   Fax:  [phone]  Name: ALEESHA RINGSTAD MRN: [id number] Date of Birth: 10-10-1955

## 2021-07-18 ENCOUNTER — Ambulatory Visit: Payer: BC Managed Care – PPO

## 2021-07-18 ENCOUNTER — Other Ambulatory Visit: Payer: Self-pay

## 2021-07-18 DIAGNOSIS — M25561 Pain in right knee: Secondary | ICD-10-CM | POA: Diagnosis not present

## 2021-07-18 DIAGNOSIS — R262 Difficulty in walking, not elsewhere classified: Secondary | ICD-10-CM

## 2021-07-18 DIAGNOSIS — M6281 Muscle weakness (generalized): Secondary | ICD-10-CM

## 2021-07-18 DIAGNOSIS — G8929 Other chronic pain: Secondary | ICD-10-CM

## 2021-07-18 DIAGNOSIS — M25661 Stiffness of right knee, not elsewhere classified: Secondary | ICD-10-CM | POA: Diagnosis not present

## 2021-07-18 DIAGNOSIS — R2689 Other abnormalities of gait and mobility: Secondary | ICD-10-CM | POA: Diagnosis not present

## 2021-07-18 NOTE — Therapy (Signed)
[facility] [address]  [place], [place], [address] Phone: [phone]   Fax:  [phone]  Physical Therapy Treatment  Patient Details  Name: [name] MRN: [id number] Date of Birth: 11-11-1955 Referring Provider (PT): Victorino December, MD   Encounter Date: 07/18/2021   PT End of Session - 07/18/21 1011     Visit Number 5    Number of Visits 12    Date for PT Re-Evaluation 08/16/21    Authorization Type BCBS    PT Start Time 0933    PT Stop Time 1011    PT Time Calculation (min) 38 min    Activity Tolerance Patient tolerated treatment well    Behavior During Therapy [facility] for tasks assessed/performed             Past Medical History:  Diagnosis Date   AF (atrial fibrillation) ([place])    Allergy    Anemia    Arthritis 11/2020   Right knee   Blood transfusion without reported diagnosis    Cancer ([place])    Melanoma x2   Cataract    Hyperthyroidism     Past Surgical History:  Procedure Laterality Date   ABDOMINAL HYSTERECTOMY  2007   CHOLECYSTECTOMY  1988    There were no vitals filed for this visit.   Subjective Assessment - 07/18/21 0936     Subjective Pt reports that she has been moving into a new house, her knee is a little sore.    Patient Stated Goals "to be able to do some type of exercise to help with weight loss"    Currently in Pain? Yes    Pain Score 2     Pain Location Knee    Pain Orientation Right;Anterior;Medial    Pain Descriptors / Indicators Sharp    Pain Type Chronic pain                               [facility] Adult PT Treatment/Exercise - 07/18/21 0001       Knee/Hip Exercises: Aerobic   Recumbent Bike L1x25mn      Knee/Hip Exercises: Standing   Heel Raises Both;20 reps    Hip Flexion Stengthening;Both;10 reps;Knee bent    Hip Flexion Limitations RTB at knees, UE support    Hip Abduction Stengthening;Both;10 reps;Knee straight    Abduction  Limitations RTB at knees, UE support    Hip Extension Stengthening;Both;10 reps;Knee straight    Extension Limitations RTB at knees, UE support    Lateral Step Up Left;Hand Hold: 1;Step Height: 2";20 reps    Forward Step Up Both;10 reps;Step Height: 4"      Knee/Hip Exercises: Seated   Long Arc Quad Strengthening;Both;20 reps    Long Arc Quad Weight 2 lbs.    Long ACS[employer] CorporationLimitations 1st 10 reps with only weight, 2nd set wth ball squeeze    Marching Strengthening;Both;20 reps;Weights    Marching Limitations 2# cuff weight    Sit to Sand 20 reps;without UE support   2nd set with lowered seat                      PT Short Term Goals - 07/18/21 1007       PT SHORT TERM GOAL #1   Title Patient will be independent with initial HEP    Status Achieved   07/18/21   Target Date 07/19/21  PT Long Term Goals - 07/07/21 1755       PT LONG TERM GOAL #1   Title Patient will be independent with ongoing/advanced HEP for self-management at home    Status On-going    Target Date 08/16/21      PT LONG TERM GOAL #2   Title Patient to report reduction in frequency and intensity of R knee pain by >/= 50% to allow for improved activity tolerance    Status On-going    Target Date 08/16/21      PT LONG TERM GOAL #3   Title Patient will demonstrate R knee AROM to >/= 2-115 dg to allow for normal gait and stair mechanics    Status On-going    Target Date 08/16/21      PT LONG TERM GOAL #4   Title Patient will demonstrate improved B LE strength to >/= 4+/5 for improved stability and ease of mobility    Status On-going    Target Date 08/16/21      PT LONG TERM GOAL #5   Title Patient will report improved walking tolerance sufficient to allow her to resume walking for exercise or attending community outings with her grandchildren    Status On-going    Target Date 08/16/21                   Plan - 07/18/21 1011     Clinical Impression Statement Pt  seems to be doing well, she reported that she moved into a new house over the weekend and only experienced some soreness in her R knee after. Progressed fwd step ups today to 4' step. Progressed standing hip strengthening at home with red TB. Pt denies questions with HEP so STG met. She tolerated the progression of exercises well.    Personal Factors and Comorbidities Age;Comorbidity 3+;Fitness;Past/Current Experience;Time since onset of injury/illness/exacerbation    Comorbidities A-fib, GERD, pre-diabetes, peripheral neuropathy, borderline HTN, Raynauds phenomenon    PT Frequency 2x / week    PT Duration 6 weeks    PT Treatment/Interventions ADLs/Self Care Home Management;Cryotherapy;Electrical Stimulation;Iontophoresis 60m/ml Dexamethasone;Moist Heat;DME Instruction;Gait training;Stair training;Functional mobility training;Therapeutic activities;Therapeutic exercise;Balance training;Neuromuscular re-education;Patient/family education;Manual techniques;Passive range of motion;Dry needling;Taping;Vasopneumatic Device;Joint Manipulations    PT Next Visit Plan R knee ROM; Core and B LE strengthening, MT and modalities PRN for pain and improved ROM    PT Home Exercise Plan Access Code: [email or web address] (1/10)    Consulted and Agree with Plan of Care Patient             Patient will benefit from skilled therapeutic intervention in order to improve the following deficits and impairments:  Abnormal gait, Decreased activity tolerance, Decreased balance, Decreased endurance, Decreased mobility, Decreased range of motion, Decreased strength, Difficulty walking, Increased fascial restricitons, Impaired flexibility, Impaired perceived functional ability, Pain  Visit Diagnosis: Chronic pain of right knee  Stiffness of right knee, not elsewhere classified  Muscle weakness (generalized)  Other abnormalities of gait and mobility  Difficulty in walking, not elsewhere classified     Problem List Patient  Active Problem List   Diagnosis Date Noted   Hypertension 02/02/2021   [place] 02/02/2021   Raynaud's phenomenon 02/02/2021   Arthritis of right knee 01/14/2021   Gastroesophageal reflux disease without esophagitis 01/14/2021   Need for Tdap vaccination 01/14/2021   Osteoarthritis of right knee 01/04/2021   Hyperthyroidism    Atrial fibrillation ([place] 07/29/2017    BArtist Pais PTA 07/18/2021, 10:22 AM  [facility] [address]  [place], [place], [address] Phone: [phone]   Fax:  [phone]  Name: PAMLEA FINDER MRN: [id number] Date of Birth: 09/12/1955

## 2021-07-19 ENCOUNTER — Encounter: Payer: BC Managed Care – PPO | Admitting: Physical Therapy

## 2021-07-20 DIAGNOSIS — H25043 Posterior subcapsular polar age-related cataract, bilateral: Secondary | ICD-10-CM | POA: Diagnosis not present

## 2021-07-20 DIAGNOSIS — H524 Presbyopia: Secondary | ICD-10-CM | POA: Diagnosis not present

## 2021-07-20 DIAGNOSIS — H5213 Myopia, bilateral: Secondary | ICD-10-CM | POA: Diagnosis not present

## 2021-07-20 DIAGNOSIS — H2513 Age-related nuclear cataract, bilateral: Secondary | ICD-10-CM | POA: Diagnosis not present

## 2021-07-21 ENCOUNTER — Ambulatory Visit: Payer: BC Managed Care – PPO | Admitting: Physical Therapy

## 2021-07-21 ENCOUNTER — Other Ambulatory Visit: Payer: Self-pay

## 2021-07-21 ENCOUNTER — Encounter: Payer: Self-pay | Admitting: Physical Therapy

## 2021-07-21 DIAGNOSIS — G8929 Other chronic pain: Secondary | ICD-10-CM | POA: Diagnosis not present

## 2021-07-21 DIAGNOSIS — M6281 Muscle weakness (generalized): Secondary | ICD-10-CM

## 2021-07-21 DIAGNOSIS — R2689 Other abnormalities of gait and mobility: Secondary | ICD-10-CM

## 2021-07-21 DIAGNOSIS — R262 Difficulty in walking, not elsewhere classified: Secondary | ICD-10-CM | POA: Diagnosis not present

## 2021-07-21 DIAGNOSIS — M25661 Stiffness of right knee, not elsewhere classified: Secondary | ICD-10-CM

## 2021-07-21 DIAGNOSIS — M25561 Pain in right knee: Secondary | ICD-10-CM

## 2021-07-21 NOTE — Therapy (Signed)
[facility] [address]  [place], [place], [address] Phone: [phone]   Fax:  [phone]  Physical Therapy Treatment  Patient Details  Name: Jean Hopkins MRN: [id number] Date of Birth: 04/17/1956 Referring Provider (PT): Victorino December, MD   Encounter Date: 07/21/2021   PT End of Session - 07/21/21 1702     Visit Number 6    Number of Visits 12    Date for PT Re-Evaluation 08/16/21    Authorization Type BCBS    PT Start Time 1702    PT Stop Time 0354    PT Time Calculation (min) 42 min    Activity Tolerance Patient tolerated treatment well    Behavior During Therapy [facility] for tasks assessed/performed             Past Medical History:  Diagnosis Date   AF (atrial fibrillation) ([place])    Allergy    Anemia    Arthritis 11/2020   Right knee   Blood transfusion without reported diagnosis    Cancer ([place])    Melanoma x2   Cataract    Hyperthyroidism     Past Surgical History:  Procedure Laterality Date   ABDOMINAL HYSTERECTOMY  2007   CHOLECYSTECTOMY  1988    There were no vitals filed for this visit.   Subjective Assessment - 07/21/21 1706     Subjective Pt reports pain from being on her feet while moving is settling down and the stretches seem to have helped with the cramping she was having at night - no recent cramps.    Patient Stated Goals "to be able to do some type of exercise to help with weight loss"    Currently in Pain? No/denies                               [facility] Adult PT Treatment/Exercise - 07/21/21 1702       Knee/Hip Exercises: Aerobic   Recumbent Bike L2 x 6 min      Knee/Hip Exercises: Machines for Strengthening   Cybex Knee Extension 15# B con/R ecc 2 x 10    Cybex Knee Flexion 15# B con/R ecc 2 x 10    Cybex Leg Press 25# B LE 2 x 10    Total Gym Leg Press Ankle press 25# x 20      Knee/Hip Exercises: Standing   Terminal Knee Extension Right;20  reps;Strengthening;Theraband    Theraband Level (Terminal Knee Extension) Level 3 (Green)    Terminal Knee Extension Limitations cues for quad & glute activation as knee straightens    Lateral Step Up Right;10 reps;Step Height: 4";Hand Hold: 2    Forward Step Up Right;2 sets;10 reps;Step Height: 4";Step Height: 6";Hand Hold: 1    Forward Step Up Limitations + green TB TKE      Knee/Hip Exercises: Seated   Long Arc Quad Right;2 sets;10 reps;Strengthening;Weights    Long Arc Quad Weight 3 lbs.    Long Arc Quad Limitations + hip ADD ball squeeze    Clamshell with TheraBand Green   R 20 x 3"   Hamstring Curl Right;20 reps;Strengthening    Hamstring Limitations green TB                       PT Short Term Goals - 07/18/21 1007       PT SHORT TERM GOAL #1  Title Patient will be independent with initial HEP    Status Achieved   07/18/21   Target Date 07/19/21               PT Long Term Goals - 07/21/21 1740       PT LONG TERM GOAL #1   Title Patient will be independent with ongoing/advanced HEP for self-management at home    Status Partially Met    Target Date 08/16/21      PT LONG TERM GOAL #2   Title Patient to report reduction in frequency and intensity of R knee pain by >/= 50% to allow for improved activity tolerance    Status Partially Met    Target Date 08/16/21      PT LONG TERM GOAL #3   Title Patient will demonstrate R knee AROM to >/= 2-115 dg to allow for normal gait and stair mechanics    Status On-going    Target Date 08/16/21      PT LONG TERM GOAL #4   Title Patient will demonstrate improved B LE strength to >/= 4+/5 for improved stability and ease of mobility    Status On-going    Target Date 08/16/21      PT LONG TERM GOAL #5   Title Patient will report improved walking tolerance sufficient to allow her to resume walking for exercise or attending community outings with her grandchildren    Status On-going    Target Date 08/16/21                    Plan - 07/21/21 1709     Clinical Impression Statement Berniece reports the stretches seem to have resolved the cramping she was having at night. R knee pain has subsided since her move last weekend as she has settled back into more of her normal routine. Good tolerance for TE progression today with only mild pain noted with increased step-up height but improved with addition of green TB TKE. Tomorrow is progressing well toward her goals and will continue to benefit from skilled PT to further improve R knee ROM and core/LE strength to improve her activity tolerance and allow resumption of desired activities.    Comorbidities A-fib, GERD, pre-diabetes, peripheral neuropathy, borderline HTN, Raynauds phenomenon    Rehab Potential Good    PT Frequency 2x / week    PT Duration 6 weeks    PT Treatment/Interventions ADLs/Self Care Home Management;Cryotherapy;Electrical Stimulation;Iontophoresis 19m/ml Dexamethasone;Moist Heat;DME Instruction;Gait training;Stair training;Functional mobility training;Therapeutic activities;Therapeutic exercise;Balance training;Neuromuscular re-education;Patient/family education;Manual techniques;Passive range of motion;Dry needling;Taping;Vasopneumatic Device;Joint Manipulations    PT Next Visit Plan Reassess R knee ROM and LE strength; Progress R knee ROM, core and B LE strengthening, MT and modalities PRN for pain and improved ROM    PT Home Exercise Plan Access Code: [email or web address] (1/10, updated 1/16 & 1/23)    Consulted and Agree with Plan of Care Patient             Patient will benefit from skilled therapeutic intervention in order to improve the following deficits and impairments:  Abnormal gait, Decreased activity tolerance, Decreased balance, Decreased endurance, Decreased mobility, Decreased range of motion, Decreased strength, Difficulty walking, Increased fascial restricitons, Impaired flexibility, Impaired perceived functional ability,  Pain  Visit Diagnosis: Chronic pain of right knee  Stiffness of right knee, not elsewhere classified  Muscle weakness (generalized)  Other abnormalities of gait and mobility  Difficulty in walking, not elsewhere classified     Problem List Patient  Active Problem List   Diagnosis Date Noted   Hypertension 02/02/2021   Melanoma of skin ([place]) 02/02/2021   Raynaud's phenomenon 02/02/2021   Arthritis of right knee 01/14/2021   Gastroesophageal reflux disease without esophagitis 01/14/2021   Need for Tdap vaccination 01/14/2021   Osteoarthritis of right knee 01/04/2021   Hyperthyroidism    Atrial fibrillation ([place]) 07/29/2017    Percival Spanish, PT 07/21/2021, 6:06 PM  [facility] [address]  [place], [place], [address] Phone: [phone]   Fax:  [phone]  Name: JOELLEN TULLOS MRN: [id number] Date of Birth: 12-22-1955

## 2021-07-25 ENCOUNTER — Other Ambulatory Visit: Payer: Self-pay

## 2021-07-25 ENCOUNTER — Ambulatory Visit: Payer: BC Managed Care – PPO

## 2021-07-25 DIAGNOSIS — M25561 Pain in right knee: Secondary | ICD-10-CM | POA: Diagnosis not present

## 2021-07-25 DIAGNOSIS — R2689 Other abnormalities of gait and mobility: Secondary | ICD-10-CM | POA: Diagnosis not present

## 2021-07-25 DIAGNOSIS — G8929 Other chronic pain: Secondary | ICD-10-CM

## 2021-07-25 DIAGNOSIS — R262 Difficulty in walking, not elsewhere classified: Secondary | ICD-10-CM

## 2021-07-25 DIAGNOSIS — M6281 Muscle weakness (generalized): Secondary | ICD-10-CM | POA: Diagnosis not present

## 2021-07-25 DIAGNOSIS — M25661 Stiffness of right knee, not elsewhere classified: Secondary | ICD-10-CM | POA: Diagnosis not present

## 2021-07-25 NOTE — Therapy (Signed)
[facility] [address]  [place], [place], [address] Phone: [phone]   Fax:  [phone]  Physical Therapy Treatment  Patient Details  Name: Jean Hopkins MRN: [id number] Date of Birth: 10-29-1955 Referring Provider (PT): Victorino December, MD   Encounter Date: 07/25/2021   PT End of Session - 07/25/21 1751     Visit Number 7    Number of Visits 12    Date for PT Re-Evaluation 08/16/21    Authorization Type BCBS    PT Start Time 1703    PT Stop Time 5170    PT Time Calculation (min) 42 min    Activity Tolerance Patient tolerated treatment well    Behavior During Therapy [facility] for tasks assessed/performed             Past Medical History:  Diagnosis Date   AF (atrial fibrillation) ([place])    Allergy    Anemia    Arthritis 11/2020   Right knee   Blood transfusion without reported diagnosis    Cancer ([place])    Melanoma x2   Cataract    Hyperthyroidism     Past Surgical History:  Procedure Laterality Date   ABDOMINAL HYSTERECTOMY  2007   CHOLECYSTECTOMY  1988    There were no vitals filed for this visit.   Subjective Assessment - 07/25/21 1707     Subjective Pt reports her knees are doing good, noticing some improvements in pain levels with inclinces and steps.    Patient Stated Goals "to be able to do some type of exercise to help with weight loss"    Currently in Pain? No/denies                [facility] PT Assessment - 07/25/21 0001       AROM   Right Knee Extension 9    Right Knee Flexion 108      Strength   Right Hip Flexion 4+/5    Right Hip Extension 4/5    Right Hip External Rotation  4/5    Right Hip ABduction 4/5    Right Hip ADduction 4/5    Right Knee Flexion 4/5    Right Knee Extension 4/5                           [facility] Adult PT Treatment/Exercise - 07/25/21 0001       Knee/Hip Exercises: Aerobic   Nustep L6x64mn      Knee/Hip Exercises: Machines for  Strengthening   Cybex Knee Extension 20lb BLE 2x10, 10lb RLE 2x10    Cybex Knee Flexion 20lb B LE 2x10, 10lb R LE 2x10    Cybex Leg Press 25# B LE 2 x 10, 10LB R LE 2x10      Knee/Hip Exercises: Standing   Lateral Step Up Right;10 reps;Hand Hold: 1;Step Height: 4"    Forward Step Up Right;10 reps;Hand Hold: 0;Step Height: 4";2 sets    Other Standing Knee Exercises R pistol squats with TRX 15x                       PT Short Term Goals - 07/18/21 1007       PT SHORT TERM GOAL #1   Title Patient will be independent with initial HEP    Status Achieved   07/18/21   Target Date 07/19/21  PT Long Term Goals - 07/25/21 1801       PT LONG TERM GOAL #1   Title Patient will be independent with ongoing/advanced HEP for self-management at home    Status Partially Met    Target Date 08/16/21      PT LONG TERM GOAL #2   Title Patient to report reduction in frequency and intensity of R knee pain by >/= 50% to allow for improved activity tolerance    Status Partially Met    Target Date 08/16/21      PT LONG TERM GOAL #3   Title Patient will demonstrate R knee AROM to >/= 2-115 dg to allow for normal gait and stair mechanics    Status On-going   1/30- see flowsheet   Target Date 08/16/21      PT LONG TERM GOAL #4   Title Patient will demonstrate improved B LE strength to >/= 4+/5 for improved stability and ease of mobility    Status On-going   1/30- see flowsheet   Target Date 08/16/21      PT LONG TERM GOAL #5   Title Patient will report improved walking tolerance sufficient to allow her to resume walking for exercise or attending community outings with her grandchildren    Status On-going    Target Date 08/16/21                   Plan - 07/25/21 1751     Clinical Impression Statement Pt responded well to the progression of exercises. Pt had intial pain with fwd steps but after all other exercises pain subsided. Knee ROM is coming along but  still seeing some limitations, could maybe add heel slides and knee extension stretch to HEP. Strength test showed improvements overall but still grossly 4/5 in most muscle groups. She would continue to benefit from unilateral LE stabilization exercises to increase proxmial muscle recruitment to improve stability.    Personal Factors and Comorbidities Age;Comorbidity 3+;Fitness;Past/Current Experience;Time since onset of injury/illness/exacerbation    Comorbidities A-fib, GERD, pre-diabetes, peripheral neuropathy, borderline HTN, Raynauds phenomenon    PT Frequency 2x / week    PT Duration 6 weeks    PT Treatment/Interventions ADLs/Self Care Home Management;Cryotherapy;Electrical Stimulation;Iontophoresis 11m/ml Dexamethasone;Moist Heat;DME Instruction;Gait training;Stair training;Functional mobility training;Therapeutic activities;Therapeutic exercise;Balance training;Neuromuscular re-education;Patient/family education;Manual techniques;Passive range of motion;Dry needling;Taping;Vasopneumatic Device;Joint Manipulations    PT Next Visit Plan Progress R knee ROM, core and B LE strengthening, MT and modalities PRN for pain and improved ROM    PT Home Exercise Plan Access Code: [email or web address] (1/10, updated 1/16 & 1/23)    Consulted and Agree with Plan of Care Patient             Patient will benefit from skilled therapeutic intervention in order to improve the following deficits and impairments:  Abnormal gait, Decreased activity tolerance, Decreased balance, Decreased endurance, Decreased mobility, Decreased range of motion, Decreased strength, Difficulty walking, Increased fascial restricitons, Impaired flexibility, Impaired perceived functional ability, Pain  Visit Diagnosis: Chronic pain of right knee  Stiffness of right knee, not elsewhere classified  Muscle weakness (generalized)  Other abnormalities of gait and mobility  Difficulty in walking, not elsewhere classified     Problem  List Patient Active Problem List   Diagnosis Date Noted   Hypertension 02/02/2021   [place]anoma of skin ([place] 02/02/2021   Raynaud's phenomenon 02/02/2021   Arthritis of right knee 01/14/2021   Gastroesophageal reflux disease without esophagitis 01/14/2021   Need for Tdap vaccination 01/14/2021  Osteoarthritis of right knee 01/04/2021   Hyperthyroidism    Atrial fibrillation ([place]) 07/29/2017    Artist Pais, PTA 07/25/2021, 6:02 PM  [facility] [address]  [place], [place], [address] Phone: [phone]   Fax:  [phone]  Name: Jean Hopkins MRN: [id number] Date of Birth: May 19, 1956

## 2021-07-28 ENCOUNTER — Other Ambulatory Visit: Payer: Self-pay

## 2021-07-28 ENCOUNTER — Encounter: Payer: Self-pay | Admitting: Physical Therapy

## 2021-07-28 ENCOUNTER — Ambulatory Visit: Payer: BC Managed Care – PPO | Attending: Orthopedic Surgery | Admitting: Physical Therapy

## 2021-07-28 DIAGNOSIS — R2689 Other abnormalities of gait and mobility: Secondary | ICD-10-CM | POA: Diagnosis not present

## 2021-07-28 DIAGNOSIS — M25661 Stiffness of right knee, not elsewhere classified: Secondary | ICD-10-CM | POA: Insufficient documentation

## 2021-07-28 DIAGNOSIS — M6281 Muscle weakness (generalized): Secondary | ICD-10-CM | POA: Insufficient documentation

## 2021-07-28 DIAGNOSIS — M25561 Pain in right knee: Secondary | ICD-10-CM | POA: Insufficient documentation

## 2021-07-28 DIAGNOSIS — G8929 Other chronic pain: Secondary | ICD-10-CM | POA: Insufficient documentation

## 2021-07-28 DIAGNOSIS — R262 Difficulty in walking, not elsewhere classified: Secondary | ICD-10-CM | POA: Insufficient documentation

## 2021-07-28 NOTE — Patient Instructions (Signed)
Trigger Point Dry Needling  What is Trigger Point Dry Needling (DN)? DN is a physical therapy technique used to treat muscle pain and dysfunction. Specifically, DN helps deactivate muscle trigger points (muscle knots).  A thin filiform needle is used to penetrate the skin and stimulate the underlying trigger point. The goal is for a local twitch response (LTR) to occur and for the trigger point to relax. No medication of any kind is injected during the procedure.   What Does Trigger Point Dry Needling Feel Like?  The procedure feels different for each individual patient. Some patients report that they do not actually feel the needle enter the skin and overall the process is not painful. Very mild bleeding may occur. However, many patients feel a deep cramping in the muscle in which the needle was inserted. This is the local twitch response.   How Will I feel after the treatment? Soreness is normal, and the onset of soreness may not occur for a few hours. Typically this soreness does not last longer than two days.  Bruising is uncommon, however; ice can be used to decrease any possible bruising.  In rare cases feeling tired or nauseous after the treatment is normal. In addition, your symptoms may get worse before they get better, this period will typically not last longer than 24 hours.   What Can I do After My Treatment? Increase your hydration by drinking more water for the next 24 hours. You may place ice or heat on the areas treated that have become sore, however, do not use heat on inflamed or bruised areas. Heat often brings more relief post needling. You can continue your regular activities, but vigorous activity is not recommended initially after the treatment for 24 hours. DN is best combined with other physical therapy such as strengthening, stretching, and other therapies.      Kinesiology tape  What is kinesiology tape?  There are many brands of kinesiology tape. KTape, [employer], [employer], Dynamic tape, to name a few.  It is an elasticized tape designed to support the body's natural healing process. This tape provides stability and support to muscles and joints without restricting motion.  It can also help decrease swelling in the area of application.  How does it work?  The tape microscopically lifts and decompresses the skin to allow for drainage of lymph (swelling) to flow away from area, reducing inflammation. The tape has the ability to help re-educate the neuromuscular system by targeting specific receptors in the skin. The presence of the tape increases the body's awareness of posture and body mechanics.  Do not use with:  Open wounds Skin lesions Adhesive allergies  In some rare cases, mild/moderate skin irritation can occur. This can include redness, itchiness, or hives. If this occurs, immediately remove tape and consult your primary care physician if symptoms are severe or do not resolve within 2 days.  Safe removal of the tape:  To remove tape safely, hold nearby skin with one hand and gentle roll tape down with other hand. You can apply oil or conditioner to tape while in shower prior to removal to loosen adhesive. DO NOT swiftly rip tape off like a band-aid, as this could cause skin tears and additional skin irritation.     For questions, please contact your therapist at:  [facility] [address]  [place], [place], [address] Phone: [phone]   Fax:  [phone]

## 2021-07-28 NOTE — Therapy (Signed)
[facility] [address]  [place], [place], [address] Phone: [phone]   Fax:  [phone]  Physical Therapy Treatment  Patient Details  Name: Jean Hopkins MRN: [id number] Date of Birth: 08/25/1955 Referring Provider (PT): Victorino December, MD   Encounter Date: 07/28/2021   PT End of Session - 07/28/21 1701     Visit Number 8    Number of Visits 12    Date for PT Re-Evaluation 08/16/21    Authorization Type BCBS    PT Start Time 1701    PT Stop Time 2876    PT Time Calculation (min) 45 min    Activity Tolerance Patient tolerated treatment well    Behavior During Therapy [facility] for tasks assessed/performed             Past Medical History:  Diagnosis Date   AF (atrial fibrillation) ([place])    Allergy    Anemia    Arthritis 11/2020   Right knee   Blood transfusion without reported diagnosis    Cancer ([place])    Melanoma x2   Cataract    Hyperthyroidism     Past Surgical History:  Procedure Laterality Date   ABDOMINAL HYSTERECTOMY  2007   CHOLECYSTECTOMY  1988    There were no vitals filed for this visit.   Subjective Assessment - 07/28/21 1705     Subjective Pt reports onset on muscle spasms in back originating on Tuesday and culminating yesterday but still causing her a lot of discomfort. Unsure of trigger.    Patient Stated Goals "to be able to do some type of exercise to help with weight loss"    Currently in Pain? Yes    Pain Score 2     Pain Location Knee    Pain Orientation Right    Pain Descriptors / Indicators --   mild   Pain Type Chronic pain    Pain Frequency Intermittent    Pain Score 4   up to 8-9/10 when it spasms   Pain Location Back    Pain Orientation Lower;Left;Right   L>R   Pain Descriptors / Indicators Aching;Spasm    Pain Type Acute pain    Pain Onset In the past 7 days    Pain Frequency Intermittent    Aggravating Factors  moving, twisting, bending    Pain Relieving  Factors Tylenol, heated seat in car                               [facility] Adult PT Treatment/Exercise - 07/28/21 1701       Exercises   Exercises Knee/Hip      Lumbar Exercises: Stretches   Other Lumbar Stretch Exercise seated 3-way prayer stretch with green Pball 2 x 30 sec      Knee/Hip Exercises: Aerobic   Recumbent Bike L2 x 6 min      Manual Therapy   Manual Therapy Soft tissue mobilization;Myofascial release;Taping    Manual therapy comments skilled palpation and monitoring of soft tissue during DN    Soft tissue mobilization STM/DTM to B lumbar paraspinals    Myofascial Release pin and stretch to B lumbar paraspinals    Kinesiotex Inhibit Muscle      Kinesiotix   Inhibit Muscle  30-50% along lumbar paraspinals + 30-50% perpendicular strip at level of greatest discomfort   Kinesiotex Gold - sensitive skin tape  Trigger Point Dry Needling - 07/28/21 1701     Consent Given? Yes    Education Handout Provided Yes    Muscles Treated Back/Hip Erector spinae;Lumbar multifidi    Electrical Stimulation Performed with Dry Needling Yes    E-stim with Dry Needling Details B lumbar multifidi    Erector spinae Response Twitch response elicited;Palpable increased muscle length   R lumbar   Lumbar multifidi Response Twitch response elicited;Palpable increased muscle length                   PT Education - 07/28/21 1742     Education Details DN rational, procedure, outcomes, potential side effects, and recommended post-treatment exercises/activity; Role of Ktape including wearing and removal instructions    Person(s) Educated Patient    Methods Explanation;Handout    Comprehension Verbalized understanding              PT Short Term Goals - 07/18/21 1007       PT SHORT TERM GOAL #1   Title Patient will be independent with initial HEP    Status Achieved   07/18/21   Target Date 07/19/21               PT Long Term Goals -  07/25/21 1801       PT LONG TERM GOAL #1   Title Patient will be independent with ongoing/advanced HEP for self-management at home    Status Partially Met    Target Date 08/16/21      PT LONG TERM GOAL #2   Title Patient to report reduction in frequency and intensity of R knee pain by >/= 50% to allow for improved activity tolerance    Status Partially Met    Target Date 08/16/21      PT LONG TERM GOAL #3   Title Patient will demonstrate R knee AROM to >/= 2-115 dg to allow for normal gait and stair mechanics    Status On-going   1/30- see flowsheet   Target Date 08/16/21      PT LONG TERM GOAL #4   Title Patient will demonstrate improved B LE strength to >/= 4+/5 for improved stability and ease of mobility    Status On-going   1/30- see flowsheet   Target Date 08/16/21      PT LONG TERM GOAL #5   Title Patient will report improved walking tolerance sufficient to allow her to resume walking for exercise or attending community outings with her grandchildren    Status On-going    Target Date 08/16/21                   Plan - 07/28/21 1746     Clinical Impression Statement Jean Hopkins reports new onset lumbar muscle spasms that are so severe at times that they prevent her from moving or doing any exercises for her knee. Increased muscle tension/taut bands palpated with TTP in B lumbar paraspinals which appeared amenable to DN. After review of DN rational, procedures, outcomes and potential side effects, patient verbalized consent to DN treatment in conjunction with manual STM/DTM and TPR to reduce ttp/muscle tension. Muscles treated include B lumbar multifidi and R erector spinae, utilizing estim via needles for multifidi. DN produced normal response with good twitches elicited resulting in palpable reduction in pain/ttp and muscle tension. Pt reporting good relief of pain and improved activity tolerance and feels like she will be able to resume her knee HEP. Pt educated to expect  mild to  moderate muscle soreness for up to 24-48 hrs and instructed to continue prescribed HEP and current activity level with pt verbalizing understanding of theses instructions. Session concluded with application of sensitive skin Ktape to lumbar paraspinals to promote further muscle relaxation/relief from muscle spasm. Will plan to resume knee exercise progression next visit but if LBP/muscle spasms persist, will have pt request new PT orders to assess and treat low back more formally.    Comorbidities A-fib, GERD, pre-diabetes, peripheral neuropathy, borderline HTN, Raynauds phenomenon    PT Frequency 2x / week    PT Duration 6 weeks    PT Treatment/Interventions ADLs/Self Care Home Management;Cryotherapy;Electrical Stimulation;Iontophoresis 35m/ml Dexamethasone;Moist Heat;DME Instruction;Gait training;Stair training;Functional mobility training;Therapeutic activities;Therapeutic exercise;Balance training;Neuromuscular re-education;Patient/family education;Manual techniques;Passive range of motion;Dry needling;Taping;Vasopneumatic Device;Joint Manipulations    PT Next Visit Plan Progress R knee ROM, core and B LE strengthening, MT and modalities PRN for pain and improved ROM    PT Home Exercise Plan Access Code: [email or web address] (1/10, updated 1/16 & 1/23)    Consulted and Agree with Plan of Care Patient             Patient will benefit from skilled therapeutic intervention in order to improve the following deficits and impairments:  Abnormal gait, Decreased activity tolerance, Decreased balance, Decreased endurance, Decreased mobility, Decreased range of motion, Decreased strength, Difficulty walking, Increased fascial restricitons, Impaired flexibility, Impaired perceived functional ability, Pain  Visit Diagnosis: Chronic pain of right knee  Stiffness of right knee, not elsewhere classified  Muscle weakness (generalized)  Other abnormalities of gait and mobility  Difficulty in walking, not  elsewhere classified     Problem List Patient Active Problem List   Diagnosis Date Noted   Hypertension 02/02/2021   Melanoma of skin ([place] 02/02/2021   Raynaud's phenomenon 02/02/2021   Arthritis of right knee 01/14/2021   Gastroesophageal reflux disease without esophagitis 01/14/2021   Need for Tdap vaccination 01/14/2021   Osteoarthritis of right knee 01/04/2021   Hyperthyroidism    [place]rial fibrillation ([place] 07/29/2017    JPercival Spanish PT 07/28/2021, 6:19 PM  [facility] [address] [place] [address]Phone: [phone]  Fax:  [phone] Name: Jean FUELLINGMRN: [id number] of Birth: [birth year]

## 2021-08-01 ENCOUNTER — Other Ambulatory Visit: Payer: Self-pay

## 2021-08-01 ENCOUNTER — Ambulatory Visit: Payer: BC Managed Care – PPO

## 2021-08-01 DIAGNOSIS — M25661 Stiffness of right knee, not elsewhere classified: Secondary | ICD-10-CM | POA: Diagnosis not present

## 2021-08-01 DIAGNOSIS — R2689 Other abnormalities of gait and mobility: Secondary | ICD-10-CM

## 2021-08-01 DIAGNOSIS — M25561 Pain in right knee: Secondary | ICD-10-CM | POA: Diagnosis not present

## 2021-08-01 DIAGNOSIS — G8929 Other chronic pain: Secondary | ICD-10-CM

## 2021-08-01 DIAGNOSIS — M6281 Muscle weakness (generalized): Secondary | ICD-10-CM | POA: Diagnosis not present

## 2021-08-01 DIAGNOSIS — R262 Difficulty in walking, not elsewhere classified: Secondary | ICD-10-CM | POA: Diagnosis not present

## 2021-08-01 NOTE — Therapy (Signed)
[facility] [address]  [place], [place], [address] Phone: [phone]   Fax:  [phone]  Physical Therapy Treatment  Patient Details  Name: Jean Hopkins MRN: [id number] Date of Birth: June 06, 1956 Referring Provider (PT): Victorino December, MD   Encounter Date: 08/01/2021   PT End of Session - 08/01/21 1747     Visit Number 9    Number of Visits 12    Date for PT Re-Evaluation 08/16/21    Authorization Type BCBS    PT Start Time 1704    PT Stop Time 6962    PT Time Calculation (min) 38 min    Activity Tolerance Patient tolerated treatment well    Behavior During Therapy [facility] for tasks assessed/performed             Past Medical History:  Diagnosis Date   AF (atrial fibrillation) ([place])    Allergy    Anemia    Arthritis 11/2020   Right knee   Blood transfusion without reported diagnosis    Cancer ([place])    Melanoma x2   Cataract    Hyperthyroidism     Past Surgical History:  Procedure Laterality Date   ABDOMINAL HYSTERECTOMY  2007   CHOLECYSTECTOMY  1988    There were no vitals filed for this visit.   Subjective Assessment - 08/01/21 1708     Subjective Pt reports that the tape helped her back and DN, took it easy over the weekend due to my back, knees are stiff.    Patient Stated Goals "to be able to do some type of exercise to help with weight loss"    Currently in Pain? No/denies                [facility] PT Assessment - 08/01/21 0001       AROM   Right Knee Extension 5   seated   Right Knee Flexion 115   seated                          [facility] Adult PT Treatment/Exercise - 08/01/21 0001       Knee/Hip Exercises: Stretches   Active Hamstring Stretch Right;2 reps;30 seconds    Active Hamstring Stretch Limitations seated    Hip Flexor Stretch Right;2 reps;30 seconds    Hip Flexor Stretch Limitations with strap      Knee/Hip Exercises: Aerobic   Recumbent Bike L2 x 6  min      Knee/Hip Exercises: Machines for Strengthening   Cybex Knee Extension 25lb BLE 2x10, 15lb RLE 2x10    Cybex Knee Flexion 25lb B LE 2x10, 15lb R LE 2x10      Knee/Hip Exercises: Supine   Heel Slides AAROM;Both;10 reps   heels propped on peanut ball   Bridges Strengthening;Both;10 reps;2 sets   green TB hip ABD isometric   Other Supine Knee/Hip Exercises unilateral clamshells R/L 10x with GTB                       PT Short Term Goals - 07/18/21 1007       PT SHORT TERM GOAL #1   Title Patient will be independent with initial HEP    Status Achieved   07/18/21   Target Date 07/19/21               PT Long Term Goals - 08/01/21 1742  PT LONG TERM GOAL #1   Title Patient will be independent with ongoing/advanced HEP for self-management at home    Status Partially Met    Target Date 08/16/21      PT LONG TERM GOAL #2   Title Patient to report reduction in frequency and intensity of R knee pain by >/= 50% to allow for improved activity tolerance    Status Partially Met    Target Date 08/16/21      PT LONG TERM GOAL #3   Title Patient will demonstrate R knee AROM to >/= 2-115 dg to allow for normal gait and stair mechanics    Status Partially Met   2/6- see flowsheet   Target Date 08/16/21      PT LONG TERM GOAL #4   Title Patient will demonstrate improved B LE strength to >/= 4+/5 for improved stability and ease of mobility    Status On-going   1/30- see flowsheet   Target Date 08/16/21      PT LONG TERM GOAL #5   Title Patient will report improved walking tolerance sufficient to allow her to resume walking for exercise or attending community outings with her grandchildren    Status On-going    Target Date 08/16/21                   Plan - 08/01/21 1748     Clinical Impression Statement No reports of pain with any interventions today. Pt reported that her LBP from last visit has subsided. Avoided weight bearing exercises to reduce  strain on the lower back. With cuing and instruction pt showed a good demonstration of the exercises. Post session R knee ROM was 5-115 in sitting, she is making progress toward functional ROM of the R knee.    Personal Factors and Comorbidities Age;Comorbidity 3+;Fitness;Past/Current Experience;Time since onset of injury/illness/exacerbation    Comorbidities A-fib, GERD, pre-diabetes, peripheral neuropathy, borderline HTN, Raynauds phenomenon    PT Frequency 2x / week    PT Duration 6 weeks    PT Treatment/Interventions ADLs/Self Care Home Management;Cryotherapy;Electrical Stimulation;Iontophoresis 24m/ml Dexamethasone;Moist Heat;DME Instruction;Gait training;Stair training;Functional mobility training;Therapeutic activities;Therapeutic exercise;Balance training;Neuromuscular re-education;Patient/family education;Manual techniques;Passive range of motion;Dry needling;Taping;Vasopneumatic Device;Joint Manipulations    PT Next Visit Plan Progress R knee ROM, core and B LE strengthening, MT and modalities PRN for pain and improved ROM    PT Home Exercise Plan Access Code: [email or web address] (1/10, updated 1/16 & 1/23)    Consulted and Agree with Plan of Care Patient             Patient will benefit from skilled therapeutic intervention in order to improve the following deficits and impairments:  Abnormal gait, Decreased activity tolerance, Decreased balance, Decreased endurance, Decreased mobility, Decreased range of motion, Decreased strength, Difficulty walking, Increased fascial restricitons, Impaired flexibility, Impaired perceived functional ability, Pain  Visit Diagnosis: Chronic pain of right knee  Stiffness of right knee, not elsewhere classified  Muscle weakness (generalized)  Other abnormalities of gait and mobility  Difficulty in walking, not elsewhere classified     Problem List Patient Active Problem List   Diagnosis Date Noted   Hypertension 02/02/2021   Melanoma of skin ([place]  02/02/2021   Raynaud's phenomenon 02/02/2021   Arthritis of right knee 01/14/2021   Gastroesophageal reflux disease without esophagitis 01/14/2021   Need for Tdap vaccination 01/14/2021   Osteoarthritis of right knee 01/04/2021   Hyperthyroidism    Atrial fibrillation ([place] 07/29/2017    BArtist Pais PTA 08/01/2021,  5:53 PM  [facility] [address]  [place], [place], [address] Phone: [phone]   Fax:  [phone]  Name: Jean Hopkins MRN: [id number] Date of Birth: 1955-06-30

## 2021-08-04 ENCOUNTER — Encounter: Payer: BC Managed Care – PPO | Admitting: Physical Therapy

## 2021-08-08 ENCOUNTER — Ambulatory Visit: Payer: BC Managed Care – PPO | Admitting: Physical Therapy

## 2021-08-10 ENCOUNTER — Other Ambulatory Visit: Payer: Self-pay

## 2021-08-10 ENCOUNTER — Emergency Department (INDEPENDENT_AMBULATORY_CARE_PROVIDER_SITE_OTHER)
Admission: RE | Admit: 2021-08-10 | Discharge: 2021-08-10 | Disposition: A | Discharge status: Home / Self Care | Payer: BC Managed Care – PPO | Source: Ambulatory Visit

## 2021-08-10 VITALS — BP 138/86 | HR 102 | Temp 99.2°F | Resp 16 | Ht 63.0 in | Wt 228.0 lb

## 2021-08-10 DIAGNOSIS — R059 Cough, unspecified: Secondary | ICD-10-CM | POA: Diagnosis not present

## 2021-08-10 DIAGNOSIS — J101 Influenza due to other identified influenza virus with other respiratory manifestations: Secondary | ICD-10-CM

## 2021-08-10 LAB — POCT INFLUENZA A/B
Influenza A, POC: NEGATIVE
Influenza B, POC: POSITIVE — AB

## 2021-08-10 MED ORDER — BENZONATATE 200 MG PO CAPS: 200.0000 mg | ORAL_CAPSULE | Freq: Three times a day (TID) | ORAL | 0 refills | Status: AC | PRN

## 2021-08-10 MED ORDER — PREDNISONE 20 MG PO TABS: ORAL_TABLET | ORAL | 0 refills | Status: DC

## 2021-08-10 MED ORDER — OSELTAMIVIR PHOSPHATE 75 MG PO CAPS: 75.0000 mg | ORAL_CAPSULE | Freq: Two times a day (BID) | ORAL | 0 refills | Status: DC

## 2021-08-10 NOTE — ED Triage Notes (Signed)
Cough & congestion since Monday  Runny nose  OTC - tylenol  No cough or cold meds Chest congestion  Will need a work note  Negative COVID test on Monday & Tuesday

## 2021-08-10 NOTE — Discharge Instructions (Addendum)
Advised patient to take medications as directed with food to completion.  Advised patient may use Tessalon Perles daily or as needed for cough.  Encouraged patient to increase daily water intake while taking these medications.

## 2021-08-10 NOTE — ED Provider Notes (Signed)
[name]    CSN: [id number] Arrival date & time: 08/10/21  1016      History   Chief Complaint Chief Complaint  Patient presents with   Cough    HPI Jean Hopkins is a 66 y.o. female.   HPI 66 year old female presents with cough and congestion since Monday.  Patient reports negative home COVID-19 test on Monday and Tuesday and will need a work note today.  PMH significant for A-fib and cancer.  Past Medical History:  Diagnosis Date   AF (atrial fibrillation) ([place])    Allergy    Anemia    Arthritis 11/2020   Right knee   Blood transfusion without reported diagnosis    Cancer [facility])    Melanoma x2   Cataract    Hyperthyroidism     Patient Active Problem List   Diagnosis Date Noted   Hypertension 02/02/2021   Melanoma of skin ([place]) 02/02/2021   Raynaud's phenomenon 02/02/2021   Arthritis of right knee 01/14/2021   Gastroesophageal reflux disease without esophagitis 01/14/2021   Need for Tdap vaccination 01/14/2021   Osteoarthritis of right knee 01/04/2021   Hyperthyroidism    Atrial fibrillation ([place]) 07/29/2017    Past Surgical History:  Procedure Laterality Date   ABDOMINAL HYSTERECTOMY  2007   CHOLECYSTECTOMY  1988    OB History   No obstetric history on file.      Home Medications    Prior to Admission medications   Medication Sig Start Date End Date Taking? Authorizing Provider  benzonatate (TESSALON) 200 MG capsule Take 1 capsule (200 mg total) by mouth 3 (three) times daily as needed for up to 7 days for cough. 08/10/21 08/17/21 Yes Eliezer Lofts, FNP  oseltamivir (TAMIFLU) 75 MG capsule Take 1 capsule (75 mg total) by mouth every 12 (twelve) hours. 08/10/21  Yes Eliezer Lofts, FNP  predniSONE (DELTASONE) 20 MG tablet Take 3 tabs PO daily x 5 days. 08/10/21  Yes Eliezer Lofts, FNP  celecoxib (CELEBREX) 100 MG capsule Take 100 mg by mouth 2 (two) times daily.    [provider]  Cholecalciferol (VITAMIN D PO) Take 5,000 Units  by mouth daily.    [provider]  methimazole (TAPAZOLE) 5 MG tablet TAKE 1/2 TABLET BY MOUTH DAILY Patient taking differently: Take 5 mg by mouth every other day. 10/04/20   Renato Shin, MD  Omeprazole 20 MG TBEC Take 1 tablet by mouth daily.    [provider]    Family History Family History  Problem Relation Age of Onset   Hypertension Mother    Hyperlipidemia Mother    Multiple sclerosis Mother    Osteoporosis Mother    Hyperlipidemia Father    Hypertension Father    Heart disease Father    Hypertension Brother    Hypertension Brother    Hypertension Brother    Diverticulitis Brother    Stomach cancer Paternal Grandmother    Breast cancer Paternal Aunt    Polycystic ovary syndrome Child    Heart disease Brother    Hypertension Brother    Hypertension Brother    Thyroid disease Neg Hx    Colon cancer Neg Hx    Esophageal cancer Neg Hx    Rectal cancer Neg Hx     Social History Social History   Tobacco Use   Smoking status: Former    Packs/day: 0.50    Years: 1.00    Pack years: 0.50    Types: Cigarettes  Quit date: 10/24/1980    Years since quitting: 40.8   Smokeless tobacco: Never  Vaping Use   Vaping Use: Never used  Substance Use Topics   Alcohol use: Yes    Comment: Once or twice a month   Drug use: No     Allergies   Patient has no known allergies.   Review of Systems Review of Systems  HENT:  Positive for congestion.   Respiratory:  Positive for cough.   All other systems reviewed and are negative.   Physical Exam Triage Vital Signs ED Triage Vitals  Enc Vitals Group     BP 08/10/21 1030 138/86     Pulse Rate 08/10/21 1030 (!) 102     Resp 08/10/21 1030 16     Temp 08/10/21 1030 99.2 F (37.3 C)     Temp src --      SpO2 08/10/21 1030 95 %     Weight 08/10/21 1031 228 lb (103.4 kg)     Height 08/10/21 1031 5\' 3"  (1.6 m)     Head Circumference --      Peak Flow --      Pain Score 08/10/21 1031 2     Pain Loc  --      Pain Edu? --      Excl. in [place]? --    No data found.  Updated Vital Signs BP 138/86 (BP Location: Left Arm)    Pulse (!) 102    Temp 99.2 F (37.3 C)    Resp 16    Ht 5\' 3"  (1.6 m)    Wt 228 lb (103.4 kg)    SpO2 95%    BMI 40.39 kg/m    Physical Exam Vitals and nursing note reviewed.  Constitutional:      General: She is not in acute distress.    Appearance: Normal appearance. She is obese. She is ill-appearing.  HENT:     Head: Normocephalic and atraumatic.     Right Ear: Tympanic membrane, ear canal and external ear normal.     Left Ear: Tympanic membrane, ear canal and external ear normal.     Mouth/Throat:     Mouth: Mucous membranes are moist.     Pharynx: Oropharynx is clear.  Eyes:     Extraocular Movements: Extraocular movements intact.     Conjunctiva/sclera: Conjunctivae normal.     Pupils: Pupils are equal, round, and reactive to light.  Cardiovascular:     Rate and Rhythm: Normal rate and regular rhythm.     Pulses: Normal pulses.     Heart sounds: Normal heart sounds.  Pulmonary:     Effort: Pulmonary effort is normal.     Breath sounds: Normal breath sounds. No wheezing, rhonchi or rales.     Comments: Infrequent nonproductive cough noted on exam Musculoskeletal:     Cervical back: Normal range of motion and neck supple.  Skin:    General: Skin is warm and dry.  Neurological:     General: No focal deficit present.     Mental Status: She is alert and oriented to person, place, and time.     UC Treatments / Results  Labs (all labs ordered are listed, but only abnormal results are displayed) Labs Reviewed  POCT INFLUENZA A/B - Abnormal; Notable for the following components:      Result Value   Influenza B, POC Positive (*)    All other components within normal limits    EKG   Radiology  No results found.  Procedures Procedures (including critical care time)  Medications Ordered in UC Medications - No data to display  Initial  Impression / Assessment and Plan / UC Course  I have reviewed the triage vital signs and the nursing notes.  Pertinent labs & imaging results that were available during my care of the patient were reviewed by me and considered in my medical decision making (see chart for details).     MDM: 1.  Influenza B-Rx'd Tamiflu; 2.  Cough-Rx'd prednisone and Tessalon Perles. Advised patient to take medications as directed with food to completion.  Advised patient may use Tessalon Perles daily or as needed for cough.  Encouraged patient to increase daily water intake while taking these medications.  Work note provided per patient request prior to discharge today.  Patient discharged home, hemodynamically stable. Final Clinical Impressions(s) / UC Diagnoses   Final diagnoses:  Influenza B  Cough, unspecified type     Discharge Instructions      Advised patient to take medications as directed with food to completion.  Advised patient may use Tessalon Perles daily or as needed for cough.  Encouraged patient to increase daily water intake while taking these medications.     ED Prescriptions     Medication Sig Dispense Auth. Provider   oseltamivir (TAMIFLU) 75 MG capsule Take 1 capsule (75 mg total) by mouth every 12 (twelve) hours. 10 capsule Eliezer Lofts, FNP   predniSONE (DELTASONE) 20 MG tablet Take 3 tabs PO daily x 5 days. 15 tablet Eliezer Lofts, FNP   benzonatate (TESSALON) 200 MG capsule Take 1 capsule (200 mg total) by mouth 3 (three) times daily as needed for up to 7 days for cough. 40 capsule Eliezer Lofts, FNP      PDMP not reviewed this encounter.   Eliezer Lofts, [place] 08/10/21 1141

## 2021-08-16 ENCOUNTER — Encounter: Payer: Self-pay | Admitting: Physical Therapy

## 2021-08-16 ENCOUNTER — Other Ambulatory Visit: Payer: Self-pay

## 2021-08-16 ENCOUNTER — Ambulatory Visit: Payer: BC Managed Care – PPO | Admitting: Physical Therapy

## 2021-08-16 DIAGNOSIS — M6281 Muscle weakness (generalized): Secondary | ICD-10-CM | POA: Diagnosis not present

## 2021-08-16 DIAGNOSIS — M25561 Pain in right knee: Secondary | ICD-10-CM | POA: Diagnosis not present

## 2021-08-16 DIAGNOSIS — M25661 Stiffness of right knee, not elsewhere classified: Secondary | ICD-10-CM | POA: Diagnosis not present

## 2021-08-16 DIAGNOSIS — R2689 Other abnormalities of gait and mobility: Secondary | ICD-10-CM | POA: Diagnosis not present

## 2021-08-16 DIAGNOSIS — G8929 Other chronic pain: Secondary | ICD-10-CM | POA: Diagnosis not present

## 2021-08-16 DIAGNOSIS — R262 Difficulty in walking, not elsewhere classified: Secondary | ICD-10-CM | POA: Diagnosis not present

## 2021-08-16 NOTE — Patient Instructions (Signed)
° ° ° °  Access Code: [email or web address] URL: [email or web address] Date: 08/16/2021 Prepared by: Annie Paras  Exercises Standing Gastroc Stretch at Counter - 2-3 x daily - 7 x weekly - 3 reps - 30 sec hold Hooklying Hamstring Stretch with Strap - 2-3 x daily - 7 x weekly - 3 reps - 30 sec hold Supine ITB Stretch with Strap - 2-3 x daily - 7 x weekly - 3 reps - 30 sec hold Supine Quad Set - 1 x daily - 7 x weekly - 2 sets - 10 reps - 5 sec hold Active Straight Leg Raise with Quad Set - 1 x daily - 7 x weekly - 2 sets - 10 reps - 3-5 sec hold Standing March with Counter Support - 1 x daily - 7 x weekly - 2 sets - 10 reps - 2-3 sec hold Supine Quadriceps Stretch with Strap on Table - 2-3 x daily - 7 x weekly - 3 reps - 30 sec hold Quadricep Stretch with Chair and Counter Support - 2-3 x daily - 7 x weekly - 3 reps - 30 sec hold Seated Hip Adductor Stretch - 2-3 x daily - 7 x weekly - 3 reps - 30 sec hold Standing Hip Flexion with Anchored Resistance and Chair Support - 1 x daily - 4 x weekly - 2 sets - 10 reps - 3 sec hold Standing Hip Adduction with Anchored Resistance - 1 x daily - 4 x weekly - 2 sets - 10 reps - 3 sec hold Standing Hip Extension with Anchored Resistance - 1 x daily - 4 x weekly - 2 sets - 10 reps - 3 sec hold Standing Hip Abduction with Anchored Resistance - 1 x daily - 4 x weekly - 2 sets - 10 reps - 3 sec hold Standing Terminal Knee Extension with Resistance - 1 x daily - 4 x weekly - 2 sets - 10 reps - 5 sec hold  Patient Education Trigger Point Dry Needling Kinesiology tape

## 2021-08-16 NOTE — Therapy (Signed)
[facility] [place] [address]  [place], [place], [address] Phone: [phone]   Fax:  [phone]  Physical Therapy Treatment / Recert  Patient Details  Name: Jean Hopkins MRN: [id number] Date of Birth: [birth year] Referring Provider (PT): Victorino December, MD  Progress Note  Reporting Period 07/05/2021 to 08/16/2021  See note below for Objective Data and Assessment of Progress/Goals.     Encounter Date: 08/16/2021   PT End of Session - 08/16/21 1658     Visit Number 10    Number of Visits 18    Date for PT Re-Evaluation 09/13/21    Authorization Type BCBS    PT Start Time 1658    PT Stop Time 7169    PT Time Calculation (min) 48 min    Activity Tolerance Patient tolerated treatment well    Behavior During Therapy WFL for tasks assessed/performed             Past Medical History:  Diagnosis Date   AF (atrial fibrillation) ([place])    Allergy    Anemia    Arthritis 11/2020   Right knee   Blood transfusion without reported diagnosis    Cancer ([place])    Melanoma x2   Cataract    Hyperthyroidism     Past Surgical History:  Procedure Laterality Date   ABDOMINAL HYSTERECTOMY  2007   CHOLECYSTECTOMY  1988    There were no vitals filed for this visit.   Subjective Assessment - 08/16/21 1701     Subjective Pt returning to PT for the first time in 2 weeks due to be ill with the flu. Still not feeling fully back to normal energy-wise.    Patient Stated Goals "to be able to do some type of exercise to help with weight loss"    Currently in Pain? No/denies                [facility] PT Assessment - 08/16/21 1658       Assessment   Medical Diagnosis R knee OA    Referring Provider (PT) Victorino December, MD    Onset Date/Surgical Date --   6+ months   Next MD Visit TBD      Prior Function   Level of Independence Independent    Vocation Full time employment    Vocation Requirements mostly deskwork    Leisure  walking until knee pain made her stop; doing things with her grandchildren, travel      AROM   Right Knee Extension 5   seated LAQ, 0 supported in supine   Right Knee Flexion 116      Strength   Right Hip Flexion 4+/5    Right Hip Extension 4+/5    Right Hip External Rotation  4/5    Right Hip Internal Rotation 4+/5    Right Hip ABduction 4/5    Right Hip ADduction 4/5   tender in adductors with MMT resistance   Left Hip Flexion 5/5    Left Hip Extension 5/5    Left Hip External Rotation 4/5    Left Hip Internal Rotation 4+/5    Left Hip ABduction 4+/5    Left Hip ADduction 4/5    Right Knee Flexion 4+/5    Right Knee Extension 4+/5    Left Knee Flexion 5/5    Left Knee Extension 5/5    Right Ankle Dorsiflexion 4+/5    Right Ankle Plantar Flexion 4/5   10 SLS  heel raises   Left Ankle Dorsiflexion 4+/5    Left Ankle Plantar Flexion 5/5   20 SLS heel raises                          [facility] Adult PT Treatment/Exercise - 08/16/21 1658       Knee/Hip Exercises: Aerobic   Recumbent Bike L2 x 6 min      Knee/Hip Exercises: Standing   Hip Flexion Right;Left;10 reps;Stengthening;Knee straight    Hip Flexion Limitations red TB at ankle; UE support on back of chair    Terminal Knee Extension Right;10 reps;Strengthening;Theraband    Theraband Level (Terminal Knee Extension) Level 4 (Blue)    Terminal Knee Extension Limitations cues for quad & glute activation as knee straightens    Hip ADduction Right;Left;10 reps;Strengthening    Hip ADduction Limitations red TB at ankle; UE support on back of chair    Hip Abduction Right;Left;10 reps;Stengthening;Knee straight    Abduction Limitations red TB at ankle; UE support on back of chair    Hip Extension Right;Left;10 reps;Stengthening;Knee straight    Extension Limitations red TB at ankle; UE support on back of chair                     PT Education - 08/16/21 1745     Education Details HEP udpate     Person(s) Educated Patient    Methods Explanation;Demonstration;Verbal cues;Handout    Comprehension Verbalized understanding;Verbal cues required;Returned demonstration;Need further instruction              PT Short Term Goals - 07/18/21 1007       PT SHORT TERM GOAL #1   Title Patient will be independent with initial HEP    Status Achieved   07/18/21   Target Date 07/19/21               PT Long Term Goals - 08/16/21 1705       PT LONG TERM GOAL #1   Title Patient will be independent with ongoing/advanced HEP for self-management at home    Status Partially Met    Target Date 09/13/21      PT LONG TERM GOAL #2   Title Patient to report reduction in frequency and intensity of R knee pain by >/= 50% to allow for improved activity tolerance    Status Achieved   08/16/21 - Pt reports 50% improvement in pain since start of PT     PT LONG TERM GOAL #3   Title Patient will demonstrate R knee AROM to >/= 2-115 dg to allow for normal gait and stair mechanics    Status Partially Met   08/16/21 - met for flexion and supported extension, still lacking 5 in seated extension   Target Date 09/13/21      PT LONG TERM GOAL #4   Title Patient will demonstrate improved B LE strength to >/= 4+/5 for improved stability and ease of mobility    Status Partially Met   08/16/21 - refer to flowsheet   Target Date 09/13/21      PT LONG TERM GOAL #5   Title Patient will report improved walking tolerance sufficient to allow her to resume walking for exercise or attending community outings with her grandchildren    Status Partially Met   08/16/21 - Pt reports she feels that her walking is improving allowing her to better keep up her family   Target Date 09/13/21  PT LONG TERM GOAL #6   Title Patient will be able to walk for exercise for at least 30 minutes for continued cardiovascular and muscular endurance w/o limitation due to R knee pain    Status New    Target Date 09/13/21                    Plan - 08/16/21 1726     Clinical Impression Statement Candelaria reports 50% improvement in R knee pain since start of PT, allowing for improving walking tolerance and ability to keep up her family on outings but has not been able to start walking for exercise yet due to being sick with the flu for the past 2 weeks. Her R knee AROM has improved to 5-116 in sitting, but she is able to achieve full R knee extension when LE supported in supine. Her overall B LE strength is improving but she still demonstrates medial/lateral proximal weakness. We updated her HEP today to address some of the areas of continues weakness and limited ROM. Myosha has met LTG #2 and is progressing well toward her remaining goals but will benefit from recert for an additional 2x/wk for up to 4 weeks to address remaining strength and functional deficits.    Personal Factors and Comorbidities Age;Comorbidity 3+;Fitness;Past/Current Experience;Time since onset of injury/illness/exacerbation    Comorbidities A-fib, GERD, pre-diabetes, peripheral neuropathy, borderline HTN, Raynauds phenomenon    Examination-Activity Limitations Bend;Locomotion Level;Stairs;Transfers    Examination-Participation Restrictions Cleaning;Community Activity;Interpersonal Relationship;Occupation;Shop    Rehab Potential Good    PT Frequency 2x / week    PT Duration 4 weeks    PT Treatment/Interventions ADLs/Self Care Home Management;Cryotherapy;Electrical Stimulation;Iontophoresis 75m/ml Dexamethasone;Moist Heat;DME Instruction;Gait training;Stair training;Functional mobility training;Therapeutic activities;Therapeutic exercise;Balance training;Neuromuscular re-education;Patient/family education;Manual techniques;Passive range of motion;Dry needling;Taping;Vasopneumatic Device;Joint Manipulations    PT Next Visit Plan Knee FOTO; Progress R knee ROM, core and B LE strengthening, MT and modalities PRN for pain and improved ROM    PT Home Exercise  Plan Access Code: [email or web address] (1/10, updated 1/16, 1/23 & 2/21)    Consulted and Agree with Plan of Care Patient             Patient will benefit from skilled therapeutic intervention in order to improve the following deficits and impairments:  Abnormal gait, Decreased activity tolerance, Decreased balance, Decreased endurance, Decreased mobility, Decreased range of motion, Decreased strength, Difficulty walking, Increased fascial restricitons, Impaired flexibility, Impaired perceived functional ability, Pain  Visit Diagnosis: Chronic pain of right knee  Stiffness of right knee, not elsewhere classified  Muscle weakness (generalized)  Other abnormalities of gait and mobility  Difficulty in walking, not elsewhere classified     Problem List Patient Active Problem List   Diagnosis Date Noted   Hypertension 02/02/2021   Melanoma of skin ([facility] 02/02/2021   Raynaud's phenomenon 02/02/2021   Arthritis of right knee 01/14/2021   Gastroesophageal reflux disease without esophagitis 01/14/2021   Need for Tdap vaccination 01/14/2021   Osteoarthritis of right knee 01/04/2021   Hyperthyroidism    Atrial fibrillation ([place] 07/29/2017    JPercival Spanish PT 08/16/2021, 6:24 PM  [facility] [address] [place] [address]Phone: [phone]  Fax:  [phone] Name: SYARIAH SELVEYMRN: [id number] of Birth: [birth year]

## 2021-08-18 ENCOUNTER — Encounter: Payer: Self-pay | Admitting: Cardiovascular Disease

## 2021-08-19 ENCOUNTER — Other Ambulatory Visit: Payer: Self-pay

## 2021-08-19 ENCOUNTER — Encounter: Payer: Self-pay | Admitting: Physical Therapy

## 2021-08-19 ENCOUNTER — Ambulatory Visit: Payer: BC Managed Care – PPO | Admitting: Physical Therapy

## 2021-08-19 DIAGNOSIS — R2689 Other abnormalities of gait and mobility: Secondary | ICD-10-CM

## 2021-08-19 DIAGNOSIS — R262 Difficulty in walking, not elsewhere classified: Secondary | ICD-10-CM

## 2021-08-19 DIAGNOSIS — M6281 Muscle weakness (generalized): Secondary | ICD-10-CM | POA: Diagnosis not present

## 2021-08-19 DIAGNOSIS — M25561 Pain in right knee: Secondary | ICD-10-CM | POA: Diagnosis not present

## 2021-08-19 DIAGNOSIS — G8929 Other chronic pain: Secondary | ICD-10-CM | POA: Diagnosis not present

## 2021-08-19 DIAGNOSIS — M25661 Stiffness of right knee, not elsewhere classified: Secondary | ICD-10-CM | POA: Diagnosis not present

## 2021-08-19 NOTE — Therapy (Signed)
[facility] [place] [facility] [address]  [place], [place], [address] Phone: [phone]   Fax:  [phone]  Physical Therapy Treatment  Patient Details  Name: Jean Hopkins MRN: [id number] Date of Birth: Feb 23, 1956 Referring Provider (PT): Victorino December, MD   Encounter Date: 08/19/2021   PT End of Session - 08/19/21 0847     Visit Number 11    Number of Visits 18    Date for PT Re-Evaluation 09/13/21    Authorization Type BCBS    PT Start Time 4128    PT Stop Time 0926    PT Time Calculation (min) 39 min    Activity Tolerance Patient tolerated treatment well    Behavior During Therapy [facility] for tasks assessed/performed             Past Medical History:  Diagnosis Date   AF (atrial fibrillation) ([place])    Allergy    Anemia    Arthritis 11/2020   Right knee   Blood transfusion without reported diagnosis    Cancer [facility])    Melanoma x2   Cataract    Hyperthyroidism     Past Surgical History:  Procedure Laterality Date   ABDOMINAL HYSTERECTOMY  2007   CHOLECYSTECTOMY  1988    There were no vitals filed for this visit.   Subjective Assessment - 08/19/21 0850     Subjective Pt reports more stiffness than pain this morning, but pain up to 2/10 during ellipitical warm-up.    Patient Stated Goals "to be able to do some type of exercise to help with weight loss"    Currently in Pain? No/denies                [facility] PT Assessment - 08/19/21 0847       Observation/Other Assessments   Focus on Therapeutic Outcomes (FOTO)  Knee = 63; predicted D/C FS = 66                           [facility] Adult PT Treatment/Exercise - 08/19/21 0847       Exercises   Exercises Knee/Hip      Knee/Hip Exercises: Aerobic   Elliptical L1.0 x 3 min      Knee/Hip Exercises: Standing   Hip Flexion Both;2 sets;10 reps;Stengthening;Knee bent    Hip Flexion Limitations green TB march + DF; 2 pole assist for balance     Forward Lunges Right;Left;10 reps;3 seconds    Forward Lunges Limitations TRX    Side Lunges Right;Left;10 reps;3 seconds    Side Lunges Limitations TRX    Hip Abduction Right;Left;10 reps;Stengthening;Knee straight    Abduction Limitations Fitter (1 black/1 blue); 2 pole A for balance    Hip Extension Right;Left;10 reps;Stengthening;Knee straight    Extension Limitations Fitter (1 black/1 blue); 2 pole A for balance    Functional Squat 2 sets;10 reps;3 seconds    Functional Squat Limitations TRX    Other Standing Knee Exercises B green TB (at ankles) side-stepping and fwd/back monster walk 2 x 20 ft                       PT Short Term Goals - 07/18/21 1007       PT SHORT TERM GOAL #1   Title Patient will be independent with initial HEP    Status Achieved   07/18/21   Target Date 07/19/21  PT Long Term Goals - 08/19/21 0924       PT LONG TERM GOAL #1   Title Patient will be independent with ongoing/advanced HEP for self-management at home    Status Partially Met    Target Date 09/13/21      PT LONG TERM GOAL #2   Title Patient to report reduction in frequency and intensity of R knee pain by >/= 50% to allow for improved activity tolerance    Status Achieved   08/16/21 - Pt reports 50% improvement in pain since start of PT     PT LONG TERM GOAL #3   Title Patient will demonstrate R knee AROM to >/= 2-115 dg to allow for normal gait and stair mechanics    Status Partially Met   08/16/21 - met for flexion and supported extension, still lacking 5 in seated extension   Target Date 09/13/21      PT LONG TERM GOAL #4   Title Patient will demonstrate improved B LE strength to >/= 4+/5 for improved stability and ease of mobility    Status Partially Met   08/16/21 - refer to flowsheet   Target Date 09/13/21      PT LONG TERM GOAL #5   Title Patient will report improved walking tolerance sufficient to allow her to resume walking for exercise or  attending community outings with her grandchildren    Status Partially Met   08/16/21 - Pt reports she feels that her walking is improving allowing her to better keep up her family   Target Date 09/13/21      PT LONG TERM GOAL #6   Title Patient will be able to walk for exercise for at least 30 minutes for continued cardiovascular and muscular endurance w/o limitation due to R knee pain    Status On-going    Target Date 09/13/21                   Plan - 08/19/21 0856     Clinical Impression Statement Jean Hopkins reports the most difficulty with walking up inclines therefore strengthening targeting movement patterns related to incline walking as well as addressing continued weakness identified in lateral hips on recent MMT. Mild knee pain noted during warm-up on elliptical but otherwise pt reporting fatigue with exercises requiring intermittent rest breaks but denies increased pain other than a little muscle soreness.    Comorbidities A-fib, GERD, pre-diabetes, peripheral neuropathy, borderline HTN, Raynauds phenomenon    Rehab Potential Good    PT Frequency 2x / week    PT Duration 4 weeks    PT Treatment/Interventions ADLs/Self Care Home Management;Cryotherapy;Electrical Stimulation;Iontophoresis 85m/ml Dexamethasone;Moist Heat;DME Instruction;Gait training;Stair training;Functional mobility training;Therapeutic activities;Therapeutic exercise;Balance training;Neuromuscular re-education;Patient/family education;Manual techniques;Passive range of motion;Dry needling;Taping;Vasopneumatic Device;Joint Manipulations    PT Next Visit Plan Progress R knee ROM, core and B LE strengthening, MT and modalities PRN for pain and improved ROM    PT Home Exercise Plan Access Code: [email or web address] (1/10, updated 1/16, 1/23 & 2/21)    Consulted and Agree with Plan of Care Patient             Patient will benefit from skilled therapeutic intervention in order to improve the following deficits and  impairments:  Abnormal gait, Decreased activity tolerance, Decreased balance, Decreased endurance, Decreased mobility, Decreased range of motion, Decreased strength, Difficulty walking, Increased fascial restricitons, Impaired flexibility, Impaired perceived functional ability, Pain  Visit Diagnosis: Chronic pain of right knee  Stiffness of right knee, not elsewhere classified  Muscle weakness (generalized)  Other abnormalities of gait and mobility  Difficulty in walking, not elsewhere classified     Problem List Patient Active Problem List   Diagnosis Date Noted   Hypertension 02/02/2021   Melanoma of skin ([place]) 02/02/2021   Raynaud's phenomenon 02/02/2021   Arthritis of right knee 01/14/2021   Gastroesophageal reflux disease without esophagitis 01/14/2021   Need for Tdap vaccination 01/14/2021   Osteoarthritis of right knee 01/04/2021   Hyperthyroidism    Atrial fibrillation ([place]) 07/29/2017    Percival Spanish, PT 08/19/2021, 9:29 AM  [facility] [address]  [place], [place], [address] Phone: [phone]   Fax:  [phone]  Name: Jean Hopkins MRN: [id number] Date of Birth: Nov 20, 1955

## 2021-08-22 ENCOUNTER — Encounter: Payer: Self-pay | Admitting: Physical Therapy

## 2021-08-22 ENCOUNTER — Ambulatory Visit: Payer: BC Managed Care – PPO | Admitting: Physical Therapy

## 2021-08-22 ENCOUNTER — Other Ambulatory Visit: Payer: Self-pay

## 2021-08-22 DIAGNOSIS — M6281 Muscle weakness (generalized): Secondary | ICD-10-CM | POA: Diagnosis not present

## 2021-08-22 DIAGNOSIS — G8929 Other chronic pain: Secondary | ICD-10-CM

## 2021-08-22 DIAGNOSIS — M25561 Pain in right knee: Secondary | ICD-10-CM | POA: Diagnosis not present

## 2021-08-22 DIAGNOSIS — M25661 Stiffness of right knee, not elsewhere classified: Secondary | ICD-10-CM

## 2021-08-22 DIAGNOSIS — R2689 Other abnormalities of gait and mobility: Secondary | ICD-10-CM

## 2021-08-22 DIAGNOSIS — R262 Difficulty in walking, not elsewhere classified: Secondary | ICD-10-CM

## 2021-08-22 NOTE — Therapy (Signed)
[facility] °[facility] °[address]  [address] °[place], [place], [address] °Phone: [phone]   Fax:  [phone] ° °Physical Therapy Treatment ° °Patient Details  °Name: Jean Hopkins °MRN: [id number] °Date of Birth: 09/09/1955 °Referring Provider (PT): Jason Rogers, MD ° ° °Encounter Date: 08/22/2021 ° ° PT End of Session - 08/22/21 0804   ° ° Visit Number 12   ° Number of Visits 18   ° Date for PT Re-Evaluation 09/13/21   ° Authorization Type BCBS   ° PT Start Time 0804   ° PT Stop Time 0845   ° PT Time Calculation (min) 41 min   ° Activity Tolerance Patient tolerated treatment well   ° Behavior During Therapy WFL for tasks assessed/performed   ° °  °  ° °  ° ° °Past Medical History:  °Diagnosis Date  ° AF (atrial fibrillation) ([facility])   ° Allergy   ° Anemia   ° Arthritis 11/2020  ° Right knee  ° Blood transfusion without reported diagnosis   ° Cancer ([facility])   ° Melanoma x2  ° Cataract   ° Hyperthyroidism   ° ° °Past Surgical History:  °Procedure Laterality Date  ° ABDOMINAL HYSTERECTOMY  2007  ° CHOLECYSTECTOMY  1988  ° ° °There were no vitals filed for this visit. ° ° Subjective Assessment - 08/22/21 0809   ° ° Subjective Pt reports increased muscle soreness following last PT visit. Still with mostly stiffness rather than pain in the mornings   ° Patient Stated Goals "to be able to do some type of exercise to help with weight loss"   ° Currently in Pain? No/denies   ° °  °  ° °  ° ° ° ° ° ° ° ° ° ° ° ° ° ° ° ° ° ° ° ° [facility] Adult PT Treatment/Exercise - 08/22/21 0804   ° °  ° Knee/Hip Exercises: Stretches  ° Passive Hamstring Stretch Right;Both;2 reps;30 seconds   ° Passive Hamstring Stretch Limitations hooklying with strap   ° Quad Stretch Right;Left;2 reps;30 seconds   ° Quad Stretch Limitations standing with foot behind on chair   ° ITB Stretch Right;Left;2 reps;30 seconds   ° ITB Stretch Limitations supine crossbody with strap   ° Other Knee/Hip Stretches R/L seated hip  adductor stretch 2 x 30 sec   ° Other Knee/Hip Stretches B supine frog leg/butterfly hip ADD stretch x 30 sec   °  ° Knee/Hip Exercises: Aerobic  ° Nustep L5 x 6 min (UE/LE)   °  ° Knee/Hip Exercises: Standing  ° Heel Raises Both;20 reps   ° Heel Raises Limitations + alt single LE eccentric lowering   °  ° Knee/Hip Exercises: Supine  ° Knee Extension Both;10 reps;Strengthening   ° Knee Extension Limitations quad + glute isometric pressing heels into peanut   ° Knee Flexion Both;AROM;Right;AAROM;20 reps   ° Knee Flexion Limitations HS curls with feet on peanut ball + strap for flexion ROM stretch on R   ° Other Supine Knee/Hip Exercises B straight leg bridge with feet on peanut ball 10 x 3-5"   ° Other Supine Knee/Hip Exercises Bridge + HS curl with feet on peanut ball x 10   °  ° Knee/Hip Exercises: Sidelying  ° Clams R/L green TB clam 15 x 3"   ° Other Sidelying Knee/Hip Exercises R/L side plank elbow to knee x 5 each side   ° °  °  ° °  ° ° ° ° ° ° ° ° ° ° ° °   PT Short Term Goals - 07/18/21 1007   ° °  ° PT SHORT TERM GOAL #1  ° Title Patient will be independent with initial HEP   ° Status Achieved   07/18/21  ° Target Date 07/19/21   ° °  °  ° °  ° ° ° ° PT Long Term Goals - 08/19/21 0924   ° °  ° PT LONG TERM GOAL #1  ° Title Patient will be independent with ongoing/advanced HEP for self-management at home   ° Status Partially Met   ° Target Date 09/13/21   °  ° PT LONG TERM GOAL #2  ° Title Patient to report reduction in frequency and intensity of R knee pain by >/= 50% to allow for improved activity tolerance   ° Status Achieved   08/16/21 - Pt reports 50% improvement in pain since start of PT  °  ° PT LONG TERM GOAL #3  ° Title Patient will demonstrate R knee AROM to >/= 2-115 dg to allow for normal gait and stair mechanics   ° Status Partially Met   08/16/21 - met for flexion and supported extension, still lacking 5° in seated extension  ° Target Date 09/13/21   °  ° PT LONG TERM GOAL #4  ° Title Patient will  demonstrate improved B LE strength to >/= 4+/5 for improved stability and ease of mobility   ° Status Partially Met   08/16/21 - refer to flowsheet  ° Target Date 09/13/21   °  ° PT LONG TERM GOAL #5  ° Title Patient will report improved walking tolerance sufficient to allow her to resume walking for exercise or attending community outings with her grandchildren   ° Status Partially Met   08/16/21 - Pt reports she feels that her walking is improving allowing her to better keep up her family  ° Target Date 09/13/21   °  ° PT LONG TERM GOAL #6  ° Title Patient will be able to walk for exercise for at least 30 minutes for continued cardiovascular and muscular endurance w/o limitation due to R knee pain   ° Status On-going   ° Target Date 09/13/21   ° °  °  ° °  ° ° ° ° ° ° ° ° Plan - 08/22/21 0811   ° ° Clinical Impression Statement Jean Hopkins reports increased muscle soreness following last visit along with stiffness in her knee this morning, therefore session initiated with stretching following warm-up to help alleviate some of the soreness and stiffness. She does report she was able to walk ~½ mile yesterday w/o limitations due to her R knee. TE targeting R knee ROM and continued proximal strengthening focusing on ongoing limitations noted with most recent MMT. Less soreness noted today but encouraged pt to use stretches and keep moving to mitigate any potential post-exercise soreness.   ° Comorbidities A-fib, GERD, pre-diabetes, peripheral neuropathy, borderline HTN, Raynaud’s phenomenon   ° Rehab Potential Good   ° PT Frequency 2x / week   ° PT Duration 4 weeks   ° PT Treatment/Interventions ADLs/Self Care Home Management;Cryotherapy;Electrical Stimulation;Iontophoresis 4mg/ml Dexamethasone;Moist Heat;DME Instruction;Gait training;Stair training;Functional mobility training;Therapeutic activities;Therapeutic exercise;Balance training;Neuromuscular re-education;Patient/family education;Manual techniques;Passive range of  motion;Dry needling;Taping;Vasopneumatic Device;Joint Manipulations   ° PT Next Visit Plan Progress R knee ROM, core and B LE strengthening, MT and modalities PRN for pain and improved ROM   ° PT Home Exercise Plan Access Code: [email or web address] (1/10, updated 1/16, 1/23 & 2/21)   °   Consulted and Agree with Plan of Care Patient   ° °  °  ° °  ° ° °Patient will benefit from skilled therapeutic intervention in order to improve the following deficits and impairments:  Abnormal gait, Decreased activity tolerance, Decreased balance, Decreased endurance, Decreased mobility, Decreased range of motion, Decreased strength, Difficulty walking, Increased fascial restricitons, Impaired flexibility, Impaired perceived functional ability, Pain ° °Visit Diagnosis: °Chronic pain of right knee ° °Stiffness of right knee, not elsewhere classified ° °Muscle weakness (generalized) ° °Other abnormalities of gait and mobility ° °Difficulty in walking, not elsewhere classified ° ° ° ° °Problem List °Patient Active Problem List  ° Diagnosis Date Noted  ° Hypertension 02/02/2021  ° Melanoma of skin ([facility]) 02/02/2021  ° Raynaud's phenomenon 02/02/2021  ° Arthritis of right knee 01/14/2021  ° Gastroesophageal reflux disease without esophagitis 01/14/2021  ° Need for Tdap vaccination 01/14/2021  ° Osteoarthritis of right knee 01/04/2021  ° Hyperthyroidism   ° Atrial fibrillation ([facility]) 07/29/2017  ° ° °JoAnne M Kreis, PT °08/22/2021, 10:39 AM ° °[facility] °[facility] [place] [facility] °[address]  [address] °[place], [place], [address] °Phone: [phone]   Fax:  [phone] ° °Name: Caleesi S Marsicano °MRN: [id number] °Date of Birth: 09/03/1955 ° ° ° °

## 2021-08-25 ENCOUNTER — Encounter: Payer: Self-pay | Admitting: Physical Therapy

## 2021-08-25 ENCOUNTER — Other Ambulatory Visit: Payer: Self-pay

## 2021-08-25 ENCOUNTER — Ambulatory Visit: Payer: BC Managed Care – PPO | Attending: Orthopedic Surgery | Admitting: Physical Therapy

## 2021-08-25 DIAGNOSIS — G8929 Other chronic pain: Secondary | ICD-10-CM | POA: Insufficient documentation

## 2021-08-25 DIAGNOSIS — R262 Difficulty in walking, not elsewhere classified: Secondary | ICD-10-CM | POA: Insufficient documentation

## 2021-08-25 DIAGNOSIS — M25561 Pain in right knee: Secondary | ICD-10-CM | POA: Insufficient documentation

## 2021-08-25 DIAGNOSIS — R2689 Other abnormalities of gait and mobility: Secondary | ICD-10-CM | POA: Insufficient documentation

## 2021-08-25 DIAGNOSIS — M25661 Stiffness of right knee, not elsewhere classified: Secondary | ICD-10-CM | POA: Insufficient documentation

## 2021-08-25 DIAGNOSIS — M6281 Muscle weakness (generalized): Secondary | ICD-10-CM | POA: Diagnosis not present

## 2021-08-25 NOTE — Therapy (Signed)
[place] ?[facility] ?[address] ?[place], [place], [address] ?Phone: [phone]   Fax:  [phone] ? ?Physical Therapy Treatment ? ?Patient Details  ?Name: Jean Hopkins ?MRN: [id number] ?Date of Birth: [birth year] ?Referring Provider (PT): Victorino December, MD ? ? ?Encounter Date: 08/25/2021 ? ? PT End of Session - 08/25/21 0759   ? ? Visit Number 13   ? Number of Visits 18   ? Date for PT Re-Evaluation 09/13/21   ? Authorization Type BCBS   ? PT Start Time 0800   ? PT Stop Time 0844   ? PT Time Calculation (min) 44 min   ? Activity Tolerance Patient tolerated treatment well   ? Behavior During Therapy [facility] for tasks assessed/performed   ? ?  ?  ? ?  ? ? ?Past Medical History:  ?Diagnosis Date  ? AF (atrial fibrillation) ([place])   ? Allergy   ? Anemia   ? Arthritis 11/2020  ? Right knee  ? Blood transfusion without reported diagnosis   ? Cancer [facility])   ? Melanoma x2  ? Cataract   ? Hyperthyroidism   ? ? ?Past Surgical History:  ?Procedure Laterality Date  ? ABDOMINAL HYSTERECTOMY  2007  ? CHOLECYSTECTOMY  1988  ? ? ?There were no vitals filed for this visit. ? ? Subjective Assessment - 08/25/21 0759   ? ? Subjective Pt reporting just stiffness today.   ? How long can you walk comfortably? stiff in the morning; steps or inclines affect it   ? Patient Stated Goals "to be able to do some type of exercise to help with weight loss"   ? Currently in Pain? No/denies   ? ?  ?  ? ?  ? ? ? ? ? ? ? ? ? ? ? ? ? ? ? ? ? ? ? ? [place] Adult PT Treatment/Exercise - 08/25/21 0001   ? ?  ? Knee/Hip Exercises: Stretches  ? Passive Hamstring Stretch Right;Both;1 rep;60 seconds   ? Passive Hamstring Stretch Limitations hooklying with strap   ? Quad Stretch Both;1 rep;60 seconds   ? Quad Stretch Limitations standing with foot behind on chair   ? ITB Stretch Right;Left;1 rep;60 seconds   ? ITB Stretch Limitations supine crossbody with strap   ? Other Knee/Hip Stretches R/L seated hip adductor  stretch 2 x 30 sec   ? Other Knee/Hip Stretches B supine frog leg/butterfly hip ADD stretch x 60 sec   ?  ? Knee/Hip Exercises: Aerobic  ? Nustep L5 x 6 min (UE/LE)   ?  ? Knee/Hip Exercises: Standing  ? Heel Raises Both;20 reps   ? Heel Raises Limitations + alt single LE eccentric lowering; also did SL heel raise x 10 ea   ?  ? Knee/Hip Exercises: Supine  ? Knee Flexion Both;AROM;Right;AAROM;20 reps   ? Knee Flexion Limitations HS curls with feet on peanut ball + strap for flexion ROM stretch on R   ? Other Supine Knee/Hip Exercises B straight leg bridge with feet on peanut ball 10 x 3-5"   ? Other Supine Knee/Hip Exercises Bridge + HS curl with feet on peanut ball x 10   ?  ? Knee/Hip Exercises: Sidelying  ? Hip ABduction Both;15 reps   ? Hip ABduction Limitations GTB   ? Clams R/L green TB clam 15 x 3"   ? Other Sidelying Knee/Hip Exercises R/L side plank elbow to knee x 5 each side  also one side plank max hold at end (hurt left elbow)  ? ?  ?  ? ?  ? ? ? ? ? ? ? ? ? ? ? ? PT Short Term Goals - 07/18/21 1007   ? ?  ? PT SHORT TERM GOAL #1  ? Title Patient will be independent with initial HEP   ? Status Achieved   07/18/21  ? Target Date 07/19/21   ? ?  ?  ? ?  ? ? ? ? PT Long Term Goals - 08/19/21 0924   ? ?  ? PT LONG TERM GOAL #1  ? Title Patient will be independent with ongoing/advanced HEP for self-management at home   ? Status Partially Met   ? Target Date 09/13/21   ?  ? PT LONG TERM GOAL #2  ? Title Patient to report reduction in frequency and intensity of R knee pain by >/= 50% to allow for improved activity tolerance   ? Status Achieved   08/16/21 - Pt reports 50% improvement in pain since start of PT  ?  ? PT LONG TERM GOAL #3  ? Title Patient will demonstrate R knee AROM to >/= 2-115 dg to allow for normal gait and stair mechanics   ? Status Partially Met   08/16/21 - met for flexion and supported extension, still lacking 5? in seated extension  ? Target Date 09/13/21   ?  ? PT LONG TERM GOAL #4  ?  Title Patient will demonstrate improved B LE strength to >/= 4+/5 for improved stability and ease of mobility   ? Status Partially Met   08/16/21 - refer to flowsheet  ? Target Date 09/13/21   ?  ? PT LONG TERM GOAL #5  ? Title Patient will report improved walking tolerance sufficient to allow her to resume walking for exercise or attending community outings with her grandchildren   ? Status Partially Met   08/16/21 - Pt reports she feels that her walking is improving allowing her to better keep up her family  ? Target Date 09/13/21   ?  ? PT LONG TERM GOAL #6  ? Title Patient will be able to walk for exercise for at least 30 minutes for continued cardiovascular and muscular endurance w/o limitation due to R knee pain   ? Status On-going   ? Target Date 09/13/21   ? ?  ?  ? ?  ? ? ? ? ? ? ? ? Plan - 08/25/21 0836   ? ? Clinical Impression Statement Patient did well with good tolerance to TE. Greatest difficulty is with side plank.   ? PT Treatment/Interventions ADLs/Self Care Home Management;Cryotherapy;Electrical Stimulation;Iontophoresis 32m/ml Dexamethasone;Moist Heat;DME Instruction;Gait training;Stair training;Functional mobility training;Therapeutic activities;Therapeutic exercise;Balance training;Neuromuscular re-education;Patient/family education;Manual techniques;Passive range of motion;Dry needling;Taping;Vasopneumatic Device;Joint Manipulations   ? PT Next Visit Plan Progress R knee ROM, core and B LE strengthening, MT and modalities PRN for pain and improved ROM   ? ?  ?  ? ?  ? ? ?Patient will benefit from skilled therapeutic intervention in order to improve the following deficits and impairments:  Abnormal gait, Decreased activity tolerance, Decreased balance, Decreased endurance, Decreased mobility, Decreased range of motion, Decreased strength, Difficulty walking, Increased fascial restricitons, Impaired flexibility, Impaired perceived functional ability, Pain ? ?Visit Diagnosis: ?Chronic pain of right  knee ? ?Stiffness of right knee, not elsewhere classified ? ?Muscle weakness (generalized) ? ?Other abnormalities of gait and mobility ? ? ? ? ?Problem List ?Patient Active Problem  List  ? Diagnosis Date Noted  ? Hypertension 02/02/2021  ? Melanoma of skin ([place]) 02/02/2021  ? Raynaud's phenomenon 02/02/2021  ? Arthritis of right knee 01/14/2021  ? Gastroesophageal reflux disease without esophagitis 01/14/2021  ? Need for Tdap vaccination 01/14/2021  ? Osteoarthritis of right knee 01/04/2021  ? Hyperthyroidism   ? Atrial fibrillation ([place]) 07/29/2017  ? ? ?Madelyn Flavors, PT ?08/25/2021, 8:44 AM ? ?[place] ?[facility] ?[address] ?[place], [place], [address] ?Phone: [phone]   Fax:  [phone] ? ?Name: Jean Hopkins ?MRN: [id number] ?Date of Birth: 1956/05/29 ? ? ? ?

## 2021-08-30 ENCOUNTER — Other Ambulatory Visit: Payer: Self-pay

## 2021-08-30 ENCOUNTER — Ambulatory Visit: Payer: BC Managed Care – PPO

## 2021-08-30 DIAGNOSIS — M25661 Stiffness of right knee, not elsewhere classified: Secondary | ICD-10-CM

## 2021-08-30 DIAGNOSIS — R262 Difficulty in walking, not elsewhere classified: Secondary | ICD-10-CM | POA: Diagnosis not present

## 2021-08-30 DIAGNOSIS — R2689 Other abnormalities of gait and mobility: Secondary | ICD-10-CM | POA: Diagnosis not present

## 2021-08-30 DIAGNOSIS — G8929 Other chronic pain: Secondary | ICD-10-CM | POA: Diagnosis not present

## 2021-08-30 DIAGNOSIS — M6281 Muscle weakness (generalized): Secondary | ICD-10-CM

## 2021-08-30 DIAGNOSIS — M25561 Pain in right knee: Secondary | ICD-10-CM | POA: Diagnosis not present

## 2021-08-30 NOTE — Therapy (Signed)
[place] ?[facility] ?[address] ?[place], [place], [address] ?Phone: [phone]   Fax:  [phone] ? ?Physical Therapy Treatment ? ?Patient Details  ?Name: Jean Hopkins ?MRN: [id number] ?Date of Birth: 12-09-1955 ?Referring Provider (PT): Victorino December, MD ? ? ?Encounter Date: 08/30/2021 ? ? PT End of Session - 08/30/21 0849   ? ? Visit Number 14   ? Number of Visits 18   ? Date for PT Re-Evaluation 09/13/21   ? Authorization Type BCBS   ? PT Start Time 0802   ? PT Stop Time [id number]   ? PT Time Calculation (min) 40 min   ? Activity Tolerance Patient tolerated treatment well   ? Behavior During Therapy [facility] for tasks assessed/performed   ? ?  ?  ? ?  ? ? ?Past Medical History:  ?Diagnosis Date  ? AF (atrial fibrillation) ([place])   ? Allergy   ? Anemia   ? Arthritis 11/2020  ? Right knee  ? Blood transfusion without reported diagnosis   ? Cancer [facility])   ? Melanoma x2  ? Cataract   ? Hyperthyroidism   ? ? ?Past Surgical History:  ?Procedure Laterality Date  ? ABDOMINAL HYSTERECTOMY  2007  ? CHOLECYSTECTOMY  1988  ? ? ?There were no vitals filed for this visit. ? ? Subjective Assessment - 08/30/21 0803   ? ? Subjective Pt reports soreness from walking last night.   ? Patient Stated Goals "to be able to do some type of exercise to help with weight loss"   ? Currently in Pain? No/denies   ? ?  ?  ? ?  ? ? ? ? ? ? ? ? ? ? ? ? ? ? ? ? ? ? ? ? [place] Adult PT Treatment/Exercise - 08/30/21 0001   ? ?  ? Knee/Hip Exercises: Stretches  ? Active Hamstring Stretch Right;2 reps;30 seconds   ? Active Hamstring Stretch Limitations hooklying with strap   ? Hip Flexor Stretch Right;2 reps;30 seconds   ? Hip Flexor Stretch Limitations mod thomas with strap   ? Other Knee/Hip Stretches R SKTC stretch 2x30"   ?  ? Knee/Hip Exercises: Aerobic  ? Nustep L6 x 6 min (UE/LE)   ?  ? Knee/Hip Exercises: Machines for Strengthening  ? Cybex Knee Extension 30lb BLE 2x10, 20lb RLE 2x10   ? Cybex Knee  Flexion 30lb B LE 2x10, 20lb R LE 2x10   ? Cybex Leg Press 30# B LE 2 x 10, 15# R LE 2x10   ?  ? Knee/Hip Exercises: Supine  ? Quad Sets Strengthening;Right;10 reps   ? Straight Leg Raises Strengthening;Both;10 reps   ? Straight Leg Raises Limitations with 2lb weight   ? Straight Leg Raise with External Rotation Strengthening;Both;10 reps   ? Straight Leg Raise with External Rotation Limitations with 2lb weight   ?  ? Knee/Hip Exercises: Sidelying  ? Hip ABduction Strengthening;Right;10 reps   ? Hip ABduction Limitations GTB at mid tibia   ? Clams R green TB clam 20 x 3"   ?  ? Manual Therapy  ? Manual Therapy Passive ROM   ? Passive ROM knee flexion to end range in supine; overpressure into knee extension with heel propped on bolster   ? ?  ?  ? ?  ? ? ? ? ? ? ? ? ? ? ? ? PT Short Term Goals - 07/18/21 1007   ? ?  ? PT  SHORT TERM GOAL #1  ? Title Patient will be independent with initial HEP   ? Status Achieved   07/18/21  ? Target Date 07/19/21   ? ?  ?  ? ?  ? ? ? ? PT Long Term Goals - 08/30/21 0806   ? ?  ? PT LONG TERM GOAL #1  ? Title Patient will be independent with ongoing/advanced HEP for self-management at home   ? Status Partially Met   ? Target Date 09/13/21   ?  ? PT LONG TERM GOAL #2  ? Title Patient to report reduction in frequency and intensity of R knee pain by >/= 50% to allow for improved activity tolerance   ? Status Achieved   08/16/21 - Pt reports 50% improvement in pain since start of PT  ?  ? PT LONG TERM GOAL #3  ? Title Patient will demonstrate R knee AROM to >/= 2-115 dg to allow for normal gait and stair mechanics   ? Status Partially Met   08/16/21 - met for flexion and supported extension, still lacking 5? in seated extension  ? Target Date 09/13/21   ?  ? PT LONG TERM GOAL #4  ? Title Patient will demonstrate improved B LE strength to >/= 4+/5 for improved stability and ease of mobility   ? Status Partially Met   08/16/21 - refer to flowsheet  ? Target Date 09/13/21   ?  ? PT LONG TERM  GOAL #5  ? Title Patient will report improved walking tolerance sufficient to allow her to resume walking for exercise or attending community outings with her grandchildren   ? Status Partially Met   08/16/21 - Pt reports she feels that her walking is improving allowing her to better keep up her family  ? Target Date 09/13/21   ?  ? PT LONG TERM GOAL #6  ? Title Patient will be able to walk for exercise for at least 30 minutes for continued cardiovascular and muscular endurance w/o limitation due to R knee pain   ? Status On-going   walked 15 min last night, reports soreness today  ? Target Date 09/13/21   ? ?  ?  ? ?  ? ? ? ? ? ? ? ? Plan - 08/30/21 0849   ? ? Clinical Impression Statement Pt had a good tolerance for TE, able to progress weight machines and other exercises to w/o increased pain. Continued working on knee ROM, she did report some tightness with end range flexion. She was able to walk ~15 min yesterday w/o limitations but noted some soreness today from it, she is making progress with walking tolerance. Pt responded well to the treatment today.   ? Personal Factors and Comorbidities Age;Comorbidity 3+;Fitness;Past/Current Experience;Time since onset of injury/illness/exacerbation   ? Comorbidities A-fib, GERD, pre-diabetes, peripheral neuropathy, borderline HTN, Raynaud?s phenomenon   ? PT Frequency 2x / week   ? PT Duration 4 weeks   ? PT Treatment/Interventions ADLs/Self Care Home Management;Cryotherapy;Electrical Stimulation;Iontophoresis 13m/ml Dexamethasone;Moist Heat;DME Instruction;Gait training;Stair training;Functional mobility training;Therapeutic activities;Therapeutic exercise;Balance training;Neuromuscular re-education;Patient/family education;Manual techniques;Passive range of motion;Dry needling;Taping;Vasopneumatic Device;Joint Manipulations   ? PT Next Visit Plan Progress R knee ROM, core and B LE strengthening, MT and modalities PRN for pain and improved ROM   ? PT Home Exercise Plan  Access Code: [email or web address] (1/10, updated 1/16, 1/23 & 2/21)   ? Consulted and Agree with Plan of Care Patient   ? ?  ?  ? ?  ? ? ?  Patient will benefit from skilled therapeutic intervention in order to improve the following deficits and impairments:  Abnormal gait, Decreased activity tolerance, Decreased balance, Decreased endurance, Decreased mobility, Decreased range of motion, Decreased strength, Difficulty walking, Increased fascial restricitons, Impaired flexibility, Impaired perceived functional ability, Pain ? ?Visit Diagnosis: ?Chronic pain of right knee ? ?Stiffness of right knee, not elsewhere classified ? ?Muscle weakness (generalized) ? ?Other abnormalities of gait and mobility ? ?Difficulty in walking, not elsewhere classified ? ? ? ? ?Problem List ?Patient Active Problem List  ? Diagnosis Date Noted  ? Hypertension 02/02/2021  ? Melanoma of skin ([place]) 02/02/2021  ? Raynaud's phenomenon 02/02/2021  ? Arthritis of right knee 01/14/2021  ? Gastroesophageal reflux disease without esophagitis 01/14/2021  ? Need for Tdap vaccination 01/14/2021  ? Osteoarthritis of right knee 01/04/2021  ? Hyperthyroidism   ? Atrial fibrillation ([place]) 07/29/2017  ? ? ?Artist Pais, PTA ?08/30/2021, 8:57 AM ? ?[place] ?[facility] ?[address] ?[place], [place], [address] ?Phone: [phone]   Fax:  [phone] ? ?Name: Jean Hopkins ?MRN: [id number] ?Date of Birth: [birth year] ? ? ? ?

## 2021-09-05 ENCOUNTER — Other Ambulatory Visit: Payer: Self-pay

## 2021-09-05 ENCOUNTER — Ambulatory Visit: Payer: BC Managed Care – PPO

## 2021-09-05 DIAGNOSIS — R2689 Other abnormalities of gait and mobility: Secondary | ICD-10-CM

## 2021-09-05 DIAGNOSIS — M25661 Stiffness of right knee, not elsewhere classified: Secondary | ICD-10-CM

## 2021-09-05 DIAGNOSIS — G8929 Other chronic pain: Secondary | ICD-10-CM | POA: Diagnosis not present

## 2021-09-05 DIAGNOSIS — R262 Difficulty in walking, not elsewhere classified: Secondary | ICD-10-CM

## 2021-09-05 DIAGNOSIS — M6281 Muscle weakness (generalized): Secondary | ICD-10-CM | POA: Diagnosis not present

## 2021-09-05 DIAGNOSIS — M25561 Pain in right knee: Secondary | ICD-10-CM | POA: Diagnosis not present

## 2021-09-05 NOTE — Therapy (Signed)
[place] ?[facility] ?[place] ?[place], [place], [address] ?Phone: [phone]   Fax:  [phone] ? ?Physical Therapy Treatment ? ?Patient Details  ?Name: Jean Hopkins ?MRN: [id number] ?Date of Birth: 1955-07-03 ?Referring Provider (PT): Victorino December, MD ? ? ?Encounter Date: 09/05/2021 ? ? PT End of Session - 09/05/21 1801   ? ? Visit Number 15   ? Number of Visits 18   ? Date for PT Re-Evaluation 09/13/21   ? Authorization Type BCBS   ? PT Start Time 1702   ? PT Stop Time 1742   ? PT Time Calculation (min) 40 min   ? Activity Tolerance Patient tolerated treatment well   ? Behavior During Therapy [facility] for tasks assessed/performed   ? ?  ?  ? ?  ? ? ?Past Medical History:  ?Diagnosis Date  ? AF (atrial fibrillation) ([place])   ? Allergy   ? Anemia   ? Arthritis 11/2020  ? Right knee  ? Blood transfusion without reported diagnosis   ? Cancer [facility])   ? Melanoma x2  ? Cataract   ? Hyperthyroidism   ? ? ?Past Surgical History:  ?Procedure Laterality Date  ? ABDOMINAL HYSTERECTOMY  2007  ? CHOLECYSTECTOMY  1988  ? ? ?There were no vitals filed for this visit. ? ? Subjective Assessment - 09/05/21 1704   ? ? Subjective Pt reports just a twinge from working all day.   ? Patient Stated Goals "to be able to do some type of exercise to help with weight loss"   ? Currently in Pain? No/denies   ? ?  ?  ? ?  ? ? ? ? ? ? ? ? ? ? ? ? ? ? ? ? ? ? ? ? [place] Adult PT Treatment/Exercise - 09/05/21 0001   ? ?  ? Ambulation/Gait  ? Ambulation/Gait Yes   ? Ambulation Distance (Feet) 800 Feet   ? Assistive device None   ? Gait Pattern Step-through pattern;Decreased step length - left;Decreased stance time - right   ? Stairs Yes   ? Stairs Assistance 6: Modified independent (Device/Increase time)   ? Stair Management Technique One rail Right   ? Number of Stairs 13   ? Height of Stairs 8   ?  ? Knee/Hip Exercises: Machines for Strengthening  ? Cybex Knee Extension 30lb BLE 3x10   ? Cybex  Knee Flexion 30lb B LE 2x10   ? Cybex Leg Press 35# B LE 2 x 10, 20# R LE 2x10   ? ?  ?  ? ?  ? ? ? ? ? ? ? ? ? ? ? ? PT Short Term Goals - 07/18/21 1007   ? ?  ? PT SHORT TERM GOAL #1  ? Title Patient will be independent with initial HEP   ? Status Achieved   07/18/21  ? Target Date 07/19/21   ? ?  ?  ? ?  ? ? ? ? PT Long Term Goals - 09/05/21 1730   ? ?  ? PT LONG TERM GOAL #1  ? Title Patient will be independent with ongoing/advanced HEP for self-management at home   ? Status Partially Met   ? Target Date 09/13/21   ?  ? PT LONG TERM GOAL #2  ? Title Patient to report reduction in frequency and intensity of R knee pain by >/= 50% to allow for improved activity tolerance   ? Status Achieved  08/16/21 - Pt reports 50% improvement in pain since start of PT  ?  ? PT LONG TERM GOAL #3  ? Title Patient will demonstrate R knee AROM to >/= 2-115 dg to allow for normal gait and stair mechanics   ? Status Partially Met   08/16/21 - met for flexion and supported extension, still lacking 5? in seated extension  ? Target Date 09/13/21   ?  ? PT LONG TERM GOAL #4  ? Title Patient will demonstrate improved B LE strength to >/= 4+/5 for improved stability and ease of mobility   ? Status Partially Met   08/16/21 - refer to flowsheet  ? Target Date 09/13/21   ?  ? PT LONG TERM GOAL #5  ? Title Patient will report improved walking tolerance sufficient to allow her to resume walking for exercise or attending community outings with her grandchildren   ? Status Partially Met   08/16/21 - Pt reports she feels that her walking is improving allowing her to better keep up her family  ? Target Date 09/13/21   ?  ? PT LONG TERM GOAL #6  ? Title Patient will be able to walk for exercise for at least 30 minutes for continued cardiovascular and muscular endurance w/o limitation due to R knee pain   ? Status Partially Met   able to walk for 20 min, only some tenderness in the knee afterwards but subsided with sitting rest  ? Target Date 09/13/21    ? ?  ?  ? ?  ? ? ? ? ? ? ? ? Plan - 09/05/21 1801   ? ? Clinical Impression Statement Pt was able to walk for 20 min today however began to note some tenderness in her R knee along the 20 min. Her gait shows decreased stance time on her R leg but she denies being limited by pain while walking. She showed mild compensation with stairs due to R knee weakness. No complaints with the progression of treatment.   ? Personal Factors and Comorbidities Age;Comorbidity 3+;Fitness;Past/Current Experience;Time since onset of injury/illness/exacerbation   ? Comorbidities A-fib, GERD, pre-diabetes, peripheral neuropathy, borderline HTN, Raynaud?s phenomenon   ? PT Frequency 2x / week   ? PT Duration 4 weeks   ? PT Treatment/Interventions ADLs/Self Care Home Management;Cryotherapy;Electrical Stimulation;Iontophoresis 30m/ml Dexamethasone;Moist Heat;DME Instruction;Gait training;Stair training;Functional mobility training;Therapeutic activities;Therapeutic exercise;Balance training;Neuromuscular re-education;Patient/family education;Manual techniques;Passive range of motion;Dry needling;Taping;Vasopneumatic Device;Joint Manipulations   ? PT Next Visit Plan Progress R knee ROM, core and B LE strengthening, MT and modalities PRN for pain and improved ROM   ? PT Home Exercise Plan Access Code: [email or web address] (1/10, updated 1/16, 1/23 & 2/21)   ? Consulted and Agree with Plan of Care Patient   ? ?  ?  ? ?  ? ? ?Patient will benefit from skilled therapeutic intervention in order to improve the following deficits and impairments:  Abnormal gait, Decreased activity tolerance, Decreased balance, Decreased endurance, Decreased mobility, Decreased range of motion, Decreased strength, Difficulty walking, Increased fascial restricitons, Impaired flexibility, Impaired perceived functional ability, Pain ? ?Visit Diagnosis: ?Chronic pain of right knee ? ?Stiffness of right knee, not elsewhere classified ? ?Muscle weakness (generalized) ? ?Other  abnormalities of gait and mobility ? ?Difficulty in walking, not elsewhere classified ? ? ? ? ?Problem List ?Patient Active Problem List  ? Diagnosis Date Noted  ? Hypertension 02/02/2021  ? Melanoma of skin ([place] 02/02/2021  ? Raynaud's phenomenon 02/02/2021  ? Arthritis of right knee 01/14/2021  ?  Gastroesophageal reflux disease without esophagitis 01/14/2021  ? Need for Tdap vaccination 01/14/2021  ? Osteoarthritis of right knee 01/04/2021  ? Hyperthyroidism   ? Atrial fibrillation ([place]) 07/29/2017  ? ? ?Artist Pais, PTA ?09/05/2021, 6:03 PM ? ?[place] ?[facility] ?[address] ?[place], [place], [address] ?Phone: [phone]   Fax:  [phone] ? ?Name: Jean Hopkins ?MRN: [id number] ?Date of Birth: [birth year] ? ? ? ?

## 2021-09-07 ENCOUNTER — Encounter: Payer: Self-pay | Admitting: Physical Therapy

## 2021-09-07 ENCOUNTER — Ambulatory Visit: Payer: BC Managed Care – PPO | Admitting: Physical Therapy

## 2021-09-07 ENCOUNTER — Other Ambulatory Visit: Payer: Self-pay

## 2021-09-07 DIAGNOSIS — M6281 Muscle weakness (generalized): Secondary | ICD-10-CM

## 2021-09-07 DIAGNOSIS — M25661 Stiffness of right knee, not elsewhere classified: Secondary | ICD-10-CM | POA: Diagnosis not present

## 2021-09-07 DIAGNOSIS — G8929 Other chronic pain: Secondary | ICD-10-CM

## 2021-09-07 DIAGNOSIS — R262 Difficulty in walking, not elsewhere classified: Secondary | ICD-10-CM

## 2021-09-07 DIAGNOSIS — R2689 Other abnormalities of gait and mobility: Secondary | ICD-10-CM

## 2021-09-07 DIAGNOSIS — M25561 Pain in right knee: Secondary | ICD-10-CM | POA: Diagnosis not present

## 2021-09-07 NOTE — Therapy (Signed)
[place] ?[facility] ?[address] ?[place], [place], [address] ?Phone: [phone]   Fax:  [phone] ? ?Physical Therapy Treatment ? ?Patient Details  ?Name: Jean Hopkins ?MRN: [id number] ?Date of Birth: 12-Sep-1955 ?Referring Provider (PT): Victorino December, MD ? ? ?Encounter Date: 09/07/2021 ? ? PT End of Session - 09/07/21 0803   ? ? Visit Number 16   ? Number of Visits 18   ? Date for PT Re-Evaluation 09/13/21   ? Authorization Type BCBS   ? PT Start Time [phone]   ? PT Stop Time 0844   ? PT Time Calculation (min) 41 min   ? Activity Tolerance Patient tolerated treatment well   ? Behavior During Therapy [facility] for tasks assessed/performed   ? ?  ?  ? ?  ? ? ?Past Medical History:  ?Diagnosis Date  ? AF (atrial fibrillation) ([place])   ? Allergy   ? Anemia   ? Arthritis 11/2020  ? Right knee  ? Blood transfusion without reported diagnosis   ? Cancer [facility])   ? Melanoma x2  ? Cataract   ? Hyperthyroidism   ? ? ?Past Surgical History:  ?Procedure Laterality Date  ? ABDOMINAL HYSTERECTOMY  2007  ? CHOLECYSTECTOMY  1988  ? ? ?There were no vitals filed for this visit. ? ? Subjective Assessment - 09/07/21 0806   ? ? Subjective Pt denies pain .."just a little stiff".   ? Patient Stated Goals "to be able to do some type of exercise to help with weight loss"   ? Currently in Pain? No/denies   ? ?  ?  ? ?  ? ? ? ? ? ? ? ? ? ? ? ? ? ? ? ? ? ? ? ? [facility] [place] Adult PT Treatment/Exercise - 09/07/21 0803   ? ?  ? Knee/Hip Exercises: Aerobic  ? Nustep L7 x 6 min (UE/LE)   ?  ? Knee/Hip Exercises: Standing  ? Hip Flexion Right;Left;10 reps;Stengthening;Knee straight   ? Hip Flexion Limitations green TB at ankle; UE support on back of chair   ? Terminal Knee Extension Right;Left;20 reps;Strengthening;Theraband   ? Theraband Level (Terminal Knee Extension) Level 3 (Green);Level 4 (Blue)   1 set each  ? Terminal Knee Extension Limitations cues for quad & glute activation as knee straightens   ?  Hip ADduction Right;Left;10 reps;Strengthening   ? Hip ADduction Limitations green TB at ankle; UE support on back of chair   ? Hip Abduction Right;Left;10 reps;Stengthening;Knee straight   ? Abduction Limitations green TB at ankle; UE support on back of chair   ? Hip Extension Right;Left;10 reps;Stengthening;Knee straight   ? Extension Limitations green TB at ankle; UE support on back of chair   ? Step Down Right;10 reps;Step Height: 6";Hand Hold: 2   ? Step Down Limitations eccentric lowering   ? ?  ?  ? ?  ? ? ? ? ? ? ? ? ? ? PT Education - 09/07/21 0844   ? ? Education Details HEP review, update and consolidation   ? Person(s) Educated Patient   ? Methods Explanation;Demonstration;Handout   ? Comprehension Verbalized understanding;Returned demonstration   ? ?  ?  ? ?  ? ? ? PT Short Term Goals - 07/18/21 1007   ? ?  ? PT SHORT TERM GOAL #1  ? Title Patient will be independent with initial HEP   ? Status Achieved   07/18/21  ? Target Date 07/19/21   ? ?  ?  ? ?  ? ? ? ?  PT Long Term Goals - 09/05/21 1730   ? ?  ? PT LONG TERM GOAL #1  ? Title Patient will be independent with ongoing/advanced HEP for self-management at home   ? Status Partially Met   ? Target Date 09/13/21   ?  ? PT LONG TERM GOAL #2  ? Title Patient to report reduction in frequency and intensity of R knee pain by >/= 50% to allow for improved activity tolerance   ? Status Achieved   08/16/21 - Pt reports 50% improvement in pain since start of PT  ?  ? PT LONG TERM GOAL #3  ? Title Patient will demonstrate R knee AROM to >/= 2-115 dg to allow for normal gait and stair mechanics   ? Status Partially Met   08/16/21 - met for flexion and supported extension, still lacking 5? in seated extension  ? Target Date 09/13/21   ?  ? PT LONG TERM GOAL #4  ? Title Patient will demonstrate improved B LE strength to >/= 4+/5 for improved stability and ease of mobility   ? Status Partially Met   08/16/21 - refer to flowsheet  ? Target Date 09/13/21   ?  ? PT LONG  TERM GOAL #5  ? Title Patient will report improved walking tolerance sufficient to allow her to resume walking for exercise or attending community outings with her grandchildren   ? Status Partially Met   08/16/21 - Pt reports she feels that her walking is improving allowing her to better keep up her family  ? Target Date 09/13/21   ?  ? PT LONG TERM GOAL #6  ? Title Patient will be able to walk for exercise for at least 30 minutes for continued cardiovascular and muscular endurance w/o limitation due to R knee pain   ? Status Partially Met   able to walk for 20 min, only some tenderness in the knee afterwards but subsided with sitting rest  ? Target Date 09/13/21   ? ?  ?  ? ?  ? ? ? ? ? ? ? ? Plan - 09/07/21 0807   ? ? Clinical Impression Statement Jean Hopkins notes improvement with PT, now with mostly just some stiffness in her knee rather than pain. Session focusing on review and update/consolidation of HEP in preparation for anticipated transition to HEP upon completion of current episode next visit. Some basic exercises eliminated from HEP as pt has incorporated these muscle activations/movements into more advanced exercises. Progressed theraband resistance to green TB for standing 4-way SLR and blue TB for TKE. Jean Hopkins feeling confident with HEP review/update and should be ready to transition to her HEP following final goal assessment on next visit.   ? Comorbidities A-fib, GERD, pre-diabetes, peripheral neuropathy, borderline HTN, Raynaud?s phenomenon   ? Rehab Potential Good   ? PT Frequency 2x / week   ? PT Duration 4 weeks   ? PT Treatment/Interventions ADLs/Self Care Home Management;Cryotherapy;Electrical Stimulation;Iontophoresis 65m/ml Dexamethasone;Moist Heat;DME Instruction;Gait training;Stair training;Functional mobility training;Therapeutic activities;Therapeutic exercise;Balance training;Neuromuscular re-education;Patient/family education;Manual techniques;Passive range of motion;Dry  needling;Taping;Vasopneumatic Device;Joint Manipulations   ? PT Next Visit Plan Goal assessment with anticipated transition to HEP +/- 30-day hold   ? PT Home Exercise Plan Access Code: [email or web address] (1/10, updated 1/16, 1/23, 2/21 & 3/15)   ? Consulted and Agree with Plan of Care Patient   ? ?  ?  ? ?  ? ? ?Patient will benefit from skilled therapeutic intervention in order to improve the following deficits and impairments:  Abnormal gait, Decreased activity tolerance, Decreased balance, Decreased endurance, Decreased mobility, Decreased range of motion, Decreased strength, Difficulty walking, Increased fascial restricitons, Impaired flexibility, Impaired perceived functional ability, Pain ? ?Visit Diagnosis: ?Chronic pain of right knee ? ?Stiffness of right knee, not elsewhere classified ? ?Muscle weakness (generalized) ? ?Other abnormalities of gait and mobility ? ?Difficulty in walking, not elsewhere classified ? ? ? ? ?Problem List ?Patient Active Problem List  ? Diagnosis Date Noted  ? Hypertension 02/02/2021  ? Melanoma of skin ([place]) 02/02/2021  ? Raynaud's phenomenon 02/02/2021  ? Arthritis of right knee 01/14/2021  ? Gastroesophageal reflux disease without esophagitis 01/14/2021  ? Need for Tdap vaccination 01/14/2021  ? Osteoarthritis of right knee 01/04/2021  ? Hyperthyroidism   ? Atrial fibrillation ([place]) 07/29/2017  ? ? ?Percival Spanish, PT ?09/07/2021, 3:58 PM ? ?[place] ?[facility] ?[address] ?[place], [place], [address] ?Phone: [phone]   Fax:  [phone] ? ?Name: Jean Hopkins ?MRN: [id number] ?Date of Birth: 06-24-1956 ? ? ? ?

## 2021-09-07 NOTE — Patient Instructions (Signed)
? ? ? ? ? ?  Access Code: [email or web address] ?URL: [email or web address] ?Date: 09/07/2021 ?Prepared by: Annie Paras ? ?Exercises ?Standing Gastroc Stretch at [employer] - 1-2 x daily - 7 x weekly - 3 reps - 30 sec hold ?Hooklying Hamstring Stretch with Strap - 1-2 x daily - 7 x weekly - 3 reps - 30 sec hold ?Supine ITB Stretch with Strap - 1-2 x daily - 7 x weekly - 3 reps - 30 sec hold ?Seated Hip Adductor Stretch - 1-2 x daily - 7 x weekly - 3 reps - 30 sec hold ?Quadricep Stretch with Chair and Counter Support - 1-2 x daily - 7 x weekly - 3 reps - 30 sec hold ?Standing March with Counter Support - 1 x daily - 4 x weekly - 2 sets - 10 reps - 2-3 sec hold ?Standing Hip Flexion with Anchored Resistance and Chair Support - 1 x daily - 4 x weekly - 2 sets - 10 reps - 3 sec hold ?Standing Hip Adduction with Anchored Resistance - 1 x daily - 4 x weekly - 2 sets - 10 reps - 3 sec hold ?Standing Hip Extension with Anchored Resistance - 1 x daily - 4 x weekly - 2 sets - 10 reps - 3 sec hold ?Standing Hip Abduction with Anchored Resistance - 1 x daily - 4 x weekly - 2 sets - 10 reps - 3 sec hold ?Standing Terminal Knee Extension with Resistance - 1 x daily - 4 x weekly - 2 sets - 10 reps - 5 sec hold ?Side Step Down with Counter Support - 1 x daily - 3-4 x weekly - 2 sets - 5-10 reps - 3 sec hold ? ?Patient Education ?Trigger Point Dry Needling ?Kinesiology tape ?

## 2021-09-12 ENCOUNTER — Ambulatory Visit: Payer: BC Managed Care – PPO | Admitting: Physical Therapy

## 2021-09-12 ENCOUNTER — Encounter: Payer: Self-pay | Admitting: Physical Therapy

## 2021-09-12 ENCOUNTER — Other Ambulatory Visit: Payer: Self-pay

## 2021-09-12 DIAGNOSIS — M25661 Stiffness of right knee, not elsewhere classified: Secondary | ICD-10-CM | POA: Diagnosis not present

## 2021-09-12 DIAGNOSIS — M6281 Muscle weakness (generalized): Secondary | ICD-10-CM | POA: Diagnosis not present

## 2021-09-12 DIAGNOSIS — R262 Difficulty in walking, not elsewhere classified: Secondary | ICD-10-CM | POA: Diagnosis not present

## 2021-09-12 DIAGNOSIS — R2689 Other abnormalities of gait and mobility: Secondary | ICD-10-CM | POA: Diagnosis not present

## 2021-09-12 DIAGNOSIS — M25561 Pain in right knee: Secondary | ICD-10-CM | POA: Diagnosis not present

## 2021-09-12 DIAGNOSIS — G8929 Other chronic pain: Secondary | ICD-10-CM | POA: Diagnosis not present

## 2021-09-12 NOTE — Therapy (Signed)
[place] ?[facility] ?[address] ?[place], [place], [address] ?Phone: [phone]   Fax:  [phone] ? ?Physical Therapy Treatment / Discharge Summary ? ?Patient Details  ?Name: Jean Hopkins ?MRN: [id number] ?Date of Birth: 06-Jul-1955 ?Referring Provider (PT): Victorino December, MD ? ? ?Encounter Date: 09/12/2021 ? ? PT End of Session - 09/12/21 1533   ? ? Visit Number 17   ? Number of Visits 18   ? Date for PT Re-Evaluation 09/13/21   ? Authorization Type BCBS   ? PT Start Time 1533   ? PT Stop Time 9485   ? PT Time Calculation (min) 30 min   ? Activity Tolerance Patient tolerated treatment well   ? Behavior During Therapy [facility] for tasks assessed/performed   ? ?  ?  ? ?  ? ? ?Past Medical History:  ?Diagnosis Date  ? AF (atrial fibrillation) ([place])   ? Allergy   ? Anemia   ? Arthritis 11/2020  ? Right knee  ? Blood transfusion without reported diagnosis   ? Cancer [facility])   ? Melanoma x2  ? Cataract   ? Hyperthyroidism   ? ? ?Past Surgical History:  ?Procedure Laterality Date  ? ABDOMINAL HYSTERECTOMY  2007  ? CHOLECYSTECTOMY  1988  ? ? ?There were no vitals filed for this visit. ? ? Subjective Assessment - 09/12/21 1537   ? ? Subjective Pt reports she did all of her strengthening last night, saving the step exercises for last and states her knee was "screaming" by the end, but just a little sore today.   ? Patient Stated Goals "to be able to do some type of exercise to help with weight loss"   ? Currently in Pain? No/denies   ? ?  ?  ? ?  ? ? ? ? ? [facility] PT Assessment - 09/12/21 1533   ? ?  ? Assessment  ? Medical Diagnosis R knee OA   ? Referring Provider (PT) Victorino December, MD   ? Onset Date/Surgical Date --   6+ months  ? Next MD Visit PRN   ?  ? Observation/Other Assessments  ? Focus on Therapeutic Outcomes (FOTO)  Knee = 67 (11 point improvement from eval) exceeding predicted D/C FS = 66   ?  ? AROM  ? Right Knee Extension 1   seated LAQ, 0? supported in supine  ?  Right Knee Flexion 121   ?  ? Strength  ? Right Hip Flexion 5/5   ? Right Hip Extension 5/5   ? Right Hip External Rotation  4+/5   ? Right Hip Internal Rotation 5/5   ? Right Hip ABduction 5/5   ? Right Hip ADduction 4+/5   ? Left Hip Flexion 5/5   ? Left Hip Extension 5/5   ? Left Hip External Rotation 4+/5   ? Left Hip Internal Rotation 5/5   ? Left Hip ABduction 5/5   ? Left Hip ADduction 4+/5   ? Right Knee Flexion 5/5   ? Right Knee Extension 5/5   ? Left Knee Flexion 5/5   ? Left Knee Extension 5/5   ? Right Ankle Dorsiflexion 5/5   ? Right Ankle Plantar Flexion 4+/5   20 SLS heel raises, fatigue noted at ~17 reps  ? Left Ankle Dorsiflexion 5/5   ? Left Ankle Plantar Flexion 5/5   20 SLS heel raises  ? ?  ?  ? ?  ? ? ? ? ? ? ? ? ? ? ? ? ? ? ? ?  [facility] Adult PT Treatment/Exercise - 09/12/21 1533   ? ?  ? Knee/Hip Exercises: Aerobic  ? Nustep L7 x 6 min (UE/LE)   ? ?  ?  ? ?  ? ? ? ? ? ? ? ? ? ? ? ? PT Short Term Goals - 07/18/21 1007   ? ?  ? PT SHORT TERM GOAL #1  ? Title Patient will be independent with initial HEP   ? Status Achieved   07/18/21  ? Target Date 07/19/21   ? ?  ?  ? ?  ? ? ? ? PT Long Term Goals - 09/12/21 1540   ? ?  ? PT LONG TERM GOAL #1  ? Title Patient will be independent with ongoing/advanced HEP for self-management at home   ? Status Achieved   09/12/21  ?  ? PT LONG TERM GOAL #2  ? Title Patient to report reduction in frequency and intensity of R knee pain by >/= 50% to allow for improved activity tolerance   ? Status Achieved   08/16/21 & 09/12/21 - Pt reports 50+% improvement in pain since start of PT  ?  ? PT LONG TERM GOAL #3  ? Title Patient will demonstrate R knee AROM to >/= 2-115 dg to allow for normal gait and stair mechanics   ? Status Achieved   09/12/21 - R knee AROM 1-121?, 0? extension when leg supported in supine  ?  ? PT LONG TERM GOAL #4  ? Title Patient will demonstrate improved B LE strength to >/= 4+/5 for improved stability and ease of mobility   ? Status Achieved    09/12/21  ?  ? PT LONG TERM GOAL #5  ? Title Patient will report improved walking tolerance sufficient to allow her to resume walking for exercise or attending community outings with her grandchildren   ? Status Achieved   09/12/21  ?  ? PT LONG TERM GOAL #6  ? Title Patient will be able to walk for exercise for at least 30 minutes for continued cardiovascular and muscular endurance w/o limitation due to R knee pain   ? Status Achieved   09/12/21 - walking 15-20 sometimes up to 30 minutes for exercise w/o limitation due to her knee  ? ?  ?  ? ?  ? ? ? ? ? ? ? ? Plan - 09/12/21 1603   ? ? Clinical Impression Statement Jean Hopkins is pleased with her progress noting >50% improvement in her R knee pain allowing for improved walking tolerance allowing for 15-20-minute and sometimes up to 30-minute walks for exercise, as well as better ability to keep up with her family on community outings. Her R knee AROM is now [facility] and her overall LE strength is now 4+/5 to 5/5. She reports she has been able to complete all of her HEP w/o issues other than noting need to not save step exercises for last, and denies need for further review. All goals met for this episode and Jean Hopkins feels confident with transitioning to her HEP, therefore will proceed with discharge from PT for this episode.   ? Comorbidities A-fib, GERD, pre-diabetes, peripheral neuropathy, borderline HTN, Raynaud?s phenomenon   ? Rehab Potential Good   ? PT Frequency 2x / week   ? PT Duration 4 weeks   ? PT Treatment/Interventions ADLs/Self Care Home Management;Cryotherapy;Electrical Stimulation;Iontophoresis 24m/ml Dexamethasone;Moist Heat;DME Instruction;Gait training;Stair training;Functional mobility training;Therapeutic activities;Therapeutic exercise;Balance training;Neuromuscular re-education;Patient/family education;Manual techniques;Passive range of motion;Dry needling;Taping;Vasopneumatic Device;Joint Manipulations   ? PT  Next Visit Plan Discharge with transition to  HEP   ? PT Home Exercise Plan Access Code: [email or web address]   ? Consulted and Agree with Plan of Care Patient   ? ?  ?  ? ?  ? ? ?Patient will benefit from skilled therapeutic intervention in order to improve the following deficits and impairments:  Abnormal gait, Decreased activity tolerance, Decreased balance, Decreased endurance, Decreased mobility, Decreased range of motion, Decreased strength, Difficulty walking, Increased fascial restricitons, Impaired flexibility, Impaired perceived functional ability, Pain ? ?Visit Diagnosis: ?Chronic pain of right knee ? ?Stiffness of right knee, not elsewhere classified ? ?Muscle weakness (generalized) ? ?Other abnormalities of gait and mobility ? ?Difficulty in walking, not elsewhere classified ? ? ? ? ?Problem List ?Patient Active Problem List  ? Diagnosis Date Noted  ? Hypertension 02/02/2021  ? Melanoma of skin ([place]) 02/02/2021  ? Raynaud's phenomenon 02/02/2021  ? Arthritis of right knee 01/14/2021  ? Gastroesophageal reflux disease without esophagitis 01/14/2021  ? Need for Tdap vaccination 01/14/2021  ? Osteoarthritis of right knee 01/04/2021  ? Hyperthyroidism   ? Atrial fibrillation ([place]) 07/29/2017  ? ? ? ?PHYSICAL THERAPY DISCHARGE SUMMARY ? ?Visits from Start of Care: 17 ? ?Current functional level related to goals / functional outcomes: ?  Refer to above clinical impression and goal assessment.  ?  ?Remaining deficits: ?  As above.  ?  ?Education / Equipment: ?  HEP  ? ?Patient agrees to discharge. Patient goals were met. Patient is being discharged due to meeting the stated rehab goals. ? ? ?Percival Spanish, PT ?09/12/2021, 4:12 PM ? ?[place] ?[facility] ?[address] ?[place], [place], [address] ?Phone: [phone]   Fax:  [phone] ? ?Name: Jean Hopkins ?MRN: [id number] ?Date of Birth: 01-Dec-1955 ? ? ? ?

## 2021-09-27 ENCOUNTER — Other Ambulatory Visit: Payer: Self-pay | Admitting: Endocrinology

## 2021-09-29 ENCOUNTER — Ambulatory Visit (INDEPENDENT_AMBULATORY_CARE_PROVIDER_SITE_OTHER): Payer: BC Managed Care – PPO | Admitting: Endocrinology

## 2021-09-29 ENCOUNTER — Encounter: Payer: Self-pay | Admitting: Endocrinology

## 2021-09-29 VITALS — BP 150/90 | HR 92 | Ht 63.0 in | Wt 232.8 lb

## 2021-09-29 DIAGNOSIS — E0590 Thyrotoxicosis, unspecified without thyrotoxic crisis or storm: Secondary | ICD-10-CM

## 2021-09-29 LAB — TSH: TSH: 1.68 u[IU]/mL (ref 0.35–5.50)

## 2021-09-29 LAB — T4, FREE: Free T4: 1.28 ng/dL (ref 0.60–1.60)

## 2021-09-29 NOTE — Progress Notes (Signed)
? ?Subjective:  ? ? Patient ID: Jean Hopkins, female    DOB: 23-Jun-1956, 66 y.o.   MRN: [id number] ? ?HPI ?Pt returns for f/u of hyperthyroidism (dx'ed early 2019, in eval of AF; [place] in 2020 showed multinodular thyroid, with no thyroid nodule meeting criteria for bx or f/u US; tapazole was chosen as initial rx, due to AF; it was stopped in [place], due to hypothyroidism; since then, she has been euthyroid off rx until early 2020, when it was resumed for recurrent hyperthyroidism; she declines RAI).  She has intermitt palpitations (she has d/w cardiol).  pt takes tapazole as rx'ed.   ?Past Medical History:  ?Diagnosis Date  ? AF (atrial fibrillation) ([place])   ? Allergy   ? Anemia   ? Arthritis 11/2020  ? Right knee  ? Blood transfusion without reported diagnosis   ? Cancer [facility])   ? Melanoma x2  ? Cataract   ? Hyperthyroidism   ? ? ?Past Surgical History:  ?Procedure Laterality Date  ? ABDOMINAL HYSTERECTOMY  2007  ? CHOLECYSTECTOMY  1988  ? ? ?Social History  ? ?Socioeconomic History  ? Marital status: Married  ?  Spouse name: Not on file  ? Number of children: Not on file  ? Years of education: Not on file  ? Highest education level: Not on file  ?Occupational History  ? Not on file  ?Tobacco Use  ? Smoking status: Former  ?  Packs/day: 0.50  ?  Years: 1.00  ?  Pack years: 0.50  ?  Types: Cigarettes  ?  Quit date: 10/24/1980  ?  Years since quitting: 40.9  ? Smokeless tobacco: Never  ?Vaping Use  ? Vaping Use: Never used  ?Substance and Sexual Activity  ? Alcohol use: Yes  ?  Comment: Once or twice a month  ? Drug use: No  ? Sexual activity: Yes  ?  Birth control/protection: Surgical  ?  Comment: hysterectomy  ?Other Topics Concern  ? Not on file  ?Social History Narrative  ? Not on file  ? ?Social Determinants of Health  ? ?Financial Resource Strain: Not on file  ?Food Insecurity: Not on file  ?Transportation Needs: Not on file  ?Physical Activity: Not on file  ?Stress: Not on file  ?Social Connections: Not on file   ?Intimate Partner Violence: Not on file  ? ? ?Current Outpatient Medications on File Prior to Visit  ?Medication Sig Dispense Refill  ? celecoxib (CELEBREX) 100 MG capsule Take 100 mg by mouth 2 (two) times daily.    ? Cholecalciferol (VITAMIN D PO) Take 5,000 Units by mouth daily.    ? methimazole (TAPAZOLE) 5 MG tablet TAKE 1/2 TABLET BY MOUTH DAILY 45 tablet 3  ? Omeprazole 20 MG TBEC Take 1 tablet by mouth daily.    ? ?No current facility-administered medications on file prior to visit.  ? ? ?No Known Allergies ? ?Family History  ?Problem Relation Age of Onset  ? Hypertension Mother   ? Hyperlipidemia Mother   ? Multiple sclerosis Mother   ? Osteoporosis Mother   ? Hyperlipidemia Father   ? Hypertension Father   ? Heart disease Father   ? Hypertension Brother   ? Hypertension Brother   ? Hypertension Brother   ? Diverticulitis Brother   ? Stomach cancer Paternal Grandmother   ? Breast cancer Paternal Aunt   ? Polycystic ovary syndrome Child   ? Heart disease Brother   ? Hypertension Brother   ? Hypertension Brother   ?  Thyroid disease Neg Hx   ? Colon cancer Neg Hx   ? Esophageal cancer Neg Hx   ? Rectal cancer Neg Hx   ? ? ?BP (!) 150/90 (BP Location: Left Arm, Patient Position: Sitting, Cuff Size: Normal)   Pulse 92   Ht '5\' 3"'$  (1.6 m)   Wt 232 lb 12.8 oz (105.6 kg)   SpO2 98%   BMI 41.24 kg/m?  ? ? ?Review of Systems ?Denies fever.   ?   ?Objective:  ? Physical Exam ?VITAL SIGNS:  See vs page. ?GENERAL: no distress.  ?NECK: There is no palpable thyroid enlargement.  No thyroid nodule is palpable.  No palpable lymphadenopathy at the anterior neck.   ? ? ?Lab Results  ?Component Value Date  ? TSH 1.68 09/29/2021  ? T4TOTAL 11.6 07/29/2017  ? ?   ?Assessment & Plan:  ?Hyperthyroidism: well-controlled.  Please continue the same methimazole. ? ?

## 2021-09-29 NOTE — Patient Instructions (Addendum)
Your blood pressure is high today.  Please see your primary care provider soon, to have it rechecked.   ?Blood tests are requested for you today.  We'll let you know about the results.   ?If ever you have fever while taking methimazole, stop it and call us, even if the reason is obvious, because of the risk of a rare side-effect.   ?It is best to never miss the medication.  However, if you do miss it, next best is to double up the next time.   ?You should have a follow-up appointment in 6 months.   ?

## 2021-10-17 ENCOUNTER — Encounter: Payer: Self-pay | Admitting: Medical-Surgical

## 2021-10-20 DIAGNOSIS — Z713 Dietary counseling and surveillance: Secondary | ICD-10-CM | POA: Diagnosis not present

## 2021-12-16 DIAGNOSIS — M1711 Unilateral primary osteoarthritis, right knee: Secondary | ICD-10-CM | POA: Diagnosis not present

## 2021-12-23 DIAGNOSIS — M1711 Unilateral primary osteoarthritis, right knee: Secondary | ICD-10-CM | POA: Diagnosis not present

## 2021-12-30 DIAGNOSIS — M1711 Unilateral primary osteoarthritis, right knee: Secondary | ICD-10-CM | POA: Diagnosis not present

## 2022-01-05 ENCOUNTER — Ambulatory Visit: Payer: BC Managed Care – PPO | Admitting: Endocrinology

## 2022-01-30 DIAGNOSIS — H43811 Vitreous degeneration, right eye: Secondary | ICD-10-CM | POA: Diagnosis not present

## 2022-02-15 DIAGNOSIS — R062 Wheezing: Secondary | ICD-10-CM | POA: Diagnosis not present

## 2022-02-15 DIAGNOSIS — J209 Acute bronchitis, unspecified: Secondary | ICD-10-CM | POA: Diagnosis not present

## 2022-02-20 ENCOUNTER — Encounter: Payer: Self-pay | Admitting: Medical-Surgical

## 2022-02-20 DIAGNOSIS — E119 Type 2 diabetes mellitus without complications: Secondary | ICD-10-CM

## 2022-02-20 DIAGNOSIS — Z0000 Encounter for general adult medical examination without abnormal findings: Secondary | ICD-10-CM

## 2022-02-20 DIAGNOSIS — E0590 Thyrotoxicosis, unspecified without thyrotoxic crisis or storm: Secondary | ICD-10-CM

## 2022-02-22 NOTE — Progress Notes (Unsigned)
   Complete physical exam  Patient: Jean Hopkins   DOB: 07/18/1955   66 y.o. Female  MRN: [id number]  Subjective:    No chief complaint on file.   Jean Hopkins is a 66 y.o. female who presents today for a complete physical exam. She reports consuming a {diet types:17450} diet. {types:19826} She generally feels {DESC; WELL/FAIRLY WELL/POORLY:18703}. She reports sleeping {DESC; WELL/FAIRLY WELL/POORLY:18703}. She {does/does not:200015} have additional problems to discuss today.    Most recent fall risk assessment:    02/17/2021    9:46 AM  Fall Risk   Falls in the past year? 0  Number falls in past yr: 0  Injury with Fall? 0  Risk for fall due to : No Fall Risks  Follow up Falls evaluation completed     Most recent depression screenings:    02/17/2021    9:46 AM 01/14/2021    1:22 PM  PHQ 2/9 Scores  PHQ - 2 Score 0 0  PHQ- 9 Score  0    {VISON DENTAL STD PSA (Optional):27386}  {History (Optional):23778}  Patient Care Team: Samuel Bouche, NP as PCP - General (Nurse Practitioner) Sanda Klein, MD as PCP - Cardiology (Cardiology)   Outpatient Medications Prior to Visit  Medication Sig   celecoxib (CELEBREX) 100 MG capsule Take 100 mg by mouth 2 (two) times daily.   Cholecalciferol (VITAMIN D PO) Take 5,000 Units by mouth daily.   methimazole (TAPAZOLE) 5 MG tablet TAKE 1/2 TABLET BY MOUTH DAILY   Omeprazole 20 MG TBEC Take 1 tablet by mouth daily.   No facility-administered medications prior to visit.    ROS        Objective:     There were no vitals taken for this visit. {Vitals History (Optional):23777}  Physical Exam   No results found for any visits on 02/23/22. {Show previous labs (optional):23779}    Assessment & Plan:    Routine Health Maintenance and Physical Exam  Immunization History  Administered Date(s) Administered   Influenza,inj,Quad PF,6+ Mos 03/27/2017   Influenza,inj,quad, With Preservative 03/27/2019   Influenza-Unspecified  03/06/2017, 03/29/2018, 04/01/2019   PFIZER(Purple Top)SARS-COV-2 Vaccination 07/09/2019, 07/30/2019, 04/07/2020   Tdap 01/14/2021    Health Maintenance  Topic Date Due   HIV Screening  Never done   Diabetic kidney evaluation - Urine ACR  Never done   Hepatitis C Screening  Never done   Zoster Vaccines- Shingrix (1 of 2) Never done   COVID-19 Vaccine (4 - Pfizer risk series) 06/02/2020   Diabetic kidney evaluation - GFR measurement  02/17/2022   INFLUENZA VACCINE  01/24/2022   MAMMOGRAM  05/18/2023   COLONOSCOPY (Pts 45-59yr Insurance coverage will need to be confirmed)  05/25/2026   TETANUS/TDAP  01/15/2031   HPV VACCINES  Aged Out   PAP SMEAR-Modifier  Discontinued    Discussed health benefits of physical activity, and encouraged her to engage in regular exercise appropriate for her age and condition.  Problem List Items Addressed This Visit       Cardiovascular and Mediastinum   Hypertension     Digestive   Gastroesophageal reflux disease without esophagitis     Endocrine   Hyperthyroidism   Other Visit Diagnoses     Annual physical exam    -  Primary      No follow-ups on file.     JSamuel Bouche NP

## 2022-02-23 ENCOUNTER — Ambulatory Visit (INDEPENDENT_AMBULATORY_CARE_PROVIDER_SITE_OTHER): Payer: BC Managed Care – PPO | Admitting: Medical-Surgical

## 2022-02-23 ENCOUNTER — Encounter: Payer: Self-pay | Admitting: Medical-Surgical

## 2022-02-23 VITALS — BP 134/81 | HR 76 | Resp 20 | Ht 63.0 in | Wt 208.2 lb

## 2022-02-23 DIAGNOSIS — Z0000 Encounter for general adult medical examination without abnormal findings: Secondary | ICD-10-CM

## 2022-02-23 DIAGNOSIS — K219 Gastro-esophageal reflux disease without esophagitis: Secondary | ICD-10-CM

## 2022-02-23 DIAGNOSIS — I10 Essential (primary) hypertension: Secondary | ICD-10-CM | POA: Diagnosis not present

## 2022-02-23 DIAGNOSIS — E0590 Thyrotoxicosis, unspecified without thyrotoxic crisis or storm: Secondary | ICD-10-CM

## 2022-02-23 DIAGNOSIS — E119 Type 2 diabetes mellitus without complications: Secondary | ICD-10-CM

## 2022-02-23 HISTORY — DX: Type 2 diabetes mellitus without complications: E11.9

## 2022-03-01 LAB — COMPLETE METABOLIC PANEL WITH GFR
AG Ratio: 1.5 (calc) (ref 1.0–2.5)
ALT: 17 U/L (ref 6–29)
AST: 16 U/L (ref 10–35)
Albumin: 3.9 g/dL (ref 3.6–5.1)
Alkaline phosphatase (APISO): 72 U/L (ref 37–153)
BUN: 12 mg/dL (ref 7–25)
CO2: 27 mmol/L (ref 20–32)
Calcium: 9.6 mg/dL (ref 8.6–10.4)
Chloride: 105 mmol/L (ref 98–110)
Creat: 0.76 mg/dL (ref 0.50–1.05)
Globulin: 2.6 g/dL (calc) (ref 1.9–3.7)
Glucose, Bld: 89 mg/dL (ref 65–99)
Potassium: 4.8 mmol/L (ref 3.5–5.3)
Sodium: 143 mmol/L (ref 135–146)
Total Bilirubin: 0.6 mg/dL (ref 0.2–1.2)
Total Protein: 6.5 g/dL (ref 6.1–8.1)
eGFR: 88 mL/min/{1.73_m2} (ref >=60)

## 2022-03-01 LAB — LIPID PANEL W/REFLEX DIRECT LDL
Cholesterol: 174 mg/dL (ref <200)
HDL: 42 mg/dL — ABNORMAL LOW (ref >=50)
LDL Cholesterol (Calc): 110 mg/dL (calc) — ABNORMAL HIGH
Non-HDL Cholesterol (Calc): 132 mg/dL (calc) — ABNORMAL HIGH (ref <130)
Total CHOL/HDL Ratio: 4.1 (calc) (ref <5.0)
Triglycerides: 108 mg/dL (ref <150)

## 2022-03-01 LAB — CBC WITH DIFFERENTIAL/PLATELET
Absolute Monocytes: 666 cells/uL (ref 200–950)
Basophils Absolute: 90 cells/uL (ref 0–200)
Basophils Relative: 1 %
Eosinophils Absolute: 162 cells/uL (ref 15–500)
Eosinophils Relative: 1.8 %
HCT: 47.9 % — ABNORMAL HIGH (ref 35.0–45.0)
Hemoglobin: 15.6 g/dL — ABNORMAL HIGH (ref 11.7–15.5)
Lymphs Abs: 2349 cells/uL (ref 850–3900)
MCH: 26.9 pg — ABNORMAL LOW (ref 27.0–33.0)
MCHC: 32.6 g/dL (ref 32.0–36.0)
MCV: 82.6 fL (ref 80.0–100.0)
MPV: 10 fL (ref 7.5–12.5)
Monocytes Relative: 7.4 %
Neutro Abs: 5733 cells/uL (ref 1500–7800)
Neutrophils Relative %: 63.7 %
Platelets: 326 10*3/uL (ref 140–400)
RBC: 5.8 10*6/uL — ABNORMAL HIGH (ref 3.80–5.10)
RDW: 14.3 % (ref 11.0–15.0)
Total Lymphocyte: 26.1 %
WBC: 9 10*3/uL (ref 3.8–10.8)

## 2022-03-01 LAB — SPECIMEN COMPROMISED

## 2022-03-01 LAB — HEMOGLOBIN A1C
Hgb A1c MFr Bld: 6.1 % of total Hgb — ABNORMAL HIGH (ref <5.7)
Mean Plasma Glucose: 128 mg/dL
eAG (mmol/L): 7.1 mmol/L

## 2022-03-01 LAB — MICROALBUMIN / CREATININE URINE RATIO
Creatinine, Urine: 24 mg/dL (ref 20–275)
Microalb, Ur: <0.2 mg/dL

## 2022-03-07 ENCOUNTER — Encounter: Payer: Self-pay | Admitting: Medical-Surgical

## 2022-03-08 DIAGNOSIS — Z85820 Personal history of malignant melanoma of skin: Secondary | ICD-10-CM | POA: Diagnosis not present

## 2022-03-08 DIAGNOSIS — L304 Erythema intertrigo: Secondary | ICD-10-CM | POA: Diagnosis not present

## 2022-03-08 DIAGNOSIS — D2261 Melanocytic nevi of right upper limb, including shoulder: Secondary | ICD-10-CM | POA: Diagnosis not present

## 2022-03-08 DIAGNOSIS — D2262 Melanocytic nevi of left upper limb, including shoulder: Secondary | ICD-10-CM | POA: Diagnosis not present

## 2022-04-03 ENCOUNTER — Encounter: Payer: Self-pay | Admitting: Internal Medicine

## 2022-04-03 ENCOUNTER — Ambulatory Visit (INDEPENDENT_AMBULATORY_CARE_PROVIDER_SITE_OTHER): Payer: BC Managed Care – PPO | Admitting: Internal Medicine

## 2022-04-03 VITALS — BP 132/80 | HR 79 | Ht 63.0 in | Wt 210.0 lb

## 2022-04-03 DIAGNOSIS — E042 Nontoxic multinodular goiter: Secondary | ICD-10-CM | POA: Diagnosis not present

## 2022-04-03 DIAGNOSIS — E0590 Thyrotoxicosis, unspecified without thyrotoxic crisis or storm: Secondary | ICD-10-CM | POA: Diagnosis not present

## 2022-04-03 LAB — TSH: TSH: 1.82 u[IU]/mL (ref 0.35–5.50)

## 2022-04-03 LAB — T4, FREE: Free T4: 1.06 ng/dL (ref 0.60–1.60)

## 2022-04-03 MED ORDER — METHIMAZOLE 5 MG PO TABS: 5.0000 mg | ORAL_TABLET | ORAL | 3 refills | Status: DC

## 2022-04-03 NOTE — Progress Notes (Signed)
Name: Jean Hopkins  MRN/ DOB: [id number], 1956-05-30    Age/ Sex: 66 y.o., female     PCP: Samuel Bouche, NP   Reason for Endocrinology Evaluation: Hyperthyroidism     Initial Endocrinology Clinic Visit: 08/15/2017    PATIENT IDENTIFIER: Jean Hopkins is a 66 y.o., female with a past medical history of Hyperthyroidism, MNG and A.Fib . She has followed with [facility] since 08/15/2017 for consultative assistance with management of her Hyperthyroidism.   HISTORICAL SUMMARY: The patient was first diagnosed with hyperthyroidism in 2019 with suppressed TSH <0.006 uIU/mL and elevated free T4 at 2.68 NG/DL  She was started on methimazole until May 2019 due to the development of hypothyroid, methimazole was resumed in 2020 with a TSH of 0.03 uIU/mL   Thyroid ultrasound 12/2018 revealed multinodular goiter with 9 of no nodules meeting FNA no continue surveillance criteria.  She declines  RAI ablation    Mother with Hypothyroidism   SUBJECTIVE:    Today (04/03/2022):  Jean Hopkins is here for for follow-up on hyperthyroidism.  Weight has been fluctuating  Denies constipation or diarrhea  Denies palpitations  Denies tremors  No prior RX exposure  No Biotin   Methimazole 5 mg every other day   HISTORY:  Past Medical History:  Past Medical History:  Diagnosis Date   AF (atrial fibrillation) ([place])    Allergy    Anemia    Arthritis 11/2020   Right knee   Blood transfusion without reported diagnosis    Cancer ([place])    Melanoma x2   Cataract    Controlled type 2 diabetes mellitus without complication, without long-term current use of insulin ([place]) 02/23/2022   Hyperthyroidism    Past Surgical History:  Past Surgical History:  Procedure Laterality Date   ABDOMINAL HYSTERECTOMY  2007   CHOLECYSTECTOMY  1988   TOTAL ABDOMINAL HYSTERECTOMY     Social History:  reports that she quit smoking about 41 years ago. Her smoking use included cigarettes. She has a 0.50  pack-year smoking history. She has never used smokeless tobacco. She reports current alcohol use. She reports that she does not use drugs. Family History:  Family History  Problem Relation Age of Onset   Hypertension Mother    Hyperlipidemia Mother    Multiple sclerosis Mother    Osteoporosis Mother    Hyperlipidemia Father    Hypertension Father    Heart disease Father    Hypertension Brother    Hypertension Brother    Hypertension Brother    Diverticulitis Brother    Stomach cancer Paternal Grandmother    Breast cancer Paternal Aunt    Polycystic ovary syndrome Child    Heart disease Brother    Hypertension Brother    Hypertension Brother    Thyroid disease Neg Hx    Colon cancer Neg Hx    Esophageal cancer Neg Hx    Rectal cancer Neg Hx      HOME MEDICATIONS: Allergies as of 04/03/2022   No Known Allergies      Medication List        Accurate as of April 03, 2022  8:21 AM. If you have any questions, ask your nurse or doctor.          celecoxib 100 MG capsule Commonly known as: CELEBREX Take 100 mg by mouth 2 (two) times daily.   methimazole 5 MG tablet Commonly known as: TAPAZOLE TAKE 1/2 TABLET BY MOUTH DAILY What changed:  how much  to take how to take this when to take this additional instructions   Omeprazole 20 MG Tbec Take 1 tablet by mouth daily.   VITAMIN D PO Take 5,000 Units by mouth daily.          OBJECTIVE:   PHYSICAL EXAM: VS: BP 132/80 (BP Location: Left Arm, Patient Position: Sitting, Cuff Size: Large)   Pulse 79   Ht 5' 3"  (1.6 m)   Wt 210 lb (95.3 kg)   SpO2 99%   BMI 37.20 kg/m    EXAM: General: Pt appears well and is in NAD  Neck: General: Supple without adenopathy. Thyroid: Thyroid size normal.  No goiter or nodules appreciated.   Lungs: Clear with good BS bilat with no rales, rhonchi, or wheezes  Heart: Auscultation: RRR.  Abdomen: Normoactive bowel sounds, soft, nontender, without masses or organomegaly  palpable  Extremities:  BL LE: No pretibial edema normal ROM and strength.  Mental Status: Judgment, insight: Intact Orientation: Oriented to time, place, and person Mood and affect: No depression, anxiety, or agitation     DATA REVIEWED:  Latest Reference Range & Units 04/03/22 08:37  TSH 0.35 - 5.50 uIU/mL 1.82  T4,Free(Direct) 0.60 - 1.60 ng/dL 1.06     Latest Reference Range & Units 02/23/22 09:22  Sodium 135 - 146 mmol/L 143  Potassium 3.5 - 5.3 mmol/L 4.8  Chloride 98 - 110 mmol/L 105  CO2 20 - 32 mmol/L 27  Glucose 65 - 99 mg/dL 89  Mean Plasma Glucose mg/dL 128  BUN 7 - 25 mg/dL 12  Creatinine 0.50 - 1.05 mg/dL 0.76  Calcium 8.6 - 10.4 mg/dL 9.6  BUN/Creatinine Ratio 6 - 22 (calc) SEE NOTE:  eGFR > OR = 60 mL/min/1.63m 88  AG Ratio 1.0 - 2.5 (calc) 1.5  AST 10 - 35 U/L 16  ALT 6 - 29 U/L 17  Total Protein 6.1 - 8.1 g/dL 6.5  Total Bilirubin 0.2 - 1.2 mg/dL 0.6   Old records , labs and images have been reviewed.   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 - Tolerating methimazole without side effects, she would like to stay on this rather than RAI or surgery  -TFTs today are normal  Medications  Continue methimazole 5 mg every other day      2.MNG:   -This was based on thyroid ultrasound from 2020 -All nodules were subcentimeter, none meeting FNA criteria nor continued surveillance - No local symptoms    F/U in 6 months   Signed electronically by: AMack Guise MD  [facility]  [facility] [address] [place] [place] [address]Phone: [phone]: [phone]     CC: JSamuel Bouche [place]Phone: [phone] Fax: [phone]  Return to Endocrinology clinic as below: Future Appointments  Date Time Provider [place] 08/25/2022   8:10 AM JSamuel Bouche NP [facility] None

## 2022-05-23 ENCOUNTER — Other Ambulatory Visit: Payer: Self-pay | Admitting: Medical-Surgical

## 2022-05-23 DIAGNOSIS — Z1231 Encounter for screening mammogram for malignant neoplasm of breast: Secondary | ICD-10-CM

## 2022-05-25 ENCOUNTER — Ambulatory Visit (INDEPENDENT_AMBULATORY_CARE_PROVIDER_SITE_OTHER): Payer: BC Managed Care – PPO

## 2022-05-25 DIAGNOSIS — Z1231 Encounter for screening mammogram for malignant neoplasm of breast: Secondary | ICD-10-CM

## 2022-06-20 DIAGNOSIS — M1711 Unilateral primary osteoarthritis, right knee: Secondary | ICD-10-CM | POA: Diagnosis not present

## 2022-08-09 DIAGNOSIS — H43811 Vitreous degeneration, right eye: Secondary | ICD-10-CM | POA: Diagnosis not present

## 2022-08-09 DIAGNOSIS — H25043 Posterior subcapsular polar age-related cataract, bilateral: Secondary | ICD-10-CM | POA: Diagnosis not present

## 2022-08-09 DIAGNOSIS — H5213 Myopia, bilateral: Secondary | ICD-10-CM | POA: Diagnosis not present

## 2022-08-09 DIAGNOSIS — H2513 Age-related nuclear cataract, bilateral: Secondary | ICD-10-CM | POA: Diagnosis not present

## 2022-08-09 LAB — HM DIABETES EYE EXAM

## 2022-08-25 ENCOUNTER — Encounter: Payer: Self-pay | Admitting: Medical-Surgical

## 2022-08-25 ENCOUNTER — Ambulatory Visit (INDEPENDENT_AMBULATORY_CARE_PROVIDER_SITE_OTHER): Payer: BC Managed Care – PPO | Admitting: Medical-Surgical

## 2022-08-25 VITALS — BP 146/78 | HR 74 | Resp 20 | Ht 63.0 in | Wt 216.1 lb

## 2022-08-25 DIAGNOSIS — I10 Essential (primary) hypertension: Secondary | ICD-10-CM | POA: Diagnosis not present

## 2022-08-25 DIAGNOSIS — E7800 Pure hypercholesterolemia, unspecified: Secondary | ICD-10-CM

## 2022-08-25 DIAGNOSIS — K219 Gastro-esophageal reflux disease without esophagitis: Secondary | ICD-10-CM

## 2022-08-25 DIAGNOSIS — I480 Paroxysmal atrial fibrillation: Secondary | ICD-10-CM | POA: Diagnosis not present

## 2022-08-25 DIAGNOSIS — Z85820 Personal history of malignant melanoma of skin: Secondary | ICD-10-CM | POA: Insufficient documentation

## 2022-08-25 DIAGNOSIS — Z8679 Personal history of other diseases of the circulatory system: Secondary | ICD-10-CM | POA: Insufficient documentation

## 2022-08-25 DIAGNOSIS — E119 Type 2 diabetes mellitus without complications: Secondary | ICD-10-CM

## 2022-08-25 DIAGNOSIS — E559 Vitamin D deficiency, unspecified: Secondary | ICD-10-CM | POA: Insufficient documentation

## 2022-08-25 DIAGNOSIS — C439 Malignant melanoma of skin, unspecified: Secondary | ICD-10-CM

## 2022-08-25 HISTORY — DX: Pure hypercholesterolemia, unspecified: E78.00

## 2022-08-25 LAB — POCT GLYCOSYLATED HEMOGLOBIN (HGB A1C): Hemoglobin A1C: 6.5 % — AB (ref 4.0–5.6)

## 2022-08-25 LAB — POCT UA - MICROALBUMIN
Creatinine, POC: 10 mg/dL
Microalbumin Ur, POC: 10 mg/L

## 2022-08-25 NOTE — Progress Notes (Signed)
Established Patient Office Visit  Subjective   Patient ID: Jean Hopkins, female   DOB: [birth year] Age: 67 y.o. MRN: [id number]   Chief Complaint  Patient presents with   Follow-up   Diabetes   HPI Pleasant 67 year old female presenting today for diabetes follow-up.  She has been doing well since her last visit here.  Her previous hemoglobin A1c was 6.2%.  She is not checking sugars at home and is not currently taking any medications for management of diabetes.  She has been working on getting exercise and is walking as tolerated about 1 mile per day.  She does have difficulty with exercise due to a need for a total knee replacement on the right.  Working on dietary modifications with a goal for weight loss to healthy weight.  Hypertension: Not currently treated with any medications and not regularly checking blood pressure.  Does follow a low-sodium diet.  Exercise as noted above. Denies CP, SOB, palpitations, lower extremity edema, dizziness, headaches, or vision changes.  GERD: Taking omeprazole 20 mg daily, tolerating well without side effects.  Feels her symptoms are well controlled on the medication.   Objective:    Vitals:   08/25/22 0822  BP: (!) 144/82  Pulse: 74  Resp: 20  Height: '5\' 3"'$  (1.6 m)  Weight: 216 lb 1.6 oz (98 kg)  SpO2: 100%  BMI (Calculated): 38.29   Physical Exam Vitals reviewed.  Constitutional:      General: She is not in acute distress.    Appearance: Normal appearance. She is not ill-appearing.  HENT:     Head: Normocephalic and atraumatic.  Cardiovascular:     Rate and Rhythm: Normal rate and regular rhythm.     Pulses: Normal pulses.     Heart sounds: Normal heart sounds.  Pulmonary:     Effort: Pulmonary effort is normal. No respiratory distress.     Breath sounds: Normal breath sounds. No wheezing, rhonchi or rales.  Skin:    General: Skin is warm and dry.  Neurological:     Mental Status: She is alert and oriented to person, place, and  time.  Psychiatric:        Mood and Affect: Mood normal.        Behavior: Behavior normal.        Thought Content: Thought content normal.        Judgment: Judgment normal.   No results found for this or any previous visit (from the past 24 hour(s)).     The 10-year ASCVD risk score (Arnett DK, et al., 2019) is: 12.3%   Values used to calculate the score:     Age: 29 years     Sex: Female     Is Non-Hispanic African American: No     Diabetic: Yes     Tobacco smoker: No     Systolic Blood Pressure: 123456 mmHg     Is BP treated: No     HDL Cholesterol: 42 mg/dL     Total Cholesterol: 174 mg/dL   Assessment & Plan:   1. Controlled type 2 diabetes mellitus without complication, without long-term current use of insulin ([facility]) POCT hemoglobin A1c 6.5% today.  Urine microalbumin checked today showing abnormal.  Up-to-date on diabetic eye care.  Foot exam completed today.  Not currently on ACE/ARB or statin.  Remains hesitant to consider adding medication at this time.  Would like to work on lifestyle and dietary modifications first. - POCT UA - Microalbumin  2.  Primary hypertension Blood pressure elevated on arrival and again on recheck.  Strongly recommend working on diet, exercise, and weight loss.  Discussed adding a low-dose of an ACE or an ARB however she is hesitant to start medications today.  She would like 6 months to work on diet, exercise, and weight loss and if still elevated at that time, will be amenable to restarting medication.  3. Gastroesophageal reflux disease without esophagitis Stable.  Continue omeprazole 20 mg daily.  4. Paroxysmal atrial fibrillation ([place]) Resolved issue.  She had this issue when her thyroid function was abnormal and since this has been stabilized on methimazole, she no further issues with A-fib.  5. Melanoma of skin ([place]) Had 2 lesions removed about 10 years ago.  Follows up with dermatology regularly for full-body skin checks.  Return in about  6 months (around 02/25/2023) for DM/HTN follow up.  ___________________________________________ Clearnce Sorrel, DNP, APRN, FNP-BC Primary Care and [place]

## 2022-09-26 DIAGNOSIS — M1711 Unilateral primary osteoarthritis, right knee: Secondary | ICD-10-CM | POA: Diagnosis not present

## 2022-10-03 DIAGNOSIS — M1711 Unilateral primary osteoarthritis, right knee: Secondary | ICD-10-CM | POA: Diagnosis not present

## 2022-10-09 ENCOUNTER — Ambulatory Visit (INDEPENDENT_AMBULATORY_CARE_PROVIDER_SITE_OTHER): Payer: BC Managed Care – PPO | Admitting: Internal Medicine

## 2022-10-09 ENCOUNTER — Encounter: Payer: Self-pay | Admitting: Internal Medicine

## 2022-10-09 VITALS — BP 116/72 | HR 73 | Ht 63.0 in | Wt 218.0 lb

## 2022-10-09 DIAGNOSIS — E042 Nontoxic multinodular goiter: Secondary | ICD-10-CM | POA: Diagnosis not present

## 2022-10-09 DIAGNOSIS — E0590 Thyrotoxicosis, unspecified without thyrotoxic crisis or storm: Secondary | ICD-10-CM

## 2022-10-09 LAB — T4, FREE: Free T4: 1.23 ng/dL (ref 0.60–1.60)

## 2022-10-09 LAB — T3: T3, Total: 98 ng/dL (ref 76–181)

## 2022-10-09 LAB — TSH: TSH: 1.81 u[IU]/mL (ref 0.35–5.50)

## 2022-10-09 MED ORDER — METHIMAZOLE 5 MG PO TABS: 5.0000 mg | ORAL_TABLET | ORAL | 3 refills | Status: DC

## 2022-10-09 NOTE — Progress Notes (Signed)
Name: Jean Hopkins  MRN/ DOB: [id number], [birth year]    Age/ Sex: 67 y.o., female     PCP: Christen Butter, NP   Reason for Endocrinology Evaluation: Hyperthyroidism     Initial Endocrinology Clinic Visit: 08/15/2017    PATIENT IDENTIFIER: Jean Hopkins is a 67 y.o., female with a past medical history of Hyperthyroidism, MNG and A.Fib . She has followed with [facility] since 08/15/2017 for consultative assistance with management of her Hyperthyroidism.   HISTORICAL SUMMARY: The patient was first diagnosed with hyperthyroidism in 2019 with suppressed TSH <0.006 uIU/mL and elevated free T4 at 2.68 NG/DL  She was started on methimazole until May 2019 due to the development of hyperthyroid, methimazole was resumed in 2020 with a TSH of 0.03 uIU/mL   Thyroid ultrasound 12/2018 revealed multinodular goiter with none of the nodules meeting FNA, no continued surveillance was recommended  She declines  RAI ablation    Mother with Hypothyroidism   SUBJECTIVE:    Today (10/09/2022):  Jean Hopkins is here for for follow-up on hyperthyroidism.  Weight has been trending up  Denies local neck swelling  Denies constipation or diarrhea  Denies palpitations  No Biotin    Methimazole 5 mg every other day   HISTORY:  Past Medical History:  Past Medical History:  Diagnosis Date   AF (atrial fibrillation)    Allergy    Anemia    Arthritis 11/2020   Right knee   Blood transfusion without reported diagnosis    Cancer    Melanoma x2   Cataract    Controlled type 2 diabetes mellitus without complication, without long-term current use of insulin 02/23/2022   Hypercholesterolemia 08/25/2022   Hyperthyroidism    Past Surgical History:  Past Surgical History:  Procedure Laterality Date   ABDOMINAL HYSTERECTOMY  2007   CHOLECYSTECTOMY  1988   TOTAL ABDOMINAL HYSTERECTOMY     Social History:  reports that she quit smoking about 41 years ago. Her smoking use included cigarettes. She  has a 0.50 pack-year smoking history. She has never used smokeless tobacco. She reports current alcohol use. She reports that she does not use drugs. Family History:  Family History  Problem Relation Age of Onset   Hypertension Mother    Hyperlipidemia Mother    Multiple sclerosis Mother    Osteoporosis Mother    Hyperlipidemia Father    Hypertension Father    Heart disease Father    Hypertension Brother    Hypertension Brother    Hypertension Brother    Diverticulitis Brother    Stomach cancer Paternal Grandmother    Breast cancer Paternal Aunt    Polycystic ovary syndrome Child    Heart disease Brother    Hypertension Brother    Hypertension Brother    Thyroid disease Neg Hx    Colon cancer Neg Hx    Esophageal cancer Neg Hx    Rectal cancer Neg Hx      HOME MEDICATIONS: Allergies as of 10/09/2022   No Known Allergies      Medication List        Accurate as of October 09, 2022  8:12 AM. If you have any questions, ask your nurse or doctor.          celecoxib 100 MG capsule Commonly known as: CELEBREX Take 100 mg by mouth 2 (two) times daily.   methimazole 5 MG tablet Commonly known as: TAPAZOLE Take 1 tablet (5 mg total) by mouth as directed.  Every other day   Omeprazole 20 MG Tbec Take 1 tablet by mouth daily.   VITAMIN D PO Take 5,000 Units by mouth daily.          OBJECTIVE:   PHYSICAL EXAM: VS: BP 116/72 (BP Location: Left Arm, Patient Position: Sitting, Cuff Size: Large)   Pulse 73   Ht 5\' 3"  (1.6 m)   Wt 218 lb (98.9 kg)   SpO2 99%   BMI 38.62 kg/m    EXAM: General: Pt appears well and is in NAD  Neck: General: Supple without adenopathy. Thyroid: Thyroid size normal.  No goiter or nodules appreciated.   Lungs: Clear with good BS bilat with no rales, rhonchi, or wheezes  Heart: Auscultation: RRR.  Abdomen: Normoactive bowel sounds, soft, nontender, without masses or organomegaly palpable  Extremities:  BL LE: No pretibial edema  normal ROM and strength.  Mental Status: Judgment, insight: Intact Orientation: Oriented to time, place, and person Mood and affect: No depression, anxiety, or agitation     DATA REVIEWED:  Latest Reference Range & Units 10/09/22 08:28  TSH 0.35 - 5.50 uIU/mL 1.81  T4,Free(Direct) 0.60 - 1.60 ng/dL 6.55      Latest Reference Range & Units 02/23/22 09:22  Sodium 135 - 146 mmol/L 143  Potassium 3.5 - 5.3 mmol/L 4.8  Chloride 98 - 110 mmol/L 105  CO2 20 - 32 mmol/L 27  Glucose 65 - 99 mg/dL 89  Mean Plasma Glucose mg/dL 374  BUN 7 - 25 mg/dL 12  Creatinine 8.27 - 0.78 mg/dL 6.75  Calcium 8.6 - 44.9 mg/dL 9.6  BUN/Creatinine Ratio 6 - 22 (calc) SEE NOTE:  eGFR > OR = 60 mL/min/1.68m2 88  AG Ratio 1.0 - 2.5 (calc) 1.5  AST 10 - 35 U/L 16  ALT 6 - 29 U/L 17  Total Protein 6.1 - 8.1 g/dL 6.5  Total Bilirubin 0.2 - 1.2 mg/dL 0.6     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 [place], [place] [address] Phone: [phone] FAX: [phone]      CC: Christen Butter, NP [address] [place] [address] Phone: [phone]  Fax: [phone]   Return to Endocrinology clinic as below: Future Appointments  Date Time Provider Department Center  03/01/2023  8:30 AM Christen Butter, NP [facility] None

## 2022-10-10 DIAGNOSIS — M1711 Unilateral primary osteoarthritis, right knee: Secondary | ICD-10-CM | POA: Diagnosis not present

## 2022-11-06 DIAGNOSIS — Z85820 Personal history of malignant melanoma of skin: Secondary | ICD-10-CM | POA: Diagnosis not present

## 2022-11-06 DIAGNOSIS — D2261 Melanocytic nevi of right upper limb, including shoulder: Secondary | ICD-10-CM | POA: Diagnosis not present

## 2022-11-06 DIAGNOSIS — L821 Other seborrheic keratosis: Secondary | ICD-10-CM | POA: Diagnosis not present

## 2022-11-06 DIAGNOSIS — D225 Melanocytic nevi of trunk: Secondary | ICD-10-CM | POA: Diagnosis not present

## 2022-12-19 ENCOUNTER — Encounter: Payer: Self-pay | Admitting: Medical-Surgical

## 2023-03-01 ENCOUNTER — Encounter: Payer: Self-pay | Admitting: Medical-Surgical

## 2023-03-01 ENCOUNTER — Ambulatory Visit (INDEPENDENT_AMBULATORY_CARE_PROVIDER_SITE_OTHER): Payer: BC Managed Care – PPO | Admitting: Medical-Surgical

## 2023-03-01 VITALS — BP 134/85 | HR 76 | Resp 20 | Ht 63.0 in | Wt 233.0 lb

## 2023-03-01 DIAGNOSIS — E119 Type 2 diabetes mellitus without complications: Secondary | ICD-10-CM

## 2023-03-01 DIAGNOSIS — E0590 Thyrotoxicosis, unspecified without thyrotoxic crisis or storm: Secondary | ICD-10-CM

## 2023-03-01 DIAGNOSIS — I10 Essential (primary) hypertension: Secondary | ICD-10-CM

## 2023-03-01 DIAGNOSIS — E7800 Pure hypercholesterolemia, unspecified: Secondary | ICD-10-CM

## 2023-03-01 DIAGNOSIS — E559 Vitamin D deficiency, unspecified: Secondary | ICD-10-CM

## 2023-03-01 LAB — POCT GLYCOSYLATED HEMOGLOBIN (HGB A1C): Hemoglobin A1C: 6.2 % — AB (ref 4.0–5.6)

## 2023-03-01 MED ORDER — HYDROCHLOROTHIAZIDE 12.5 MG PO TABS
12.5000 mg | ORAL_TABLET | Freq: Every day | ORAL | 1 refills | Status: DC
Start: 2023-03-01 — End: 2023-08-11

## 2023-03-01 NOTE — Progress Notes (Signed)
        Established patient visit  History, exam, impression, and plan:  1. Controlled type 2 diabetes mellitus without complication, without long-term current use of insulin [facility]) Very pleasant 67 year old female presenting today for follow-up on type 2 diabetes.  Her last A1c was checked in March with result of 6.5%.  She is not currently taking any medications and has been working to make dietary modifications.  Would like to lose weight but notes this has been a very slow process without much benefit so far.  Not interested in injectables such as Ozempic or Mounjaro at this time.  Not checking sugars at home and reports she does not have a glucometer.  No recent symptoms of hypoglycemia.  Offered a prescription for glucometer but she declined.  Recheck of hemoglobin A1c at 6.1% today indicating good control with dietary and lifestyle measures. - POCT HgB A1C  2. Hyperthyroidism Managed by endocrinology and up-to-date on appointments.  3. Primary hypertension History of hypertension previously treated with hydrochlorothiazide.  She was able to lose weight and came off the medication.  Unfortunately, the weight has come back on and her blood pressures have been running high.  She checks this occasionally with readings in the 130s-140s/80s.  Denies concerning symptoms today.  Cardiopulmonary exam normal.  Checking labs as below.  Restarting HCTZ 12.5 mg daily.  She will monitor blood pressure at home and let me know how this is going in a couple of weeks due to scheduling conflict for a nurse visit. - CBC with Differential/Platelet - CMP14+EGFR - Lipid panel  4. Vitamin D deficiency Currently taking vitamin D 5000 units daily.  Rechecking vitamin D today. - VITAMIN D 25 Hydroxy (Vit-D Deficiency, Fractures)  5. Hypercholesterolemia Rechecking lipid panel today. - Lipid panel   Procedures performed this visit: None.  Return in about 6 months (around 08/29/2023) for chronic disease follow  up.  __________________________________ Thayer Ohm, DNP, APRN, FNP-BC Primary Care and Sports Medicine [facility]

## 2023-03-02 LAB — CMP14+EGFR
ALT: 17 IU/L (ref 0–32)
AST: 15 IU/L (ref 0–40)
Albumin: 4.1 g/dL (ref 3.9–4.9)
Alkaline Phosphatase: 77 IU/L (ref 44–121)
BUN/Creatinine Ratio: 16 (ref 12–28)
BUN: 14 mg/dL (ref 8–27)
Bilirubin Total: 0.4 mg/dL (ref 0.0–1.2)
CO2: 26 mmol/L (ref 20–29)
Calcium: 9.4 mg/dL (ref 8.7–10.3)
Chloride: 102 mmol/L (ref 96–106)
Creatinine, Ser: 0.89 mg/dL (ref 0.57–1.00)
Globulin, Total: 2.3 g/dL (ref 1.5–4.5)
Glucose: 92 mg/dL (ref 70–99)
Potassium: 4.7 mmol/L (ref 3.5–5.2)
Sodium: 141 mmol/L (ref 134–144)
Total Protein: 6.4 g/dL (ref 6.0–8.5)
eGFR: 72 mL/min/{1.73_m2} (ref >59)

## 2023-03-02 LAB — CBC WITH DIFFERENTIAL/PLATELET
Basophils Absolute: 0.1 10*3/uL (ref 0.0–0.2)
Basos: 1 %
EOS (ABSOLUTE): 0.1 10*3/uL (ref 0.0–0.4)
Eos: 1 %
Hematocrit: 46.4 % (ref 34.0–46.6)
Hemoglobin: 15.1 g/dL (ref 11.1–15.9)
Immature Grans (Abs): 0 10*3/uL (ref 0.0–0.1)
Immature Granulocytes: 0 %
Lymphocytes Absolute: 2.3 10*3/uL (ref 0.7–3.1)
Lymphs: 30 %
MCH: 27 pg (ref 26.6–33.0)
MCHC: 32.5 g/dL (ref 31.5–35.7)
MCV: 83 fL (ref 79–97)
Monocytes Absolute: 0.7 10*3/uL (ref 0.1–0.9)
Monocytes: 9 %
Neutrophils Absolute: 4.5 10*3/uL (ref 1.4–7.0)
Neutrophils: 59 %
Platelets: 286 10*3/uL (ref 150–450)
RBC: 5.59 x10E6/uL — ABNORMAL HIGH (ref 3.77–5.28)
RDW: 12.3 % (ref 11.7–15.4)
WBC: 7.6 10*3/uL (ref 3.4–10.8)

## 2023-03-02 LAB — LIPID PANEL
Chol/HDL Ratio: 3.3 ratio (ref 0.0–4.4)
Cholesterol, Total: 179 mg/dL (ref 100–199)
HDL: 55 mg/dL (ref >39)
LDL Chol Calc (NIH): 109 mg/dL — ABNORMAL HIGH (ref 0–99)
Triglycerides: 79 mg/dL (ref 0–149)
VLDL Cholesterol Cal: 15 mg/dL (ref 5–40)

## 2023-03-02 LAB — VITAMIN D 25 HYDROXY (VIT D DEFICIENCY, FRACTURES): Vit D, 25-Hydroxy: 37.7 ng/mL (ref 30.0–100.0)

## 2023-03-16 ENCOUNTER — Encounter: Payer: Self-pay | Admitting: Medical-Surgical

## 2023-04-20 ENCOUNTER — Other Ambulatory Visit: Payer: Self-pay | Admitting: Medical Genetics

## 2023-04-20 DIAGNOSIS — Z006 Encounter for examination for normal comparison and control in clinical research program: Secondary | ICD-10-CM

## 2023-05-18 ENCOUNTER — Other Ambulatory Visit: Payer: Self-pay | Admitting: Medical-Surgical

## 2023-05-18 DIAGNOSIS — Z1231 Encounter for screening mammogram for malignant neoplasm of breast: Secondary | ICD-10-CM

## 2023-05-30 ENCOUNTER — Ambulatory Visit: Payer: BC Managed Care – PPO

## 2023-05-30 DIAGNOSIS — Z1231 Encounter for screening mammogram for malignant neoplasm of breast: Secondary | ICD-10-CM | POA: Diagnosis not present

## 2023-06-11 ENCOUNTER — Other Ambulatory Visit (HOSPITAL_COMMUNITY)
Admission: RE | Admit: 2023-06-11 | Discharge: 2023-06-11 | Disposition: A | Discharge status: Home / Self Care | Payer: Self-pay | Source: Ambulatory Visit | Attending: Oncology | Admitting: Oncology

## 2023-06-11 DIAGNOSIS — Z006 Encounter for examination for normal comparison and control in clinical research program: Secondary | ICD-10-CM

## 2023-06-25 LAB — GENECONNECT MOLECULAR SCREEN: Genetic Analysis Overall Interpretation: NEGATIVE

## 2023-07-04 DIAGNOSIS — M1711 Unilateral primary osteoarthritis, right knee: Secondary | ICD-10-CM | POA: Diagnosis not present

## 2023-07-09 ENCOUNTER — Encounter: Payer: Self-pay | Admitting: Internal Medicine

## 2023-07-09 ENCOUNTER — Encounter: Payer: Self-pay | Admitting: Medical-Surgical

## 2023-07-23 DIAGNOSIS — D225 Melanocytic nevi of trunk: Secondary | ICD-10-CM | POA: Diagnosis not present

## 2023-07-23 DIAGNOSIS — D2262 Melanocytic nevi of left upper limb, including shoulder: Secondary | ICD-10-CM | POA: Diagnosis not present

## 2023-07-23 DIAGNOSIS — L821 Other seborrheic keratosis: Secondary | ICD-10-CM | POA: Diagnosis not present

## 2023-07-23 DIAGNOSIS — D2261 Melanocytic nevi of right upper limb, including shoulder: Secondary | ICD-10-CM | POA: Diagnosis not present

## 2023-07-23 DIAGNOSIS — D485 Neoplasm of uncertain behavior of skin: Secondary | ICD-10-CM | POA: Diagnosis not present

## 2023-07-23 DIAGNOSIS — Z85820 Personal history of malignant melanoma of skin: Secondary | ICD-10-CM | POA: Diagnosis not present

## 2023-08-09 ENCOUNTER — Other Ambulatory Visit: Payer: Self-pay | Admitting: Medical-Surgical

## 2023-08-13 DIAGNOSIS — H5213 Myopia, bilateral: Secondary | ICD-10-CM | POA: Diagnosis not present

## 2023-08-13 DIAGNOSIS — H2513 Age-related nuclear cataract, bilateral: Secondary | ICD-10-CM | POA: Diagnosis not present

## 2023-08-13 LAB — HM DIABETES EYE EXAM

## 2023-08-15 ENCOUNTER — Encounter: Payer: Self-pay | Admitting: Medical-Surgical

## 2023-08-15 ENCOUNTER — Ambulatory Visit (INDEPENDENT_AMBULATORY_CARE_PROVIDER_SITE_OTHER): Payer: BC Managed Care – PPO | Admitting: Medical-Surgical

## 2023-08-15 VITALS — BP 137/78 | HR 69 | Resp 20 | Ht 63.0 in | Wt 231.6 lb

## 2023-08-15 DIAGNOSIS — I10 Essential (primary) hypertension: Secondary | ICD-10-CM

## 2023-08-15 DIAGNOSIS — Z01818 Encounter for other preprocedural examination: Secondary | ICD-10-CM | POA: Diagnosis not present

## 2023-08-15 DIAGNOSIS — E119 Type 2 diabetes mellitus without complications: Secondary | ICD-10-CM | POA: Diagnosis not present

## 2023-08-15 DIAGNOSIS — Z780 Asymptomatic menopausal state: Secondary | ICD-10-CM | POA: Diagnosis not present

## 2023-08-15 LAB — POCT UA - MICROALBUMIN
Creatinine, POC: 50 mg/dL
Microalbumin Ur, POC: 10 mg/L

## 2023-08-15 LAB — POCT GLYCOSYLATED HEMOGLOBIN (HGB A1C)
HbA1c, POC (controlled diabetic range): 6.8 % (ref 0.0–7.0)
Hemoglobin A1C: 6.8 % — AB (ref 4.0–5.6)

## 2023-08-15 MED ORDER — HYDROCHLOROTHIAZIDE 12.5 MG PO TABS: 12.5000 mg | ORAL_TABLET | Freq: Every day | ORAL | 3 refills | Status: AC

## 2023-08-15 NOTE — Progress Notes (Signed)
 Established patient visit  History, exam, impression, and plan:  1. Primary hypertension (Primary) Jean Hopkins 68 year old female presenting today for follow-up on primary hypertension.  She has a history of atrial fibrillation but notes this is when she was diagnosed with hyperthyroidism and since that has been controlled, she has had no further issues.  She is currently on hydrochlorothiazide 12.5 mg daily, tolerating well without side effects.  Has a blood pressure cuff at home but admits that she does not often check her blood pressure now.  If she does, it is usually in the 130s/80s on average.  She is currently asymptomatic with no concerning cardiovascular symptoms.  Cardiopulmonary exam normal today.  Blood pressure borderline on recheck at 137/78.  Would like her to monitor at home over the next couple of weeks and let me know what her home readings look like.  If they are still elevated or borderline, we will consider increasing her hydrochlorothiazide to 25 mg daily.  2. Postmenopausal She is postmenopausal and now 67 so qualifies for recommended bone density testing.  She is open to this but would like to get it done with her mammogram in December.  Placing order today for future completion. - DG Bone Density; Future  3. Controlled type 2 diabetes mellitus without complication, without long-term current use of insulin (HCC) History of controlled type 2 diabetes.  Has been doing very well and is not currently on any medications for diabetes management.  Her last hemoglobin A1c 6 months ago was 6.2%.  Repeat today at 6.8%. Her previous urine microalbumin was abnormal but on the low side.  Repeat today shows that she is still in the abnormal range.  This could be related to a spike in sugars versus uncontrolled blood pressure.  Would like to get her blood pressure under better control and then plan to repeat this in 3 to 6 months to determine need for further intervention. - POCT HgB  A1C - POCT UA - Microalbumin  4. Preoperative clearance Has an upcoming surgery for a total knee replacement on the right knee with [facility].  This is planned for September 13, 2023.  Reviewed her history, family history, current medications, and treatment plan. Activity is limited by pain in the right knee however without the pain, she would be able to demonstrate at least 4 metabolic equivalents of cardiac capacity.  Diabetes is well-controlled and blood pressure is borderline but fairly well-controlled.  Feel that she would be a low to moderate risk for her upcoming procedure.  With review, she is cleared to proceed with the right total knee replacement as planned.  Review of Systems  Constitutional:  Negative for chills, fever, malaise/fatigue and weight loss.  HENT:  Negative for congestion, ear pain, hearing loss, sinus pain and sore throat.   Eyes:  Negative for blurred vision, photophobia and pain.  Respiratory:  Negative for cough, shortness of breath and wheezing.   Cardiovascular:  Negative for chest pain, palpitations and leg swelling.  Gastrointestinal:  Negative for abdominal pain, constipation, diarrhea, heartburn, nausea and vomiting.  Genitourinary:  Negative for dysuria, frequency and urgency.  Musculoskeletal:  Positive for joint pain (right knee). Negative for falls and neck pain.  Skin:  Negative for itching and rash.  Neurological:  Negative for dizziness, weakness and headaches.  Endo/Heme/Allergies:  Negative for polydipsia. Does not bruise/bleed easily.  Psychiatric/Behavioral:  Negative for depression, substance abuse and suicidal ideas. The patient is not nervous/anxious.  Physical Exam Constitutional:      General: She is not in acute distress.    Appearance: Normal appearance. She is obese. She is not ill-appearing.  HENT:     Head: Normocephalic and atraumatic.     Right Ear: Tympanic membrane, ear canal and external ear normal. There is no impacted cerumen.      Left Ear: Tympanic membrane, ear canal and external ear normal. There is no impacted cerumen.     Nose: Nose normal. No congestion or rhinorrhea.     Mouth/Throat:     Mouth: Mucous membranes are moist.     Pharynx: No oropharyngeal exudate or posterior oropharyngeal erythema.  Eyes:     General: No scleral icterus.       Right eye: No discharge.        Left eye: No discharge.     Extraocular Movements: Extraocular movements intact.     Conjunctiva/sclera: Conjunctivae normal.     Pupils: Pupils are equal, round, and reactive to light.  Neck:     Thyroid: No thyromegaly.     Vascular: No carotid bruit or JVD.     Trachea: Trachea normal.  Cardiovascular:     Rate and Rhythm: Normal rate and regular rhythm.     Pulses: Normal pulses.     Heart sounds: Normal heart sounds. No murmur heard.    No friction rub. No gallop.  Pulmonary:     Effort: Pulmonary effort is normal. No respiratory distress.     Breath sounds: Normal breath sounds. No wheezing.  Abdominal:     General: Bowel sounds are normal. There is no distension.     Palpations: Abdomen is soft.     Tenderness: There is no abdominal tenderness. There is no guarding.  Musculoskeletal:        General: Normal range of motion.     Cervical back: Normal range of motion and neck supple.  Lymphadenopathy:     Cervical: No cervical adenopathy.  Skin:    General: Skin is warm and dry.  Neurological:     Mental Status: She is alert and oriented to person, place, and time.     Cranial Nerves: No cranial nerve deficit.  Psychiatric:        Mood and Affect: Mood normal.        Behavior: Behavior normal.        Thought Content: Thought content normal.        Judgment: Judgment normal.     Procedures performed this visit: None.  Return in about 6 months (around 02/12/2024) for DM/HTN/HLD follow up.  __________________________________ Thayer Ohm, DNP, APRN, FNP-BC Primary Care and Sports Medicine [facility]

## 2023-08-22 DIAGNOSIS — M25561 Pain in right knee: Secondary | ICD-10-CM | POA: Diagnosis not present

## 2023-08-22 DIAGNOSIS — M1711 Unilateral primary osteoarthritis, right knee: Secondary | ICD-10-CM | POA: Diagnosis not present

## 2023-08-29 ENCOUNTER — Ambulatory Visit: Payer: BC Managed Care – PPO | Admitting: Medical-Surgical

## 2023-08-31 ENCOUNTER — Encounter: Payer: Self-pay | Admitting: Medical-Surgical

## 2023-09-04 ENCOUNTER — Encounter (HOSPITAL_COMMUNITY): Payer: Self-pay

## 2023-09-04 NOTE — Patient Instructions (Signed)
 SURGICAL WAITING ROOM VISITATION  Patients having surgery or a procedure may have no more than 2 support people in the waiting area - these visitors may rotate.    Children under the age of 36 must have an adult with them who is not the patient.  Due to an increase in RSV and influenza rates and associated hospitalizations, children ages 78 and under may not visit patients in [facility] hospitals.  Visitors with respiratory illnesses are discouraged from visiting and should remain at home.  If the patient needs to stay at the hospital during part of their recovery, the visitor guidelines for inpatient rooms apply. Pre-op nurse will coordinate an appropriate time for 1 support person to accompany patient in pre-op.  This support person may not rotate.    Please refer to the [facility] website for the visitor guidelines for Inpatients (after your surgery is over and you are in a regular room).       Your procedure is scheduled on: 09-13-23   Report to [facility] Main Entrance    Report to admitting at      0735  AM   Call this number if you have problems the morning of surgery [phone]   Do not eat food :After Midnight.   After Midnight you may have the following liquids until _0655 _____ AM/ DAY OF SURGERY  then nothing by mouth  Water Non-Citrus Juices (without pulp, NO RED-Apple, White grape, White cranberry) Black Coffee (NO MILK/CREAM OR CREAMERS, sugar ok)  Clear Tea (NO MILK/CREAM OR CREAMERS, sugar ok) regular and decaf                             Plain Jell-O (NO RED)                                           Fruit ices (not with fruit pulp, NO RED)                                     Popsicles (NO RED)                                                               Sports drinks like Gatorade (NO RED)                   The day of surgery:  Drink ONE (1) Pre-Surgery  G2  BY 0655   AM the morning of surgery. Drink in one sitting. Do not sip.  This drink  was given to you during your hospital  pre-op appointment visit. Nothing else to drink after completing the  Pre-Surgery  G2.          If you have questions, please contact your surgeon's office.   FOLLOW BOWEL PREP AND ANY ADDITIONAL PRE OP INSTRUCTIONS YOU RECEIVED FROM YOUR SURGEON'S OFFICE!!!     Oral Hygiene is also important to reduce your risk of infection.  Remember - BRUSH YOUR TEETH THE MORNING OF SURGERY WITH YOUR REGULAR TOOTHPASTE  DENTURES WILL BE REMOVED PRIOR TO SURGERY PLEASE DO NOT APPLY "Poly grip" OR ADHESIVES!!!   Do NOT smoke after Midnight   Stop all vitamins and herbal supplements 7 days before surgery.   Take these medicines the morning of surgery with A SIP OF WATER: omeprazole, methimazole  DO NOT TAKE ANY ORAL DIABETIC MEDICATIONS DAY OF YOUR SURGERY                                You may not have any metal on your body including hair pins, jewelry, and body piercing             Do not wear make-up, lotions, powders, perfumes/cologne, or deodorant  Do not wear nail polish including gel and S&S, artificial/acrylic nails, or any other type of covering on natural nails including finger and toenails. If you have artificial nails, gel coating, etc. that needs to be removed by a nail salon please have this removed prior to surgery or surgery may need to be canceled/ delayed if the surgeon/ anesthesia feels like they are unable to be safely monitored.   Do not shave  5 days prior to surgery.             Do not bring valuables to the hospital. [facility] IS NOT             RESPONSIBLE   FOR VALUABLES.   Contacts, glasses, dentures or bridgework may not be worn into surgery.   Bring small overnight bag day of surgery.   DO NOT BRING YOUR HOME MEDICATIONS TO THE HOSPITAL. PHARMACY WILL DISPENSE MEDICATIONS LISTED ON YOUR MEDICATION LIST TO YOU DURING YOUR ADMISSION IN THE HOSPITAL!    Patients discharged on the day of  surgery will not be allowed to drive home.  Someone NEEDS to stay with you for the first 24 hours after anesthesia.   Special Instructions: Bring a copy of your healthcare power of attorney and living will documents the day of surgery if you haven't scanned them before.              Please read over the following fact sheets you were given: IF YOU HAVE QUESTIONS ABOUT YOUR PRE-OP INSTRUCTIONS PLEASE CALL [phone]   If you received a COVID test during your pre-op visit  it is requested that you wear a mask when out in public, stay away from anyone that may not be feeling well and notify your surgeon if you develop symptoms. If you test positive for Covid or have been in contact with anyone that has tested positive in the last 10 days please notify you surgeon.      Pre-operative 5 CHG Bath Instructions   You can play a key role in reducing the risk of infection after surgery. Your skin needs to be as free of germs as possible. You can reduce the number of germs on your skin by washing with CHG (chlorhexidine gluconate) soap before surgery. CHG is an antiseptic soap that kills germs and continues to kill germs even after washing.   DO NOT use if you have an allergy to chlorhexidine/CHG or antibacterial soaps. If your skin becomes reddened or irritated, stop using the CHG and notify one of our RNs at [phone].   Please shower with the CHG soap starting 4 days before surgery using the following schedule:  Please keep in mind the following:  DO NOT shave, including legs and underarms, starting the day of your first shower.   You may shave your face at any point before/day of surgery.  Place clean sheets on your bed the day you start using CHG soap. Use a clean washcloth (not used since being washed) for each shower. DO NOT sleep with pets once you start using the CHG.   CHG Shower Instructions:  If you choose to wash your hair and private area, wash first with your normal  shampoo/soap.  After you use shampoo/soap, rinse your hair and body thoroughly to remove shampoo/soap residue.  Turn the water OFF and apply about 3 tablespoons (45 ml) of CHG soap to a CLEAN washcloth.  Apply CHG soap ONLY FROM YOUR NECK DOWN TO YOUR TOES (washing for 3-5 minutes)  DO NOT use CHG soap on face, private areas, open wounds, or sores.  Pay special attention to the area where your surgery is being performed.  If you are having back surgery, having someone wash your back for you may be helpful. Wait 2 minutes after CHG soap is applied, then you may rinse off the CHG soap.  Pat dry with a clean towel  Put on clean clothes/pajamas   If you choose to wear lotion, please use ONLY the CHG-compatible lotions on the back of this paper.     Additional instructions for the day of surgery: DO NOT APPLY any lotions, deodorants, cologne, or perfumes.   Put on clean/comfortable clothes.  Brush your teeth.  Ask your nurse before applying any prescription medications to the skin.      CHG Compatible Lotions   Aveeno Moisturizing lotion  Cetaphil Moisturizing Cream  Cetaphil Moisturizing Lotion  Clairol Herbal Essence Moisturizing Lotion, Dry Skin  Clairol Herbal Essence Moisturizing Lotion, Extra Dry Skin  Clairol Herbal Essence Moisturizing Lotion, Normal Skin  Curel Age Defying Therapeutic Moisturizing Lotion with Alpha Hydroxy  Curel Extreme Care Body Lotion  Curel Soothing Hands Moisturizing Hand Lotion  Curel Therapeutic Moisturizing Cream, Fragrance-Free  Curel Therapeutic Moisturizing Lotion, Fragrance-Free  Curel Therapeutic Moisturizing Lotion, Original Formula  Eucerin Daily Replenishing Lotion  Eucerin Dry Skin Therapy Plus Alpha Hydroxy Crme  Eucerin Dry Skin Therapy Plus Alpha Hydroxy Lotion  Eucerin Original Crme  Eucerin Original Lotion  Eucerin Plus Crme Eucerin Plus Lotion  Eucerin TriLipid Replenishing Lotion  Keri Anti-Bacterial Hand Lotion  Keri Deep  Conditioning Original Lotion Dry Skin Formula Softly Scented  Keri Deep Conditioning Original Lotion, Fragrance Free Sensitive Skin Formula  Keri Lotion Fast Absorbing Fragrance Free Sensitive Skin Formula  Keri Lotion Fast Absorbing Softly Scented Dry Skin Formula  Keri Original Lotion  Keri Skin Renewal Lotion Keri Silky Smooth Lotion  Keri Silky Smooth Sensitive Skin Lotion  Nivea Body Creamy Conditioning Oil  Nivea Body Extra Enriched Lotion  Nivea Body Original Lotion  Nivea Body Sheer Moisturizing Lotion Nivea Crme  Nivea Skin Firming Lotion  NutraDerm 30 Skin Lotion  NutraDerm Skin Lotion  NutraDerm Therapeutic Skin Cream  NutraDerm Therapeutic Skin Lotion  ProShield Protective Hand Cream   Incentive Spirometer  An incentive spirometer is a tool that can help keep your lungs clear and active. This tool measures how well you are filling your lungs with each breath. Taking long deep breaths may help reverse or decrease the chance of developing breathing (pulmonary) problems (especially infection) following: A long period of time when you are unable to move or be active. BEFORE THE PROCEDURE  If the spirometer includes an indicator to show your best effort, your nurse or respiratory therapist will set it to a desired goal. If possible, sit up straight or lean slightly forward. Try not to slouch. Hold the incentive spirometer in an upright position. INSTRUCTIONS FOR USE  Sit on the edge of your bed if possible, or sit up as far as you can in bed or on a chair. Hold the incentive spirometer in an upright position. Breathe out normally. Place the mouthpiece in your mouth and seal your lips tightly around it. Breathe in slowly and as deeply as possible, raising the piston or the ball toward the top of the column. Hold your breath for 3-5 seconds or for as long as possible. Allow the piston or ball to fall to the bottom of the column. Remove the mouthpiece from your mouth and  breathe out normally. Rest for a few seconds and repeat Steps 1 through 7 at least 10 times every 1-2 hours when you are awake. Take your time and take a few normal breaths between deep breaths. The spirometer may include an indicator to show your best effort. Use the indicator as a goal to work toward during each repetition. After each set of 10 deep breaths, practice coughing to be sure your lungs are clear. If you have an incision (the cut made at the time of surgery), support your incision when coughing by placing a pillow or rolled up towels firmly against it. Once you are able to get out of bed, walk around indoors and cough well. You may stop using the incentive spirometer when instructed by your caregiver.  RISKS AND COMPLICATIONS Take your time so you do not get dizzy or light-headed. If you are in pain, you may need to take or ask for pain medication before doing incentive spirometry. It is harder to take a deep breath if you are having pain. AFTER USE Rest and breathe slowly and easily. It can be helpful to keep track of a log of your progress. Your caregiver can provide you with a simple table to help with this. If you are using the spirometer at home, follow these instructions: SEEK MEDICAL CARE IF:  You are having difficultly using the spirometer. You have trouble using the spirometer as often as instructed. Your pain medication is not giving enough relief while using the spirometer. You develop fever of 100.5 F (38.1 C) or higher. SEEK IMMEDIATE MEDICAL CARE IF:  You cough up bloody sputum that had not been present before. You develop fever of 102 F (38.9 C) or greater. You develop worsening pain at or near the incision site. MAKE SURE YOU:  Understand these instructions. Will watch your condition. Will get help right away if you are not doing well or get worse. Document Released: 10/23/2006 Document Revised: 09/04/2011 Document Reviewed: 12/24/2006 [facility] Patient  Information 2014 [place], [place].   ________________________________________________________________________

## 2023-09-04 NOTE — Progress Notes (Addendum)
 PCP - Christen Butter, DNP  LOV 08-15-23 epic  clearance Cardiologist - Dr. Royann Shivers, LOV 03-21-2018 ENDO LOV 10-09-22 epic Dr. Lonzo Cloud  PPM/ICD -  Device Orders -  Rep Notified -   Chest x-ray -  EKG -  Stress Test -  ECHO - 2019 epic Cardiac Cath -  HgbA1c- 08-15-23 epic 6.8  Sleep Study -  CPAP -   Fasting Blood Sugar -  Checks Blood Sugar _____ times a day  Blood Thinner Instructions: Aspirin Instructions:  ERAS Protcol - PRE-SURGERY Ensure or G2-   COVID TEST-  COVID vaccine -  Activity-- Anesthesia review: HTN, DM no meds, Afib associated with hyperthyroid now under control,  Patient denies shortness of breath, fever, cough and chest pain at PAT appointment   All instructions explained to the patient, with a verbal understanding of the material. Patient agrees to go over the instructions while at home for a better understanding. Patient also instructed to self quarantine after being tested for COVID-19. The opportunity to ask questions was provided.

## 2023-09-05 ENCOUNTER — Encounter (HOSPITAL_COMMUNITY)
Admission: RE | Admit: 2023-09-05 | Discharge: 2023-09-05 | Disposition: A | Discharge status: Home / Self Care | Payer: BC Managed Care – PPO | Source: Ambulatory Visit | Attending: Orthopedic Surgery | Admitting: Orthopedic Surgery

## 2023-09-05 ENCOUNTER — Encounter (HOSPITAL_COMMUNITY): Payer: Self-pay

## 2023-09-05 ENCOUNTER — Other Ambulatory Visit: Payer: Self-pay

## 2023-09-05 VITALS — BP 152/81 | HR 82 | Temp 98.1°F | Resp 16 | Ht 64.0 in | Wt 229.0 lb

## 2023-09-05 DIAGNOSIS — I10 Essential (primary) hypertension: Secondary | ICD-10-CM | POA: Insufficient documentation

## 2023-09-05 DIAGNOSIS — R9431 Abnormal electrocardiogram [ECG] [EKG]: Secondary | ICD-10-CM | POA: Insufficient documentation

## 2023-09-05 DIAGNOSIS — Z01818 Encounter for other preprocedural examination: Secondary | ICD-10-CM | POA: Insufficient documentation

## 2023-09-05 DIAGNOSIS — E119 Type 2 diabetes mellitus without complications: Secondary | ICD-10-CM | POA: Insufficient documentation

## 2023-09-05 HISTORY — DX: Essential (primary) hypertension: I10

## 2023-09-05 HISTORY — DX: Gastro-esophageal reflux disease without esophagitis: K21.9

## 2023-09-05 HISTORY — DX: Myoneural disorder, unspecified: G70.9

## 2023-09-05 HISTORY — DX: Other complications of anesthesia, initial encounter: T88.59XA

## 2023-09-05 HISTORY — DX: Pneumonia, unspecified organism: J18.9

## 2023-09-05 LAB — BASIC METABOLIC PANEL
Anion gap: 7 (ref 5–15)
BUN: 17 mg/dL (ref 8–23)
CO2: 29 mmol/L (ref 22–32)
Calcium: 9.2 mg/dL (ref 8.9–10.3)
Chloride: 101 mmol/L (ref 98–111)
Creatinine, Ser: 0.94 mg/dL (ref 0.44–1.00)
GFR, Estimated: >60 mL/min (ref >60)
Glucose, Bld: 183 mg/dL — ABNORMAL HIGH (ref 70–99)
Potassium: 4.1 mmol/L (ref 3.5–5.1)
Sodium: 137 mmol/L (ref 135–145)

## 2023-09-05 LAB — CBC
HCT: 49 % — ABNORMAL HIGH (ref 36.0–46.0)
Hemoglobin: 15.6 g/dL — ABNORMAL HIGH (ref 12.0–15.0)
MCH: 26.2 pg (ref 26.0–34.0)
MCHC: 31.8 g/dL (ref 30.0–36.0)
MCV: 82.4 fL (ref 80.0–100.0)
Platelets: 301 10*3/uL (ref 150–400)
RBC: 5.95 MIL/uL — ABNORMAL HIGH (ref 3.87–5.11)
RDW: 13.4 % (ref 11.5–15.5)
WBC: 8.5 10*3/uL (ref 4.0–10.5)
nRBC: 0 % (ref 0.0–0.2)

## 2023-09-05 LAB — SURGICAL PCR SCREEN
MRSA, PCR: NEGATIVE
Staphylococcus aureus: NEGATIVE

## 2023-09-05 LAB — GLUCOSE, CAPILLARY: Glucose-Capillary: 225 mg/dL — ABNORMAL HIGH (ref 70–99)

## 2023-09-13 ENCOUNTER — Encounter (HOSPITAL_COMMUNITY)
Admission: RE | Disposition: A | Discharge status: Home / Self Care | Payer: Self-pay | Source: Home / Self Care | Attending: Orthopedic Surgery

## 2023-09-13 ENCOUNTER — Other Ambulatory Visit: Payer: Self-pay

## 2023-09-13 ENCOUNTER — Encounter (HOSPITAL_COMMUNITY): Payer: Self-pay | Admitting: Orthopedic Surgery

## 2023-09-13 ENCOUNTER — Ambulatory Visit (HOSPITAL_COMMUNITY): Payer: Self-pay | Admitting: Certified Registered Nurse Anesthetist

## 2023-09-13 ENCOUNTER — Ambulatory Visit (HOSPITAL_COMMUNITY)
Admission: RE | Admit: 2023-09-13 | Discharge: 2023-09-13 | Disposition: A | Discharge status: Home / Self Care | Payer: BC Managed Care – PPO | Attending: Orthopedic Surgery | Admitting: Orthopedic Surgery

## 2023-09-13 DIAGNOSIS — E119 Type 2 diabetes mellitus without complications: Secondary | ICD-10-CM | POA: Insufficient documentation

## 2023-09-13 DIAGNOSIS — E66813 Obesity, class 3: Secondary | ICD-10-CM | POA: Diagnosis not present

## 2023-09-13 DIAGNOSIS — M1711 Unilateral primary osteoarthritis, right knee: Secondary | ICD-10-CM | POA: Diagnosis not present

## 2023-09-13 DIAGNOSIS — Z87891 Personal history of nicotine dependence: Secondary | ICD-10-CM | POA: Insufficient documentation

## 2023-09-13 DIAGNOSIS — K219 Gastro-esophageal reflux disease without esophagitis: Secondary | ICD-10-CM | POA: Diagnosis not present

## 2023-09-13 DIAGNOSIS — I10 Essential (primary) hypertension: Secondary | ICD-10-CM | POA: Insufficient documentation

## 2023-09-13 DIAGNOSIS — Z96651 Presence of right artificial knee joint: Secondary | ICD-10-CM

## 2023-09-13 DIAGNOSIS — Z6839 Body mass index (BMI) 39.0-39.9, adult: Secondary | ICD-10-CM | POA: Diagnosis not present

## 2023-09-13 DIAGNOSIS — G8918 Other acute postprocedural pain: Secondary | ICD-10-CM | POA: Diagnosis not present

## 2023-09-13 DIAGNOSIS — M25761 Osteophyte, right knee: Secondary | ICD-10-CM | POA: Diagnosis not present

## 2023-09-13 HISTORY — PX: TOTAL KNEE ARTHROPLASTY: SHX125

## 2023-09-13 LAB — GLUCOSE, CAPILLARY
Glucose-Capillary: 124 mg/dL — ABNORMAL HIGH (ref 70–99)
Glucose-Capillary: 96 mg/dL (ref 70–99)

## 2023-09-13 SURGERY — ARTHROPLASTY, KNEE, TOTAL
Anesthesia: Monitor Anesthesia Care | Site: Knee | Laterality: Right

## 2023-09-13 MED ORDER — METHOCARBAMOL 1000 MG/10ML IJ SOLN: 500.0000 mg | Freq: Four times a day (QID) | INTRAMUSCULAR | Status: DC | PRN

## 2023-09-13 MED ORDER — ACETAMINOPHEN 500 MG PO TABS
1000.0000 mg | ORAL_TABLET | Freq: Four times a day (QID) | ORAL | Status: DC
Start: 2023-09-13 — End: 2023-09-13

## 2023-09-13 MED ORDER — CEFAZOLIN SODIUM-DEXTROSE 2-4 GM/100ML-% IV SOLN
2.0000 g | Freq: Four times a day (QID) | INTRAVENOUS | Status: DC
  Administered 2023-09-13: 2 g via INTRAVENOUS

## 2023-09-13 MED ORDER — CELECOXIB 200 MG PO CAPS: 200.0000 mg | ORAL_CAPSULE | Freq: Two times a day (BID) | ORAL | Status: DC

## 2023-09-13 MED ORDER — ONDANSETRON HCL 4 MG/2ML IJ SOLN
4.0000 mg | Freq: Four times a day (QID) | INTRAMUSCULAR | Status: AC | PRN
  Administered 2023-09-13: 4 mg via INTRAVENOUS

## 2023-09-13 MED ORDER — KETOROLAC TROMETHAMINE 30 MG/ML IJ SOLN
INTRAMUSCULAR | Status: DC | PRN
  Administered 2023-09-13: 30 mg

## 2023-09-13 MED ORDER — DEXAMETHASONE SODIUM PHOSPHATE 10 MG/ML IJ SOLN
8.0000 mg | Freq: Once | INTRAMUSCULAR | Status: AC
  Administered 2023-09-13: 8 mg via INTRAVENOUS

## 2023-09-13 MED ORDER — PHENYLEPHRINE HCL-NACL 20-0.9 MG/250ML-% IV SOLN
INTRAVENOUS | Status: DC | PRN
  Administered 2023-09-13: 35 ug/min via INTRAVENOUS

## 2023-09-13 MED ORDER — PROPOFOL 10 MG/ML IV BOLUS
INTRAVENOUS | Status: DC | PRN
  Administered 2023-09-13: 10 mg via INTRAVENOUS
  Administered 2023-09-13: 100 ug/kg/min via INTRAVENOUS
  Administered 2023-09-13: 20 mg via INTRAVENOUS

## 2023-09-13 MED ORDER — 0.9 % SODIUM CHLORIDE (POUR BTL) OPTIME
TOPICAL | Status: DC | PRN
  Administered 2023-09-13: 1000 mL

## 2023-09-13 MED ORDER — OXYCODONE HCL 5 MG PO TABS
10.0000 mg | ORAL_TABLET | ORAL | Status: DC | PRN
  Administered 2023-09-13: 10 mg via ORAL

## 2023-09-13 MED ORDER — ONDANSETRON HCL 4 MG/2ML IJ SOLN
INTRAMUSCULAR | Status: DC
Start: 2023-09-13 — End: 2023-09-13
  Filled 2023-09-13: qty 2

## 2023-09-13 MED ORDER — OXYCODONE HCL 5 MG PO TABS
ORAL_TABLET | ORAL | Status: AC
  Filled 2023-09-13: qty 2

## 2023-09-13 MED ORDER — TRANEXAMIC ACID-NACL 1000-0.7 MG/100ML-% IV SOLN
1000.0000 mg | Freq: Once | INTRAVENOUS | Status: AC
  Administered 2023-09-13: 1000 mg via INTRAVENOUS

## 2023-09-13 MED ORDER — PROPOFOL 1000 MG/100ML IV EMUL
INTRAVENOUS | Status: AC
  Filled 2023-09-13: qty 100

## 2023-09-13 MED ORDER — STERILE WATER FOR IRRIGATION IR SOLN
Status: DC | PRN
  Administered 2023-09-13: 1000 mL

## 2023-09-13 MED ORDER — METHOCARBAMOL 500 MG PO TABS: 500.0000 mg | ORAL_TABLET | Freq: Four times a day (QID) | ORAL | Status: DC | PRN

## 2023-09-13 MED ORDER — BUPIVACAINE-EPINEPHRINE (PF) 0.25% -1:200000 IJ SOLN
INTRAMUSCULAR | Status: DC | PRN
  Administered 2023-09-13: 30 mL

## 2023-09-13 MED ORDER — CHLORHEXIDINE GLUCONATE 0.12 % MT SOLN
15.0000 mL | Freq: Once | OROMUCOSAL | Status: AC
  Administered 2023-09-13: 15 mL via OROMUCOSAL

## 2023-09-13 MED ORDER — ONDANSETRON HCL 4 MG/2ML IJ SOLN
INTRAMUSCULAR | Status: DC | PRN
  Administered 2023-09-13: 4 mg via INTRAVENOUS

## 2023-09-13 MED ORDER — OXYCODONE HCL 5 MG PO TABS
5.0000 mg | ORAL_TABLET | ORAL | Status: DC | PRN
Start: 2023-09-13 — End: 2023-09-13

## 2023-09-13 MED ORDER — TRANEXAMIC ACID-NACL 1000-0.7 MG/100ML-% IV SOLN
1000.0000 mg | INTRAVENOUS | Status: AC
  Administered 2023-09-13: 1000 mg via INTRAVENOUS
  Filled 2023-09-13: qty 100

## 2023-09-13 MED ORDER — HYDROMORPHONE HCL 1 MG/ML IJ SOLN: 0.5000 mg | INTRAMUSCULAR | Status: DC | PRN

## 2023-09-13 MED ORDER — LACTATED RINGERS IV SOLN: INTRAVENOUS | Status: DC

## 2023-09-13 MED ORDER — FENTANYL CITRATE PF 50 MCG/ML IJ SOSY: 25.0000 ug | PREFILLED_SYRINGE | INTRAMUSCULAR | Status: DC | PRN

## 2023-09-13 MED ORDER — OXYCODONE HCL 5 MG PO TABS: 5.0000 mg | ORAL_TABLET | Freq: Once | ORAL | Status: DC | PRN

## 2023-09-13 MED ORDER — ONDANSETRON HCL 4 MG/2ML IJ SOLN
INTRAMUSCULAR | Status: AC
  Filled 2023-09-13: qty 2

## 2023-09-13 MED ORDER — TRANEXAMIC ACID-NACL 1000-0.7 MG/100ML-% IV SOLN
INTRAVENOUS | Status: AC
  Filled 2023-09-13: qty 100

## 2023-09-13 MED ORDER — ROPIVACAINE HCL 5 MG/ML IJ SOLN
INTRAMUSCULAR | Status: DC | PRN
  Administered 2023-09-13: 25 mL via PERINEURAL

## 2023-09-13 MED ORDER — DEXAMETHASONE SODIUM PHOSPHATE 10 MG/ML IJ SOLN
INTRAMUSCULAR | Status: AC
  Filled 2023-09-13: qty 1

## 2023-09-13 MED ORDER — METOCLOPRAMIDE HCL 5 MG PO TABS: 5.0000 mg | ORAL_TABLET | Freq: Three times a day (TID) | ORAL | Status: DC | PRN

## 2023-09-13 MED ORDER — ORAL CARE MOUTH RINSE: 15.0000 mL | Freq: Once | OROMUCOSAL | Status: AC

## 2023-09-13 MED ORDER — ONDANSETRON HCL 4 MG PO TABS: 4.0000 mg | ORAL_TABLET | Freq: Four times a day (QID) | ORAL | Status: DC | PRN

## 2023-09-13 MED ORDER — ONDANSETRON HCL 4 MG/2ML IJ SOLN: 4.0000 mg | Freq: Four times a day (QID) | INTRAMUSCULAR | Status: DC | PRN

## 2023-09-13 MED ORDER — POVIDONE-IODINE 10 % EX SWAB
2.0000 | Freq: Once | CUTANEOUS | Status: AC
  Administered 2023-09-13: 2 via TOPICAL

## 2023-09-13 MED ORDER — BUPIVACAINE IN DEXTROSE 0.75-8.25 % IT SOLN
INTRATHECAL | Status: DC | PRN
  Administered 2023-09-13: 1.8 mL via INTRATHECAL

## 2023-09-13 MED ORDER — SODIUM CHLORIDE 0.9% FLUSH: 3.0000 mL | Freq: Two times a day (BID) | INTRAVENOUS | Status: DC

## 2023-09-13 MED ORDER — METOCLOPRAMIDE HCL 5 MG/ML IJ SOLN: 5.0000 mg | Freq: Three times a day (TID) | INTRAMUSCULAR | Status: DC | PRN

## 2023-09-13 MED ORDER — OXYCODONE HCL 5 MG/5ML PO SOLN: 5.0000 mg | Freq: Once | ORAL | Status: DC | PRN

## 2023-09-13 MED ORDER — FENTANYL CITRATE PF 50 MCG/ML IJ SOSY
50.0000 ug | PREFILLED_SYRINGE | INTRAMUSCULAR | Status: DC
  Administered 2023-09-13: 50 ug via INTRAVENOUS
  Filled 2023-09-13: qty 2

## 2023-09-13 MED ORDER — CEFAZOLIN SODIUM-DEXTROSE 2-4 GM/100ML-% IV SOLN
2.0000 g | INTRAVENOUS | Status: AC
  Administered 2023-09-13: 2 g via INTRAVENOUS
  Filled 2023-09-13: qty 100

## 2023-09-13 MED ORDER — MIDAZOLAM HCL 2 MG/2ML IJ SOLN
1.0000 mg | INTRAMUSCULAR | Status: DC
Start: 2023-09-13 — End: 2023-09-13
  Administered 2023-09-13: 1 mg via INTRAVENOUS
  Filled 2023-09-13: qty 2

## 2023-09-13 MED ORDER — SODIUM CHLORIDE 0.9% FLUSH: 3.0000 mL | INTRAVENOUS | Status: DC | PRN

## 2023-09-13 MED ORDER — SODIUM CHLORIDE 0.9 % IR SOLN
Status: DC | PRN
  Administered 2023-09-13: 1000 mL

## 2023-09-13 SURGICAL SUPPLY — 48 items
ATTUNE MED ANAT PAT 35 KNEE (Knees) IMPLANT
BAG COUNTER SPONGE SURGICOUNT (BAG) IMPLANT
BAG ZIPLOCK 12X15 (MISCELLANEOUS) IMPLANT
BASEPLATE TIB CMT FB PCKT SZ4 (Stem) IMPLANT
BLADE SAW SGTL 13.0X1.19X90.0M (BLADE) ×1 IMPLANT
BNDG ELASTIC 6INX 5YD STR LF (GAUZE/BANDAGES/DRESSINGS) ×1 IMPLANT
BNDG ELASTIC 6X10 VLCR STRL LF (GAUZE/BANDAGES/DRESSINGS) IMPLANT
BOWL SMART MIX CTS (DISPOSABLE) ×1 IMPLANT
CEMENT HV SMART SET (Cement) ×2 IMPLANT
COMP FEM CMT ATTUNE NRW 4 RT (Joint) ×1 IMPLANT
COMPONENT FEM CMT ATTN NRW 4RT (Joint) IMPLANT
COVER SURGICAL LIGHT HANDLE (MISCELLANEOUS) ×1 IMPLANT
CUFF TRNQT CYL 34X4.125X (TOURNIQUET CUFF) ×1 IMPLANT
DERMABOND ADVANCED .7 DNX12 (GAUZE/BANDAGES/DRESSINGS) ×1 IMPLANT
DRAPE U-SHAPE 47X51 STRL (DRAPES) ×1 IMPLANT
DRESSING AQUACEL AG SP 3.5X10 (GAUZE/BANDAGES/DRESSINGS) ×1 IMPLANT
DRSG AQUACEL AG ADV 3.5X10 (GAUZE/BANDAGES/DRESSINGS) IMPLANT
DRSG AQUACEL AG SP 3.5X10 (GAUZE/BANDAGES/DRESSINGS) ×1 IMPLANT
DURAPREP 26ML APPLICATOR (WOUND CARE) ×2 IMPLANT
ELECT REM PT RETURN 15FT ADLT (MISCELLANEOUS) ×1 IMPLANT
GLOVE BIO SURGEON STRL SZ 6 (GLOVE) ×1 IMPLANT
GLOVE BIOGEL PI IND STRL 6.5 (GLOVE) ×1 IMPLANT
GLOVE BIOGEL PI IND STRL 7.5 (GLOVE) ×1 IMPLANT
GLOVE ORTHO TXT STRL SZ7.5 (GLOVE) ×2 IMPLANT
GOWN STRL REUS W/ TWL LRG LVL3 (GOWN DISPOSABLE) ×2 IMPLANT
HOLDER FOLEY CATH W/STRAP (MISCELLANEOUS) IMPLANT
INSERT MED ATTUNE 4X6 RT (Insert) IMPLANT
KIT TURNOVER KIT A (KITS) IMPLANT
MANIFOLD NEPTUNE II (INSTRUMENTS) ×1 IMPLANT
NDL SAFETY ECLIPSE 18X1.5 (NEEDLE) IMPLANT
NS IRRIG 1000ML POUR BTL (IV SOLUTION) ×1 IMPLANT
PACK TOTAL KNEE CUSTOM (KITS) ×1 IMPLANT
PAD COLD SHLDR WRAP-ON (PAD) IMPLANT
PIN FIX SIGMA LCS THRD HI (PIN) IMPLANT
PROTECTOR NERVE ULNAR (MISCELLANEOUS) ×1 IMPLANT
SET HNDPC FAN SPRY TIP SCT (DISPOSABLE) ×1 IMPLANT
SET PAD KNEE POSITIONER (MISCELLANEOUS) ×1 IMPLANT
SPIKE FLUID TRANSFER (MISCELLANEOUS) ×2 IMPLANT
SUT MNCRL AB 4-0 PS2 18 (SUTURE) ×1 IMPLANT
SUT STRATAFIX PDS+ 0 24IN (SUTURE) ×1 IMPLANT
SUT VIC AB 1 CT1 36 (SUTURE) ×1 IMPLANT
SUT VIC AB 2-0 CT1 TAPERPNT 27 (SUTURE) ×2 IMPLANT
SYR 3ML LL SCALE MARK (SYRINGE) ×1 IMPLANT
TOWEL GREEN STERILE FF (TOWEL DISPOSABLE) ×1 IMPLANT
TRAY FOLEY MTR SLVR 16FR STAT (SET/KITS/TRAYS/PACK) ×1 IMPLANT
TUBE SUCTION HIGH CAP CLEAR NV (SUCTIONS) ×1 IMPLANT
WATER STERILE IRR 1000ML POUR (IV SOLUTION) ×2 IMPLANT
WRAP KNEE MAXI GEL POST OP (GAUZE/BANDAGES/DRESSINGS) ×1 IMPLANT

## 2023-09-13 NOTE — Evaluation (Signed)
 Physical Therapy Evaluation Patient Details Name: Jean Hopkins MRN: [id number] DOB: Nov 02, 1955 Today's Date: 09/13/2023  History of Present Illness  Pt s/p R TKR and with hx of a-fib and DM  Clinical Impression  Pt s/p R TKR and presents with decreased R LE strength/ROM and post op pain limiting functional mobility.  Pt performed HEP with written instruction provided and reviewed but OOB deferred with pt report of residual numbness.  Will follow up with gait training as pt condition allows.      If plan is discharge home, recommend the following: A little help with walking and/or transfers;A little help with bathing/dressing/bathroom;Assistance with cooking/housework;Assist for transportation;Help with stairs or ramp for entrance   Can travel by private vehicle        Equipment Recommendations Rolling walker (2 wheels)  Recommendations for Other Services       Functional Status Assessment Patient has had a recent decline in their functional status and/or demonstrates limited ability to make significant improvements in function in a reasonable and predictable amount of time     Precautions / Restrictions Precautions Precautions: Knee Restrictions Weight Bearing Restrictions Per Provider Order: No Other Position/Activity Restrictions: WBAT      Mobility  Bed Mobility               General bed mobility comments: deferred 2* residual numbness feet and buttocks    Transfers                        Ambulation/Gait                  Stairs            Wheelchair Mobility     Tilt Bed    Modified Rankin (Stroke Patients Only)       Balance                                             Pertinent Vitals/Pain Pain Assessment Pain Assessment: 0-10 Pain Score: 5  Pain Location: R knee Pain Descriptors / Indicators: Aching, Sore Pain Intervention(s): Limited activity within patient's tolerance, Monitored during session,  Premedicated before session, Ice applied    Home Living Family/patient expects to be discharged to:: Private residence Living Arrangements: Spouse/significant other Available Help at Discharge: Family;Available 24 hours/day Type of Home: House Home Access: Stairs to enter Entrance Stairs-Rails: None Entrance Stairs-Number of Steps: 1   Home Layout: One level Home Equipment: None      Prior Function Prior Level of Function : Independent/Modified Independent                     Extremity/Trunk Assessment   Upper Extremity Assessment Upper Extremity Assessment: Overall WFL for tasks assessed    Lower Extremity Assessment Lower Extremity Assessment: RLE deficits/detail    Cervical / Trunk Assessment Cervical / Trunk Assessment: Normal  Communication   Communication Communication: No apparent difficulties    Cognition Arousal: Alert Behavior During Therapy: WFL for tasks assessed/performed   PT - Cognitive impairments: No apparent impairments                         Following commands: Intact       Cueing Cueing Techniques: Verbal cues     General Comments  Exercises Total Joint Exercises Ankle Circles/Pumps: AROM, Both, 15 reps, Supine Quad Sets: AROM, Both, 10 reps, Supine Heel Slides: AAROM, Right, 10 reps, Supine Straight Leg Raises: AAROM, Right, 10 reps, Supine Goniometric ROM: R knee AAROM -5 - 40   Assessment/Plan    PT Assessment Patient needs continued PT services  PT Problem List Decreased strength;Decreased range of motion;Decreased activity tolerance;Decreased balance;Decreased mobility;Decreased knowledge of use of DME;Pain       PT Treatment Interventions DME instruction;Gait training;Stair training;Functional mobility training;Therapeutic activities;Therapeutic exercise;Balance training;Patient/family education    PT Goals (Current goals can be found in the Care Plan section)  Acute Rehab PT Goals Patient Stated  Goal: Regain IND PT Goal Formulation: With patient Time For Goal Achievement: 09/20/23 Potential to Achieve Goals: Good    Frequency 7X/week     Co-evaluation               AM-PAC PT "6 Clicks" Mobility  Outcome Measure Help needed turning from your back to your side while in a flat bed without using bedrails?: A Lot Help needed moving from lying on your back to sitting on the side of a flat bed without using bedrails?: A Lot Help needed moving to and from a bed to a chair (including a wheelchair)?: A Lot Help needed standing up from a chair using your arms (e.g., wheelchair or bedside chair)?: Total Help needed to walk in hospital room?: Total Help needed climbing 3-5 steps with a railing? : Total 6 Click Score: 9    End of Session Equipment Utilized During Treatment: Gait belt Activity Tolerance: Patient tolerated treatment well Patient left: in bed;with call bell/phone within reach Nurse Communication: Mobility status PT Visit Diagnosis: Difficulty in walking, not elsewhere classified (R26.2)    Time: 4098-1191 PT Time Calculation (min) (ACUTE ONLY): 22 min   Charges:   PT Evaluation $PT Eval Low Complexity: 1 Low   PT General Charges $$ ACUTE PT VISIT: 1 Visit         Mauro Kaufmann PT Acute Rehabilitation Services Pager [phone] Office [phone]   Dashton Czerwinski 09/13/2023, 5:08 PM

## 2023-09-13 NOTE — Op Note (Signed)
 NAME:  Jean Hopkins                      MEDICAL RECORD NO.:  [id number]                             FACILITY:  [facility]      PHYSICIAN:  Madlyn Frankel. Charlann Boxer, M.D.  DATE OF BIRTH:  1956/01/03      DATE OF PROCEDURE:  09/13/2023                                     OPERATIVE REPORT         PREOPERATIVE DIAGNOSIS:  Right knee osteoarthritis.      POSTOPERATIVE DIAGNOSIS:  Right knee osteoarthritis.      FINDINGS:  The patient was noted to have complete loss of cartilage and   bone-on-bone arthritis with associated osteophytes in the medial and patellofemoral compartments of   the knee with a significant synovitis and associated effusion.  The patient had failed months of conservative treatment including medications, injection therapy, activity modification.     PROCEDURE:  Right total knee replacement.      COMPONENTS USED:  DePuy Attune FB CR MS knee   system, a size 4N femur, 4 tibia, size 6 mm CR MS AOX insert, and 35 anatomic patellar   button.      SURGEON:  Madlyn Frankel. Charlann Boxer, M.D.      ASSISTANT:  Rosalene Billings, PA-C.      ANESTHESIA:  Regional and Spinal.      SPECIMENS:  None.      COMPLICATION:  None.      DRAINS:  None.  EBL: <100 cc      TOURNIQUET TIME:  26 min at 225 mmHg     The patient was stable to the recovery room.      INDICATION FOR PROCEDURE:  Jean Hopkins is a 68 y.o. female patient of   Jean Hopkins.  The patient had been seen, evaluated, and treated for months conservatively in the   office with medication, activity modification, and injections.  The patient had   radiographic changes of bone-on-bone arthritis with endplate sclerosis and osteophytes noted.  Based on the radiographic changes and failed conservative measures, the patient   decided to proceed with definitive treatment, total knee replacement.  Risks of infection, DVT, component failure, need for revision surgery, neurovascular injury were reviewed in the office setting.  The postop course was reviewed  stressing the efforts to maximize post-operative satisfaction and function.  Consent was obtained for benefit of pain   relief.      PROCEDURE IN DETAIL:  The patient was brought to the operative theater.   Once adequate anesthesia, preoperative antibiotics, 2 gm of Ancef,1 gm of Tranexamic Acid, and 10 mg of Decadron administered, the patient was positioned supine with a right thigh tourniquet placed.  The  right lower extremity was prepped and draped in sterile fashion.  A time-   out was performed identifying the patient, planned procedure, and the appropriate extremity.      The right lower extremity was placed in the [facility] leg holder.  The leg was   exsanguinated, tourniquet elevated to 225 mmHg.  A midline incision was   made followed by median parapatellar arthrotomy.  Following initial   exposure, attention was  first directed to the patella.  Precut   measurement was noted to be 23 mm.  I resected down to 14 mm and used a   35 anatomic patellar button to restore patellar height as well as cover the cut surface.      The lug holes were drilled and a metal shim was placed to protect the   patella from retractors and saw blade during the procedure.      At this point, attention was now directed to the femur.  The femoral   canal was opened with a drill, irrigated to try to prevent fat emboli.  An   intramedullary rod was passed at 3 degrees valgus, 9 mm of bone was   resected off the distal femur.  Following this resection, the tibia was   subluxated anteriorly.  Using the extramedullary guide, 2 mm of bone was resected off   the proximal medial tibia.  We confirmed the gap would be   stable medially and laterally with a size 5 spacer block as well as confirmed that the tibial cut was perpendicular in the coronal plane, checking with an alignment rod.      Once this was done, I sized the femur to be a size 4 in the anterior-   posterior dimension, chose a narrow component based on  medial and   lateral dimension.  The size 4 rotation block was then pinned in   position anterior referenced using the C-clamp to set rotation.  The   anterior, posterior, and  chamfer cuts were made without difficulty nor   notching making certain that I was along the anterior cortex to help   with flexion gap stability.      The final box cut was made off the lateral aspect of distal femur.      At this point, the tibia was sized to be a size 4.  The size 4 tray was   then pinned in position through the medial third of the tubercle,   drilled, and keel punched.  Trial reduction was now carried with a 4 femur,  4 tibia, a size 6 mm CR MS insert, and the 35 anatomic patella botton.  The knee was brought to full extension with good flexion stability with the patella   tracking through the trochlea without application of pressure.  Given   all these findings the trial components removed.  Final components were   opened and cement was mixed.  The knee was irrigated with normal saline solution and pulse lavage.  The synovial lining was   then injected with 30 cc of 0.25% Marcaine with epinephrine, 1 cc of Toradol and 30 cc of NS for a total of 61 cc.     Final implants were then cemented onto cleaned and dried cut surfaces of bone with the knee brought to extension with a size 6 mm CR MS trial insert.      Once the cement had fully cured, excess cement was removed   throughout the knee.  I confirmed that I was satisfied with the range of   motion and stability, and the final size 6 mm CR MS AOX insert was chosen.  It was   placed into the knee.      The tourniquet had been let down at 26 minutes.  No significant   hemostasis was required.  The extensor mechanism was then reapproximated using #1 Vicryl and #1 Stratafix sutures with the knee   in flexion.  The   remaining wound was closed with 2-0 Vicryl and running 4-0 Monocryl.   The knee was cleaned, dried, dressed sterilely using Dermabond  and   Aquacel dressing.  The patient was then   brought to recovery room in stable condition, tolerating the procedure   well.   Please note that Physician Assistant, Rosalene Billings, PA-C was present for the entirety of the case, and was utilized for pre-operative positioning, peri-operative retractor management, general facilitation of the procedure and for primary wound closure at the end of the case.              Madlyn Frankel Charlann Boxer, M.D.    09/13/2023 8:49 AM

## 2023-09-13 NOTE — Discharge Instructions (Signed)

## 2023-09-13 NOTE — Anesthesia Procedure Notes (Signed)
 Procedure Name: MAC Date/Time: 09/13/2023 10:20 AM  Performed by: Orest Dikes, CRNAPre-anesthesia Checklist: Patient identified, Emergency Drugs available, Suction available and Patient being monitored Oxygen Delivery Method: Simple face mask

## 2023-09-13 NOTE — Progress Notes (Signed)
 Physical Therapy Treatment Patient Details Name: Jean Hopkins MRN: [id number] DOB: 06-11-1956 Today's Date: 09/13/2023   History of Present Illness Pt s/p R TKR and with hx of a-fib and DM    PT Comments  Pt motivated and progressing well with mobility.  Pt up to ambulate in hall, negotiated stairs, and multiple questions asked and answered.  Spouse and dtr present for session.  Pt eager for dc home this date.   If plan is discharge home, recommend the following: A little help with walking and/or transfers;A little help with bathing/dressing/bathroom;Assistance with cooking/housework;Assist for transportation;Help with stairs or ramp for entrance   Can travel by private vehicle        Equipment Recommendations  Rolling walker (2 wheels)    Recommendations for Other Services       Precautions / Restrictions Precautions Precautions: Knee Restrictions Weight Bearing Restrictions Per Provider Order: No Other Position/Activity Restrictions: WBAT     Mobility  Bed Mobility Overal bed mobility: Needs Assistance Bed Mobility: Supine to Sit     Supine to sit: Contact guard     General bed mobility comments: cues for sequence and use of L LE to self assist    Transfers Overall transfer level: Needs assistance Equipment used: Rolling walker (2 wheels) Transfers: Sit to/from Stand Sit to Stand: Min assist           General transfer comment: Steady assist with cues for LE management and use of UEs to self assist    Ambulation/Gait Ambulation/Gait assistance: Min assist, Contact guard assist Gait Distance (Feet): 100 Feet Assistive device: Rolling walker (2 wheels) Gait Pattern/deviations: Step-to pattern, Decreased step length - right, Decreased step length - left, Shuffle, Trunk flexed Gait velocity: decr     General Gait Details: cues for sequence, posture and position from RW.  Noted improvement in stability with increased distance ambulated   Stairs Stairs:  Yes Stairs assistance: Min assist Stair Management: No rails, Step to pattern, Forwards, With walker, Backwards Number of Stairs: 2 General stair comments: single step twice - once fwd and once bkwd - cues for sequence   Wheelchair Mobility     Tilt Bed    Modified Rankin (Stroke Patients Only)       Balance Overall balance assessment: Needs assistance Sitting-balance support: No upper extremity supported, Feet supported Sitting balance-Leahy Scale: Good     Standing balance support: Bilateral upper extremity supported Standing balance-Leahy Scale: Poor                              Communication Communication Communication: No apparent difficulties  Cognition Arousal: Alert Behavior During Therapy: WFL for tasks assessed/performed   PT - Cognitive impairments: No apparent impairments                         Following commands: Intact      Cueing Cueing Techniques: Verbal cues  Exercises Total Joint Exercises Ankle Circles/Pumps: AROM, Both, 15 reps, Supine Quad Sets: AROM, Both, 10 reps, Supine Heel Slides: AAROM, Right, 10 reps, Supine Straight Leg Raises: AAROM, Right, 10 reps, Supine Goniometric ROM: R knee AAROM -5 - 40    General Comments        Pertinent Vitals/Pain Pain Assessment Pain Assessment: 0-10 Pain Score: 5  Pain Location: R knee Pain Descriptors / Indicators: Aching, Sore Pain Intervention(s): Limited activity within patient's tolerance, Monitored during session, Premedicated before session,  Ice applied    Home Living Family/patient expects to be discharged to:: Private residence Living Arrangements: Spouse/significant other Available Help at Discharge: Family;Available 24 hours/day Type of Home: House Home Access: Stairs to enter Entrance Stairs-Rails: None Entrance Stairs-Number of Steps: 1   Home Layout: One level Home Equipment: None      Prior Function            PT Goals (current goals can now  be found in the care plan section) Acute Rehab PT Goals Patient Stated Goal: Regain IND PT Goal Formulation: With patient Time For Goal Achievement: 09/20/23 Potential to Achieve Goals: Good Progress towards PT goals: Progressing toward goals    Frequency    7X/week      PT Plan      Co-evaluation              AM-PAC PT "6 Clicks" Mobility   Outcome Measure  Help needed turning from your back to your side while in a flat bed without using bedrails?: A Little Help needed moving from lying on your back to sitting on the side of a flat bed without using bedrails?: A Little Help needed moving to and from a bed to a chair (including a wheelchair)?: A Little Help needed standing up from a chair using your arms (e.g., wheelchair or bedside chair)?: A Little Help needed to walk in hospital room?: A Little Help needed climbing 3-5 steps with a railing? : A Little 6 Click Score: 18    End of Session Equipment Utilized During Treatment: Gait belt Activity Tolerance: Patient tolerated treatment well Patient left: in chair;with call bell/phone within reach;with chair alarm set Nurse Communication: Mobility status PT Visit Diagnosis: Difficulty in walking, not elsewhere classified (R26.2)     Time: 6295-2841 PT Time Calculation (min) (ACUTE ONLY): 35 min  Charges:    $Gait Training: 8-22 mins $Therapeutic Activity: 8-22 mins PT General Charges $$ ACUTE PT VISIT: 1 Visit                     Mauro Kaufmann PT Acute Rehabilitation Services Pager [phone] Office [phone]    Draeden Kellman 09/13/2023, 5:15 PM

## 2023-09-13 NOTE — Anesthesia Procedure Notes (Signed)
 Anesthesia Regional Block: Adductor canal block   Pre-Anesthetic Checklist: , timeout performed,  Correct Patient, Correct Site, Correct Laterality,  Correct Procedure, Correct Position, site marked,  Risks and benefits discussed,  Surgical consent,  Pre-op evaluation,  At surgeon's request and post-op pain management  Laterality: Right  Prep: chloraprep       Needles:  Injection technique: Single-shot  Needle Type: Echogenic Needle     Needle Length: 9cm  Needle Gauge: 21     Additional Needles:   Narrative:  Start time: 09/13/2023 10:07 AM End time: 09/13/2023 10:12 AM Injection made incrementally with aspirations every 5 mL.  Performed by: Personally  Anesthesiologist: Achille Rich, MD  Additional Notes: Pt tolerated the procedure well.

## 2023-09-13 NOTE — H&P (Signed)
 TOTAL KNEE ADMISSION H&P  Patient is being admitted for right total knee arthroplasty.  Therapy Plans: outpatient therapy in [place] at [facility] Disposition: Home with husband and family Planned DVT Prophylaxis: aspirin 81mg  BID DME needed: walker & ice machine PCP: Christen Butter - clearance received TXA: IV Allergies: NKDA Anesthesia Concerns: general > difficulty waking up BMI: 39.7 Last HgbA1c: 6.8%   Other: - Planning on SDD - Daughter is Harold Barban Sales promotion account executive at [employer]) - oxycodone, robaxin, tylenol, celebrex - No hx of VTE or cancer - **Ice machine at hospital!! 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  HPI: RAINA Hopkins, 68 y.o. female, has a history of pain and functional disability in the right knee due to arthritis and has failed non-surgical conservative treatments for greater than 12 weeks to includeNSAID's and/or analgesics, corticosteriod injections, and activity modification.  Onset of symptoms was gradual, starting 2 years ago with gradually worsening course since that time. The patient noted no past surgery on the right knee(s).  Patient currently rates pain in the right knee(s) at 8 out of 10 with activity. Patient has worsening of pain with activity and weight bearing and pain that interferes with activities of daily living.  Patient has evidence of joint space narrowing by imaging studies. There is no active infection.  Patient Active Problem List   Diagnosis Date Noted   Postmenopausal 08/15/2023   Hypercholesterolemia 08/25/2022   Personal history of malignant melanoma of skin 08/25/2022   Vitamin D deficiency 08/25/2022   Controlled type 2 diabetes mellitus without complication, without long-term current use of insulin (HCC) 02/23/2022   Hypertension 02/02/2021   Melanoma of skin (HCC) 02/02/2021   Raynaud's phenomenon 02/02/2021   Gastroesophageal reflux disease without esophagitis 01/14/2021   Osteoarthritis  of right knee 01/04/2021   Hyperthyroidism    Atrial fibrillation (HCC) 07/29/2017   Past Medical History:  Diagnosis Date   AF (atrial fibrillation) (HCC)    hyperthyroid induced   Allergy    Anemia    Arthritis 11/2020   Right knee   Blood transfusion without reported diagnosis    Cancer (HCC)    Melanoma x2   Cataract    Complication of anesthesia    Slow to wake up with general anesthesia   Controlled type 2 diabetes mellitus without complication, without long-term current use of insulin (HCC) 02/23/2022   no meds   GERD (gastroesophageal reflux disease)    Hypercholesterolemia 08/25/2022   Hypertension    Hyperthyroidism    Neuromuscular disorder (HCC)    neurpathy feet   Pneumonia     Past Surgical History:  Procedure Laterality Date   ABDOMINAL HYSTERECTOMY  2007   CHOLECYSTECTOMY  1988   TOTAL ABDOMINAL HYSTERECTOMY      No current facility-administered medications for this encounter.   Current Outpatient Medications  Medication Sig Dispense Refill Last Dose/Taking   celecoxib (CELEBREX) 100 MG capsule Take 100 mg by mouth daily.   Taking   Cholecalciferol (VITAMIN D PO) Take 2,000 Units by mouth daily.   Taking   hydrochlorothiazide (HYDRODIURIL) 12.5 MG tablet Take 1 tablet (12.5 mg total) by mouth daily. 90 tablet 3 Taking   methimazole (TAPAZOLE) 5 MG tablet Take 1 tablet (5 mg total) by mouth as directed. Every other day 45 tablet 3 Taking   omeprazole (PRILOSEC) 20 MG capsule Take 20 mg by mouth daily.   Taking   No Known Allergies  Social History  Tobacco Use   Smoking status: Former    Current packs/day: 0.00    Average packs/day: 0.5 packs/day for 1 year (0.5 ttl pk-yrs)    Types: Cigarettes    Start date: 10/25/1979    Quit date: 10/24/1980    Years since quitting: 42.9   Smokeless tobacco: Never  Substance Use Topics   Alcohol use: Yes    Comment: Once or twice a month    Family History  Problem Relation Age of Onset   Hypertension Mother     Hyperlipidemia Mother    Multiple sclerosis Mother    Osteoporosis Mother    Hyperlipidemia Father    Hypertension Father    Heart disease Father    Hypertension Brother    Hypertension Brother    Hypertension Brother    Diverticulitis Brother    Stomach cancer Paternal Grandmother    Breast cancer Paternal Aunt    Polycystic ovary syndrome Child    Heart disease Brother    Hypertension Brother    Hypertension Brother    Thyroid disease Neg Hx    Colon cancer Neg Hx    Esophageal cancer Neg Hx    Rectal cancer Neg Hx      Review of Systems  Constitutional:  Negative for chills and fever.  Respiratory:  Negative for cough and shortness of breath.   Cardiovascular:  Negative for chest pain.  Gastrointestinal:  Negative for nausea and vomiting.  Musculoskeletal:  Positive for arthralgias.     Objective:  Physical Exam Well nourished and well developed. General: Alert and oriented x3, cooperative and pleasant, no acute distress.  Musculoskeletal: Right knee exam: Noted genu varum with associated slight flexion contracture with flexion over 110 degrees with tightness over the anterior medial aspect the knee Stable medial and lateral collateral ligaments Normal right hip range of motion without groin pain or referred pain No lower extremity edema, erythema or calf tenderness   Calves soft and nontender. Motor function intact in LE. Strength 5/5 LE bilaterally. Neuro: Distal pulses 2+. Sensation to light touch intact in LE.  Vital signs in last 24 hours:    Labs:   Estimated body mass index is 39.31 kg/m as calculated from the following:   Height as of 09/05/23: 5\' 4"  (1.626 m).   Weight as of 09/05/23: 103.9 kg.   Imaging Review Plain radiographs demonstrate severe degenerative joint disease of the right knee(s). The overall alignment isneutral. The bone quality appears to be adequate for age and reported activity level.      Assessment/Plan:  End stage  arthritis, right knee   The patient history, physical examination, clinical judgment of the provider and imaging studies are consistent with end stage degenerative joint disease of the right knee(s) and total knee arthroplasty is deemed medically necessary. The treatment options including medical management, injection therapy arthroscopy and arthroplasty were discussed at length. The risks and benefits of total knee arthroplasty were presented and reviewed. The risks due to aseptic loosening, infection, stiffness, patella tracking problems, thromboembolic complications and other imponderables were discussed. The patient acknowledged the explanation, agreed to proceed with the plan and consent was signed. Patient is being admitted for inpatient treatment for surgery, pain control, PT, OT, prophylactic antibiotics, VTE prophylaxis, progressive ambulation and ADL's and discharge planning. The patient is planning to be discharged  home.     Patient's anticipated LOS is less than 2 midnights, meeting these requirements: - Younger than 61 - Lives within 1 hour of care -  Has a competent adult at home to recover with post-op recover - NO history of  - Chronic pain requiring opiods  - Diabetes  - Coronary Artery Disease  - Heart failure  - Heart attack  - Stroke  - DVT/VTE  - Cardiac arrhythmia  - Respiratory Failure/COPD  - Renal failure  - Anemia  - Advanced Liver disease  Rosalene Billings, PA-C Orthopedic Surgery [facility] [place] [phone]

## 2023-09-13 NOTE — Anesthesia Preprocedure Evaluation (Signed)
 Anesthesia Evaluation  Patient identified by MRN, date of birth, ID band Patient awake    Reviewed: Allergy & Precautions, H&P , NPO status , Patient's Chart, lab work & pertinent test results  Airway Mallampati: II   Neck ROM: full    Dental   Pulmonary former smoker   breath sounds clear to auscultation       Cardiovascular hypertension,  Rhythm:regular Rate:Normal     Neuro/Psych  Neuromuscular disease    GI/Hepatic ,GERD  ,,  Endo/Other  diabetes, Type 2 Hyperthyroidism Class 3 obesity  Renal/GU      Musculoskeletal  (+) Arthritis ,    Abdominal   Peds  Hematology   Anesthesia Other Findings   Reproductive/Obstetrics                             Anesthesia Physical Anesthesia Plan  ASA: 3  Anesthesia Plan: MAC and Spinal   Post-op Pain Management: Regional block*   Induction: Intravenous  PONV Risk Score and Plan: 2 and Propofol infusion and Treatment may vary due to age or medical condition  Airway Management Planned: Simple Face Mask  Additional Equipment:   Intra-op Plan:   Post-operative Plan: Extubation in OR  Informed Consent: I have reviewed the patients History and Physical, chart, labs and discussed the procedure including the risks, benefits and alternatives for the proposed anesthesia with the patient or authorized representative who has indicated his/her understanding and acceptance.     Dental advisory given  Plan Discussed with: CRNA, Anesthesiologist and Surgeon  Anesthesia Plan Comments:        Anesthesia Quick Evaluation

## 2023-09-13 NOTE — Interval H&P Note (Signed)
 History and Physical Interval Note:  09/13/2023 8:48 AM  Jean Hopkins  has presented today for surgery, with the diagnosis of Right Knee Osteoarthritis.  The various methods of treatment have been discussed with the patient and family. After consideration of risks, benefits and other options for treatment, the patient has consented to  Procedure(s): ARTHROPLASTY, KNEE, TOTAL (Right) as a surgical intervention.  The patient's history has been reviewed, patient examined, no change in status, stable for surgery.  I have reviewed the patient's chart and labs.  Questions were answered to the patient's satisfaction.     [name]

## 2023-09-13 NOTE — Transfer of Care (Signed)
 Immediate Anesthesia Transfer of Care Note  Patient: Jean Hopkins  Procedure(s) Performed: ARTHROPLASTY, KNEE, TOTAL (Right: Knee)  Patient Location: PACU  Anesthesia Type:MAC and Spinal  Level of Consciousness: awake, alert , and oriented  Airway & Oxygen Therapy: Patient Spontanous Breathing and Patient connected to face mask oxygen  Post-op Assessment: Report given to RN and Post -op Vital signs reviewed and stable  Post vital signs: Reviewed and stable  Last Vitals:  Vitals Value Taken Time  BP    Temp    Pulse 87 09/13/23 1204  Resp 15 09/13/23 1204  SpO2 100 % 09/13/23 1204  Vitals shown include unfiled device data.  Last Pain:  Vitals:   09/13/23 0804  TempSrc:   PainSc: 0-No pain         Complications: No notable events documented.

## 2023-09-13 NOTE — Therapy (Signed)
 OUTPATIENT PHYSICAL THERAPY LOWER EXTREMITY EVALUATION   Patient Name: Jean Hopkins MRN: [id number] DOB:February 25, 1956, 68 y.o., female Today's Date: 09/17/2023  END OF SESSION:  PT End of Session - 09/17/23 1019     Visit Number 1    Date for PT Re-Evaluation 11/12/23    Authorization Type BCBS    PT Start Time 1019    PT Stop Time 1108    PT Time Calculation (min) 49 min    Activity Tolerance Patient tolerated treatment well    Behavior During Therapy WFL for tasks assessed/performed             Past Medical History:  Diagnosis Date   AF (atrial fibrillation) (HCC)    hyperthyroid induced   Allergy    Anemia    Arthritis 11/2020   Right knee   Blood transfusion without reported diagnosis    Cancer (HCC)    Melanoma x2   Cataract    Complication of anesthesia    Slow to wake up with general anesthesia   Controlled type 2 diabetes mellitus without complication, without long-term current use of insulin (HCC) 02/23/2022   no meds   GERD (gastroesophageal reflux disease)    Hypercholesterolemia 08/25/2022   Hypertension    Hyperthyroidism    Neuromuscular disorder (HCC)    neurpathy feet   Pneumonia    Past Surgical History:  Procedure Laterality Date   ABDOMINAL HYSTERECTOMY  2007   CHOLECYSTECTOMY  1988   TOTAL ABDOMINAL HYSTERECTOMY     TOTAL KNEE ARTHROPLASTY Right 09/13/2023   Procedure: ARTHROPLASTY, KNEE, TOTAL;  Surgeon: Durene Romans, MD;  Location: [facility];  Service: Orthopedics;  Laterality: Right;   Patient Active Problem List   Diagnosis Date Noted   S/P total knee arthroplasty, right 09/13/2023   Postmenopausal 08/15/2023   Hypercholesterolemia 08/25/2022   Personal history of malignant melanoma of skin 08/25/2022   Vitamin D deficiency 08/25/2022   Controlled type 2 diabetes mellitus without complication, without long-term current use of insulin (HCC) 02/23/2022   Hypertension 02/02/2021   Melanoma of skin (HCC) 02/02/2021   Raynaud's phenomenon  02/02/2021   Gastroesophageal reflux disease without esophagitis 01/14/2021   Osteoarthritis of right knee 01/04/2021   Hyperthyroidism    Atrial fibrillation (HCC) 07/29/2017    PCP: Christen Butter, NP   REFERRING PROVIDER: Cassandria Anger, PA-C     REFERRING DIAG: ICD-10: M25.561: Pain in right knee  THERAPY DIAG:  Stiffness of right knee, not elsewhere classified  Other abnormalities of gait and mobility  Muscle weakness (generalized)  Acute pain of right knee  Localized edema  Rationale for Evaluation and Treatment: Rehabilitation  ONSET DATE: 09/13/2023  NEXT MD VISIT: 10/04/23   SUBJECTIVE:   SUBJECTIVE STATEMENT: Pt s/p R TKA on 09/13/23.  Pain was up 8-9/10 initially after the block wore off.  Ace wrap until yesterday.  Walking as much as she can in the house with the RW - at least 5-6x/day.  Has been sleeping in the recliner.  Unable to sleep flat in the bed but has ordered a wedge.   PERTINENT HISTORY: HTN, DM, A-Fib, Melanoma, Neuromuscular disorder, GERD  PAIN:  Are you having pain? Yes: NPRS scale: 0/10 currently, on average 5/10  Pain location: R anterior knee Pain description: more sharp than dull, throbbing  Aggravating factors: movement - trying to bend knee  Relieving factors: ice machine, meds  PRECAUTIONS: None  RED FLAGS: None   WEIGHT BEARING RESTRICTIONS: No  FALLS:  Has  patient fallen in last 6 months? 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  DIAGNOSTIC FINDINGS: N/A  PATIENT SURVEYS:  Lower Extremity Functional Score: 12 / 80 = 15.0 %  COGNITION: Overall cognitive status: Within functional limits for tasks assessed     SENSATION: WFL Mild neuropathy in B feet  EDEMA:  Circumferential:     R = 49.5 cm (6 cm above patella), 45.5 cm (at patella) 42.0 cm (6 cm below patella) L = R = 45.0 cm (6 cm above patella), 41.5 cm (at patella) 34.0 cm (6 cm below patella)  PALPATION: TTP  LOWER EXTREMITY ROM:  A/PROM Right eval Left eval  Hip flexion    Hip extension    Hip abduction    Hip adduction    Hip internal rotation    Hip external rotation    Knee flexion 60 / 62 109  Knee extension -15 / -7 0  Ankle dorsiflexion    Ankle plantarflexion    Ankle inversion    Ankle eversion     (Blank rows = not tested)  LOWER EXTREMITY MMT:  MMT Right eval Left eval  Hip flexion 3+ 4  Hip extension 4- 4  Hip abduction 4- 4  Hip adduction 3+ p! 4  Hip internal rotation    Hip external rotation    Knee flexion 4- 4  Knee extension 3+ p! 4  Ankle dorsiflexion    Ankle plantarflexion    Ankle inversion    Ankle eversion     (Blank rows = not tested)  LOWER EXTREMITY SPECIAL TESTS:  N/A  FUNCTIONAL TESTS:  Timed up and go (TUG): 29.56 sec with RW   GAIT: Distance walked: clinic distances Assistive device utilized: Environmental consultant - 2 wheeled Level of assistance: Modified independence Gait pattern: step to pattern, decreased step length- Right, decreased stance time- Right, and decreased hip/knee flexion- Right Comments: Performed TUG w/ pt: 29.56s                                                                                                                               TREATMENT DATE:   09/17/2023  SELF CARE: Provided education to improve safety with use of RW for assistive device, to reduce fall risk, to promote safe home environment, and to increase  independence with ADLs. Adjust RW height to improve posture, cueing on performing step through gait pattern, educate pt on importance  of heel strike in gait pattern to activate quads, continue with hospital HEP to improve ROM and flexibility, educate pt on knee positioning w/ pillow or wedge to prevent flexion contracture when sitting  PATIENT EDUCATION:  Education details: PT eval findings, anticipated POC, HEP review, and continue with current HEP Person educated: Patient Education method: Explanation, Demonstration, Tactile cues, and Verbal cues Education comprehension: verbalized understanding, returned demonstration, verbal cues required, tactile cues required, and needs further education  HOME EXERCISE PROGRAM: Currently performing hospital HEP  ASSESSMENT:  CLINICAL IMPRESSION: Tongela is a 68 y.o. female who was seen today for physical therapy evaluation and treatment for s/p R TKR on 09/13/2023. She is showing limited ROM, decreased strength, impaired mobility and pain in R LE. She is experiencing more swelling in R compared to L knee and showing abnormal gait pattern w/ RW. Acacia will benefit from skilled PT to address above deficits to improve mobility and activity tolerance with decreased pain interference.   OBJECTIVE IMPAIRMENTS: Abnormal gait, decreased activity tolerance, decreased endurance, decreased knowledge of use of DME, decreased mobility, difficulty walking, decreased ROM, decreased strength, increased edema, impaired perceived functional ability, impaired flexibility, improper body mechanics, postural dysfunction, and pain.   ACTIVITY LIMITATIONS: carrying, lifting, bending, standing, squatting, stairs, transfers, bed mobility, bathing, toileting, dressing, and locomotion level  PARTICIPATION LIMITATIONS: cleaning, laundry, driving, community activity, and occupation  PERSONAL FACTORS: Age, Fitness, and 3+ comorbidities: HTN, DM, A-Fib, Melanoma, Neuromuscular disorder,  GERD  are also affecting patient's functional outcome.   REHAB POTENTIAL: Excellent  CLINICAL DECISION MAKING: Evolving/moderate complexity  EVALUATION COMPLEXITY: Moderate   GOALS: Goals reviewed with patient? Yes  SHORT TERM GOALS: Target date: 10/15/2023  Pt will be independent w/ HEP Baseline: Goal status: INITIAL  2.  Pt will have improve R knee ROM to -7 to 90 to normalize gait pattern Baseline: R knee AROM -15 to 60 & PROM -7 to 62 Goal status: INITIAL  3.  Pt will be able to perform TUG <20 sec w/ AD Baseline: 29.56 sec with RW  Goal status: INITIAL   LONG TERM GOALS: Target date: 11/12/2023  Pt will be able to improve LEFS score to 40/80 to demonstrate functional ability Baseline:  LEFS 12 / 80 = 15.0 % Goal status: INITIAL  2.  Pt will report at least 60% improvement in R knee pain to improve QoL Baseline: 0/10 on eval, on average 5/10 Goal status: INITIAL  3.  Pt will be able to perform decreased TUG time in <=13.5 sec to decrease fall risk Baseline: 29.56 sec with RW  Goal status: INITIAL  4.  Pt will improve to improve overall R LE strength 4+/5 to improve stability and mobility Baseline: refer above to MMT table Goal status: INITIAL  5.  Pt will improve R knee AROM 0 to 105 to normalize gait pattern Baseline: R knee AROM -15 to 60 & PROM -7 to 62 Goal status: INITIAL  6.  Pt will be able go up a flight stairs with alternating gait pattern w/ UE support w/ SPC or single hand rail to increase independence in community access Baseline: Not tested Goal status: INITIAL   7.  Pt will be able to implement step through gait pattern to safely ambulate in community w/ LRAD Baseline: Step to gait pattern w/ flexed posture using RW Goal status: INITIAL    PLAN:  PT FREQUENCY: 3x/week - 3x/wk x 2 weeks, tapering to 2x a week for duration of POC  PT DURATION: 8  weeks  PLANNED INTERVENTIONS: 97164- PT Re-evaluation, 97110-Therapeutic exercises, 97530-  Therapeutic activity, 97112- Neuromuscular re-education, 97535- Self Care, 69629- Manual therapy, [phone]- Gait training, [phone]- Electrical stimulation (manual), 97016- Vasopneumatic device, [phone]- Ionotophoresis 4mg /ml Dexamethasone, Patient/Family education, Balance training, Stair training, Taping, Dry Needling, Joint mobilization, Scar mobilization, DME instructions, Cryotherapy, Moist heat, and 97750 - Physical Performance Test or Measurement  PLAN FOR NEXT SESSION: Progress HEP, improve flexibility & ROM, joint mobs   Marry Guan, PT 09/17/2023, 5:54 PM

## 2023-09-13 NOTE — Anesthesia Procedure Notes (Signed)
 Spinal  Patient location during procedure: OR Start time: 09/13/2023 10:28 AM End time: 09/13/2023 10:31 AM Reason for block: surgical anesthesia Staffing Performed: anesthesiologist  Anesthesiologist: Achille Rich, MD Performed by: Achille Rich, MD Authorized by: Achille Rich, MD   Preanesthetic Checklist Completed: patient identified, IV checked, risks and benefits discussed, surgical consent, monitors and equipment checked, pre-op evaluation and timeout performed Spinal Block Patient position: sitting Prep: DuraPrep Patient monitoring: cardiac monitor, continuous pulse ox and blood pressure Approach: midline Location: L3-4 Injection technique: single-shot Needle Needle type: Pencan  Needle gauge: 24 G Needle length: 9 cm Assessment Sensory level: T10 Events: CSF return Additional Notes Functioning IV was confirmed and monitors were applied. Sterile prep and drape, including hand hygiene and sterile gloves were used. The patient was positioned and the spine was prepped. The skin was anesthetized with lidocaine.  Free flow of clear CSF was obtained prior to injecting local anesthetic into the CSF.  The spinal needle aspirated freely following injection.  The needle was carefully withdrawn.  The patient tolerated the procedure well.

## 2023-09-14 ENCOUNTER — Encounter (HOSPITAL_COMMUNITY): Payer: Self-pay | Admitting: Orthopedic Surgery

## 2023-09-14 NOTE — Anesthesia Postprocedure Evaluation (Signed)
 Anesthesia Post Note  Patient: Phyllis Abelson Okane  Procedure(s) Performed: ARTHROPLASTY, KNEE, TOTAL (Right: Knee)     Patient location during evaluation: PACU Anesthesia Type: MAC, Spinal and Regional Level of consciousness: oriented and awake and alert Pain management: pain level controlled Vital Signs Assessment: post-procedure vital signs reviewed and stable Respiratory status: spontaneous breathing, respiratory function stable and patient connected to nasal cannula oxygen Cardiovascular status: blood pressure returned to baseline and stable Postop Assessment: no headache, no backache and no apparent nausea or vomiting Anesthetic complications: no   No notable events documented.  Last Vitals:  Vitals:   09/13/23 1435 09/13/23 1500  BP: (!) 145/89 (!) 158/84  Pulse: 81 83  Resp: 16   Temp:    SpO2: 93% 94%    Last Pain:  Vitals:   09/13/23 1700  TempSrc:   PainSc: Asleep                 Slyvia Lartigue S

## 2023-09-17 ENCOUNTER — Ambulatory Visit: Payer: BC Managed Care – PPO | Attending: Student | Admitting: Physical Therapy

## 2023-09-17 ENCOUNTER — Encounter: Payer: Self-pay | Admitting: Physical Therapy

## 2023-09-17 ENCOUNTER — Other Ambulatory Visit: Payer: Self-pay

## 2023-09-17 DIAGNOSIS — M6281 Muscle weakness (generalized): Secondary | ICD-10-CM | POA: Diagnosis not present

## 2023-09-17 DIAGNOSIS — M25661 Stiffness of right knee, not elsewhere classified: Secondary | ICD-10-CM | POA: Insufficient documentation

## 2023-09-17 DIAGNOSIS — R600 Localized edema: Secondary | ICD-10-CM | POA: Diagnosis not present

## 2023-09-17 DIAGNOSIS — M25561 Pain in right knee: Secondary | ICD-10-CM | POA: Insufficient documentation

## 2023-09-17 DIAGNOSIS — R2689 Other abnormalities of gait and mobility: Secondary | ICD-10-CM | POA: Diagnosis not present

## 2023-09-19 ENCOUNTER — Ambulatory Visit: Payer: BC Managed Care – PPO

## 2023-09-19 DIAGNOSIS — M25561 Pain in right knee: Secondary | ICD-10-CM | POA: Diagnosis not present

## 2023-09-19 DIAGNOSIS — R2689 Other abnormalities of gait and mobility: Secondary | ICD-10-CM

## 2023-09-19 DIAGNOSIS — M6281 Muscle weakness (generalized): Secondary | ICD-10-CM | POA: Diagnosis not present

## 2023-09-19 DIAGNOSIS — R600 Localized edema: Secondary | ICD-10-CM

## 2023-09-19 DIAGNOSIS — M25661 Stiffness of right knee, not elsewhere classified: Secondary | ICD-10-CM

## 2023-09-19 NOTE — Therapy (Signed)
 OUTPATIENT PHYSICAL THERAPY LOWER EXTREMITY TREATMENT  Patient Name: Jean Hopkins MRN: [id number] DOB:16-Feb-1956, 69 y.o., female Today's Date: 09/19/2023  END OF SESSION:  PT End of Session - 09/19/23 1051     Visit Number 2    Date for PT Re-Evaluation 11/12/23    Authorization Type BCBS    PT Start Time 1018    PT Stop Time 1102    PT Time Calculation (min) 44 min    Activity Tolerance Patient tolerated treatment well    Behavior During Therapy WFL for tasks assessed/performed              Past Medical History:  Diagnosis Date   AF (atrial fibrillation) (HCC)    hyperthyroid induced   Allergy    Anemia    Arthritis 11/2020   Right knee   Blood transfusion without reported diagnosis    Cancer (HCC)    Melanoma x2   Cataract    Complication of anesthesia    Slow to wake up with general anesthesia   Controlled type 2 diabetes mellitus without complication, without long-term current use of insulin (HCC) 02/23/2022   no meds   GERD (gastroesophageal reflux disease)    Hypercholesterolemia 08/25/2022   Hypertension    Hyperthyroidism    Neuromuscular disorder (HCC)    neurpathy feet   Pneumonia    Past Surgical History:  Procedure Laterality Date   ABDOMINAL HYSTERECTOMY  2007   CHOLECYSTECTOMY  1988   TOTAL ABDOMINAL HYSTERECTOMY     TOTAL KNEE ARTHROPLASTY Right 09/13/2023   Procedure: ARTHROPLASTY, KNEE, TOTAL;  Surgeon: Durene Romans, MD;  Location: [facility];  Service: Orthopedics;  Laterality: Right;   Patient Active Problem List   Diagnosis Date Noted   S/P total knee arthroplasty, right 09/13/2023   Postmenopausal 08/15/2023   Hypercholesterolemia 08/25/2022   Personal history of malignant melanoma of skin 08/25/2022   Vitamin D deficiency 08/25/2022   Controlled type 2 diabetes mellitus without complication, without long-term current use of insulin (HCC) 02/23/2022   Hypertension 02/02/2021   Melanoma of skin (HCC) 02/02/2021   Raynaud's phenomenon  02/02/2021   Gastroesophageal reflux disease without esophagitis 01/14/2021   Osteoarthritis of right knee 01/04/2021   Hyperthyroidism    Atrial fibrillation (HCC) 07/29/2017    PCP: Christen Butter, NP   REFERRING PROVIDER: Cassandria Anger, PA-C     REFERRING DIAG: ICD-10: M25.561: Pain in right knee  THERAPY DIAG:  Stiffness of right knee, not elsewhere classified  Other abnormalities of gait and mobility  Muscle weakness (generalized)  Acute pain of right knee  Localized edema  Rationale for Evaluation and Treatment: Rehabilitation  ONSET DATE: 09/13/2023  NEXT MD VISIT: 10/04/23   SUBJECTIVE:   SUBJECTIVE STATEMENT: Pt s/p R TKA on 09/13/23. Doing exercises multiple times per day. She took her medication before coming in today.  PERTINENT HISTORY: HTN, DM, A-Fib, Melanoma, Neuromuscular disorder, GERD  PAIN:  Are you having pain? Yes: NPRS scale: 2/10  Pain location: R anterior knee Pain description: more sharp than dull, throbbing  Aggravating factors: movement - trying to bend knee  Relieving factors: ice machine, meds  PRECAUTIONS: None  RED FLAGS: None   WEIGHT BEARING RESTRICTIONS: No  FALLS:  Has patient fallen in last 6 months? 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  DIAGNOSTIC FINDINGS: N/A  PATIENT SURVEYS:  Lower Extremity Functional Score: 12 / 80 = 15.0 %  COGNITION: Overall  cognitive status: Within functional limits for tasks assessed     SENSATION: WFL Mild neuropathy in B feet  EDEMA:  Circumferential:     R = 49.5 cm (6 cm above patella), 45.5 cm (at patella) 42.0 cm (6 cm below patella) L = R = 45.0 cm (6 cm above patella), 41.5 cm (at patella) 34.0 cm (6 cm below patella)  PALPATION: TTP  LOWER EXTREMITY ROM:  A/PROM Right eval Left eval  Hip flexion    Hip extension    Hip abduction    Hip adduction    Hip internal rotation    Hip external rotation    Knee flexion 60 / 62 109  Knee extension -15 / -7 0  Ankle dorsiflexion    Ankle plantarflexion    Ankle inversion    Ankle eversion     (Blank rows = not tested)  LOWER EXTREMITY MMT:  MMT Right eval Left eval  Hip flexion 3+ 4  Hip extension 4- 4  Hip abduction 4- 4  Hip adduction 3+ p! 4  Hip internal rotation    Hip external rotation    Knee flexion 4- 4  Knee extension 3+ p! 4  Ankle dorsiflexion    Ankle plantarflexion    Ankle inversion    Ankle eversion     (Blank rows = not tested)  LOWER EXTREMITY SPECIAL TESTS:  N/A  FUNCTIONAL TESTS:  Timed up and go (TUG): 29.56 sec with RW   GAIT: Distance walked: clinic distances Assistive device utilized: Environmental consultant - 2 wheeled Level of assistance: Modified independence Gait pattern: step to pattern, decreased step length- Right, decreased stance time- Right, and decreased hip/knee flexion- Right Comments: Performed TUG w/ pt: 29.56s                                                                                                                               TREATMENT DATE:  09/19/23 Therapeutic Exercise: to improve strength, ROM, flexibility, and endurance  Nustep L3x69min Quad sets towel under knee 10x5"- cues to avoid glute activation SLR R x 10 Supine heel slides AROM x 10; AAROM with strap x 10 Supine L SKTC for R hip flexor elongation 2x30" Standing R hip flexor stretch x 30" R LAQ x 10  NEUROMUSCULAR  RE-EDUCATION: To improve proprioception, balance, and kinesthesia. Church pew 2x10 Retro step: RLE behind/L in front 2x10  GAIT TRAINING: To normalize gait pattern and improve safety. 360' with RW- decreased heel  strike, decreased hip-knee flexion, excessive R hip rotation on weight acceptance  09/17/2023  SELF CARE: Provided education to improve safety with use of RW for assistive device, to reduce fall risk, to promote safe home environment, and to increase independence with ADLs. Adjust RW height to improve posture, cueing on performing step through gait pattern, educate pt on importance of heel strike in gait pattern to activate quads, continue with hospital HEP to improve ROM and flexibility, educate pt on knee positioning w/ pillow or wedge to prevent flexion contracture when sitting  PATIENT EDUCATION:  Education details: PT eval findings, anticipated POC, HEP review, and continue with current HEP Person educated: Patient Education method: Explanation, Demonstration, Tactile cues, and Verbal cues Education comprehension: verbalized understanding, returned demonstration, verbal cues required, tactile cues required, and needs further education  HOME EXERCISE PROGRAM: Currently performing hospital HEP Access Code: [email or web address] URL: [email or web address] Date: 09/19/2023 Prepared by: Verta Ellen  Exercises - Church Pew  - 1 x daily - 7 x weekly - 2-3 sets - 10 reps - Stride Stance Weight Shift  - 1 x daily - 7 x weekly - 2-3 sets - 10 reps - Gastroc Stretch on Wall  - 1 x daily - 7 x weekly - 3 sets - 3 reps - 30 seconds hold  ASSESSMENT:  CLINICAL IMPRESSION: Haille is a 68 y.o. female who was seen today for physical therapy treatment s/p R TKR on 09/13/2023. She shows moderate swelling with minimal pain today. Made modifications to her hospital HEP for quad sets to put towel under knee to increase quad contraction. She needs cues to increase knee flexion with gait along  with heel strike. Overall she shows a good tolerance for interventions, will continue to progress. Aryiana will benefit from skilled PT to address above deficits to improve mobility and activity tolerance with decreased pain interference.   OBJECTIVE IMPAIRMENTS: Abnormal gait, decreased activity tolerance, decreased endurance, decreased knowledge of use of DME, decreased mobility, difficulty walking, decreased ROM, decreased strength, increased edema, impaired perceived functional ability, impaired flexibility, improper body mechanics, postural dysfunction, and pain.   ACTIVITY LIMITATIONS: carrying, lifting, bending, standing, squatting, stairs, transfers, bed mobility, bathing, toileting, dressing, and locomotion level  PARTICIPATION LIMITATIONS: cleaning, laundry, driving, community activity, and occupation  PERSONAL FACTORS: Age, Fitness, and 3+ comorbidities: HTN, DM, A-Fib, Melanoma, Neuromuscular disorder, GERD  are also affecting patient's functional outcome.   REHAB POTENTIAL: Excellent  CLINICAL DECISION MAKING: Evolving/moderate complexity  EVALUATION COMPLEXITY: Moderate   GOALS: Goals reviewed with patient? Yes  SHORT TERM GOALS: Target date: 10/15/2023  Pt will be independent w/ HEP Baseline: Goal status: INITIAL  2.  Pt will have improve R knee ROM to -7 to 90 to normalize gait pattern Baseline: R knee AROM -15 to 60 & PROM -7 to 62 Goal status: INITIAL  3.  Pt will be able to perform TUG <20 sec w/ AD Baseline: 29.56 sec with RW  Goal status: INITIAL   LONG TERM GOALS: Target date: 11/12/2023  Pt will be able to improve LEFS score to 40/80 to demonstrate functional ability Baseline:  LEFS 12 / 80 = 15.0 % Goal status: INITIAL  2.  Pt will report at least 60% improvement in R knee pain to improve QoL Baseline: 0/10 on eval, on average 5/10 Goal status: INITIAL  3.  Pt will be able to perform decreased TUG time in <=13.5 sec to decrease fall risk Baseline:  29.56 sec with RW  Goal status: INITIAL  4.  Pt will improve to improve overall R LE strength 4+/5 to improve stability and mobility Baseline: refer above to MMT table Goal status: INITIAL  5.  Pt will improve R knee AROM 0 to 105 to normalize gait pattern Baseline: R knee AROM -15 to 60 & PROM -7 to 62 Goal status: INITIAL  6.  Pt will be able go up a flight stairs with alternating gait pattern w/ UE support w/ SPC or single hand rail to increase independence in community access Baseline: Not tested Goal status: INITIAL   7.  Pt will be able to implement step through gait pattern to safely ambulate in community w/ LRAD Baseline: Step to gait pattern w/ flexed posture using RW Goal status: INITIAL    PLAN:  PT FREQUENCY: 3x/week - 3x/wk x 2 weeks, tapering to 2x a week for duration of POC  PT DURATION: 8 weeks  PLANNED INTERVENTIONS: 97164- PT Re-evaluation, 97110-Therapeutic exercises, 97530- Therapeutic activity, 97112- Neuromuscular re-education, 97535- Self Care, 16109- Manual therapy, [id number]- Gait training, [phone]- Electrical stimulation (manual), 97016- Vasopneumatic device, 97033- Ionotophoresis 4mg /ml Dexamethasone, Patient/Family education, Balance training, Stair training, Taping, Dry Needling, Joint mobilization, Scar mobilization, DME instructions, Cryotherapy, Moist heat, and 97750 - Physical Performance Test or Measurement  PLAN FOR NEXT SESSION: Progress HEP, improve flexibility & ROM, joint mobs   Petr Bontempo L Keelyn Monjaras, PTA 09/19/2023, 11:11 AM

## 2023-09-21 ENCOUNTER — Ambulatory Visit: Payer: BC Managed Care – PPO | Admitting: Physical Therapy

## 2023-09-21 ENCOUNTER — Encounter: Payer: Self-pay | Admitting: Physical Therapy

## 2023-09-21 DIAGNOSIS — M6281 Muscle weakness (generalized): Secondary | ICD-10-CM | POA: Diagnosis not present

## 2023-09-21 DIAGNOSIS — R2689 Other abnormalities of gait and mobility: Secondary | ICD-10-CM

## 2023-09-21 DIAGNOSIS — R600 Localized edema: Secondary | ICD-10-CM

## 2023-09-21 DIAGNOSIS — M25661 Stiffness of right knee, not elsewhere classified: Secondary | ICD-10-CM | POA: Diagnosis not present

## 2023-09-21 DIAGNOSIS — M25561 Pain in right knee: Secondary | ICD-10-CM | POA: Diagnosis not present

## 2023-09-21 NOTE — Therapy (Signed)
 OUTPATIENT PHYSICAL THERAPY LOWER EXTREMITY TREATMENT  Patient Name: Jean Hopkins MRN: [id number] DOB:[birth year], 68 y.o., female Today's Date: 09/21/2023  END OF SESSION:  PT End of Session - 09/21/23 1019     Visit Number 3    Date for PT Re-Evaluation 11/12/23    Authorization Type [employer]    PT Start Time 1019    PT Stop Time 1111    PT Time Calculation (min) 52 min    Activity Tolerance Patient tolerated treatment well    Behavior During Therapy WFL for tasks assessed/performed               Past Medical History:  Diagnosis Date   AF (atrial fibrillation) (HCC)    hyperthyroid induced   Allergy    Anemia    Arthritis 11/2020   Right knee   Blood transfusion without reported diagnosis    Cancer (HCC)    Melanoma x2   Cataract    Complication of anesthesia    Slow to wake up with general anesthesia   Controlled type 2 diabetes mellitus without complication, without long-term current use of insulin (HCC) 02/23/2022   no meds   GERD (gastroesophageal reflux disease)    Hypercholesterolemia 08/25/2022   Hypertension    Hyperthyroidism    Neuromuscular disorder (HCC)    neurpathy feet   Pneumonia    Past Surgical History:  Procedure Laterality Date   ABDOMINAL HYSTERECTOMY  2007   CHOLECYSTECTOMY  1988   TOTAL ABDOMINAL HYSTERECTOMY     TOTAL KNEE ARTHROPLASTY Right 09/13/2023   Procedure: ARTHROPLASTY, KNEE, TOTAL;  Surgeon: Durene Romans, MD;  Location: [facility];  Service: Orthopedics;  Laterality: Right;   Patient Active Problem List   Diagnosis Date Noted   S/P total knee arthroplasty, right 09/13/2023   Postmenopausal 08/15/2023   Hypercholesterolemia 08/25/2022   Personal history of malignant melanoma of skin 08/25/2022   Vitamin D deficiency 08/25/2022   Controlled type 2 diabetes mellitus without complication, without long-term current use of insulin (HCC) 02/23/2022   Hypertension 02/02/2021   Melanoma of skin (HCC) 02/02/2021   Raynaud's  phenomenon 02/02/2021   Gastroesophageal reflux disease without esophagitis 01/14/2021   Osteoarthritis of right knee 01/04/2021   Hyperthyroidism    Atrial fibrillation (HCC) 07/29/2017    PCP: Christen Butter, NP  REFERRING PROVIDER: Cassandria Anger, PA-C    REFERRING DIAG: ICD-10: M25.561: Pain in right knee  THERAPY DIAG:  Stiffness of right knee, not elsewhere classified  Other abnormalities of gait and mobility  Muscle weakness (generalized)  Acute pain of right knee  Localized edema  RATIONALE FOR EVALUATION AND TREATMENT: Rehabilitation  ONSET DATE: 09/13/2023  NEXT MD VISIT: 10/04/23   SUBJECTIVE:   SUBJECTIVE STATEMENT: Pt reports more issues with the stiffness and tightness than pain.Marland Kitchen  PERTINENT HISTORY: HTN, DM, A-Fib, Melanoma, Neuromuscular disorder, GERD  PAIN:  Are you having pain? Yes: NPRS scale: 2/10  Pain location: R anterior knee Pain description: more sharp than dull, throbbing  Aggravating factors: movement - trying to bend knee  Relieving factors: ice machine, meds  PRECAUTIONS: None  RED FLAGS: None   WEIGHT BEARING RESTRICTIONS: No  FALLS:  Has patient fallen in last 6 months? 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  DIAGNOSTIC FINDINGS: N/A  PATIENT SURVEYS:  Lower Extremity Functional Score: 12 / 80 = 15.0 %  COGNITION: Overall cognitive status: Within functional limits  for tasks assessed     SENSATION: WFL Mild neuropathy in B feet  EDEMA:  Circumferential:     R = 49.5 cm (6 cm above patella), 45.5 cm (at patella) 42.0 cm (6 cm below patella) L = R = 45.0 cm (6 cm above patella), 41.5 cm (at patella) 34.0 cm (6 cm below patella)  PALPATION: TTP  LOWER EXTREMITY ROM:  A/PROM Right eval Left eval R 09/21/23  Hip flexion     Hip extension     Hip abduction     Hip adduction     Hip internal rotation     Hip external rotation     Knee flexion 60 / 62 109 72  Knee extension -15 / -7 0 -5  Ankle dorsiflexion     Ankle plantarflexion     Ankle inversion     Ankle eversion      (Blank rows = not tested)  LOWER EXTREMITY MMT:  MMT Right eval Left eval  Hip flexion 3+ 4  Hip extension 4- 4  Hip abduction 4- 4  Hip adduction 3+ p! 4  Hip internal rotation    Hip external rotation    Knee flexion 4- 4  Knee extension 3+ p! 4  Ankle dorsiflexion    Ankle plantarflexion    Ankle inversion    Ankle eversion     (Blank rows = not tested)  LOWER EXTREMITY SPECIAL TESTS:  N/A  FUNCTIONAL TESTS:  Timed up and go (TUG): 29.56 sec with RW   GAIT: Distance walked: clinic distances Assistive device utilized: Environmental consultant - 2 wheeled Level of assistance: Modified independence Gait pattern: step to pattern, decreased step length- Right, decreased stance time- Right, and decreased hip/knee flexion- Right Comments: Performed TUG w/ pt: 29.56s                                                                                                                               TREATMENT DATE:   09/21/2023  THERAPEUTIC EXERCISE: To improve strength, endurance, ROM, and flexibility.  Demonstration, verbal and tactile cues throughout for technique.  NuStep - L3 x 6 min Seated R knee AAROM flexion with feet resting on FR x 20 Seated R heel slide 2 x 10, adding L foot overpressure for AAROM stretch into knee flexion Gravity assisted R knee flexion ROM with thigh  supported over PT's arm x 10, progressing to contract/relax into knee flexion ROM stretch Supine R quad set + leg lengthener 2 x 5,  1st set with PT providing distraction in to leg lengthener, 2nd set with pt performing leg lengthener with heel on FR Supine R quad set + SLR x 10  MANUAL THERAPY: To promote normalized muscle tension, improved flexibility, improved joint mobility, increased ROM, and reduced pain utilizing joint mobilization, connective tissue massage, and therapeutic massage. R patellar mobs - all directions - moderate restriction noted STM and IASTM with foam roller to to quads and hip adductors Manual edema reabsorption techniques  MODALITIES: Game Ready vasopneumatic compression post session to R knee x 10 min, high compression, 34 to reduce post-exercise pain and swelling/edema   09/19/23 Therapeutic Exercise: to improve strength, ROM, flexibility, and endurance  Nustep L3x25min Quad sets towel under knee 10x5"- cues to avoid glute activation SLR R x 10 Supine heel slides AROM x 10; AAROM with strap x 10 Supine L SKTC for R hip flexor elongation 2x30" Standing R hip flexor stretch x 30" R LAQ x 10  NEUROMUSCULAR RE-EDUCATION: To improve proprioception, balance, and kinesthesia. Church pew 2x10 Retro step: RLE behind/L in front 2x10  GAIT TRAINING: To normalize gait pattern and improve safety. 360' with RW- decreased heel strike, decreased hip-knee flexion, excessive R hip rotation on weight acceptance   09/17/2023  SELF CARE: Provided education to improve safety with use of RW for assistive device, to reduce fall risk, to promote safe home environment, and to increase independence with ADLs. Adjust RW height to improve posture, cueing on performing step through gait pattern, educate pt on importance of heel strike in gait pattern to activate quads, continue with hospital HEP to improve ROM and flexibility, educate pt on knee positioning w/ pillow or wedge to prevent  flexion contracture when sitting   PATIENT EDUCATION:  Education details: continue with current HEP Person educated: Patient Education method: Explanation Education comprehension: verbalized understanding and needs further education  HOME EXERCISE PROGRAM: Currently performing hospital HEP  Access Code: [email or web address] URL: [email or web address] Date: 09/19/2023 Prepared by: Verta Ellen  Exercises - Church Pew  - 1 x daily - 7 x weekly - 2-3 sets - 10 reps - Stride Stance Weight Shift  - 1 x daily - 7 x weekly - 2-3 sets - 10 reps - Gastroc Stretch on Wall  - 1 x daily - 7 x weekly - 3 sets - 3 reps - 30 seconds hold   ASSESSMENT:  CLINICAL IMPRESSION: Jean Hopkins reports more stiffness/tightness as opposed to pain today.  Provided guidance in modifications for and progression of exercises to help promote increased R knee ROM.  Increased muscle tension/tightness very evident in quads and hip adductors which was addressed with STM and IASTIM followed by manual edema reabsorption techniques. She was able to increased R knee AROM to -5 to 72 by end of session.  Session concluded with GR vasopneumatic compression to reduce post-exercise pain and edema.  Jean Hopkins will benefit from continued skilled PT to address ongoing pain, ROM, strength and balance deficits to improve mobility and activity tolerance with decreased pain interference.   OBJECTIVE IMPAIRMENTS: Abnormal gait, decreased activity tolerance, decreased endurance, decreased knowledge of use of DME, decreased mobility, difficulty walking, decreased ROM, decreased strength, increased edema, impaired perceived functional ability, impaired flexibility, improper body mechanics, postural dysfunction, and pain.   ACTIVITY LIMITATIONS: carrying, lifting, bending, standing, squatting, stairs, transfers, bed mobility, bathing, toileting, dressing, and locomotion level  PARTICIPATION LIMITATIONS: cleaning, laundry, driving, community  activity, and occupation  PERSONAL FACTORS: Age, Fitness, and 3+ comorbidities: HTN, DM, A-Fib, Melanoma, Neuromuscular disorder,  GERD  are also affecting patient's functional outcome.   REHAB POTENTIAL: Excellent  CLINICAL DECISION MAKING: Evolving/moderate complexity  EVALUATION COMPLEXITY: Moderate   GOALS: Goals reviewed with patient? Yes  SHORT TERM GOALS: Target date: 10/15/2023  Pt will be independent w/ HEP Baseline: Goal status: IN PROGRESS   2.  Pt will have improve R knee ROM to -7 to 90 to normalize gait pattern Baseline: R knee AROM -15 to 60 & PROM -7 to 62 Goal status: IN PROGRESS   3.  Pt will be able to perform TUG <20 sec w/ AD Baseline: 29.56 sec with RW  Goal status: IN PROGRESS    LONG TERM GOALS: Target date: 11/12/2023  Pt will be able to improve LEFS score to 40/80 to demonstrate functional ability Baseline:  LEFS 12 / 80 = 15.0 % Goal status: IN PROGRESS   2.  Pt will report at least 60% improvement in R knee pain to improve QoL Baseline: 0/10 on eval, on average 5/10 Goal status: IN PROGRESS   3.  Pt will be able to perform decreased TUG time in <=13.5 sec to decrease fall risk Baseline: 29.56 sec with RW  Goal status: IN PROGRESS   4.  Pt will improve to improve overall R LE strength 4+/5 to improve stability and mobility Baseline: refer above to MMT table Goal status: IN PROGRESS   5.  Pt will improve R knee AROM 0 to 105 to normalize gait pattern Baseline: R knee AROM -15 to 60 & PROM -7 to 62 Goal status: IN PROGRESS   6.  Pt will be able go up a flight stairs with alternating gait pattern w/ UE support w/ SPC or single hand rail to increase independence in community access Baseline: Not tested Goal status: IN PROGRESS   7.  Pt will be able to implement step through gait pattern to safely ambulate in community w/ LRAD Baseline: Step to gait pattern w/ flexed posture using RW Goal status: IN PROGRESS   PLAN:  PT FREQUENCY:  3x/week - 3x/wk x 2 weeks, tapering to 2x a week for duration of POC  PT DURATION: 8 weeks  PLANNED INTERVENTIONS: 97164- PT Re-evaluation, 97110-Therapeutic exercises, 97530- Therapeutic activity, 97112- Neuromuscular re-education, 97535- Self Care, 16109- Manual therapy, [id number]- Gait training, [phone]- Electrical stimulation (manual), 97016- Vasopneumatic device, 97033- Ionotophoresis 4mg /ml Dexamethasone, Patient/Family education, Balance training, Stair training, Taping, Dry Needling, Joint mobilization, Scar mobilization, DME instructions, Cryotherapy, Moist heat, and 97750 - Physical Performance Test or Measurement  PLAN FOR NEXT SESSION: Progress HEP, improve proximal LE flexibility & R knee ROM, joint mobs, LE strengthening, gait training working toward weaning to [facility]   Marry Guan, PT 09/21/2023, 11:34 AM

## 2023-09-24 ENCOUNTER — Ambulatory Visit: Payer: BC Managed Care – PPO

## 2023-09-24 DIAGNOSIS — R600 Localized edema: Secondary | ICD-10-CM | POA: Diagnosis not present

## 2023-09-24 DIAGNOSIS — M6281 Muscle weakness (generalized): Secondary | ICD-10-CM | POA: Diagnosis not present

## 2023-09-24 DIAGNOSIS — M25561 Pain in right knee: Secondary | ICD-10-CM | POA: Diagnosis not present

## 2023-09-24 DIAGNOSIS — M25661 Stiffness of right knee, not elsewhere classified: Secondary | ICD-10-CM | POA: Diagnosis not present

## 2023-09-24 DIAGNOSIS — R2689 Other abnormalities of gait and mobility: Secondary | ICD-10-CM

## 2023-09-24 NOTE — Therapy (Signed)
 OUTPATIENT PHYSICAL THERAPY LOWER EXTREMITY TREATMENT  Patient Name: Jean Hopkins MRN: [id number] DOB:01/07/1956, 68 y.o., female Today's Date: 09/24/2023  END OF SESSION:  PT End of Session - 09/24/23 1024     Visit Number 4    Date for PT Re-Evaluation 11/12/23    Authorization Type [employer]    PT Start Time 1017    PT Stop Time 1106    PT Time Calculation (min) 49 min    Activity Tolerance Patient tolerated treatment well    Behavior During Therapy WFL for tasks assessed/performed                Past Medical History:  Diagnosis Date   AF (atrial fibrillation) (HCC)    hyperthyroid induced   Allergy    Anemia    Arthritis 11/2020   Right knee   Blood transfusion without reported diagnosis    Cancer (HCC)    Melanoma x2   Cataract    Complication of anesthesia    Slow to wake up with general anesthesia   Controlled type 2 diabetes mellitus without complication, without long-term current use of insulin (HCC) 02/23/2022   no meds   GERD (gastroesophageal reflux disease)    Hypercholesterolemia 08/25/2022   Hypertension    Hyperthyroidism    Neuromuscular disorder (HCC)    neurpathy feet   Pneumonia    Past Surgical History:  Procedure Laterality Date   ABDOMINAL HYSTERECTOMY  2007   CHOLECYSTECTOMY  1988   TOTAL ABDOMINAL HYSTERECTOMY     TOTAL KNEE ARTHROPLASTY Right 09/13/2023   Procedure: ARTHROPLASTY, KNEE, TOTAL;  Surgeon: Durene Romans, MD;  Location: [facility];  Service: Orthopedics;  Laterality: Right;   Patient Active Problem List   Diagnosis Date Noted   S/P total knee arthroplasty, right 09/13/2023   Postmenopausal 08/15/2023   Hypercholesterolemia 08/25/2022   Personal history of malignant melanoma of skin 08/25/2022   Vitamin D deficiency 08/25/2022   Controlled type 2 diabetes mellitus without complication, without long-term current use of insulin (HCC) 02/23/2022   Hypertension 02/02/2021   Melanoma of skin (HCC) 02/02/2021   Raynaud's  phenomenon 02/02/2021   Gastroesophageal reflux disease without esophagitis 01/14/2021   Osteoarthritis of right knee 01/04/2021   Hyperthyroidism    Atrial fibrillation (HCC) 07/29/2017    PCP: Christen Butter, NP  REFERRING PROVIDER: Cassandria Anger, PA-C    REFERRING DIAG: ICD-10: M25.561: Pain in right knee  THERAPY DIAG:  Stiffness of right knee, not elsewhere classified  Other abnormalities of gait and mobility  Muscle weakness (generalized)  Acute pain of right knee  Localized edema  RATIONALE FOR EVALUATION AND TREATMENT: Rehabilitation  ONSET DATE: 09/13/2023  NEXT MD VISIT: 10/04/23   SUBJECTIVE:   SUBJECTIVE STATEMENT: Pt reports she feels like she is doing better.  PERTINENT HISTORY: HTN, DM, A-Fib, Melanoma, Neuromuscular disorder, GERD  PAIN:  Are you having pain? Yes: NPRS scale: 2/10  Pain location: R anterior knee Pain description: more sharp than dull, throbbing  Aggravating factors: movement - trying to bend knee  Relieving factors: ice machine, meds  PRECAUTIONS: None  RED FLAGS: None   WEIGHT BEARING RESTRICTIONS: No  FALLS:  Has patient fallen in last 6 months? 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  DIAGNOSTIC FINDINGS: N/A  PATIENT SURVEYS:  Lower Extremity Functional Score: 12 / 80 = 15.0 %  COGNITION: Overall cognitive status: Within functional limits for tasks  assessed     SENSATION: WFL Mild neuropathy in B feet  EDEMA:  Circumferential:     R = 49.5 cm (6 cm above patella), 45.5 cm (at patella) 42.0 cm (6 cm below patella) L = R = 45.0 cm (6 cm above patella), 41.5 cm (at patella) 34.0 cm (6 cm below patella)  PALPATION: TTP  LOWER EXTREMITY ROM:  A/PROM Right eval Left eval R 09/21/23  Hip flexion     Hip extension     Hip abduction     Hip adduction     Hip internal rotation     Hip external rotation     Knee flexion 60 / 62 109 72  Knee extension -15 / -7 0 -5  Ankle dorsiflexion     Ankle plantarflexion     Ankle inversion     Ankle eversion      (Blank rows = not tested)  LOWER EXTREMITY MMT:  MMT Right eval Left eval  Hip flexion 3+ 4  Hip extension 4- 4  Hip abduction 4- 4  Hip adduction 3+ p! 4  Hip internal rotation    Hip external rotation    Knee flexion 4- 4  Knee extension 3+ p! 4  Ankle dorsiflexion    Ankle plantarflexion    Ankle inversion    Ankle eversion     (Blank rows = not tested)  LOWER EXTREMITY SPECIAL TESTS:  N/A  FUNCTIONAL TESTS:  Timed up and go (TUG): 29.56 sec with RW   GAIT: Distance walked: clinic distances Assistive device utilized: Environmental consultant - 2 wheeled Level of assistance: Modified independence Gait pattern: step to pattern, decreased step length- Right, decreased stance time- Right, and decreased hip/knee flexion- Right Comments: Performed TUG w/ pt: 29.56s                                                                                                                               TREATMENT DATE:  09/24/2023  THERAPEUTIC EXERCISE: To improve strength, endurance, ROM, and flexibility.  Demonstration, verbal and tactile cues throughout for technique.  NuStep - L5 x 6 min Supine knee flexion hang towel under knee 2 x 10 Supine heel slides 2x10 AAROM with strap Supine QS towel under knee 10x5" Long sitting gastroc stretch 2x30" Manual PROM and prolonged stretching for knee  flexion mostly some stretching for extension  NEUROMUSCULAR RE-EDUCATION: To improve proprioception, balance, and kinesthesia. Church pews x 20 Retro steps x 20  Standing heel/toe raises 2 x  10 09/21/2023  THERAPEUTIC EXERCISE: To improve strength, endurance, ROM, and flexibility.  Demonstration, verbal and tactile cues throughout for technique.  NuStep - L3 x 6 min Seated R knee AAROM flexion with feet resting on FR x 20 Seated R heel slide 2 x 10, adding L foot overpressure for AAROM stretch into knee flexion Gravity assisted R knee flexion ROM with thigh supported over PT's arm x 10, progressing to contract/relax into knee flexion ROM stretch Supine R quad set + leg lengthener 2 x 5, 1st set with PT providing distraction in to leg lengthener, 2nd set with pt performing leg lengthener with heel on FR Supine R quad set + SLR x 10  MANUAL THERAPY: To promote normalized muscle tension, improved flexibility, improved joint mobility, increased ROM, and reduced pain utilizing joint mobilization, connective tissue massage, and therapeutic massage. R patellar mobs - all directions - moderate restriction noted STM and IASTM with foam roller to to quads and hip adductors Manual edema reabsorption techniques  MODALITIES: Game [employer] vasopneumatic compression post session to R knee x 10 min, high compression, 34 to reduce post-exercise pain and swelling/edema   09/19/23 Therapeutic Exercise: to improve strength, ROM, flexibility, and endurance  Nustep L3x87min Quad sets towel under knee 10x5"- cues to avoid glute activation SLR R x 10 Supine heel slides AROM x 10; AAROM with strap x 10 Supine L SKTC for R hip flexor elongation 2x30" Standing R hip flexor stretch x 30" R LAQ x 10  NEUROMUSCULAR RE-EDUCATION: To improve proprioception, balance, and kinesthesia. Church pew 2x10 Retro step: RLE behind/L in front 2x10  GAIT TRAINING: To normalize gait pattern and improve safety. 360' with RW-  decreased heel strike, decreased hip-knee flexion, excessive R hip rotation on weight acceptance   09/17/2023  SELF CARE: Provided education to improve safety with use of RW for assistive device, to reduce fall risk, to promote safe home environment, and to increase independence with ADLs. Adjust RW height to improve posture, cueing on performing step through gait pattern, educate pt on importance of heel strike in gait pattern to activate quads, continue with hospital HEP to improve ROM and flexibility, educate pt on knee positioning w/ pillow or wedge to prevent flexion contracture when sitting   PATIENT EDUCATION:  Education details: continue with current HEP Person educated: Patient Education method: Explanation Education comprehension: verbalized understanding and needs further education  HOME EXERCISE PROGRAM: Currently performing hospital HEP  Access Code: [email or web address] URL: [email or web address] Date: 09/19/2023 Prepared by: Verta Ellen  Exercises - Church Pew  - 1 x daily - 7 x weekly - 2-3 sets - 10 reps - Stride Stance Weight Shift  - 1 x daily - 7 x weekly - 2-3 sets - 10 reps - Gastroc Stretch on Wall  - 1 x daily - 7 x weekly - 3 sets - 3 reps - 30 seconds hold   ASSESSMENT:  CLINICAL IMPRESSION:  Pt showed a good response to treatment. Worked on standing exercises for proprioception and to improve WB tolerance. Her knee was stiff with flexion initially but after manual stretching the stiffness reduced. GR post session to address swelling and pain post exercise.  Reita will benefit from continued skilled PT to address ongoing pain, ROM, strength and balance deficits to improve mobility and activity tolerance with decreased pain interference.   OBJECTIVE IMPAIRMENTS: Abnormal gait, decreased activity tolerance, decreased endurance, decreased knowledge of use of DME, decreased mobility, difficulty walking, decreased ROM, decreased strength, increased edema,  impaired perceived functional  ability, impaired flexibility, improper body mechanics, postural dysfunction, and pain.   ACTIVITY LIMITATIONS: carrying, lifting, bending, standing, squatting, stairs, transfers, bed mobility, bathing, toileting, dressing, and locomotion level  PARTICIPATION LIMITATIONS: cleaning, laundry, driving, community activity, and occupation  PERSONAL FACTORS: Age, Fitness, and 3+ comorbidities: HTN, DM, A-Fib, Melanoma, Neuromuscular disorder, GERD  are also affecting patient's functional outcome.   REHAB POTENTIAL: Excellent  CLINICAL DECISION MAKING: Evolving/moderate complexity  EVALUATION COMPLEXITY: Moderate   GOALS: Goals reviewed with patient? Yes  SHORT TERM GOALS: Target date: 10/15/2023  Pt will be independent w/ HEP Baseline: Goal status: IN PROGRESS   2.  Pt will have improve R knee ROM to -7 to 90 to normalize gait pattern Baseline: R knee AROM -15 to 60 & PROM -7 to 62 Goal status: IN PROGRESS   3.  Pt will be able to perform TUG <20 sec w/ AD Baseline: 29.56 sec with RW  Goal status: IN PROGRESS    LONG TERM GOALS: Target date: 11/12/2023  Pt will be able to improve LEFS score to 40/80 to demonstrate functional ability Baseline:  LEFS 12 / 80 = 15.0 % Goal status: IN PROGRESS   2.  Pt will report at least 60% improvement in R knee pain to improve QoL Baseline: 0/10 on eval, on average 5/10 Goal status: IN PROGRESS   3.  Pt will be able to perform decreased TUG time in <=13.5 sec to decrease fall risk Baseline: 29.56 sec with RW  Goal status: IN PROGRESS   4.  Pt will improve to improve overall R LE strength 4+/5 to improve stability and mobility Baseline: refer above to MMT table Goal status: IN PROGRESS   5.  Pt will improve R knee AROM 0 to 105 to normalize gait pattern Baseline: R knee AROM -15 to 60 & PROM -7 to 62 Goal status: IN PROGRESS   6.  Pt will be able go up a flight stairs with alternating gait pattern w/ UE  support w/ SPC or single hand rail to increase independence in community access Baseline: Not tested Goal status: IN PROGRESS   7.  Pt will be able to implement step through gait pattern to safely ambulate in community w/ LRAD Baseline: Step to gait pattern w/ flexed posture using RW Goal status: IN PROGRESS   PLAN:  PT FREQUENCY: 3x/week - 3x/wk x 2 weeks, tapering to 2x a week for duration of POC  PT DURATION: 8 weeks  PLANNED INTERVENTIONS: 97164- PT Re-evaluation, 97110-Therapeutic exercises, 97530- Therapeutic activity, 97112- Neuromuscular re-education, 97535- Self Care, [id number]- Manual therapy, [id number]- Gait training, [phone]- Electrical stimulation (manual), 97016- Vasopneumatic device, 97033- Ionotophoresis 4mg /ml Dexamethasone, Patient/Family education, Balance training, Stair training, Taping, Dry Needling, Joint mobilization, Scar mobilization, DME instructions, Cryotherapy, Moist heat, and 97750 - Physical Performance Test or Measurement  PLAN FOR NEXT SESSION: Progress HEP, improve proximal LE flexibility & R knee ROM, joint mobs, LE strengthening, gait training working toward weaning to [employer], PTA 09/24/2023, 11:19 AM

## 2023-09-26 ENCOUNTER — Ambulatory Visit: Payer: BC Managed Care – PPO | Attending: Student

## 2023-09-26 DIAGNOSIS — M25561 Pain in right knee: Secondary | ICD-10-CM | POA: Diagnosis not present

## 2023-09-26 DIAGNOSIS — R2689 Other abnormalities of gait and mobility: Secondary | ICD-10-CM | POA: Diagnosis not present

## 2023-09-26 DIAGNOSIS — M25661 Stiffness of right knee, not elsewhere classified: Secondary | ICD-10-CM | POA: Insufficient documentation

## 2023-09-26 DIAGNOSIS — M6281 Muscle weakness (generalized): Secondary | ICD-10-CM | POA: Insufficient documentation

## 2023-09-26 DIAGNOSIS — R600 Localized edema: Secondary | ICD-10-CM | POA: Insufficient documentation

## 2023-09-26 NOTE — Therapy (Signed)
 OUTPATIENT PHYSICAL THERAPY LOWER EXTREMITY TREATMENT  Patient Name: Jean Hopkins MRN: [id number] DOB:April 30, 1956, 68 y.o., female Today's Date: 09/26/2023  END OF SESSION:  PT End of Session - 09/26/23 1040     Visit Number 5    Date for PT Re-Evaluation 11/12/23    Authorization Type [employer]    PT Start Time 1020    PT Stop Time 1111    PT Time Calculation (min) 51 min    Activity Tolerance Patient tolerated treatment well    Behavior During Therapy WFL for tasks assessed/performed                 Past Medical History:  Diagnosis Date   AF (atrial fibrillation) (HCC)    hyperthyroid induced   Allergy    Anemia    Arthritis 11/2020   Right knee   Blood transfusion without reported diagnosis    Cancer (HCC)    Melanoma x2   Cataract    Complication of anesthesia    Slow to wake up with general anesthesia   Controlled type 2 diabetes mellitus without complication, without long-term current use of insulin (HCC) 02/23/2022   no meds   GERD (gastroesophageal reflux disease)    Hypercholesterolemia 08/25/2022   Hypertension    Hyperthyroidism    Neuromuscular disorder (HCC)    neurpathy feet   Pneumonia    Past Surgical History:  Procedure Laterality Date   ABDOMINAL HYSTERECTOMY  2007   CHOLECYSTECTOMY  1988   TOTAL ABDOMINAL HYSTERECTOMY     TOTAL KNEE ARTHROPLASTY Right 09/13/2023   Procedure: ARTHROPLASTY, KNEE, TOTAL;  Surgeon: Durene Romans, MD;  Location: [facility];  Service: Orthopedics;  Laterality: Right;   Patient Active Problem List   Diagnosis Date Noted   S/P total knee arthroplasty, right 09/13/2023   Postmenopausal 08/15/2023   Hypercholesterolemia 08/25/2022   Personal history of malignant melanoma of skin 08/25/2022   Vitamin D deficiency 08/25/2022   Controlled type 2 diabetes mellitus without complication, without long-term current use of insulin (HCC) 02/23/2022   Hypertension 02/02/2021   Melanoma of skin (HCC) 02/02/2021   Raynaud's  phenomenon 02/02/2021   Gastroesophageal reflux disease without esophagitis 01/14/2021   Osteoarthritis of right knee 01/04/2021   Hyperthyroidism    Atrial fibrillation (HCC) 07/29/2017    PCP: Christen Butter, NP  REFERRING PROVIDER: Cassandria Anger, PA-C    REFERRING DIAG: ICD-10: M25.561: Pain in right knee  THERAPY DIAG:  Stiffness of right knee, not elsewhere classified  Other abnormalities of gait and mobility  Muscle weakness (generalized)  Acute pain of right knee  Localized edema  RATIONALE FOR EVALUATION AND TREATMENT: Rehabilitation  ONSET DATE: 09/13/2023  NEXT MD VISIT: 10/04/23   SUBJECTIVE:   SUBJECTIVE STATEMENT: "Feels like a little more movement in the knee. Brought her cane in today to review her gait with it."  PERTINENT HISTORY: HTN, DM, A-Fib, Melanoma, Neuromuscular disorder, GERD  PAIN:  Are you having pain? Yes: NPRS scale: 1/10  Pain location: R anterior knee Pain description: more sharp than dull, throbbing  Aggravating factors: movement - trying to bend knee  Relieving factors: ice machine, meds  PRECAUTIONS: None  RED FLAGS: None   WEIGHT BEARING RESTRICTIONS: No  FALLS:  Has patient fallen in last 6 months? 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  DIAGNOSTIC FINDINGS: N/A  PATIENT SURVEYS:  Lower Extremity Functional Score: 12 / 80 = 15.0 %  COGNITION: Overall cognitive  status: Within functional limits for tasks assessed     SENSATION: WFL Mild neuropathy in B feet  EDEMA:  Circumferential:     R = 49.5 cm (6 cm above patella), 45.5 cm (at patella) 42.0 cm (6 cm below patella) L = R = 45.0 cm (6 cm above patella), 41.5 cm (at patella) 34.0 cm (6 cm below patella)  PALPATION: TTP  LOWER EXTREMITY ROM:  A/PROM Right eval Left eval R 09/21/23  Hip flexion     Hip extension     Hip abduction     Hip adduction     Hip internal rotation     Hip external rotation     Knee flexion 60 / 62 109 72  Knee extension -15 / -7 0 -5  Ankle dorsiflexion     Ankle plantarflexion     Ankle inversion     Ankle eversion      (Blank rows = not tested)  LOWER EXTREMITY MMT:  MMT Right eval Left eval  Hip flexion 3+ 4  Hip extension 4- 4  Hip abduction 4- 4  Hip adduction 3+ p! 4  Hip internal rotation    Hip external rotation    Knee flexion 4- 4  Knee extension 3+ p! 4  Ankle dorsiflexion    Ankle plantarflexion    Ankle inversion    Ankle eversion     (Blank rows = not tested)  LOWER EXTREMITY SPECIAL TESTS:  N/A  FUNCTIONAL TESTS:  Timed up and go (TUG): 29.56 sec with RW   GAIT: Distance walked: clinic distances Assistive device utilized: Environmental consultant - 2 wheeled Level of assistance: Modified independence Gait pattern: step to pattern, decreased step length- Right, decreased stance time- Right, and decreased hip/knee flexion- Right Comments: Performed TUG w/ pt: 29.56s                                                                                                                               TREATMENT DATE:  09/26/2023  THERAPEUTIC EXERCISE: To improve strength, endurance, ROM, and flexibility.  Demonstration, verbal and tactile cues throughout for technique. Bike partial revolutions x 6 min Knee flexion stretch lunge on 6' step 10x5" Knee flexion stretch lateral lunge on 6' step 10x5" Retro step x 15 RLE back LLE fwd Church pew x 15 Prone  R TKE 10x7"; 2 sets Prone knee flexion PROM Supine heel slides  GAIT TRAINING: To normalize gait pattern and improve safety.  With SPC 2x90'- decreased hip/knee flexion,antalgic gait MODALITIES: Game Ready vasopneumatic compression post session to R knee x 10 min, med compression, 34 to reduce post-exercise pain and swelling/edema 09/24/2023  THERAPEUTIC EXERCISE: To improve strength, endurance, ROM, and flexibility.  Demonstration, verbal and tactile cues throughout for technique.  NuStep - L5 x 6 min Supine knee flexion hang towel under knee 2 x 10 Supine heel slides 2x10 AAROM with strap Supine QS towel under knee 10x5" Long sitting gastroc stretch 2x30" Manual PROM and prolonged stretching for knee flexion mostly some stretching for extension  NEUROMUSCULAR RE-EDUCATION: To improve proprioception, balance, and kinesthesia. Church pews x 20 Retro steps x 20  Standing heel/toe raises 2 x 10 09/21/2023  THERAPEUTIC EXERCISE: To improve strength, endurance, ROM, and flexibility.  Demonstration, verbal and tactile cues throughout for technique.  NuStep - L3 x 6 min Seated R knee AAROM flexion with feet resting on FR x 20 Seated R heel slide 2 x 10, adding L foot overpressure for AAROM stretch into knee flexion Gravity assisted R knee flexion ROM with thigh supported over PT's arm x 10, progressing to contract/relax into knee flexion ROM stretch Supine R quad set + leg lengthener 2 x 5, 1st set with PT providing distraction in to leg lengthener, 2nd set with pt performing leg lengthener with heel on FR Supine R quad set + SLR x 10  MANUAL THERAPY: To promote normalized muscle tension, improved flexibility, improved joint mobility, increased ROM, and reduced pain utilizing joint mobilization, connective tissue massage, and therapeutic massage. R patellar mobs - all directions - moderate restriction noted STM and IASTM with foam roller to to quads and hip adductors Manual edema reabsorption  techniques  MODALITIES: Game Ready vasopneumatic compression post session to R knee x 10 min, high compression, 34 to reduce post-exercise pain and swelling/edema   09/19/23 Therapeutic Exercise: to improve strength, ROM, flexibility, and endurance  Nustep L3x63min Quad sets towel under knee 10x5"- cues to avoid glute activation SLR R x 10 Supine heel slides AROM x 10; AAROM with strap x 10 Supine L SKTC for R hip flexor elongation 2x30" Standing R hip flexor stretch x 30" R LAQ x 10  NEUROMUSCULAR RE-EDUCATION: To improve proprioception, balance, and kinesthesia. Church pew 2x10 Retro step: RLE behind/L in front 2x10  GAIT TRAINING: To normalize gait pattern and improve safety. 360' with RW- decreased heel strike, decreased hip-knee flexion, excessive R hip rotation on weight acceptance   09/17/2023  SELF CARE: Provided education to improve safety with use of RW for assistive device, to reduce fall risk, to promote safe home environment, and to increase independence with ADLs. Adjust RW height to improve posture, cueing on performing step through gait pattern, educate pt on importance of heel strike in gait pattern to activate quads, continue with hospital HEP to improve ROM and flexibility, educate pt on knee positioning w/ pillow or wedge to prevent flexion contracture when sitting   PATIENT EDUCATION:  Education details: continue with current HEP Person educated: Patient Education method: Explanation Education comprehension: verbalized understanding and needs further education  HOME EXERCISE PROGRAM: Currently performing hospital HEP  Access Code: [email or web address] URL: [email or web address] Date: 09/26/2023 Prepared by: Verta Ellen  Exercises - Church Pew  - 1 x daily - 7 x weekly - 2-3 sets - 10 reps - Stride Stance Weight Shift  - 1 x daily - 7 x weekly - 2-3 sets - 10 reps - Gastroc Stretch on Wall  - 1 x daily -  7 x weekly - 3 sets - 3 reps - 30 seconds  hold - Standing Knee Flexion Stretch on Step  - 1 x daily - 7 x weekly - 2 sets - 10 reps - 5 second hold - Prone Terminal Knee Extension  - 1 x daily - 7 x weekly - 2 sets - 10 reps - 5 secons hold   ASSESSMENT:  CLINICAL IMPRESSION: Pt continues to be stiff with L knee flexion. Emphasized improving ROM with the exercises today and working on proprioception to improve stability. Her gait with a SPC looks fine, just needs a little more practice for a smooth gait pattern. GR post session to address swelling and pain post exercise.  Jean Hopkins will benefit from continued skilled PT to address ongoing pain, ROM, strength and balance deficits to improve mobility and activity tolerance with decreased pain interference.   OBJECTIVE IMPAIRMENTS: Abnormal gait, decreased activity tolerance, decreased endurance, decreased knowledge of use of DME, decreased mobility, difficulty walking, decreased ROM, decreased strength, increased edema, impaired perceived functional ability, impaired flexibility, improper body mechanics, postural dysfunction, and pain.   ACTIVITY LIMITATIONS: carrying, lifting, bending, standing, squatting, stairs, transfers, bed mobility, bathing, toileting, dressing, and locomotion level  PARTICIPATION LIMITATIONS: cleaning, laundry, driving, community activity, and occupation  PERSONAL FACTORS: Age, Fitness, and 3+ comorbidities: HTN, DM, A-Fib, Melanoma, Neuromuscular disorder, GERD  are also affecting patient's functional outcome.   REHAB POTENTIAL: Excellent  CLINICAL DECISION MAKING: Evolving/moderate complexity  EVALUATION COMPLEXITY: Moderate   GOALS: Goals reviewed with patient? Yes  SHORT TERM GOALS: Target date: 10/15/2023  Pt will be independent w/ HEP Baseline: Goal status: IN PROGRESS   2.  Pt will have improve R knee ROM to -7 to 90 to normalize gait pattern Baseline: R knee AROM -15 to 60 & PROM -7 to 62 Goal status: IN PROGRESS   3.  Pt will be able to  perform TUG <20 sec w/ AD Baseline: 29.56 sec with RW  Goal status: IN PROGRESS    LONG TERM GOALS: Target date: 11/12/2023  Pt will be able to improve LEFS score to 40/80 to demonstrate functional ability Baseline:  LEFS 12 / 80 = 15.0 % Goal status: IN PROGRESS   2.  Pt will report at least 60% improvement in R knee pain to improve QoL Baseline: 0/10 on eval, on average 5/10 Goal status: IN PROGRESS   3.  Pt will be able to perform decreased TUG time in <=13.5 sec to decrease fall risk Baseline: 29.56 sec with RW  Goal status: IN PROGRESS   4.  Pt will improve to improve overall R LE strength 4+/5 to improve stability and mobility Baseline: refer above to MMT table Goal status: IN PROGRESS   5.  Pt will improve R knee AROM 0 to 105 to normalize gait pattern Baseline: R knee AROM -15 to 60 & PROM -7 to 62 Goal status: IN PROGRESS   6.  Pt will be able go up a flight stairs with alternating gait pattern w/ UE support w/ SPC or single hand rail to increase independence in community access Baseline: Not tested Goal status: IN PROGRESS   7.  Pt will be able to implement step through gait pattern to safely ambulate in community w/ LRAD Baseline: Step to gait pattern w/ flexed posture using RW Goal status: IN PROGRESS   PLAN:  PT FREQUENCY: 3x/week - 3x/wk x 2 weeks, tapering to 2x a week for duration of POC  PT DURATION: 8 weeks  PLANNED INTERVENTIONS: 97164- PT Re-evaluation, 97110-Therapeutic exercises, 97530- Therapeutic activity, [id number]- Neuromuscular re-education, [phone]- Self Care, [id number]- Manual therapy, [phone]- Gait training, [phone]- Electrical stimulation (manual), 97016- Vasopneumatic device, [phone]- Ionotophoresis 4mg /ml Dexamethasone, Patient/Family education, Balance training, Stair training, Taping, Dry Needling, Joint mobilization, Scar mobilization, DME instructions, Cryotherapy, Moist heat, and 97750 - Physical Performance Test or Measurement  PLAN FOR NEXT SESSION:  Progress HEP, improve proximal LE flexibility & R knee ROM, joint mobs, LE strengthening, gait training working toward weaning to [employer], PTA 09/26/2023, 11:04 AM

## 2023-09-28 ENCOUNTER — Ambulatory Visit: Payer: BC Managed Care – PPO | Admitting: Physical Therapy

## 2023-09-28 ENCOUNTER — Encounter: Payer: Self-pay | Admitting: Physical Therapy

## 2023-09-28 DIAGNOSIS — M25661 Stiffness of right knee, not elsewhere classified: Secondary | ICD-10-CM | POA: Diagnosis not present

## 2023-09-28 DIAGNOSIS — R600 Localized edema: Secondary | ICD-10-CM

## 2023-09-28 DIAGNOSIS — R2689 Other abnormalities of gait and mobility: Secondary | ICD-10-CM | POA: Diagnosis not present

## 2023-09-28 DIAGNOSIS — M6281 Muscle weakness (generalized): Secondary | ICD-10-CM | POA: Diagnosis not present

## 2023-09-28 DIAGNOSIS — M25561 Pain in right knee: Secondary | ICD-10-CM

## 2023-09-28 NOTE — Therapy (Signed)
 OUTPATIENT PHYSICAL THERAPY LOWER EXTREMITY TREATMENT  Patient Name: Jean Hopkins MRN: [id number] DOB:10/26/1955, 68 y.o., female Today's Date: 09/28/2023  END OF SESSION:  PT End of Session - 09/28/23 1019     Visit Number 6    Date for PT Re-Evaluation 11/12/23    Authorization Type BCBS    PT Start Time 1019    PT Stop Time 1112    PT Time Calculation (min) 53 min    Activity Tolerance Patient tolerated treatment well    Behavior During Therapy WFL for tasks assessed/performed                  Past Medical History:  Diagnosis Date   AF (atrial fibrillation) (HCC)    hyperthyroid induced   Allergy    Anemia    Arthritis 11/2020   Right knee   Blood transfusion without reported diagnosis    Cancer (HCC)    Melanoma x2   Cataract    Complication of anesthesia    Slow to wake up with general anesthesia   Controlled type 2 diabetes mellitus without complication, without long-term current use of insulin (HCC) 02/23/2022   no meds   GERD (gastroesophageal reflux disease)    Hypercholesterolemia 08/25/2022   Hypertension    Hyperthyroidism    Neuromuscular disorder (HCC)    neurpathy feet   Pneumonia    Past Surgical History:  Procedure Laterality Date   ABDOMINAL HYSTERECTOMY  2007   CHOLECYSTECTOMY  1988   TOTAL ABDOMINAL HYSTERECTOMY     TOTAL KNEE ARTHROPLASTY Right 09/13/2023   Procedure: ARTHROPLASTY, KNEE, TOTAL;  Surgeon: Durene Romans, MD;  Location: [facility];  Service: Orthopedics;  Laterality: Right;   Patient Active Problem List   Diagnosis Date Noted   S/P total knee arthroplasty, right 09/13/2023   Postmenopausal 08/15/2023   Hypercholesterolemia 08/25/2022   Personal history of malignant melanoma of skin 08/25/2022   Vitamin D deficiency 08/25/2022   Controlled type 2 diabetes mellitus without complication, without long-term current use of insulin (HCC) 02/23/2022   Hypertension 02/02/2021   Melanoma of skin (HCC) 02/02/2021   Raynaud's  phenomenon 02/02/2021   Gastroesophageal reflux disease without esophagitis 01/14/2021   Osteoarthritis of right knee 01/04/2021   Hyperthyroidism    Atrial fibrillation (HCC) 07/29/2017    PCP: Christen Butter, NP  REFERRING PROVIDER: Cassandria Anger, PA-C    REFERRING DIAG: ICD-10: M25.561: Pain in right knee  THERAPY DIAG:  Stiffness of right knee, not elsewhere classified  Other abnormalities of gait and mobility  Muscle weakness (generalized)  Acute pain of right knee  Localized edema  RATIONALE FOR EVALUATION AND TREATMENT: Rehabilitation  ONSET DATE: 09/13/2023  NEXT MD VISIT: 10/04/23   SUBJECTIVE:   SUBJECTIVE STATEMENT: Pt reports she has been able to manage her pain with Tylenol and the muscle relaxants. She does note the occasional "zingers" as the nerve starts to wake up.  Walking full time with the [facility] now w/o issue.  PERTINENT HISTORY: HTN, DM, A-Fib, Melanoma, Neuromuscular disorder, GERD  PAIN:  Are you having pain? Yes: NPRS scale: 1/10  Pain location: R anterior knee Pain description: more sharp than dull, throbbing  Aggravating factors: movement - trying to bend knee  Relieving factors: ice machine, meds  PRECAUTIONS: None  RED FLAGS: None   WEIGHT BEARING RESTRICTIONS: No  FALLS:  Has patient fallen in last 6 months? 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  DIAGNOSTIC FINDINGS:  N/A  PATIENT SURVEYS:  Lower Extremity Functional Score: 12 / 80 = 15.0 %  COGNITION: Overall cognitive status: Within functional limits for tasks assessed     SENSATION: WFL Mild neuropathy in B feet  EDEMA:  Circumferential:     R = 49.5 cm (6 cm above patella), 45.5 cm (at patella) 42.0 cm (6 cm below patella) L = R = 45.0 cm (6 cm above patella), 41.5 cm (at patella) 34.0 cm (6 cm below patella)  PALPATION: TTP  LOWER EXTREMITY ROM:  A/PROM Right eval Left eval R 09/21/23 R 09/28/23  Hip flexion      Hip extension      Hip abduction      Hip adduction      Hip internal rotation      Hip external rotation      Knee flexion 60 / 62 109 72 77  Knee extension -15 / -7 0 -5 -8 / -3  Ankle dorsiflexion      Ankle plantarflexion      Ankle inversion      Ankle eversion       (Blank rows = not tested)  LOWER EXTREMITY MMT:  MMT Right eval Left eval  Hip flexion 3+ 4  Hip extension 4- 4  Hip abduction 4- 4  Hip adduction 3+ p! 4  Hip internal rotation    Hip external rotation    Knee flexion 4- 4  Knee extension 3+ p! 4  Ankle dorsiflexion    Ankle plantarflexion    Ankle inversion    Ankle eversion     (Blank rows = not tested)  LOWER EXTREMITY SPECIAL TESTS:  N/A  FUNCTIONAL TESTS:  Timed up and go (TUG): 29.56 sec with RW   GAIT: Distance walked: clinic distances Assistive device utilized: Environmental consultant - 2 wheeled Level of assistance: Modified independence Gait pattern: step to pattern, decreased step length- Right, decreased stance time- Right, and decreased hip/knee flexion- Right Comments: Performed TUG w/ pt: 29.56s                                                                                                                               TREATMENT DATE:   09/28/2023  THERAPEUTIC EXERCISE: To improve strength, endurance, ROM, and flexibility.  Demonstration, verbal and tactile cues throughout for technique.  NuStep - L5 x 6 min Supine HS curls with  heels on peanut ball + strap assist for R knee  flexion AAROM x 20 Supine R quad + glute set isometric into peanut ball 10 x 5" Supine R quad set + SLR 2 x 10 Gravity assisted R knee flexion ROM with thigh supported over PT's arm x 10, progressing to contract/relax into knee flexion ROM stretch Prone quad stretch with strap 3 x 30" Mod Thomas quad + hip flexor stretch with strap x 30" - patient reporting similar feel to prone quad stretch therefore prone version added to HEP  MANUAL THERAPY: To promote improved flexibility, improved joint mobility, and increased ROM utilizing joint mobilization.  R knee - patellar mobs all directions - emphasis on inferior for increased R knee flexion => instruction provided for self mobilization at home Supine R tibiofemoral distraction and A/P mobs to increase knee flexion ROM   09/26/2023  THERAPEUTIC EXERCISE: To improve strength, endurance, ROM, and flexibility.  Demonstration, verbal and tactile cues throughout for technique. Bike partial revolutions x 6 min Knee flexion stretch lunge on 6' step 10x5" Knee flexion stretch lateral lunge on 6' step 10x5" Retro step x 15 RLE back LLE fwd Church pew x 15 Prone R TKE 10x7"; 2 sets Prone knee flexion PROM Supine heel slides  GAIT TRAINING: To normalize gait pattern and improve safety. With SPC 2x90'- decreased hip/knee flexion,antalgic gait MODALITIES: Game Ready vasopneumatic compression post session to R knee x 10 min, med compression, 34 to reduce post-exercise pain and swelling/edema   09/24/2023  THERAPEUTIC EXERCISE: To improve strength, endurance, ROM, and flexibility.  Demonstration, verbal and tactile cues throughout for technique.  NuStep - L5 x 6 min Supine knee flexion hang towel under knee 2 x 10 Supine heel slides 2x10 AAROM with strap Supine QS towel under knee 10x5" Long sitting gastroc stretch 2x30" Manual PROM and prolonged stretching for knee flexion mostly some stretching for  extension  NEUROMUSCULAR RE-EDUCATION: To improve proprioception, balance, and kinesthesia. Church pews x 20 Retro steps x 20  Standing heel/toe raises 2 x 10   PATIENT EDUCATION:  Education details: HEP update - quad stretch for knee flexion ROM, continue with current HEP, and patellar self mobilization Person educated: Patient Education method: Explanation, Demonstration, Verbal cues, and Handouts Education comprehension: verbalized understanding, returned demonstration, verbal cues required, and needs further education  HOME EXERCISE PROGRAM: Currently performing hospital HEP  Access Code: [email or web address] URL: [email or web address] Date: 09/28/2023 Prepared by: Glenetta Hew  Exercises - Church Pew  - 1 x daily - 7 x weekly - 2-3 sets - 10 reps - Stride Stance Weight Shift  - 1 x daily - 7 x weekly - 2-3 sets - 10 reps - Gastroc Stretch on Wall  - 1 x daily - 7 x weekly - 3 sets - 3 reps - 30 seconds hold - Standing Knee Flexion Stretch on Step  - 1 x daily - 7 x weekly - 2 sets - 10 reps - 5 second hold - Prone Terminal Knee Extension  - 1 x daily - 7 x weekly - 2 sets - 10 reps - 5 secons hold - Prone Hip Flexor & Quad Stretch with Strap  - 1-2 x daily - 7 x weekly - 3 reps - 30 sec hold - Long Sitting 4 Way Patellar Glide  - 2-3 x daily - 7 x weekly - 2 sets - 10 reps   ASSESSMENT:  CLINICAL IMPRESSION: Cornisha reports improvement in the tightness and pain, able to manage pain with just Tylenol and muscle relaxants.  Now walking exclusively with just the [facility]  w/o issues.  R knee ROM progressing slowly with current AROM -8-77 with patient able to achieve -3 extension ROM with LE supported.  Treatment focus on improving R knee flexion ROM with A/AAROM, quad stretching and joint mobilization with significant gains noted by end of session.  Patient instructed in self performance of patellar mobilization at home to facilitate increased R knee ROM.  Session concluded with GR  vasopneumatic compression to reduce post exercise pain and edema.  Carra will benefit from continued skilled PT to address ongoing pain, ROM, strength and balance deficits to improve mobility and activity tolerance with decreased pain interference.   OBJECTIVE IMPAIRMENTS: Abnormal gait, decreased activity tolerance, decreased endurance, decreased knowledge of use of DME, decreased mobility, difficulty walking, decreased ROM, decreased strength, increased edema, impaired perceived functional ability, impaired flexibility, improper body mechanics, postural dysfunction, and pain.   ACTIVITY LIMITATIONS: carrying, lifting, bending, standing, squatting, stairs, transfers, bed mobility, bathing, toileting, dressing, and locomotion level  PARTICIPATION LIMITATIONS: cleaning, laundry, driving, community activity, and occupation  PERSONAL FACTORS: Age, Fitness, and 3+ comorbidities: HTN, DM, A-Fib, Melanoma, Neuromuscular disorder, GERD  are also affecting patient's functional outcome.   REHAB POTENTIAL: Excellent  CLINICAL DECISION MAKING: Evolving/moderate complexity  EVALUATION COMPLEXITY: Moderate   GOALS: Goals reviewed with patient? Yes  SHORT TERM GOALS: Target date: 10/15/2023  Pt will be independent w/ HEP Baseline: Goal status: IN PROGRESS   2.  Pt will have improve R knee ROM to -7 to 90 to normalize gait pattern Baseline: R knee AROM -15 to 60 & PROM -7 to 62 Goal status: IN PROGRESS - 09/28/23 - R knee AROM -8-77, with -3 supported extension  3.  Pt will be able to perform TUG <20 sec w/ AD Baseline: 29.56 sec with RW  Goal status: IN PROGRESS    LONG TERM GOALS: Target date: 11/12/2023  Pt will be able to improve LEFS score to 40/80 to demonstrate functional ability Baseline:  LEFS 12 / 80 = 15.0 % Goal status: IN PROGRESS   2.  Pt will report at least 60% improvement in R knee pain to improve QoL Baseline: 0/10 on eval, on average 5/10 Goal status: IN PROGRESS   3.   Pt will be able to perform decreased TUG time in <=13.5 sec to decrease fall risk Baseline: 29.56 sec with RW  Goal status: IN PROGRESS   4.  Pt will improve to improve overall R LE strength 4+/5 to improve stability and mobility Baseline: refer above to MMT table Goal status: IN PROGRESS   5.  Pt will improve R knee AROM 0 to 105 to normalize gait pattern Baseline: R knee AROM -15 to 60 & PROM -7 to 62 Goal status: IN PROGRESS   6.  Pt will be able go up a flight stairs with alternating gait pattern w/ UE support w/ SPC or single hand rail to increase independence in community access Baseline: Not tested Goal status: IN PROGRESS   7.  Pt will be able to implement step through gait pattern to safely ambulate in community w/ LRAD Baseline: Step to gait pattern w/ flexed posture using RW Goal status: IN PROGRESS   PLAN:  PT FREQUENCY: 3x/week - 3x/wk x 2 weeks, tapering to 2x a week for duration of POC  PT DURATION: 8 weeks  PLANNED INTERVENTIONS: [id number]- PT Re-evaluation, 97110-Therapeutic exercises, 97530- Therapeutic activity, 97112- Neuromuscular re-education, 97535- Self Care, [id number]- Manual therapy, [id number]- Gait training, [id number]- Electrical stimulation (manual), [id number]- Vasopneumatic device, [id number]- Ionotophoresis  4mg /ml Dexamethasone, Patient/Family education, Balance training, Stair training, Taping, Dry Needling, Joint mobilization, Scar mobilization, DME instructions, Cryotherapy, Moist heat, and 16109 - Physical Performance Test or Measurement  PLAN FOR NEXT SESSION: MD PN for appointment on 10/03/2023; progress HEP, improve proximal LE flexibility & R knee ROM, joint mobs, LE strengthening, gait training working toward weaning from AD; at least weekly ROM measurements   Marry Guan, PT 09/28/2023, 1:55 PM

## 2023-10-01 ENCOUNTER — Ambulatory Visit: Payer: BC Managed Care – PPO

## 2023-10-01 DIAGNOSIS — R2689 Other abnormalities of gait and mobility: Secondary | ICD-10-CM

## 2023-10-01 DIAGNOSIS — M6281 Muscle weakness (generalized): Secondary | ICD-10-CM | POA: Diagnosis not present

## 2023-10-01 DIAGNOSIS — M25561 Pain in right knee: Secondary | ICD-10-CM | POA: Diagnosis not present

## 2023-10-01 DIAGNOSIS — M25661 Stiffness of right knee, not elsewhere classified: Secondary | ICD-10-CM | POA: Diagnosis not present

## 2023-10-01 DIAGNOSIS — R600 Localized edema: Secondary | ICD-10-CM

## 2023-10-01 NOTE — Therapy (Signed)
 OUTPATIENT PHYSICAL THERAPY LOWER EXTREMITY TREATMENT  Patient Name: Jean Hopkins MRN: [id number] DOB:01-25-1956, 68 y.o., female Today's Date: 10/01/2023  END OF SESSION:  PT End of Session - 10/01/23 1058     Visit Number 7    Date for PT Re-Evaluation 11/12/23    Authorization Type [employer]    PT Start Time 1016    PT Stop Time 1108    PT Time Calculation (min) 52 min    Activity Tolerance Patient tolerated treatment well    Behavior During Therapy WFL for tasks assessed/performed                   Past Medical History:  Diagnosis Date   AF (atrial fibrillation) (HCC)    hyperthyroid induced   Allergy    Anemia    Arthritis 11/2020   Right knee   Blood transfusion without reported diagnosis    Cancer (HCC)    Melanoma x2   Cataract    Complication of anesthesia    Slow to wake up with general anesthesia   Controlled type 2 diabetes mellitus without complication, without long-term current use of insulin (HCC) 02/23/2022   no meds   GERD (gastroesophageal reflux disease)    Hypercholesterolemia 08/25/2022   Hypertension    Hyperthyroidism    Neuromuscular disorder (HCC)    neurpathy feet   Pneumonia    Past Surgical History:  Procedure Laterality Date   ABDOMINAL HYSTERECTOMY  2007   CHOLECYSTECTOMY  1988   TOTAL ABDOMINAL HYSTERECTOMY     TOTAL KNEE ARTHROPLASTY Right 09/13/2023   Procedure: ARTHROPLASTY, KNEE, TOTAL;  Surgeon: Durene Romans, MD;  Location: [facility];  Service: Orthopedics;  Laterality: Right;   Patient Active Problem List   Diagnosis Date Noted   S/P total knee arthroplasty, right 09/13/2023   Postmenopausal 08/15/2023   Hypercholesterolemia 08/25/2022   Personal history of malignant melanoma of skin 08/25/2022   Vitamin D deficiency 08/25/2022   Controlled type 2 diabetes mellitus without complication, without long-term current use of insulin (HCC) 02/23/2022   Hypertension 02/02/2021   Melanoma of skin (HCC) 02/02/2021   Raynaud's  phenomenon 02/02/2021   Gastroesophageal reflux disease without esophagitis 01/14/2021   Osteoarthritis of right knee 01/04/2021   Hyperthyroidism    Atrial fibrillation (HCC) 07/29/2017    PCP: Christen Butter, NP  REFERRING PROVIDER: Cassandria Anger, PA-C    REFERRING DIAG: ICD-10: M25.561: Pain in right knee  THERAPY DIAG:  Stiffness of right knee, not elsewhere classified  Other abnormalities of gait and mobility  Muscle weakness (generalized)  Acute pain of right knee  Localized edema  RATIONALE FOR EVALUATION AND TREATMENT: Rehabilitation  ONSET DATE: 09/13/2023  NEXT MD VISIT: 10/04/23   SUBJECTIVE:   SUBJECTIVE STATEMENT: Pt went to granddaughters volleyball game over the weekend, she sat for a while but she still did her exercises.  PERTINENT HISTORY: HTN, DM, A-Fib, Melanoma, Neuromuscular disorder, GERD  PAIN:  Are you having pain? Yes: NPRS scale: 2/10  Pain location: R anterior knee Pain description: more sharp than dull, throbbing  Aggravating factors: movement - trying to bend knee  Relieving factors: ice machine, meds  PRECAUTIONS: None  RED FLAGS: None   WEIGHT BEARING RESTRICTIONS: No  FALLS:  Has patient fallen in last 6 months? 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  DIAGNOSTIC FINDINGS: N/A  PATIENT SURVEYS:  Lower Extremity Functional Score: 12 / 80 = 15.0 %  COGNITION: Overall  cognitive status: Within functional limits for tasks assessed     SENSATION: WFL Mild neuropathy in B feet  EDEMA:  Circumferential:     R = 49.5 cm (6 cm above patella), 45.5 cm (at patella) 42.0 cm (6 cm below patella) L = R = 45.0 cm (6 cm above patella), 41.5 cm (at patella) 34.0 cm (6 cm below patella)  PALPATION: TTP  LOWER EXTREMITY ROM:  A/PROM Right eval Left eval R 09/21/23 R 09/28/23 10/01/23  Hip flexion       Hip extension       Hip abduction       Hip adduction       Hip internal rotation       Hip external rotation       Knee flexion 60 / 62 109 72 77 79  Knee extension -15 / -7 0 -5 -8 / -3 4  Ankle dorsiflexion       Ankle plantarflexion       Ankle inversion       Ankle eversion        (Blank rows = not tested)  LOWER EXTREMITY MMT:  MMT Right eval Left eval  Hip flexion 3+ 4  Hip extension 4- 4  Hip abduction 4- 4  Hip adduction 3+ p! 4  Hip internal rotation    Hip external rotation    Knee flexion 4- 4  Knee extension 3+ p! 4  Ankle dorsiflexion    Ankle plantarflexion    Ankle inversion    Ankle eversion     (Blank rows = not tested)  LOWER EXTREMITY SPECIAL TESTS:  N/A  FUNCTIONAL TESTS:  Timed up and go (TUG): 29.56 sec with RW   GAIT: Distance walked: clinic distances Assistive device utilized: Environmental consultant - 2 wheeled Level of assistance: Modified independence Gait pattern: step to pattern, decreased step length- Right, decreased stance time- Right, and decreased hip/knee flexion- Right Comments: Performed TUG w/ pt: 29.56s                                                                                                                               TREATMENT DATE:  10/01/2023  THERAPEUTIC EXERCISE: To improve strength, endurance, ROM, and flexibility.  Demonstration, verbal and tactile cues throughout for technique.  NuStep - L5 x 6 min Supine heel slides foot on peanut ball 2x10 SLR 2lb 2x10 PROM into end range knee flexion with  prolonged holds  NMR: Knee flexion stretch lunge on 6' step 10x5"; 2 sets Standing heel raises 2x10 Standing hip abduction x 10 bil Standing hip extension x 10 bil  MODALITIES: Game Ready vasopneumatic compression post session to R knee x 10 min, med compression, 34 to reduce post-exercise pain and swelling/edema 10/01/2023  THERAPEUTIC EXERCISE: To improve strength, endurance, ROM, and flexibility.  Demonstration, verbal and tactile cues throughout for technique.  NuStep - L5 x 6 min Supine HS curls with heels on peanut ball + strap assist for R knee flexion AAROM x 20 Supine R quad + glute set isometric into peanut ball 10 x 5" Supine R quad set + SLR 2 x 10 Gravity assisted R knee flexion ROM with thigh supported over PT's arm x 10, progressing to contract/relax into knee flexion ROM stretch Prone quad stretch with strap 3 x 30" Mod Thomas quad + hip flexor stretch with strap x 30" - patient reporting similar feel to prone quad stretch therefore prone version added to HEP  MANUAL THERAPY: To promote improved flexibility, improved joint mobility, and increased ROM utilizing joint mobilization.  R knee - patellar mobs all directions - emphasis on inferior for increased R knee flexion => instruction provided for self mobilization at home Supine R tibiofemoral distraction and A/P mobs to increase knee flexion ROM   09/26/2023  THERAPEUTIC EXERCISE: To improve strength, endurance, ROM, and flexibility.  Demonstration, verbal and tactile cues throughout for technique. Bike partial revolutions x 6 min Knee flexion stretch lunge on 6' step 10x5" Knee flexion stretch lateral lunge on 6' step 10x5" Retro step x 15 RLE back LLE fwd Church pew x 15 Prone R TKE 10x7"; 2 sets Prone knee flexion PROM Supine heel slides  GAIT TRAINING: To normalize gait pattern and improve safety. With SPC 2x90'- decreased hip/knee flexion,antalgic gait MODALITIES: Game Ready vasopneumatic compression post  session to R knee x 10 min, med compression, 34 to reduce post-exercise pain and swelling/edema   09/24/2023  THERAPEUTIC EXERCISE: To improve strength, endurance, ROM, and flexibility.  Demonstration, verbal and tactile cues throughout for technique.  NuStep - L5 x 6 min Supine knee flexion hang towel under knee 2 x 10 Supine heel slides 2x10 AAROM with strap Supine QS towel under knee 10x5" Long sitting gastroc stretch 2x30" Manual PROM and prolonged stretching for knee flexion mostly some stretching for extension  NEUROMUSCULAR RE-EDUCATION: To improve proprioception, balance, and kinesthesia. Church pews x 20 Retro steps x 20  Standing heel/toe raises 2 x 10   PATIENT EDUCATION:  Education details: HEP update - quad stretch for knee flexion ROM, continue with current HEP, and patellar self mobilization Person educated: Patient Education method: Explanation, Demonstration, Verbal cues, and Handouts Education comprehension: verbalized understanding, returned demonstration, verbal cues required, and needs further education  HOME EXERCISE PROGRAM: Currently performing hospital HEP  Access Code: [email or web address] URL: [email or web address] Date: 09/28/2023 Prepared by: Glenetta Hew  Exercises - Church Pew  - 1 x daily - 7 x weekly - 2-3 sets - 10 reps - Stride Stance Weight Shift  - 1 x daily - 7 x weekly - 2-3 sets - 10 reps - Gastroc Stretch on Wall  - 1 x daily - 7 x weekly - 3 sets - 3 reps - 30 seconds hold - Standing Knee Flexion Stretch on Step  - 1 x daily - 7 x weekly - 2 sets - 10 reps - 5 second hold - Prone Terminal Knee Extension  - 1 x daily - 7  x weekly - 2 sets - 10 reps - 5 secons hold - Prone Hip Flexor & Quad Stretch with Strap  - 1-2 x daily - 7 x weekly - 3 reps - 30 sec hold - Long Sitting 4 Way Patellar Glide  - 2-3 x daily - 7 x weekly - 2 sets - 10 reps   ASSESSMENT:  CLINICAL IMPRESSION: Eliese is showing slow, gradual improvement in her knee  AROM. She is walking well with a SPC. We are continuing to focus on knee flexion as this has been coming along slower than extension. Worked on standing exercises for proprioception and hip strength, good response to treatment. Rickelle will benefit from continued skilled PT to address ongoing pain, ROM, strength and balance deficits to improve mobility and activity tolerance with decreased pain interference.   OBJECTIVE IMPAIRMENTS: Abnormal gait, decreased activity tolerance, decreased endurance, decreased knowledge of use of DME, decreased mobility, difficulty walking, decreased ROM, decreased strength, increased edema, impaired perceived functional ability, impaired flexibility, improper body mechanics, postural dysfunction, and pain.   ACTIVITY LIMITATIONS: carrying, lifting, bending, standing, squatting, stairs, transfers, bed mobility, bathing, toileting, dressing, and locomotion level  PARTICIPATION LIMITATIONS: cleaning, laundry, driving, community activity, and occupation  PERSONAL FACTORS: Age, Fitness, and 3+ comorbidities: HTN, DM, A-Fib, Melanoma, Neuromuscular disorder, GERD  are also affecting patient's functional outcome.   REHAB POTENTIAL: Excellent  CLINICAL DECISION MAKING: Evolving/moderate complexity  EVALUATION COMPLEXITY: Moderate   GOALS: Goals reviewed with patient? Yes  SHORT TERM GOALS: Target date: 10/15/2023  Pt will be independent w/ HEP Baseline: Goal status: IN PROGRESS   2.  Pt will have improve R knee ROM to -7 to 90 to normalize gait pattern Baseline: R knee AROM -15 to 60 & PROM -7 to 62 Goal status: IN PROGRESS - 09/28/23 - R knee AROM -8-77, with -3 supported extension  3.  Pt will be able to perform TUG <20 sec w/ AD Baseline: 29.56 sec with RW  Goal status: IN PROGRESS    LONG TERM GOALS: Target date: 11/12/2023  Pt will be able to improve LEFS score to 40/80 to demonstrate functional ability Baseline:  LEFS 12 / 80 = 15.0 % Goal status: IN  PROGRESS   2.  Pt will report at least 60% improvement in R knee pain to improve QoL Baseline: 0/10 on eval, on average 5/10 Goal status: IN PROGRESS   3.  Pt will be able to perform decreased TUG time in <=13.5 sec to decrease fall risk Baseline: 29.56 sec with RW  Goal status: IN PROGRESS   4.  Pt will improve to improve overall R LE strength 4+/5 to improve stability and mobility Baseline: refer above to MMT table Goal status: IN PROGRESS   5.  Pt will improve R knee AROM 0 to 105 to normalize gait pattern Baseline: R knee AROM -15 to 60 & PROM -7 to 62 Goal status: IN PROGRESS   6.  Pt will be able go up a flight stairs with alternating gait pattern w/ UE support w/ SPC or single hand rail to increase independence in community access Baseline: Not tested Goal status: IN PROGRESS   7.  Pt will be able to implement step through gait pattern to safely ambulate in community w/ LRAD Baseline: Step to gait pattern w/ flexed posture using RW Goal status: IN PROGRESS   PLAN:  PT FREQUENCY: 3x/week - 3x/wk x 2 weeks, tapering to 2x a week for duration of POC  PT DURATION: 8  weeks  PLANNED INTERVENTIONS: 97164- PT Re-evaluation, 97110-Therapeutic exercises, 97530- Therapeutic activity, 97112- Neuromuscular re-education, 97535- Self Care, 16109- Manual therapy, [phone]- Gait training, [phone]- Electrical stimulation (manual), 97016- Vasopneumatic device, [phone]- Ionotophoresis 4mg /ml Dexamethasone, Patient/Family education, Balance training, Stair training, Taping, Dry Needling, Joint mobilization, Scar mobilization, DME instructions, Cryotherapy, Moist heat, and 97750 - Physical Performance Test or Measurement  PLAN FOR NEXT SESSION: progress HEP, improve proximal LE flexibility & R knee ROM, joint mobs, LE strengthening, gait training working toward weaning from AD; at least weekly ROM measurements   Imad Shostak L Yonna Alwin, PTA 10/01/2023, 10:58 AM

## 2023-10-04 ENCOUNTER — Ambulatory Visit: Payer: BC Managed Care – PPO | Admitting: Physical Therapy

## 2023-10-04 ENCOUNTER — Encounter: Payer: Self-pay | Admitting: Physical Therapy

## 2023-10-04 DIAGNOSIS — R600 Localized edema: Secondary | ICD-10-CM | POA: Diagnosis not present

## 2023-10-04 DIAGNOSIS — M6281 Muscle weakness (generalized): Secondary | ICD-10-CM | POA: Diagnosis not present

## 2023-10-04 DIAGNOSIS — R2689 Other abnormalities of gait and mobility: Secondary | ICD-10-CM | POA: Diagnosis not present

## 2023-10-04 DIAGNOSIS — M25661 Stiffness of right knee, not elsewhere classified: Secondary | ICD-10-CM | POA: Diagnosis not present

## 2023-10-04 DIAGNOSIS — M25561 Pain in right knee: Secondary | ICD-10-CM

## 2023-10-04 NOTE — Therapy (Signed)
 OUTPATIENT PHYSICAL THERAPY LOWER EXTREMITY TREATMENT  Patient Name: Jean Hopkins MRN: [id number] DOB:11-Feb-1956, 68 y.o., female Today's Date: 10/04/2023  END OF SESSION:  PT End of Session - 10/04/23 1017     Visit Number 8    Date for PT Re-Evaluation 11/12/23    Authorization Type [employer]    PT Start Time 1015    PT Stop Time 1110    PT Time Calculation (min) 55 min    Activity Tolerance Patient tolerated treatment well    Behavior During Therapy WFL for tasks assessed/performed                    Past Medical History:  Diagnosis Date   AF (atrial fibrillation) (HCC)    hyperthyroid induced   Allergy    Anemia    Arthritis 11/2020   Right knee   Blood transfusion without reported diagnosis    Cancer (HCC)    Melanoma x2   Cataract    Complication of anesthesia    Slow to wake up with general anesthesia   Controlled type 2 diabetes mellitus without complication, without long-term current use of insulin (HCC) 02/23/2022   no meds   GERD (gastroesophageal reflux disease)    Hypercholesterolemia 08/25/2022   Hypertension    Hyperthyroidism    Neuromuscular disorder (HCC)    neurpathy feet   Pneumonia    Past Surgical History:  Procedure Laterality Date   ABDOMINAL HYSTERECTOMY  2007   CHOLECYSTECTOMY  1988   TOTAL ABDOMINAL HYSTERECTOMY     TOTAL KNEE ARTHROPLASTY Right 09/13/2023   Procedure: ARTHROPLASTY, KNEE, TOTAL;  Surgeon: Durene Romans, MD;  Location: [facility];  Service: Orthopedics;  Laterality: Right;   Patient Active Problem List   Diagnosis Date Noted   S/P total knee arthroplasty, right 09/13/2023   Postmenopausal 08/15/2023   Hypercholesterolemia 08/25/2022   Personal history of malignant melanoma of skin 08/25/2022   Vitamin D deficiency 08/25/2022   Controlled type 2 diabetes mellitus without complication, without long-term current use of insulin (HCC) 02/23/2022   Hypertension 02/02/2021   Melanoma of skin (HCC) 02/02/2021   Raynaud's  phenomenon 02/02/2021   Gastroesophageal reflux disease without esophagitis 01/14/2021   Osteoarthritis of right knee 01/04/2021   Hyperthyroidism    Atrial fibrillation (HCC) 07/29/2017    PCP: Christen Butter, NP  REFERRING PROVIDER: Cassandria Anger, PA-C    REFERRING DIAG: ICD-10: M25.561: Pain in right knee  THERAPY DIAG:  Stiffness of right knee, not elsewhere classified  Other abnormalities of gait and mobility  Muscle weakness (generalized)  Acute pain of right knee  Localized edema  RATIONALE FOR EVALUATION AND TREATMENT: Rehabilitation  ONSET DATE: 09/13/2023  NEXT MD VISIT: 10/24/23   SUBJECTIVE:   SUBJECTIVE STATEMENT: Feeling good, just having some stiffness in R knee. Went to dr yesterday and mention that they want her at 26. She goes back to the Dr in 3 weeks and they want her to be at 110-120. Able to walk short distances around the house without AD.   PERTINENT HISTORY: HTN, DM, A-Fib, Melanoma, Neuromuscular disorder, GERD  PAIN:  Are you having pain? Yes: NPRS scale: 0/10, just stiffness Pain location: R anterior knee Pain description: more sharp than dull, throbbing  Aggravating factors: movement - trying to bend knee  Relieving factors: ice machine, meds  PRECAUTIONS: None  RED FLAGS: None   WEIGHT BEARING RESTRICTIONS: No  FALLS:  Has patient fallen in last 6 months? 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  DIAGNOSTIC FINDINGS: N/A  PATIENT SURVEYS:  Lower Extremity Functional Score: 12 / 80 = 15.0 %  COGNITION: Overall cognitive status: Within functional limits for tasks assessed     SENSATION: WFL Mild neuropathy in B feet  EDEMA:  Circumferential:     R = 49.5 cm (6 cm above patella), 45.5 cm (at patella) 42.0 cm (6 cm below patella) L = R = 45.0 cm (6 cm above patella), 41.5 cm (at patella) 34.0 cm (6 cm below patella)  PALPATION: TTP  LOWER EXTREMITY ROM:  A/PROM Right eval Left eval R 09/21/23 R 09/28/23 10/01/23 10/04/23  Hip flexion        Hip extension        Hip abduction        Hip adduction        Hip internal rotation        Hip external rotation        Knee flexion 60 / 62 109 72 77 79 80 / 82  Knee extension -15 / -7 0 -5 -8 / -3 4   Ankle dorsiflexion        Ankle plantarflexion        Ankle inversion        Ankle eversion         (Blank rows = not tested)  LOWER EXTREMITY MMT:  MMT Right eval Left eval  Hip flexion 3+ 4  Hip extension 4- 4  Hip abduction 4- 4  Hip adduction 3+ p! 4  Hip internal rotation    Hip external rotation    Knee flexion 4- 4  Knee extension 3+ p! 4  Ankle dorsiflexion    Ankle plantarflexion    Ankle inversion    Ankle eversion     (Blank rows = not tested)  LOWER EXTREMITY SPECIAL TESTS:  N/A  FUNCTIONAL TESTS:  Timed up and go (TUG): 29.56 sec with RW   GAIT: Distance walked: clinic distances Assistive device utilized: Environmental consultant - 2 wheeled Level of assistance: Modified independence Gait pattern: step to pattern, decreased step length- Right, decreased stance time- Right, and decreased hip/knee flexion- Right Comments: Performed TUG w/ pt: 29.56s                                                                                                                               TREATMENT DATE:   10/04/2023  THERAPEUTIC EXERCISE: To improve strength, endurance, ROM, and flexibility.  Demonstration,  verbal and tactile cues throughout for technique. NuStep L5 x 7 min Supine heel slides on peanut ball 2 x 10 x 3-5" Supine isometric glutes + HS on peanut ball 2 x 10 x 3" SLR 3lb x 2 x 10  Prone hip extension 2 x 10 Seated LAQ  2 x 10 Standing knee flexion on step x 10 Standing ITB stretch x 30" - described pain in lateral knee when sleeping on side & disrupts her sleep  MANUAL THERAPY: To promote normalized muscle tension, improved flexibility, improved joint mobility, and increased ROM utilizing joint mobilization. Hooklying tibiofemoral AP/PA glides Grade III Patellofemoral mobilization  MODALITIES: Game Ready vasopneumatic compression post session to R knee x 10 min, high compression, 34 to reduce post-exercise pain and swelling/edema  10/01/2023  THERAPEUTIC EXERCISE: To improve strength, endurance, ROM, and flexibility.  Demonstration, verbal and tactile cues throughout for technique.  NuStep - L5 x 6 min Supine heel slides foot on peanut ball 2x10 SLR 2lb 2x10 PROM into end range knee flexion with prolonged holds NMR: Knee flexion stretch lunge on 6' step 10x5"; 2 sets Standing heel raises 2x10 Standing hip abduction x 10 bil Standing hip extension x 10 bil  MODALITIES: Game Ready vasopneumatic compression post session to R knee x 10 min, med compression, 34 to reduce post-exercise pain and swelling/edema  09/28/2023  THERAPEUTIC EXERCISE: To improve strength, endurance, ROM, and flexibility.  Demonstration, verbal and tactile cues throughout for technique.  NuStep - L5 x 6 min Supine HS curls with heels on peanut ball + strap assist for R knee flexion AAROM x 20 Supine R quad + glute set isometric into peanut ball 10 x 5" Supine R quad set + SLR 2 x 10 Gravity assisted R knee flexion ROM with thigh supported over PT's arm x 10, progressing to contract/relax into knee flexion ROM stretch Prone quad stretch with strap 3 x 30" Mod Thomas quad + hip flexor stretch with  strap x 30" - patient reporting similar feel to prone quad stretch therefore prone version added to HEP  MANUAL THERAPY: To promote improved flexibility, improved joint mobility, and increased ROM utilizing joint mobilization.  R knee - patellar mobs all directions - emphasis on inferior for increased R knee flexion => instruction provided for self mobilization at home Supine R tibiofemoral distraction and A/P mobs to increase knee flexion ROM  PATIENT EDUCATION:  Education details: HEP update - quad stretch for knee flexion ROM and continue with current HEP Person educated: Patient Education method: Explanation, Demonstration, Verbal cues, and Handouts Education comprehension: verbalized understanding, returned demonstration, verbal cues required, and needs further education  HOME EXERCISE PROGRAM:  Access Code: [email or web address] URL: [email or web address] Date: 10/04/2023 Prepared by: Brynden Thune Joseph-Greene  Exercises - [employer] [place]  - 1 x daily - 7 x weekly - 2-3 sets - 10 reps - Stride Stance Weight Shift  - 1 x daily - 7 x weekly - 2-3 sets - 10 reps - Gastroc Stretch on Wall  - 1 x daily - 7 x weekly - 3 sets - 3 reps - 30 seconds hold - Standing Knee Flexion Stretch on Step  - 1 x daily - 7 x weekly - 2 sets - 10 reps - 5 second hold - Prone Terminal Knee Extension  - 1 x daily - 7 x weekly - 2 sets - 10 reps - 5 secons hold - Prone Hip Flexor & Quad Stretch  with Strap  - 1-2 x daily - 7 x weekly - 3 reps - 30 sec hold - Long Sitting 4 Way Patellar Glide  - 2-3 x daily - 7 x weekly - 2 sets - 10 reps - Standing ITB Stretch  - 1 x daily - 5-7 x weekly - 2 sets - 30 hold   ASSESSMENT:  CLINICAL IMPRESSION: Jean Hopkins reported that she went to the doctor yesterday and the doctor expected her to be at 90 knee flexion. She will go back to doctor in 3 weeks and want her to be between 110-120. She mentioned that she doesn't want the doctor to perform a knee manipulation to increase ROM.  Also, stated that she was having some stiffness in R knee. Since she mentioned stiffness, wanted to perform joint mobilizations to help increase knee flexion. Before joint mobs, she was 80 and afterwards, she improved to 82. The main focus of today's session was to increase knee flexion ROM which she tolerated well. She feels like she's able to bend her knee a little more during the session. Jean Hopkins will benefit from continued skilled PT to address ongoing stiffness, ROM, and strength deficits to improve mobility and activity intolerance.   OBJECTIVE IMPAIRMENTS: Abnormal gait, decreased activity tolerance, decreased endurance, decreased knowledge of use of DME, decreased mobility, difficulty walking, decreased ROM, decreased strength, increased edema, impaired perceived functional ability, impaired flexibility, improper body mechanics, postural dysfunction, and pain.   ACTIVITY LIMITATIONS: carrying, lifting, bending, standing, squatting, stairs, transfers, bed mobility, bathing, toileting, dressing, and locomotion level  PARTICIPATION LIMITATIONS: cleaning, laundry, driving, community activity, and occupation  PERSONAL FACTORS: Age, Fitness, and 3+ comorbidities: HTN, DM, A-Fib, Melanoma, Neuromuscular disorder, GERD  are also affecting patient's functional outcome.   REHAB POTENTIAL: Excellent  CLINICAL DECISION MAKING: Evolving/moderate complexity  EVALUATION COMPLEXITY: Moderate   GOALS: Goals reviewed with patient? Yes  SHORT TERM GOALS: Target date: 10/15/2023  Pt will be independent w/ HEP Baseline: Goal status: IN PROGRESS   2.  Pt will have improve R knee ROM to -7 to 90 to normalize gait pattern Baseline: R knee AROM -15 to 60 & PROM -7 to 62 Goal status: IN PROGRESS - 09/28/23 - R knee AROM -8-77, with -3 supported extension; 10/04/23 - R knee ROM 82  3.  Pt will be able to perform TUG <20 sec w/ AD Baseline: 29.56 sec with RW  Goal status: IN PROGRESS    LONG TERM  GOALS: Target date: 11/12/2023  Pt will be able to improve LEFS score to 40/80 to demonstrate functional ability Baseline:  LEFS 12 / 80 = 15.0 % Goal status: IN PROGRESS   2.  Pt will report at least 60% improvement in R knee pain to improve QoL Baseline: 0/10 on eval, on average 5/10 Goal status: IN PROGRESS   3.  Pt will be able to perform decreased TUG time in <=13.5 sec to decrease fall risk Baseline: 29.56 sec with RW  Goal status: IN PROGRESS   4.  Pt will improve to improve overall R LE strength 4+/5 to improve stability and mobility Baseline: refer above to MMT table Goal status: IN PROGRESS   5.  Pt will improve R knee AROM 0 to 105 to normalize gait pattern Baseline: R knee AROM -15 to 60 & PROM -7 to 62 Goal status: IN PROGRESS   6.  Pt will be able go up a flight stairs with alternating gait pattern w/ UE support w/ SPC or single hand  rail to increase independence in community access Baseline: Not tested Goal status: IN PROGRESS   7.  Pt will be able to implement step through gait pattern to safely ambulate in community w/ LRAD Baseline: Step to gait pattern w/ flexed posture using RW Goal status: IN PROGRESS   PLAN:  PT FREQUENCY: 3x/week - 3x/wk x 2 weeks, tapering to 2x a week for duration of POC  PT DURATION: 8 weeks  PLANNED INTERVENTIONS: 97164- PT Re-evaluation, 97110-Therapeutic exercises, 97530- Therapeutic activity, 97112- Neuromuscular re-education, 97535- Self Care, 16109- Manual therapy, [id number]- Gait training, [phone]- Electrical stimulation (manual), 97016- Vasopneumatic device, 97033- Ionotophoresis 4mg /ml Dexamethasone, Patient/Family education, Balance training, Stair training, Taping, Dry Needling, Joint mobilization, Scar mobilization, DME instructions, Cryotherapy, Moist heat, and 97750 - Physical Performance Test or Measurement  PLAN FOR NEXT SESSION: Assess STG #2 & #3: perform TUG on next visit; progress HEP, improve proximal LE flexibility & R  knee ROM, joint mobs, LE strengthening, gait training working toward weaning from AD; at least weekly ROM measurements   Joeleen Wortley Joseph-Greene, Student-PT 10/04/2023, 11:18 AM

## 2023-10-08 ENCOUNTER — Ambulatory Visit: Payer: BC Managed Care – PPO | Admitting: Internal Medicine

## 2023-10-08 ENCOUNTER — Encounter: Payer: Self-pay | Admitting: Physical Therapy

## 2023-10-08 ENCOUNTER — Ambulatory Visit: Payer: BC Managed Care – PPO | Admitting: Physical Therapy

## 2023-10-08 DIAGNOSIS — M25661 Stiffness of right knee, not elsewhere classified: Secondary | ICD-10-CM | POA: Diagnosis not present

## 2023-10-08 DIAGNOSIS — M6281 Muscle weakness (generalized): Secondary | ICD-10-CM

## 2023-10-08 DIAGNOSIS — R600 Localized edema: Secondary | ICD-10-CM | POA: Diagnosis not present

## 2023-10-08 DIAGNOSIS — R2689 Other abnormalities of gait and mobility: Secondary | ICD-10-CM

## 2023-10-08 DIAGNOSIS — M25561 Pain in right knee: Secondary | ICD-10-CM

## 2023-10-08 NOTE — Therapy (Signed)
 OUTPATIENT PHYSICAL THERAPY LOWER EXTREMITY TREATMENT  Patient Name: JAZMEEN AXTELL MRN: [id number] DOB:Apr 08, 1956, 68 y.o., female Today's Date: 10/08/2023  END OF SESSION:  PT End of Session - 10/08/23 1018     Visit Number 9    Date for PT Re-Evaluation 11/12/23    Authorization Type BC[employer]    PT Start Time 1018    PT Stop Time 1112    PT Time Calculation (min) 54 min    Activity Tolerance Patient tolerated treatment well    Behavior During Therapy WFL for tasks assessed/performed                     Past Medical History:  Diagnosis Date   AF (atrial fibrillation) (HCC)    hyperthyroid induced   Allergy    Anemia    Arthritis 11/2020   Right knee   Blood transfusion without reported diagnosis    Cancer (HCC)    Melanoma x2   Cataract    Complication of anesthesia    Slow to wake up with general anesthesia   Controlled type 2 diabetes mellitus without complication, without long-term current use of insulin (HCC) 02/23/2022   no meds   GERD (gastroesophageal reflux disease)    Hypercholesterolemia 08/25/2022   Hypertension    Hyperthyroidism    Neuromuscular disorder (HCC)    neurpathy feet   Pneumonia    Past Surgical History:  Procedure Laterality Date   ABDOMINAL HYSTERECTOMY  2007   CHOLECYSTECTOMY  1988   TOTAL ABDOMINAL HYSTERECTOMY     TOTAL KNEE ARTHROPLASTY Right 09/13/2023   Procedure: ARTHROPLASTY, KNEE, TOTAL;  Surgeon: Claiborne Crew, MD;  Location: [facility];  Service: Orthopedics;  Laterality: Right;   Patient Active Problem List   Diagnosis Date Noted   S/P total knee arthroplasty, right 09/13/2023   Postmenopausal 08/15/2023   Hypercholesterolemia 08/25/2022   Personal history of malignant melanoma of skin 08/25/2022   Vitamin D deficiency 08/25/2022   Controlled type 2 diabetes mellitus without complication, without long-term current use of insulin (HCC) 02/23/2022   Hypertension 02/02/2021   Melanoma of skin (HCC) 02/02/2021    Raynaud's phenomenon 02/02/2021   Gastroesophageal reflux disease without esophagitis 01/14/2021   Osteoarthritis of right knee 01/04/2021   Hyperthyroidism    Atrial fibrillation (HCC) 07/29/2017    PCP: Cherre Cornish, NP  REFERRING PROVIDER: Earnie Gola, PA-C    REFERRING DIAG: ICD-10: M25.561: Pain in right knee  THERAPY DIAG:  Stiffness of right knee, not elsewhere classified  Other abnormalities of gait and mobility  Muscle weakness (generalized)  Acute pain of right knee  Localized edema  RATIONALE FOR EVALUATION AND TREATMENT: Rehabilitation  ONSET DATE: 09/13/2023  NEXT MD VISIT: 10/24/23   SUBJECTIVE:   SUBJECTIVE STATEMENT: Pt reports she has been able to walk around her home w/o the [facility] but still uses it when going out or walking around the block.  She has been working her knee flexion in hopes of avoiding MUA.  Still cannot get comfortable sleeping on her side as her knee starts to ache.  PERTINENT HISTORY: HTN, DM, A-Fib, Melanoma, Neuromuscular disorder, GERD  PAIN:  Are you having pain? Yes: NPRS scale: 0/10, just stiffness Pain location: R anterior knee Pain description: more sharp than dull, throbbing  Aggravating factors: movement - trying to bend knee  Relieving factors: ice machine, meds  PRECAUTIONS: None  RED FLAGS: None   WEIGHT BEARING RESTRICTIONS: No  FALLS:  Has patient fallen in last  6 months? 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  DIAGNOSTIC FINDINGS: N/A  PATIENT SURVEYS:  Lower Extremity Functional Score: 12 / 80 = 15.0 %  COGNITION: Overall cognitive status: Within functional limits for tasks assessed     SENSATION: WFL Mild neuropathy in B feet  EDEMA:  Circumferential:     R = 49.5 cm (6 cm above patella), 45.5 cm (at patella) 42.0 cm (6 cm below patella) L = R = 45.0 cm (6 cm above patella), 41.5 cm (at patella) 34.0 cm (6 cm below patella)  PALPATION: TTP  LOWER EXTREMITY ROM:  A/PROM Right eval Left eval R 09/21/23 R 09/28/23 10/01/23 10/04/23 10/08/23  Hip flexion         Hip extension         Hip abduction         Hip adduction         Hip internal rotation         Hip external rotation         Knee flexion 60 / 62 109 72 77 79 80 / 82 96  Knee extension -15 / -7 0 -5 -8 / -3 4  -1  Ankle dorsiflexion         Ankle plantarflexion         Ankle inversion         Ankle eversion          (Blank rows = not tested)  LOWER EXTREMITY MMT:  MMT Right eval Left eval  Hip flexion 3+ 4  Hip extension 4- 4  Hip abduction 4- 4  Hip adduction 3+ p! 4  Hip internal rotation    Hip external rotation    Knee flexion 4- 4  Knee extension 3+ p! 4  Ankle dorsiflexion    Ankle plantarflexion    Ankle inversion    Ankle eversion     (Blank rows = not tested)  LOWER EXTREMITY SPECIAL TESTS:  N/A  FUNCTIONAL TESTS:  Timed up and go (TUG): 29.56 sec with RW   GAIT: Distance walked: clinic distances Assistive device utilized: Environmental consultant - 2 wheeled Level of assistance: Modified independence Gait pattern: step to pattern, decreased step length- Right, decreased stance time- Right, and decreased hip/knee flexion- Right Comments: Performed TUG w/ pt: 29.56s  TREATMENT DATE:   10/08/2023 THERAPEUTIC EXERCISE: To  improve strength, endurance, ROM, and flexibility.  Demonstration, verbal and tactile cues throughout for technique. NuStep L5 x 7 min R negative heel gastroc stretch with toes on 2" block x 30" R gastroc runner's stretch at wall x 30" Seated longitudinal distraction with overpressure into R knee flexion PROM Seated heel slide with self-assisted AAROM using L LE into R knee flexion stretch Long-sitting longitudinal distraction by PT + active quad set 10 x 5"   GAIT TRAINING: To normalize gait pattern and prepare to wean from AD. 270 ft w/o AD - good heel strike and heel-toe progression but initial knee stiffness creating asymmetrical step, improving with increased distance  MANUAL THERAPY: To promote improved flexibility, improved joint mobility, and increased ROM utilizing joint mobilization.  Seated longitudinal distraction + tibiofemoral A/P mobs for increased R knee flexion MWM into R knee flexion R patella inferior glide for increased flexion ROM  NEUROMUSCULAR RE-EDUCATION: To improve coordination and kinesthesia. Seated contract/relax into R knee flexion with PT providing longitudinal distraction  MODALITIES: Game Ready vasopneumatic compression post session to R knee x 10 min, high compression, 34 to reduce post-exercise pain and swelling/edema   10/04/2023 THERAPEUTIC EXERCISE: To improve strength, endurance, ROM, and flexibility.  Demonstration, verbal and tactile cues throughout for technique. NuStep L5 x 7 min Supine heel slides on peanut ball 2 x 10 x 3-5" Supine isometric glutes + HS on peanut ball 2 x 10 x 3" SLR 3lb x 2 x 10  Prone hip extension 2 x 10 Seated LAQ  2 x 10 Standing knee flexion on step x 10 Standing ITB stretch x 30" - described pain in lateral knee when sleeping on side & disrupts her sleep  MANUAL THERAPY: To promote normalized muscle tension, improved flexibility, improved joint mobility, and increased ROM utilizing joint mobilization. Hooklying  tibiofemoral AP/PA glides Grade III Patellofemoral mobilization  MODALITIES: Game Ready vasopneumatic compression post session to R knee x 10 min, high compression, 34 to reduce post-exercise pain and swelling/edema   10/01/2023  THERAPEUTIC EXERCISE: To improve strength, endurance, ROM, and flexibility.  Demonstration, verbal and tactile cues throughout for technique.  NuStep - L5 x 6 min Supine heel slides foot on peanut ball 2x10 SLR 2lb 2x10 PROM into end range knee flexion with prolonged holds NMR: Knee flexion stretch lunge on 6' step 10x5"; 2 sets Standing heel raises 2x10 Standing hip abduction x 10 bil Standing hip extension x 10 bil  MODALITIES: Game Ready vasopneumatic compression post session to R knee x 10 min, med compression, 34 to reduce post-exercise pain and swelling/edema   PATIENT EDUCATION:  Education details: HEP update - quad stretch for knee flexion ROM and continue with current HEP Person educated: Patient Education method: Explanation, Demonstration, Verbal cues, and Handouts Education comprehension: verbalized understanding, returned demonstration, verbal cues required, and needs further education  HOME EXERCISE PROGRAM:  Access Code: [email or web address] URL: [email or web address] Date: 10/04/2023 Prepared by: Talia Joseph-Greene  Exercises - [place]  - 1 x daily - 7 x weekly - 2-3 sets - 10 reps - Stride Stance Weight Shift  - 1 x daily - 7 x weekly - 2-3 sets - 10 reps - Gastroc Stretch on Wall  - 1 x daily - 7 x weekly - 3 sets - 3 reps - 30 seconds hold - Standing Knee Flexion Stretch on Step  - 1 x daily - 7 x weekly - 2 sets - 10 reps - 5 second  hold - Prone Terminal Knee Extension  - 1 x daily - 7 x weekly - 2 sets - 10 reps - 5 secons hold - Prone Hip Flexor & Quad Stretch with Strap  - 1-2 x daily - 7 x weekly - 3 reps - 30 sec hold - Long Sitting 4 Way Patellar Glide  - 2-3 x daily - 7 x weekly - 2 sets - 10 reps - Standing ITB  Stretch  - 1 x daily - 5-7 x weekly - 2 sets - 30 hold   ASSESSMENT:  CLINICAL IMPRESSION: Jodine reports she has been working on her flexion ROM over the weekend but initial measurements only demonstrating 84 flexion.  Emphasized R knee flexion ROM with joint mobilizations as well as PROM and AAROM with pt able to achieve 96 flexion by end of session.  R knee extension also improving with -1 achieved by end of session.  Annasophia reports walking more in home w/o AD, therefore assessed gait w/o AD - no significant gait deviations other the some initial stiffness in R knee which seemed to resolve the longer she walked, therefore confirmed that she is okay to ambulate w/o an AD per her comfort level but suggested she continue to use the cane for long distances or unfamiliar places.  Cariana will benefit from continued skilled PT to address ongoing stiffness, ROM, and strength deficits to improve mobility and activity intolerance.   OBJECTIVE IMPAIRMENTS: Abnormal gait, decreased activity tolerance, decreased endurance, decreased knowledge of use of DME, decreased mobility, difficulty walking, decreased ROM, decreased strength, increased edema, impaired perceived functional ability, impaired flexibility, improper body mechanics, postural dysfunction, and pain.   ACTIVITY LIMITATIONS: carrying, lifting, bending, standing, squatting, stairs, transfers, bed mobility, bathing, toileting, dressing, and locomotion level  PARTICIPATION LIMITATIONS: cleaning, laundry, driving, community activity, and occupation  PERSONAL FACTORS: Age, Fitness, and 3+ comorbidities: HTN, DM, A-Fib, Melanoma, Neuromuscular disorder, GERD  are also affecting patient's functional outcome.   REHAB POTENTIAL: Excellent  CLINICAL DECISION MAKING: Evolving/moderate complexity  EVALUATION COMPLEXITY: Moderate   GOALS: Goals reviewed with patient? Yes  SHORT TERM GOALS: Target date: 10/15/2023  Pt will be independent w/  HEP Baseline: Goal status: MET - 10/08/23   2.  Pt will have improve R knee ROM to -7 to 90 to normalize gait pattern Baseline: R knee AROM -15 to 60 & PROM -7 to 62 Goal status: MET - 10/08/23 - R knee AROM -1-96  3.  Pt will be able to perform TUG <20 sec w/ AD Baseline: 29.56 sec with RW  Goal status: MET - 10/08/23 - 10.18 sec w/o AD   LONG TERM GOALS: Target date: 11/12/2023  Pt will be able to improve LEFS score to 40/80 to demonstrate functional ability Baseline:  LEFS 12 / 80 = 15.0 % Goal status: IN PROGRESS   2.  Pt will report at least 60% improvement in R knee pain to improve QoL Baseline: 0/10 on eval, on average 5/10 Goal status: IN PROGRESS   3.  Pt will be able to perform decreased TUG time in <=13.5 sec to decrease fall risk Baseline: 29.56 sec with RW  Goal status: IN PROGRESS   4.  Pt will improve to improve overall R LE strength 4+/5 to improve stability and mobility Baseline: refer above to MMT table Goal status: IN PROGRESS   5.  Pt will improve R knee AROM 0 to 105 to normalize gait pattern Baseline: R knee AROM -15 to 60 & PROM -7  to 62 Goal status: IN PROGRESS   6.  Pt will be able go up a flight stairs with alternating gait pattern w/ UE support w/ SPC or single hand rail to increase independence in community access Baseline: Not tested Goal status: IN PROGRESS   7.  Pt will be able to implement step through gait pattern to safely ambulate in community w/ LRAD Baseline: Step to gait pattern w/ flexed posture using RW Goal status: IN PROGRESS   PLAN:  PT FREQUENCY: 3x/week - 3x/wk x 2 weeks, tapering to 2x a week for duration of POC  PT DURATION: 8 weeks  PLANNED INTERVENTIONS: 97164- PT Re-evaluation, 97110-Therapeutic exercises, 97530- Therapeutic activity, 97112- Neuromuscular re-education, 97535- Self Care, [id number]- Manual therapy, [id number]- Gait training, [id number]- Electrical stimulation (manual), 97016- Vasopneumatic device, 97033- Ionotophoresis  4mg /ml Dexamethasone, Patient/Family education, Balance training, Stair training, Taping, Dry Needling, Joint mobilization, Scar mobilization, DME instructions, Cryotherapy, Moist heat, and 97750 - Physical Performance Test or Measurement  PLAN FOR NEXT SESSION:   improve proximal LE flexibility & R knee ROM, joint mobs, LE strengthening, progress HEP as indicated, gait training working toward weaning from AD; at least weekly ROM measurements   Francisco Irving, PT 10/08/2023, 12:42 PM

## 2023-10-11 ENCOUNTER — Ambulatory Visit: Payer: BC Managed Care – PPO

## 2023-10-11 DIAGNOSIS — R2689 Other abnormalities of gait and mobility: Secondary | ICD-10-CM

## 2023-10-11 DIAGNOSIS — R600 Localized edema: Secondary | ICD-10-CM | POA: Diagnosis not present

## 2023-10-11 DIAGNOSIS — M25661 Stiffness of right knee, not elsewhere classified: Secondary | ICD-10-CM

## 2023-10-11 DIAGNOSIS — M25561 Pain in right knee: Secondary | ICD-10-CM

## 2023-10-11 DIAGNOSIS — M6281 Muscle weakness (generalized): Secondary | ICD-10-CM

## 2023-10-11 NOTE — Therapy (Signed)
 OUTPATIENT PHYSICAL THERAPY LOWER EXTREMITY TREATMENT  Patient Name: Jean Hopkins MRN: [id number] DOB:10/18/1955, 68 y.o., female Today's Date: 10/11/2023  END OF SESSION:  PT End of Session - 10/11/23 1103     Visit Number 10    Date for PT Re-Evaluation 11/12/23    Authorization Type BCBS    PT Start Time 1019    PT Stop Time 1110    PT Time Calculation (min) 51 min    Activity Tolerance Patient tolerated treatment well    Behavior During Therapy WFL for tasks assessed/performed                      Past Medical History:  Diagnosis Date   AF (atrial fibrillation) (HCC)    hyperthyroid induced   Allergy    Anemia    Arthritis 11/2020   Right knee   Blood transfusion without reported diagnosis    Cancer (HCC)    Melanoma x2   Cataract    Complication of anesthesia    Slow to wake up with general anesthesia   Controlled type 2 diabetes mellitus without complication, without long-term current use of insulin (HCC) 02/23/2022   no meds   GERD (gastroesophageal reflux disease)    Hypercholesterolemia 08/25/2022   Hypertension    Hyperthyroidism    Neuromuscular disorder (HCC)    neurpathy feet   Pneumonia    Past Surgical History:  Procedure Laterality Date   ABDOMINAL HYSTERECTOMY  2007   CHOLECYSTECTOMY  1988   TOTAL ABDOMINAL HYSTERECTOMY     TOTAL KNEE ARTHROPLASTY Right 09/13/2023   Procedure: ARTHROPLASTY, KNEE, TOTAL;  Surgeon: Claiborne Crew, MD;  Location: [facility];  Service: Orthopedics;  Laterality: Right;   Patient Active Problem List   Diagnosis Date Noted   S/P total knee arthroplasty, right 09/13/2023   Postmenopausal 08/15/2023   Hypercholesterolemia 08/25/2022   Personal history of malignant melanoma of skin 08/25/2022   Vitamin D deficiency 08/25/2022   Controlled type 2 diabetes mellitus without complication, without long-term current use of insulin (HCC) 02/23/2022   Hypertension 02/02/2021   Melanoma of skin (HCC) 02/02/2021    Raynaud's phenomenon 02/02/2021   Gastroesophageal reflux disease without esophagitis 01/14/2021   Osteoarthritis of right knee 01/04/2021   Hyperthyroidism    Atrial fibrillation (HCC) 07/29/2017    PCP: Cherre Cornish, NP  REFERRING PROVIDER: Earnie Gola, PA-C    REFERRING DIAG: ICD-10: M25.561: Pain in right knee  THERAPY DIAG:  Stiffness of right knee, not elsewhere classified  Other abnormalities of gait and mobility  Muscle weakness (generalized)  Acute pain of right knee  Localized edema  RATIONALE FOR EVALUATION AND TREATMENT: Rehabilitation  ONSET DATE: 09/13/2023  NEXT MD VISIT: 10/24/23   SUBJECTIVE:   SUBJECTIVE STATEMENT: Pt reports she made improvement in her bend last time, her goal is 110 by 4/30.  PERTINENT HISTORY: HTN, DM, A-Fib, Melanoma, Neuromuscular disorder, GERD  PAIN:  Are you having pain? Yes: NPRS scale: 0/10, just stiffness Pain location: R anterior knee Pain description: more sharp than dull, throbbing  Aggravating factors: movement - trying to bend knee  Relieving factors: ice machine, meds  PRECAUTIONS: None  RED FLAGS: None   WEIGHT BEARING RESTRICTIONS: No  FALLS:  Has patient fallen in last 6 months? 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  DIAGNOSTIC FINDINGS: N/A  PATIENT SURVEYS:  Lower Extremity Functional Score: 12 / 80 = 15.0 %  COGNITION: Overall  cognitive status: Within functional limits for tasks assessed     SENSATION: WFL Mild neuropathy in B feet  EDEMA:  Circumferential:     R = 49.5 cm (6 cm above patella), 45.5 cm (at patella) 42.0 cm (6 cm below patella) L = R = 45.0 cm (6 cm above patella), 41.5 cm (at patella) 34.0 cm (6 cm below patella)  PALPATION: TTP  LOWER EXTREMITY ROM:  A/PROM Right eval Left eval R 09/21/23 R 09/28/23 10/01/23 10/04/23 10/08/23  Hip flexion         Hip extension         Hip abduction         Hip adduction         Hip internal rotation         Hip external rotation         Knee flexion 60 / 62 109 72 77 79 80 / 82 96  Knee extension -15 / -7 0 -5 -8 / -3 4  -1  Ankle dorsiflexion         Ankle plantarflexion         Ankle inversion         Ankle eversion          (Blank rows = not tested)  LOWER EXTREMITY MMT:  MMT Right eval Left eval  Hip flexion 3+ 4  Hip extension 4- 4  Hip abduction 4- 4  Hip adduction 3+ p! 4  Hip internal rotation    Hip external rotation    Knee flexion 4- 4  Knee extension 3+ p! 4  Ankle dorsiflexion    Ankle plantarflexion    Ankle inversion    Ankle eversion     (Blank rows = not tested)  LOWER EXTREMITY SPECIAL TESTS:  N/A  FUNCTIONAL TESTS:  Timed up and go (TUG): 29.56 sec with RW   GAIT: Distance walked: clinic distances Assistive device utilized: Environmental consultant - 2 wheeled Level of assistance: Modified independence Gait pattern: step to pattern, decreased step length- Right, decreased stance time- Right, and decreased hip/knee flexion- Right Comments: Performed TUG w/ pt: 29.56s                                                                                                                               TREATMENT DATE:  10/11/2023 THERAPEUTIC EXERCISE: To improve strength, endurance, ROM, and flexibility.  Demonstration, verbal and tactile cues throughout for technique. NuStep  L5 x 7 min Knee flexion PROM to end range Seated HS curl RTB 2 x  10 LAQ 2lb 2 x 10  Manual Therapy: to decrease muscle spasm, pain and improve mobility.  R tibiofemoral AP mobs for knee extension grade III-IV Scar tissue mobilization Patellar mobs grade III-IV  Therapeutic Activity: to improve functional performance. Step ups 4' x 10 RLE Lateral step ups 4' x 10 RLE- cues for slow/controlled motion Squats x 10   MODALITIES: Game Ready vasopneumatic compression post session to R knee x 10 min, high compression, 34 to reduce post-exercise pain and swelling/edema  10/08/2023 THERAPEUTIC EXERCISE: To improve strength, endurance, ROM, and flexibility.  Demonstration, verbal and tactile cues throughout for technique. NuStep L5 x 7 min R negative heel gastroc stretch with toes on 2" block x 30" R gastroc runner's stretch at wall x 30" Seated longitudinal distraction with overpressure into R knee flexion PROM Seated heel slide with self-assisted AAROM using L LE into R knee flexion stretch Long-sitting longitudinal distraction by PT + active quad set 10 x 5"   GAIT TRAINING: To normalize gait pattern and prepare to wean from AD. 270 ft w/o AD - good heel strike and heel-toe progression but initial knee stiffness creating asymmetrical step, improving with increased distance  MANUAL THERAPY: To promote improved flexibility, improved joint mobility, and increased ROM utilizing joint mobilization.  Seated longitudinal distraction + tibiofemoral A/P mobs for increased R knee flexion MWM into R knee flexion R patella inferior glide for increased flexion ROM  NEUROMUSCULAR RE-EDUCATION: To improve coordination and kinesthesia. Seated contract/relax into R knee flexion with PT providing longitudinal distraction  MODALITIES: Game Ready vasopneumatic compression post session to R knee x 10 min, high compression, 34 to reduce post-exercise pain and swelling/edema   10/04/2023 THERAPEUTIC EXERCISE: To improve strength, endurance, ROM, and flexibility.   Demonstration, verbal and tactile cues throughout for technique. NuStep L5 x 7 min Supine heel slides on peanut ball 2 x 10 x 3-5" Supine isometric glutes + HS on peanut ball 2 x 10 x 3" SLR 3lb x 2 x 10  Prone hip extension 2 x 10 Seated LAQ  2 x 10 Standing knee flexion on step x 10 Standing ITB stretch x 30" - described pain in lateral knee when sleeping on side & disrupts her sleep  MANUAL THERAPY: To promote normalized muscle tension, improved flexibility, improved joint mobility, and increased ROM utilizing joint mobilization. Hooklying tibiofemoral AP/PA glides Grade III Patellofemoral mobilization  MODALITIES: Game Ready vasopneumatic compression post session to R knee x 10 min, high compression, 34 to reduce post-exercise pain and swelling/edema   10/01/2023  THERAPEUTIC EXERCISE: To improve strength, endurance, ROM, and flexibility.  Demonstration, verbal and tactile cues throughout for technique.  NuStep - L5 x 6 min Supine heel slides foot on peanut ball 2x10 SLR 2lb 2x10 PROM into end range knee flexion with prolonged holds NMR: Knee flexion stretch lunge on 6' step 10x5"; 2 sets Standing heel raises 2x10 Standing hip abduction x 10 bil Standing hip extension x 10 bil  MODALITIES: Game Ready vasopneumatic compression post session to R knee x 10 min, med compression, 34 to reduce post-exercise pain and swelling/edema   PATIENT EDUCATION:  Education details: HEP update - quad stretch for knee flexion ROM and continue with current HEP Person educated: Patient Education method: Explanation, Demonstration, Verbal cues, and Handouts Education comprehension: verbalized understanding, returned demonstration, verbal cues required, and needs further education  HOME EXERCISE PROGRAM:  Access Code:  [email or web address] URL: [email or web address] Date: 10/04/2023 Prepared by: Talia Joseph-Greene  Exercises - [place]  - 1 x daily - 7 x weekly - 2-3 sets - 10 reps -  Stride Stance Weight Shift  - 1 x daily - 7 x weekly - 2-3 sets - 10 reps - Gastroc Stretch on Wall  - 1 x daily - 7 x weekly - 3 sets - 3 reps - 30 seconds hold - Standing Knee Flexion Stretch on Step  - 1 x daily - 7 x weekly - 2 sets - 10 reps - 5 second hold - Prone Terminal Knee Extension  - 1 x daily - 7 x weekly - 2 sets - 10 reps - 5 secons hold - Prone Hip Flexor & Quad Stretch with Strap  - 1-2 x daily - 7 x weekly - 3 reps - 30 sec hold - Long Sitting 4 Way Patellar Glide  - 2-3 x daily - 7 x weekly - 2 sets - 10 reps - Standing ITB Stretch  - 1 x daily - 5-7 x weekly - 2 sets - 30 hold   ASSESSMENT:  CLINICAL IMPRESSION: Advanced work on knee flexion ROM, through MT and PROM stretching. Progressed strengthening to improve functional mobility and strength. Instruction given on scar tissue mobilizations to perform at home d/t restrictions found. Trust will benefit from continued skilled PT to address ongoing stiffness, ROM, and strength deficits to improve mobility and activity intolerance.   OBJECTIVE IMPAIRMENTS: Abnormal gait, decreased activity tolerance, decreased endurance, decreased knowledge of use of DME, decreased mobility, difficulty walking, decreased ROM, decreased strength, increased edema, impaired perceived functional ability, impaired flexibility, improper body mechanics, postural dysfunction, and pain.   ACTIVITY LIMITATIONS: carrying, lifting, bending, standing, squatting, stairs, transfers, bed mobility, bathing, toileting, dressing, and locomotion level  PARTICIPATION LIMITATIONS: cleaning, laundry, driving, community activity, and occupation  PERSONAL FACTORS: Age, Fitness, and 3+ comorbidities: HTN, DM, A-Fib, Melanoma, Neuromuscular disorder, GERD  are also affecting patient's functional outcome.   REHAB POTENTIAL: Excellent  CLINICAL DECISION MAKING: Evolving/moderate complexity  EVALUATION COMPLEXITY: Moderate   GOALS: Goals reviewed with patient?  Yes  SHORT TERM GOALS: Target date: 10/15/2023  Pt will be independent w/ HEP Baseline: Goal status: MET - 10/08/23   2.  Pt will have improve R knee ROM to -7 to 90 to normalize gait pattern Baseline: R knee AROM -15 to 60 & PROM -7 to 62 Goal status: MET - 10/08/23 - R knee AROM -1-96  3.  Pt will be able to perform TUG <20 sec w/ AD Baseline: 29.56 sec with RW  Goal status: MET - 10/08/23 - 10.18 sec w/o AD   LONG TERM GOALS: Target date: 11/12/2023  Pt will be able to improve LEFS score to 40/80 to demonstrate functional ability Baseline:  LEFS 12 / 80 = 15.0 % Goal status: IN PROGRESS   2.  Pt will report at least 60% improvement in R knee pain to improve QoL Baseline: 0/10 on eval, on average 5/10 Goal status: IN PROGRESS   3.  Pt will be able to perform decreased TUG time in <=13.5 sec to decrease fall risk Baseline: 29.56 sec with RW  Goal status: IN PROGRESS   4.  Pt will improve to improve overall R LE strength 4+/5 to improve stability and mobility Baseline: refer above to MMT table Goal status: IN PROGRESS   5.  Pt will improve R knee AROM 0 to 105 to normalize gait pattern Baseline: R knee AROM -  15 to 60 & PROM -7 to 62 Goal status: IN PROGRESS   6.  Pt will be able go up a flight stairs with alternating gait pattern w/ UE support w/ SPC or single hand rail to increase independence in community access Baseline: Not tested Goal status: IN PROGRESS   7.  Pt will be able to implement step through gait pattern to safely ambulate in community w/ LRAD Baseline: Step to gait pattern w/ flexed posture using RW Goal status: IN PROGRESS   PLAN:  PT FREQUENCY: 3x/week - 3x/wk x 2 weeks, tapering to 2x a week for duration of POC  PT DURATION: 8 weeks  PLANNED INTERVENTIONS: 97164- PT Re-evaluation, 97110-Therapeutic exercises, 97530- Therapeutic activity, 97112- Neuromuscular re-education, 97535- Self Care, [address]- Manual therapy, [id number]- Gait training, [id number]  Electrical stimulation (manual), 97016- Vasopneumatic device, 97033- Ionotophoresis 4mg /ml Dexamethasone, Patient/Family education, Balance training, Stair training, Taping, Dry Needling, Joint mobilization, Scar mobilization, DME instructions, Cryotherapy, Moist heat, and 97750 - Physical Performance Test or Measurement  PLAN FOR NEXT SESSION:   improve proximal LE flexibility & R knee ROM, joint mobs, LE strengthening, progress HEP as indicated, gait training working toward weaning from AD; at least weekly ROM measurements   Londin Antone L Zedekiah Hinderman, PTA 10/11/2023, 11:04 AM

## 2023-10-15 ENCOUNTER — Encounter: Payer: Self-pay | Admitting: Internal Medicine

## 2023-10-15 ENCOUNTER — Ambulatory Visit (INDEPENDENT_AMBULATORY_CARE_PROVIDER_SITE_OTHER): Payer: BC Managed Care – PPO | Admitting: Internal Medicine

## 2023-10-15 ENCOUNTER — Ambulatory Visit

## 2023-10-15 VITALS — BP 138/72 | HR 91 | Resp 16 | Ht 64.0 in | Wt 230.0 lb

## 2023-10-15 DIAGNOSIS — M25661 Stiffness of right knee, not elsewhere classified: Secondary | ICD-10-CM | POA: Diagnosis not present

## 2023-10-15 DIAGNOSIS — E0590 Thyrotoxicosis, unspecified without thyrotoxic crisis or storm: Secondary | ICD-10-CM

## 2023-10-15 DIAGNOSIS — M6281 Muscle weakness (generalized): Secondary | ICD-10-CM | POA: Diagnosis not present

## 2023-10-15 DIAGNOSIS — R2689 Other abnormalities of gait and mobility: Secondary | ICD-10-CM | POA: Diagnosis not present

## 2023-10-15 DIAGNOSIS — M25561 Pain in right knee: Secondary | ICD-10-CM | POA: Diagnosis not present

## 2023-10-15 DIAGNOSIS — R600 Localized edema: Secondary | ICD-10-CM | POA: Diagnosis not present

## 2023-10-15 LAB — TSH: TSH: 1.13 m[IU]/L (ref 0.40–4.50)

## 2023-10-15 LAB — T4, FREE: Free T4: 1.3 ng/dL (ref 0.8–1.8)

## 2023-10-15 NOTE — Progress Notes (Signed)
 Name: Jean Hopkins  MRN/ DOB: [id number], June 27, 1955    Age/ Sex: 68 y.o., female     PCP: Cherre Cornish, NP   Reason for Endocrinology Evaluation: Hyperthyroidism     Initial Endocrinology Clinic Visit: 08/15/2017    PATIENT IDENTIFIER: Jean Hopkins is a 68 y.o., female with a past medical history of Hyperthyroidism, MNG and A.Fib . She has followed with [facility] since 08/15/2017 for consultative assistance with management of her Hyperthyroidism.   HISTORICAL SUMMARY: The patient was first diagnosed with hyperthyroidism in 2019 with suppressed TSH <0.006 uIU/mL and elevated free T4 at 2.68 NG/DL  She was started on methimazole  until May 2019 due to the development of hyperthyroid, methimazole  was resumed in 2020 with a TSH of 0.03 uIU/mL   Thyroid  ultrasound 12/2018 revealed multinodular goiter with none of the nodules meeting FNA, no continued surveillance was recommended  She declines  RAI ablation    Mother with Hypothyroidism   SUBJECTIVE:    Today (10/15/2023):  Jean Hopkins is here for for follow-up on hyperthyroidism.   Since her last visit here she had  right knee arthroplasty 09/13/2023, undergoing PT  Weight stable  Denies local neck swelling  Denies constipation or diarrhea  Denies palpitations  Denies tremors  No Biotin    Methimazole  5 mg every other day   HISTORY:  Past Medical History:  Past Medical History:  Diagnosis Date   AF (atrial fibrillation) (HCC)    hyperthyroid induced   Allergy    Anemia    Arthritis 11/2020   Right knee   Blood transfusion without reported diagnosis    Cancer (HCC)    Melanoma x2   Cataract    Complication of anesthesia    Slow to wake up with general anesthesia   Controlled type 2 diabetes mellitus without complication, without long-term current use of insulin (HCC) 02/23/2022   no meds   GERD (gastroesophageal reflux disease)    Hypercholesterolemia 08/25/2022   Hypertension    Hyperthyroidism     Neuromuscular disorder (HCC)    neurpathy feet   Pneumonia    Past Surgical History:  Past Surgical History:  Procedure Laterality Date   ABDOMINAL HYSTERECTOMY  2007   CHOLECYSTECTOMY  1988   TOTAL ABDOMINAL HYSTERECTOMY     TOTAL KNEE ARTHROPLASTY Right 09/13/2023   Procedure: ARTHROPLASTY, KNEE, TOTAL;  Surgeon: Claiborne Crew, MD;  Location: [facility];  Service: Orthopedics;  Laterality: Right;   Social History:  reports that she quit smoking about 43 years ago. Her smoking use included cigarettes. She started smoking about 44 years ago. She has a 0.5 pack-year smoking history. She has never used smokeless tobacco. She reports current alcohol use. She reports that she does not use drugs. Family History:  Family History  Problem Relation Age of Onset   Hypertension Mother    Hyperlipidemia Mother    Multiple sclerosis Mother    Osteoporosis Mother    Hyperlipidemia Father    Hypertension Father    Heart disease Father    Hypertension Brother    Hypertension Brother    Hypertension Brother    Diverticulitis Brother    Stomach cancer Paternal Grandmother    Breast cancer Paternal Aunt    Polycystic ovary syndrome Child    Heart disease Brother    Hypertension Brother    Hypertension Brother    Thyroid  disease Neg Hx    Colon cancer Neg Hx    Esophageal cancer Neg Hx  Rectal cancer Neg Hx      HOME MEDICATIONS: Allergies as of 10/15/2023   No Known Allergies      Medication List        Accurate as of October 15, 2023  8:21 AM. If you have any questions, ask your nurse or doctor.          STOP taking these medications    oxycodone  5 MG capsule Commonly known as: OXY-IR Stopped by: Camilla Cedar Jomo Forand   senna 8.6 MG Tabs tablet Commonly known as: SENOKOT Stopped by: Aragorn Recker J Cambren Helm       TAKE these medications    acetaminophen  500 MG tablet Commonly known as: TYLENOL  Take 1,000 mg by mouth every 6 (six) hours as needed for moderate pain (pain  score 4-6).   celecoxib  200 MG capsule Commonly known as: CELEBREX  Take 200 mg by mouth 2 (two) times daily.   hydrochlorothiazide  12.5 MG tablet Commonly known as: HYDRODIURIL  Take 1 tablet (12.5 mg total) by mouth daily.   methimazole  5 MG tablet Commonly known as: TAPAZOLE  Take 1 tablet (5 mg total) by mouth as directed. Every other day   methocarbamol  500 MG tablet Commonly known as: ROBAXIN  Take 500 mg by mouth every 6 (six) hours as needed for muscle spasms.   omeprazole 20 MG capsule Commonly known as: PRILOSEC Take 20 mg by mouth daily.   VITAMIN D  PO Take 2,000 Units by mouth daily.          OBJECTIVE:   PHYSICAL EXAM: VS: BP 138/72   Pulse 91   Resp 16   Ht 5\' 4"  (1.626 m)   Wt 230 lb (104.3 kg)   SpO2 98%   BMI 39.48 kg/m    EXAM: General: Pt appears well and is in NAD  Neck: General: Supple without adenopathy. Thyroid : Thyroid  size normal.  No goiter or nodules appreciated.   Lungs: Clear with good BS bilat  Heart: Auscultation: RRR.  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 [place] [address] Phone: [phone]  Fax: [phone]   Return to Endocrinology clinic as below: Future Appointments  Date Time Provider Department Center  10/15/2023  3:30 PM Samuella Crocker, [place] [facility]  10/17/2023  3:30 PM Samuella Crocker, PTA [facility]  10/23/2023  8:00 AM Francisco Irving, PT [facility]  10/25/2023  8:00 AM Samuella Crocker, PTA [facility]  10/29/2023  8:00 AM Samuella Crocker, PTA [facility]  11/01/2023  8:00 AM Gae Jointer,  Joanne M, PT [facility]  11/05/2023  8:00 AM Clark, Braylin L, PTA [facility]  11/08/2023  8:00 AM Francisco Irving, PT [facility]

## 2023-10-15 NOTE — Therapy (Signed)
 OUTPATIENT PHYSICAL THERAPY LOWER EXTREMITY TREATMENT  Patient Name: Jean Hopkins MRN: [id number] DOB:1956/05/04, 68 y.o., female Today's Date: 10/15/2023  END OF SESSION:  PT End of Session - 10/15/23 1618     Visit Number 11    Date for PT Re-Evaluation 11/12/23    Authorization Type [employer]    PT Start Time 1530    PT Stop Time 1625    PT Time Calculation (min) 55 min    Activity Tolerance Patient tolerated treatment well    Behavior During Therapy WFL for tasks assessed/performed                       Past Medical History:  Diagnosis Date   AF (atrial fibrillation) (HCC)    hyperthyroid induced   Allergy    Anemia    Arthritis 11/2020   Right knee   Blood transfusion without reported diagnosis    Cancer (HCC)    Melanoma x2   Cataract    Complication of anesthesia    Slow to wake up with general anesthesia   Controlled type 2 diabetes mellitus without complication, without long-term current use of insulin (HCC) 02/23/2022   no meds   GERD (gastroesophageal reflux disease)    Hypercholesterolemia 08/25/2022   Hypertension    Hyperthyroidism    Neuromuscular disorder (HCC)    neurpathy feet   Pneumonia    Past Surgical History:  Procedure Laterality Date   ABDOMINAL HYSTERECTOMY  2007   CHOLECYSTECTOMY  1988   TOTAL ABDOMINAL HYSTERECTOMY     TOTAL KNEE ARTHROPLASTY Right 09/13/2023   Procedure: ARTHROPLASTY, KNEE, TOTAL;  Surgeon: Claiborne Crew, MD;  Location: [facility];  Service: Orthopedics;  Laterality: Right;   Patient Active Problem List   Diagnosis Date Noted   S/P total knee arthroplasty, right 09/13/2023   Postmenopausal 08/15/2023   Hypercholesterolemia 08/25/2022   Personal history of malignant melanoma of skin 08/25/2022   Vitamin D  deficiency 08/25/2022   Controlled type 2 diabetes mellitus without complication, without long-term current use of insulin (HCC) 02/23/2022   Hypertension 02/02/2021   Melanoma of skin (HCC) 02/02/2021    Raynaud's phenomenon 02/02/2021   Gastroesophageal reflux disease without esophagitis 01/14/2021   Osteoarthritis of right knee 01/04/2021   Hyperthyroidism    Atrial fibrillation (HCC) 07/29/2017    PCP: Cherre Cornish, NP  REFERRING PROVIDER: Earnie Gola, PA-C    REFERRING DIAG: ICD-10: M25.561: Pain in right knee  THERAPY DIAG:  Stiffness of right knee, not elsewhere classified  Other abnormalities of gait and mobility  Muscle weakness (generalized)  Acute pain of right knee  Localized edema  RATIONALE FOR EVALUATION AND TREATMENT: Rehabilitation  ONSET DATE: 09/13/2023  NEXT MD VISIT: 10/24/23   SUBJECTIVE:   SUBJECTIVE STATEMENT: " The knee feels tight, pain is minimal."  PERTINENT HISTORY: HTN, DM, A-Fib, Melanoma, Neuromuscular disorder, GERD  PAIN:  Are you having pain? Yes: NPRS scale: 1/10, just stiffness Pain location: R anterior knee Pain description: more sharp than dull, throbbing  Aggravating factors: movement - trying to bend knee  Relieving factors: ice machine, meds  PRECAUTIONS: None  RED FLAGS: None   WEIGHT BEARING RESTRICTIONS: No  FALLS:  Has patient fallen in last 6 months? 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  DIAGNOSTIC FINDINGS: N/A  PATIENT SURVEYS:  Lower Extremity Functional Score: 12 / 80 = 15.0 %  COGNITION: Overall cognitive status: Within functional limits for  tasks assessed     SENSATION: WFL Mild neuropathy in B feet  EDEMA:  Circumferential:     R = 49.5 cm (6 cm above patella), 45.5 cm (at patella) 42.0 cm (6 cm below patella) L = R = 45.0 cm (6 cm above patella), 41.5 cm (at patella) 34.0 cm (6 cm below patella)  PALPATION: TTP  LOWER EXTREMITY ROM:  A/PROM Right eval Left eval R 09/21/23 R 09/28/23 10/01/23 10/04/23 10/08/23 10/15/23  Hip flexion          Hip extension          Hip abduction          Hip adduction          Hip internal rotation          Hip external rotation          Knee flexion 60 / 62 109 72 77 79 80 / 82 96 99  Knee extension -15 / -7 0 -5 -8 / -3 4  -1 2  Ankle dorsiflexion          Ankle plantarflexion          Ankle inversion          Ankle eversion           (Blank rows = not tested)  LOWER EXTREMITY MMT:  MMT Right eval Left eval  Hip flexion 3+ 4  Hip extension 4- 4  Hip abduction 4- 4  Hip adduction 3+ p! 4  Hip internal rotation    Hip external rotation    Knee flexion 4- 4  Knee extension 3+ p! 4  Ankle dorsiflexion    Ankle plantarflexion    Ankle inversion    Ankle eversion     (Blank rows = not tested)  LOWER EXTREMITY SPECIAL TESTS:  N/A  FUNCTIONAL TESTS:  Timed up and go (TUG): 29.56 sec with RW   GAIT: Distance walked: clinic distances Assistive device utilized: Environmental consultant - 2 wheeled Level of assistance: Modified independence Gait pattern: step to pattern, decreased step length- Right, decreased stance time- Right, and decreased hip/knee flexion- Right Comments: Performed TUG w/ pt: 29.56s                                                                                                                               TREATMENT DATE:  10/15/2023 THERAPEUTIC EXERCISE: To improve strength, endurance, ROM, and flexibility.  Demonstration, verbal and  tactile cues throughout for technique. Bike partial rev x 7 min Supine heel slide 2x10 AAROM with peanut ball  Manual Therapy: to  decrease muscle spasm, pain and improve mobility. Scar tissues mobilization Knee joint mobilization for flexion and extension Edema massage for R knee and thigh Therapeutic Activity: to improve functional performance. Step ups 4' 2 x 10 RLE Lateral step ups 4' 2 x 10 RLE- cues for slow/controlled motion Squats 2 x 10   10/11/2023 THERAPEUTIC EXERCISE: To improve strength, endurance, ROM, and flexibility.  Demonstration, verbal and tactile cues throughout for technique. NuStep L5 x 7 min Knee flexion PROM to end range Seated HS curl RTB 2 x 10 LAQ 2lb 2 x 10  Manual Therapy: to decrease muscle spasm, pain and improve mobility.  R tibiofemoral AP mobs for knee extension grade III-IV Scar tissue mobilization Patellar mobs grade III-IV  Therapeutic Activity: to improve functional performance. Step ups 4' x 10 RLE Lateral step ups 4' x 10 RLE- cues for slow/controlled motion Squats x 10   MODALITIES: Game Ready vasopneumatic compression post session to R knee x 10 min, high compression, 34 to reduce post-exercise pain and swelling/edema  10/08/2023 THERAPEUTIC EXERCISE: To improve strength, endurance, ROM, and flexibility.  Demonstration, verbal and tactile cues throughout for technique. NuStep L5 x 7 min R negative heel gastroc stretch with toes on 2" block x 30" R gastroc runner's stretch at wall x 30" Seated longitudinal distraction with overpressure into R knee flexion PROM Seated heel slide with self-assisted AAROM using L LE into R knee flexion stretch Long-sitting longitudinal distraction by PT + active quad set 10 x 5"   GAIT TRAINING: To normalize gait pattern and prepare to wean from AD. 270 ft w/o AD - good heel strike and heel-toe progression but initial knee stiffness creating asymmetrical step, improving with increased distance  MANUAL THERAPY: To promote improved flexibility, improved joint mobility, and increased ROM utilizing joint mobilization.  Seated  longitudinal distraction + tibiofemoral A/P mobs for increased R knee flexion MWM into R knee flexion R patella inferior glide for increased flexion ROM  NEUROMUSCULAR RE-EDUCATION: To improve coordination and kinesthesia. Seated contract/relax into R knee flexion with PT providing longitudinal distraction  MODALITIES: Game Ready vasopneumatic compression post session to R knee x 10 min, high compression, 34 to reduce post-exercise pain and swelling/edema   10/04/2023 THERAPEUTIC EXERCISE: To improve strength, endurance, ROM, and flexibility.  Demonstration, verbal and tactile cues throughout for technique. NuStep L5 x 7 min Supine heel slides on peanut ball 2 x 10 x 3-5" Supine isometric glutes + HS on peanut ball 2 x 10 x 3" SLR 3lb x 2 x 10  Prone hip extension 2 x 10 Seated LAQ  2 x 10 Standing knee flexion on step x 10 Standing ITB stretch x 30" - described pain in lateral knee when sleeping on side & disrupts her sleep  MANUAL THERAPY: To promote normalized muscle tension, improved flexibility, improved joint mobility, and increased ROM utilizing joint mobilization. Hooklying tibiofemoral AP/PA glides Grade III Patellofemoral mobilization  MODALITIES: Game Ready vasopneumatic compression post session to R knee x 10 min, high compression, 34 to reduce post-exercise pain and swelling/edema   10/01/2023  THERAPEUTIC EXERCISE: To improve strength, endurance, ROM, and flexibility.  Demonstration, verbal and tactile cues throughout for technique.  NuStep - L5 x 6 min Supine heel slides foot on peanut ball 2x10 SLR 2lb 2x10 PROM into end range knee flexion with prolonged holds NMR: Knee flexion stretch  lunge on 6' step 10x5"; 2 sets Standing heel raises 2x10 Standing hip abduction x 10 bil Standing hip extension x 10 bil  MODALITIES: Game Ready vasopneumatic compression post session to R knee x 10 min, med compression, 34 to reduce post-exercise pain and  swelling/edema   PATIENT EDUCATION:  Education details: HEP update - quad stretch for knee flexion ROM and continue with current HEP Person educated: Patient Education method: Explanation, Demonstration, Verbal cues, and Handouts Education comprehension: verbalized understanding, returned demonstration, verbal cues required, and needs further education  HOME EXERCISE PROGRAM:  Access Code: [email or web address] URL: [email or web address] Date: 10/04/2023 Prepared by: Talia Joseph-Greene  Exercises - [place]  - 1 x daily - 7 x weekly - 2-3 sets - 10 reps - Stride Stance Weight Shift  - 1 x daily - 7 x weekly - 2-3 sets - 10 reps - Gastroc Stretch on Wall  - 1 x daily - 7 x weekly - 3 sets - 3 reps - 30 seconds hold - Standing Knee Flexion Stretch on Step  - 1 x daily - 7 x weekly - 2 sets - 10 reps - 5 second hold - Prone Terminal Knee Extension  - 1 x daily - 7 x weekly - 2 sets - 10 reps - 5 secons hold - Prone Hip Flexor & Quad Stretch with Strap  - 1-2 x daily - 7 x weekly - 3 reps - 30 sec hold - Long Sitting 4 Way Patellar Glide  - 2-3 x daily - 7 x weekly - 2 sets - 10 reps - Standing ITB Stretch  - 1 x daily - 5-7 x weekly - 2 sets - 30 hold   ASSESSMENT:  CLINICAL IMPRESSION: Pt is showing improvement in AROM of her R knee. She continues to be limited with scar mobility and has a good amount of LE  swelling as well. She is participating in more functional activities like step ups. Amere will benefit from continued skilled PT to address ongoing stiffness, ROM, and strength deficits to improve mobility and activity intolerance.   OBJECTIVE IMPAIRMENTS: Abnormal gait, decreased activity tolerance, decreased endurance, decreased knowledge of use of DME, decreased mobility, difficulty walking, decreased ROM, decreased strength, increased edema, impaired perceived functional ability, impaired flexibility, improper body mechanics, postural dysfunction, and pain.   ACTIVITY  LIMITATIONS: carrying, lifting, bending, standing, squatting, stairs, transfers, bed mobility, bathing, toileting, dressing, and locomotion level  PARTICIPATION LIMITATIONS: cleaning, laundry, driving, community activity, and occupation  PERSONAL FACTORS: Age, Fitness, and 3+ comorbidities: HTN, DM, A-Fib, Melanoma, Neuromuscular disorder, GERD  are also affecting patient's functional outcome.   REHAB POTENTIAL: Excellent  CLINICAL DECISION MAKING: Evolving/moderate complexity  EVALUATION COMPLEXITY: Moderate   GOALS: Goals reviewed with patient? Yes  SHORT TERM GOALS: Target date: 10/15/2023  Pt will be independent w/ HEP Baseline: Goal status: MET - 10/08/23   2.  Pt will have improve R knee ROM to -7 to 90 to normalize gait pattern Baseline: R knee AROM -15 to 60 & PROM -7 to 62 Goal status: MET - 10/08/23 - R knee AROM -1-96  3.  Pt will be able to perform TUG <20 sec w/ AD Baseline: 29.56 sec with RW  Goal status: MET - 10/08/23 - 10.18 sec w/o AD   LONG TERM GOALS: Target date: 11/12/2023  Pt will be able to improve LEFS score to 40/80 to demonstrate functional ability Baseline:  LEFS 12 / 80 = 15.0 % Goal status: IN PROGRESS   2.  Pt will report at least 60% improvement in R knee pain to improve QoL Baseline: 0/10 on eval, on average 5/10 Goal status: IN PROGRESS   3.  Pt will be able to perform decreased TUG time in <=13.5 sec to decrease fall risk Baseline: 29.56 sec with RW  Goal status: IN PROGRESS   4.  Pt will improve to improve overall R LE strength 4+/5 to improve stability and mobility Baseline: refer above to MMT table Goal status: IN PROGRESS   5.  Pt will improve R knee AROM 0 to 105 to normalize gait pattern Baseline: R knee AROM -15 to 60 & PROM -7 to 62 Goal status: IN PROGRESS   6.  Pt will be able go up a flight stairs with alternating gait pattern w/ UE support w/ SPC or single hand rail to increase independence in community access Baseline:  Not tested Goal status: IN PROGRESS   7.  Pt will be able to implement step through gait pattern to safely ambulate in community w/ LRAD Baseline: Step to gait pattern w/ flexed posture using RW Goal status: IN PROGRESS   PLAN:  PT FREQUENCY: 3x/week - 3x/wk x 2 weeks, tapering to 2x a week for duration of POC  PT DURATION: 8 weeks  PLANNED INTERVENTIONS: 97164- PT Re-evaluation, 97110-Therapeutic exercises, 97530- Therapeutic activity, 97112- Neuromuscular re-education, 97535- Self Care, 16109- Manual therapy, [id number]- Gait training, [phone]- Electrical stimulation (manual), 97016- Vasopneumatic device, 97033- Ionotophoresis 4mg /ml Dexamethasone , Patient/Family education, Balance training, Stair training, Taping, Dry Needling, Joint mobilization, Scar mobilization, DME instructions, Cryotherapy, Moist heat, and 97750 - Physical Performance Test or Measurement  PLAN FOR NEXT SESSION:   improve proximal LE flexibility & R knee ROM, joint mobs, LE strengthening, progress HEP as indicated, gait training working toward weaning from AD; at least weekly ROM measurements   Salvatore Shear L Zakyah Yanes, PTA 10/15/2023, 5:10 PM

## 2023-10-16 ENCOUNTER — Encounter: Payer: Self-pay | Admitting: Internal Medicine

## 2023-10-16 MED ORDER — METHIMAZOLE 5 MG PO TABS
5.0000 mg | ORAL_TABLET | ORAL | 3 refills | Status: AC
Start: 2023-10-16 — End: ?

## 2023-10-17 ENCOUNTER — Ambulatory Visit

## 2023-10-17 DIAGNOSIS — R600 Localized edema: Secondary | ICD-10-CM

## 2023-10-17 DIAGNOSIS — M25561 Pain in right knee: Secondary | ICD-10-CM | POA: Diagnosis not present

## 2023-10-17 DIAGNOSIS — R2689 Other abnormalities of gait and mobility: Secondary | ICD-10-CM | POA: Diagnosis not present

## 2023-10-17 DIAGNOSIS — M6281 Muscle weakness (generalized): Secondary | ICD-10-CM | POA: Diagnosis not present

## 2023-10-17 DIAGNOSIS — M25661 Stiffness of right knee, not elsewhere classified: Secondary | ICD-10-CM | POA: Diagnosis not present

## 2023-10-17 NOTE — Therapy (Signed)
 OUTPATIENT PHYSICAL THERAPY LOWER EXTREMITY TREATMENT  Patient Name: Jean Hopkins MRN: [id number] DOB:05/15/1956, 68 y.o., female Today's Date: 10/17/2023  END OF SESSION:              Past Medical History:  Diagnosis Date   AF (atrial fibrillation) (HCC)    hyperthyroid induced   Allergy    Anemia    Arthritis 11/2020   Right knee   Blood transfusion without reported diagnosis    Cancer (HCC)    Melanoma x2   Cataract    Complication of anesthesia    Slow to wake up with general anesthesia   Controlled type 2 diabetes mellitus without complication, without long-term current use of insulin (HCC) 02/23/2022   no meds   GERD (gastroesophageal reflux disease)    Hypercholesterolemia 08/25/2022   Hypertension    Hyperthyroidism    Neuromuscular disorder (HCC)    neurpathy feet   Pneumonia    Past Surgical History:  Procedure Laterality Date   ABDOMINAL HYSTERECTOMY  2007   CHOLECYSTECTOMY  1988   TOTAL ABDOMINAL HYSTERECTOMY     TOTAL KNEE ARTHROPLASTY Right 09/13/2023   Procedure: ARTHROPLASTY, KNEE, TOTAL;  Surgeon: Claiborne Crew, MD;  Location: [facility];  Service: Orthopedics;  Laterality: Right;   Patient Active Problem List   Diagnosis Date Noted   S/P total knee arthroplasty, right 09/13/2023   Postmenopausal 08/15/2023   Hypercholesterolemia 08/25/2022   Personal history of malignant melanoma of skin 08/25/2022   Vitamin D  deficiency 08/25/2022   Controlled type 2 diabetes mellitus without complication, without long-term current use of insulin (HCC) 02/23/2022   Hypertension 02/02/2021   Melanoma of skin (HCC) 02/02/2021   Raynaud's phenomenon 02/02/2021   Gastroesophageal reflux disease without esophagitis 01/14/2021   Osteoarthritis of right knee 01/04/2021   Hyperthyroidism    Atrial fibrillation (HCC) 07/29/2017    PCP: Cherre Cornish, NP  REFERRING PROVIDER: Earnie Gola, PA-C    REFERRING DIAG: ICD-10: M25.561: Pain in right  knee  THERAPY DIAG:  Stiffness of right knee, not elsewhere classified  Other abnormalities of gait and mobility  Muscle weakness (generalized)  Acute pain of right knee  Localized edema  RATIONALE FOR EVALUATION AND TREATMENT: Rehabilitation  ONSET DATE: 09/13/2023  NEXT MD VISIT: 10/24/23   SUBJECTIVE:   SUBJECTIVE STATEMENT: Pt reports it was her first day back to work, she tried to walk every 30 min but now her knee feels more swollen and stiff.   PERTINENT HISTORY: HTN, DM, A-Fib, Melanoma, Neuromuscular disorder, GERD  PAIN:  Are you having pain? Yes: NPRS scale: 1/10, just stiffness Pain location: R anterior knee Pain description: more sharp than dull, throbbing  Aggravating factors: movement - trying to bend knee  Relieving factors: ice machine, meds  PRECAUTIONS: None  RED FLAGS: None   WEIGHT BEARING RESTRICTIONS: No  FALLS:  Has patient fallen in last 6 months? 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  DIAGNOSTIC FINDINGS: N/A  PATIENT SURVEYS:  Lower Extremity Functional Score: 12 / 80 = 15.0 %  COGNITION: Overall cognitive status: Within functional limits for tasks assessed     SENSATION: WFL Mild neuropathy in B feet  EDEMA:  Circumferential:     R = 49.5 cm (6 cm above patella), 45.5 cm (at patella) 42.0 cm (6 cm below patella) L = R = 45.0 cm (6 cm above patella), 41.5 cm (at patella)  34.0 cm (6 cm below patella)  PALPATION: TTP  LOWER EXTREMITY ROM:  A/PROM Right eval Left eval R 09/21/23 R 09/28/23 10/01/23 10/04/23 10/08/23 10/15/23  Hip flexion          Hip extension          Hip abduction          Hip adduction          Hip internal rotation          Hip external rotation          Knee flexion 60 / 62 109 72 77 79 80 / 82 96 99  Knee extension -15 / -7 0 -5 -8 / -3 4  -1 2  Ankle dorsiflexion          Ankle plantarflexion          Ankle inversion          Ankle eversion           (Blank rows = not tested)  LOWER EXTREMITY MMT:  MMT Right eval Left eval  Hip flexion 3+ 4  Hip extension 4- 4  Hip abduction 4- 4  Hip adduction 3+ p! 4  Hip internal rotation    Hip external rotation    Knee flexion 4- 4  Knee extension 3+ p! 4  Ankle dorsiflexion    Ankle plantarflexion    Ankle inversion    Ankle eversion     (Blank rows = not tested)  LOWER EXTREMITY SPECIAL TESTS:  N/A  FUNCTIONAL TESTS:  Timed up and go (TUG): 29.56 sec with RW   GAIT: Distance walked: clinic distances Assistive device utilized: Environmental consultant - 2 wheeled Level of assistance: Modified independence Gait pattern: step to pattern, decreased step length- Right, decreased stance time- Right, and decreased hip/knee flexion- Right Comments: Performed TUG w/ pt: 29.56s                                                                                                                               TREATMENT DATE:  10/17/2023 THERAPEUTIC EXERCISE: To improve strength, endurance, ROM, and flexibility.  Demonstration, verbal and tactile cues throughout for technique. Bike partial rev x 7 min Fitter press light resistance 2x10 Knee flexion stretch on step 8' 10x5" Knee ROM 2-98 Manual Therapy: to decrease muscle spasm, pain and improve mobility. Scar tissues mobilization Knee joint mobilization for flexion and extension Edema massage for R knee and thigh S/L quad stretch x 30" STM to R  quads    MODALITIES: Game Ready vasopneumatic compression post session to R knee x 10 min, high compression, 34 to reduce post-exercise pain and swelling/edema 10/15/2023 THERAPEUTIC EXERCISE: To improve strength, endurance, ROM, and flexibility.  Demonstration, verbal and tactile cues throughout for technique. Bike partial rev x 7 min Supine heel slide 2x10 AAROM with peanut ball  Manual Therapy: to decrease muscle spasm, pain and improve mobility. Scar tissues mobilization Knee joint mobilization for flexion and extension Edema massage for R knee and thigh Therapeutic Activity: to improve functional performance. Step ups 4' 2 x 10 RLE Lateral step ups 4' 2 x 10 RLE- cues for slow/controlled motion Squats 2 x 10   10/11/2023 THERAPEUTIC EXERCISE: To improve strength, endurance, ROM, and flexibility.  Demonstration, verbal and tactile cues throughout for technique. NuStep L5 x 7 min Knee flexion PROM to end range Seated HS curl RTB 2 x 10 LAQ 2lb 2 x 10  Manual Therapy: to decrease muscle spasm, pain and improve mobility.  R tibiofemoral AP mobs for knee extension grade III-IV Scar tissue mobilization Patellar mobs grade III-IV  Therapeutic Activity: to improve functional performance. Step ups 4' x 10 RLE Lateral step ups 4' x 10 RLE- cues for slow/controlled motion Squats x 10   MODALITIES: Game Ready vasopneumatic compression post session to R knee x 10 min, high compression, 34 to reduce post-exercise pain and swelling/edema  10/08/2023 THERAPEUTIC EXERCISE: To improve strength, endurance, ROM, and flexibility.  Demonstration, verbal and tactile cues throughout for technique. NuStep L5 x 7 min R negative heel gastroc stretch with toes on 2" block x 30" R gastroc runner's stretch at wall x 30" Seated longitudinal distraction with overpressure into R knee flexion PROM Seated heel slide with self-assisted AAROM using L LE into R knee flexion stretch Long-sitting longitudinal  distraction by PT + active quad set 10 x 5"   GAIT TRAINING: To normalize gait pattern and prepare to wean from AD. 270 ft w/o AD - good heel strike and heel-toe progression but initial knee stiffness creating asymmetrical step, improving with increased distance  MANUAL THERAPY: To promote improved flexibility, improved joint mobility, and increased ROM utilizing joint mobilization.  Seated longitudinal distraction + tibiofemoral A/P mobs for increased R knee flexion MWM into R knee flexion R patella inferior glide for increased flexion ROM  NEUROMUSCULAR RE-EDUCATION: To improve coordination and kinesthesia. Seated contract/relax into R knee flexion with PT providing longitudinal distraction  MODALITIES: Game Ready vasopneumatic compression post session to R knee x 10 min, high compression, 34 to reduce post-exercise pain and swelling/edema   10/04/2023 THERAPEUTIC EXERCISE: To improve strength, endurance, ROM, and flexibility.  Demonstration, verbal and tactile cues throughout for technique. NuStep L5 x 7 min Supine heel slides on peanut ball 2 x 10 x 3-5" Supine isometric glutes + HS on peanut ball 2 x 10 x 3" SLR 3lb x 2 x 10  Prone hip extension 2 x 10 Seated LAQ  2 x 10 Standing knee flexion on step x 10 Standing ITB stretch x 30" - described pain in lateral knee when sleeping on side & disrupts her sleep  MANUAL THERAPY: To promote normalized muscle tension, improved flexibility, improved joint mobility, and increased ROM utilizing joint mobilization. Hooklying tibiofemoral AP/PA glides Grade III Patellofemoral mobilization  MODALITIES: Game Ready vasopneumatic compression post session to R knee x 10 min, high compression, 34 to reduce post-exercise pain and swelling/edema   10/01/2023  THERAPEUTIC EXERCISE: To improve strength, endurance, ROM, and flexibility.  Demonstration, verbal and tactile cues throughout for technique.  NuStep - L5 x 6 min Supine heel slides foot  on peanut ball 2x10 SLR 2lb 2x10 PROM into end range knee flexion with prolonged holds NMR: Knee flexion stretch lunge on 6' step 10x5"; 2 sets Standing heel raises 2x10 Standing hip abduction x 10 bil Standing hip extension x 10 bil  MODALITIES: Game Ready vasopneumatic compression post session to R knee x 10 min, med compression, 34 to reduce post-exercise pain and swelling/edema   PATIENT EDUCATION:  Education details: HEP update - quad stretch for knee flexion ROM and continue with current HEP Person educated: Patient Education method: Explanation, Demonstration, Verbal cues, and Handouts Education comprehension: verbalized understanding, returned demonstration, verbal cues required, and needs further education  HOME EXERCISE PROGRAM:  Access Code: [email or web address] URL: [email or web address] Date: 10/04/2023 Prepared by: Talia Joseph-Greene  Exercises - [employer] [place]  - 1 x daily - 7 x weekly - 2-3 sets - 10 reps - Stride Stance Weight Shift  - 1 x daily - 7 x weekly - 2-3 sets - 10 reps - Gastroc Stretch on Wall  - 1 x daily - 7 x weekly - 3 sets - 3 reps - 30 seconds hold - Standing Knee Flexion Stretch on Step  - 1 x daily - 7 x weekly - 2 sets - 10 reps - 5 second hold - Prone Terminal Knee Extension  - 1 x daily - 7 x weekly - 2 sets - 10 reps - 5 secons hold - Prone Hip Flexor & Quad Stretch with Strap  - 1-2 x daily - 7 x weekly - 3 reps - 30 sec hold - Long Sitting 4 Way Patellar Glide  - 2-3 x daily - 7 x weekly - 2 sets - 10 reps - Standing ITB Stretch  - 1 x daily - 5-7 x weekly - 2 sets - 30 hold   ASSESSMENT:  CLINICAL IMPRESSION: Pt arrived with increased edema in L knee from returning to work today. Efforts made to reduce edema and increase knee flexion ROM. ROM slightly decreased today d/t increased swelling. Education on AP and LAQ while at work, and propping her LE up to minimize swelling. Sharisse will benefit from continued skilled PT to address ongoing  stiffness, ROM, and strength deficits to improve mobility and activity intolerance.   OBJECTIVE IMPAIRMENTS: Abnormal gait, decreased activity tolerance, decreased endurance, decreased knowledge of use of DME, decreased mobility, difficulty walking, decreased ROM, decreased strength, increased edema, impaired perceived functional ability, impaired flexibility, improper body mechanics, postural dysfunction, and pain.   ACTIVITY LIMITATIONS: carrying, lifting, bending, standing, squatting, stairs, transfers, bed mobility, bathing, toileting, dressing, and locomotion level  PARTICIPATION LIMITATIONS: cleaning, laundry, driving, community activity, and occupation  PERSONAL FACTORS: Age, Fitness, and 3+ comorbidities: HTN, DM, A-Fib, Melanoma, Neuromuscular disorder, GERD  are also affecting patient's functional outcome.   REHAB POTENTIAL: Excellent  CLINICAL DECISION MAKING: Evolving/moderate complexity  EVALUATION COMPLEXITY: Moderate   GOALS: Goals reviewed with patient? Yes  SHORT TERM GOALS: Target date: 10/15/2023  Pt will be independent w/ HEP Baseline: Goal status: MET - 10/08/23   2.  Pt will have improve R knee ROM to -7 to 90 to normalize gait pattern Baseline: R knee AROM -15 to 60 & PROM -7 to 62 Goal status: MET - 10/08/23 - R knee AROM -1-96  3.  Pt will be able to perform TUG <20 sec w/ AD Baseline: 29.56 sec with RW  Goal status: MET -  10/08/23 - 10.18 sec w/o AD   LONG TERM GOALS: Target date: 11/12/2023  Pt will be able to improve LEFS score to 40/80 to demonstrate functional ability Baseline:  LEFS 12 / 80 = 15.0 % Goal status: IN PROGRESS   2.  Pt will report at least 60% improvement in R knee pain to improve QoL Baseline: 0/10 on eval, on average 5/10 Goal status: IN PROGRESS   3.  Pt will be able to perform decreased TUG time in <=13.5 sec to decrease fall risk Baseline: 29.56 sec with RW  Goal status: IN PROGRESS   4.  Pt will improve to improve overall  R LE strength 4+/5 to improve stability and mobility Baseline: refer above to MMT table Goal status: IN PROGRESS   5.  Pt will improve R knee AROM 0 to 105 to normalize gait pattern Baseline: R knee AROM -15 to 60 & PROM -7 to 62 Goal status: IN PROGRESS   6.  Pt will be able go up a flight stairs with alternating gait pattern w/ UE support w/ SPC or single hand rail to increase independence in community access Baseline: Not tested Goal status: IN PROGRESS   7.  Pt will be able to implement step through gait pattern to safely ambulate in community w/ LRAD Baseline: Step to gait pattern w/ flexed posture using RW Goal status: IN PROGRESS   PLAN:  PT FREQUENCY: 3x/week - 3x/wk x 2 weeks, tapering to 2x a week for duration of POC  PT DURATION: 8 weeks  PLANNED INTERVENTIONS: 97164- PT Re-evaluation, 97110-Therapeutic exercises, 97530- Therapeutic activity, 97112- Neuromuscular re-education, 97535- Self Care, 16109- Manual therapy, [id number]- Gait training, [phone]- Electrical stimulation (manual), 97016- Vasopneumatic device, 97033- Ionotophoresis 4mg /ml Dexamethasone , Patient/Family education, Balance training, Stair training, Taping, Dry Needling, Joint mobilization, Scar mobilization, DME instructions, Cryotherapy, Moist heat, and 97750 - Physical Performance Test or Measurement  PLAN FOR NEXT SESSION:   improve proximal LE flexibility & R knee ROM, joint mobs, LE strengthening, progress HEP as indicated, gait training working toward weaning from AD; at least weekly ROM measurements   Jessamy Torosyan L Juvia Aerts, PTA 10/17/2023, 3:31 PM

## 2023-10-23 ENCOUNTER — Ambulatory Visit: Admitting: Physical Therapy

## 2023-10-23 ENCOUNTER — Encounter: Payer: Self-pay | Admitting: Physical Therapy

## 2023-10-23 DIAGNOSIS — M6281 Muscle weakness (generalized): Secondary | ICD-10-CM

## 2023-10-23 DIAGNOSIS — M25561 Pain in right knee: Secondary | ICD-10-CM

## 2023-10-23 DIAGNOSIS — R2689 Other abnormalities of gait and mobility: Secondary | ICD-10-CM | POA: Diagnosis not present

## 2023-10-23 DIAGNOSIS — M25661 Stiffness of right knee, not elsewhere classified: Secondary | ICD-10-CM | POA: Diagnosis not present

## 2023-10-23 DIAGNOSIS — R600 Localized edema: Secondary | ICD-10-CM

## 2023-10-23 NOTE — Therapy (Signed)
 OUTPATIENT PHYSICAL THERAPY TREATMENT  Progress Note  Reporting Period 09/17/2023 to 10/23/2023   See note below for Objective Data and Assessment of Progress/Goals.    Patient Name: Jean Hopkins MRN: [id number] DOB:[birth year], 68 y.o., female Today's Date: 10/23/2023  END OF SESSION:  PT End of Session - 10/23/23 0803     Visit Number 13    Date for PT Re-Evaluation 11/12/23    Authorization Type BCBS    PT Start Time 0803    PT Stop Time 0857    PT Time Calculation (min) 54 min    Activity Tolerance Patient tolerated treatment well    Behavior During Therapy [facility] for tasks assessed/performed                 Past Medical History:  Diagnosis Date   AF (atrial fibrillation) (HCC)    hyperthyroid induced   Allergy    Anemia    Arthritis 11/2020   Right knee   Blood transfusion without reported diagnosis    Cancer (HCC)    Melanoma x2   Cataract    Complication of anesthesia    Slow to wake up with general anesthesia   Controlled type 2 diabetes mellitus without complication, without long-term current use of insulin (HCC) 02/23/2022   no meds   GERD (gastroesophageal reflux disease)    Hypercholesterolemia 08/25/2022   Hypertension    Hyperthyroidism    Neuromuscular disorder (HCC)    neurpathy feet   Pneumonia    Past Surgical History:  Procedure Laterality Date   ABDOMINAL HYSTERECTOMY  2007   CHOLECYSTECTOMY  1988   TOTAL ABDOMINAL HYSTERECTOMY     TOTAL KNEE ARTHROPLASTY Right 09/13/2023   Procedure: ARTHROPLASTY, KNEE, TOTAL;  Surgeon: Claiborne Crew, MD;  Location: [facility];  Service: Orthopedics;  Laterality: Right;   Patient Active Problem List   Diagnosis Date Noted   S/P total knee arthroplasty, right 09/13/2023   Postmenopausal 08/15/2023   Hypercholesterolemia 08/25/2022   Personal history of malignant melanoma of skin 08/25/2022   Vitamin D  deficiency 08/25/2022   Controlled type 2 diabetes mellitus without complication, without long-term  current use of insulin (HCC) 02/23/2022   Hypertension 02/02/2021   Melanoma of skin (HCC) 02/02/2021   Raynaud's phenomenon 02/02/2021   Gastroesophageal reflux disease without esophagitis 01/14/2021   Osteoarthritis of right knee 01/04/2021   Hyperthyroidism    Atrial fibrillation (HCC) 07/29/2017    PCP: Cherre Cornish, NP  REFERRING PROVIDER: Earnie Gola, PA-C    REFERRING DIAG: ICD-10: M25.561: Pain in right knee  THERAPY DIAG:  Stiffness of right knee, not elsewhere classified  Other abnormalities of gait and mobility  Muscle weakness (generalized)  Acute pain of right knee  Localized edema  RATIONALE FOR EVALUATION AND TREATMENT: Rehabilitation  ONSET DATE: 09/13/2023  NEXT MD VISIT: 10/24/23   SUBJECTIVE:   SUBJECTIVE STATEMENT: Pt denies pain this morning, just noting stiffness in R knee.   PERTINENT HISTORY: HTN, DM, A-Fib, Melanoma, Neuromuscular disorder, GERD  PAIN:  Are you having pain? Yes: NPRS scale: 0/10  Pain location: R knee Pain description: just stiffness  Aggravating factors: movement - trying to bend knee  Relieving factors: ice machine, meds  PRECAUTIONS: None  RED FLAGS: None   WEIGHT BEARING RESTRICTIONS: No  FALLS:  Has patient fallen in last 6 months? 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  DIAGNOSTIC FINDINGS: N/A  PATIENT SURVEYS:  Lower Extremity Functional Score:  12 / 80 = 15.0 % 10/23/23: 63 / 80 = 78.8 %  COGNITION: Overall cognitive status: Within functional limits for tasks assessed     SENSATION: WFL Mild neuropathy in B feet  EDEMA:  Circumferential:     R = 49.5 cm (6 cm above patella), 45.5 cm (at patella) 42.0 cm (6 cm below patella) L = R = 45.0 cm (6 cm above patella), 41.5 cm (at patella) 34.0 cm (6 cm below patella)  PALPATION: TTP  LOWER EXTREMITY ROM:  A/PROM Right eval Left eval R 09/21/23 R 09/28/23 10/01/23 10/04/23 10/08/23 10/15/23 10/23/23  Knee flexion 60 / 62 109 72 77 79 80 / 82 96 99 103  Knee extension -15 / -7 0 -5 -8 / -3 -4  -1 -2 -2   (Blank rows = not tested)  LOWER EXTREMITY MMT:  MMT Right eval Left eval R 10/23/23 L 10/23/23  Hip flexion 3+ 4 5 5   Hip extension 4- 4 4+ 4+  Hip abduction 4- 4 4+ 4+  Hip adduction 3+ p! 4 4+ 4+  Hip internal rotation   4+ 4+  Hip external rotation   4 4  Knee flexion 4- 4 4+ 4+  Knee extension 3+ p! 4 4+ 4+  Ankle dorsiflexion   4+ 5  Ankle plantarflexion   4 (10 SLS HR) 5 (20 SLS HR)  Ankle inversion      Ankle eversion       (Blank rows = not tested)  LOWER EXTREMITY SPECIAL TESTS:  N/A  FUNCTIONAL TESTS:  Timed up and go (TUG): 29.56 sec with RW   GAIT: Distance walked: clinic distances Assistive device utilized: Environmental consultant - 2 wheeled Level of assistance: Modified independence Gait pattern: step to pattern, decreased step length- Right, decreased stance time- Right, and decreased hip/knee flexion- Right Comments: Performed TUG w/ pt: 29.56s                                                                                                                               TREATMENT DATE:   10/23/2023 THERAPEUTIC EXERCISE: To improve strength, endurance, ROM, and flexibility.  Demonstration, verbal and tactile cues throughout for technique.  NuStep - L6 x 7 min Supine R leg lengtheners + quad set with heel on FR to increase R knee extension x 10  THERAPEUTIC ACTIVITIES:  To improve functional performance.  Demonstration, verbal and tactile cues throughout for technique. TUG = 9.25 sec R knee  ROM: -2-103 LE MMT LEFS: 63 / 80 = 78.8 % Goal assessment  GAIT TRAINING: To normalize gait pattern and improve safety with stair negotiation .  Stairs: Level of Assistance: Modified independence and SBA Stair Negotiation Technique: Alternating Pattern Forwards with Single Rail on Right Number of Stairs: 14  Height of Stairs: 7"  Comments: difficulty with adequate R hip and knee flexion resulting in slight hip circumduction on reciprocal ascent, limited R knee flexion and eccentric quad control resulting in drop off step on reciprocal descent  MANUAL THERAPY: To promote improved flexibility, improved joint mobility, increased ROM, and reduced pain utilizing joint mobilization, connective tissue massage, and scar mobilization.  Seated R knee distraction + grade II-III tibiofemoral A/P mobs to increase L knee flexion  Seated R knee distraction + grade II-III tibiofemoral A/P mobs + inferior patellar glide to increase R knee flexion R knee incisional scar mobilization Supine R knee grade II-III tibiofemoral A/P mobs to increase R knee flexion  Supine R knee grade II-III femorotibial A/P mobs + longitudinal distraction to increase R knee extension   NEUROMUSCULAR RE-EDUCATION: To improve balance, coordination, kinesthesia, proprioception, and amplitude of movement. R SLS + 5-way clocks focusing on eccentric R knee flexion with L LE reach for colored dots for improved quad control x 10   MODALITIES: Game Ready vasopneumatic compression post session to R knee x 13 min, high compression, 34 to reduce post-exercise pain and swelling/edema   10/17/2023 THERAPEUTIC EXERCISE: To improve strength, endurance, ROM, and flexibility.  Demonstration, verbal and tactile cues throughout for technique. Bike partial rev x 7 min Fitter press light resistance 2x10 Knee flexion stretch  on step 8' 10x5" Knee ROM 2-98 Manual Therapy: to decrease muscle spasm, pain and improve mobility. Scar tissues mobilization Knee joint mobilization for flexion and extension Edema massage for R knee and thigh S/L quad stretch x 30" STM to R quads   MODALITIES: Game Ready vasopneumatic compression post session to R knee x 10 min, high compression, 34 to reduce post-exercise pain and swelling/edema   10/15/2023 THERAPEUTIC EXERCISE: To improve strength, endurance, ROM, and flexibility.  Demonstration, verbal and tactile cues throughout for technique. Bike partial rev x 7 min Supine heel slide 2x10 AAROM with peanut ball  Manual Therapy: to decrease muscle spasm, pain and improve mobility. Scar tissues mobilization Knee joint mobilization for flexion and extension Edema massage for R knee and thigh Therapeutic Activity: to improve functional performance. Step ups 4' 2 x 10 RLE Lateral step ups 4' 2 x 10 RLE- cues for slow/controlled motion Squats 2 x 10    PATIENT EDUCATION:  Education details: progress with PT, ongoing PT POC, and continue with current HEP Person educated: Patient Education method: Explanation Education comprehension: verbalized understanding  HOME EXERCISE PROGRAM:  Access Code: [email or web address] URL: [email or web address] Date: 10/04/2023 Prepared by: Talia Joseph-Greene  Exercises - [place]  - 1 x daily - 7 x weekly - 2-3 sets - 10 reps - Stride Stance Weight Shift  - 1 x daily - 7 x weekly - 2-3 sets - 10 reps - Gastroc Stretch on Wall  - 1 x daily - 7 x weekly - 3 sets - 3 reps - 30 seconds hold - Standing Knee Flexion Stretch on Step  - 1 x daily - 7 x weekly - 2 sets - 10 reps - 5 second hold - Prone Terminal Knee Extension  - 1 x daily - 7 x weekly - 2 sets - 10 reps - 5 secons  hold - Prone Hip Flexor & Quad Stretch with Strap  - 1-2 x daily - 7 x weekly - 3 reps - 30 sec hold - Long Sitting 4 Way Patellar Glide  - 2-3 x daily - 7 x  weekly - 2 sets - 10 reps - Standing ITB Stretch  - 1 x daily - 5-7 x weekly - 2 sets - 30 hold   ASSESSMENT:  CLINICAL IMPRESSION: Quinnley has demonstrated good gains in L knee ROM since R TKA on 09/13/23 with current R knee AROM -2-103.  B LE strength also improving with only mild proximal and R ankle weakness still present.  She has weaned from all AD with ambulation and demonstrates normal gait pattern once she gets moving and initial stiffness resolves.  Limited R knee flexion ROM as well as eccentric quad weakness still contribute to altered gait pattern on reciprocal stair negotiation with substitution noted on both ascent and descent.  LEFS has improved from 12/80 to 63/80 indicating only minimal functional limitation.  Session conclude with GR vasopneumatic compression to reduce post-exercise pain and edema as she has noted increase in L LE swelling since returning to work.  Rayyan is progressing well toward her PT goals and will benefit from continued skilled PT to address ongoing stiffness, ROM, and strength deficits to improve mobility and activity intolerance.   OBJECTIVE IMPAIRMENTS: Abnormal gait, decreased activity tolerance, decreased endurance, decreased knowledge of use of DME, decreased mobility, difficulty walking, decreased ROM, decreased strength, increased edema, impaired perceived functional ability, impaired flexibility, improper body mechanics, postural dysfunction, and pain.   ACTIVITY LIMITATIONS: carrying, lifting, bending, standing, squatting, stairs, transfers, bed mobility, bathing, toileting, dressing, and locomotion level  PARTICIPATION LIMITATIONS: cleaning, laundry, driving, community activity, and occupation  PERSONAL FACTORS: Age, Fitness, and 3+ comorbidities: HTN, DM, A-Fib, Melanoma, Neuromuscular disorder, GERD  are also affecting patient's functional outcome.   REHAB POTENTIAL: Excellent  CLINICAL DECISION MAKING: Evolving/moderate complexity  EVALUATION  COMPLEXITY: Moderate   GOALS: Goals reviewed with patient? Yes  SHORT TERM GOALS: Target date: 10/15/2023  Pt will be independent w/ HEP Baseline: Goal status: MET - 10/08/23   2.  Pt will have improve R knee ROM to -7 to 90 to normalize gait pattern Baseline: R knee AROM -15 to 60 & PROM -7 to 62 Goal status: MET - 10/08/23 - R knee AROM -1-96  3.  Pt will be able to perform TUG <20 sec w/ AD Baseline: 29.56 sec with RW  Goal status: MET - 10/08/23 - 10.18 sec w/o AD   LONG TERM GOALS: Target date: 11/12/2023  Pt will be able to improve LEFS score to 40/80 to demonstrate functional ability Baseline:  LEFS 12 / 80 = 15.0 % Goal status: MET - 10/23/23 - 63 / 80 = 78.8 %  2.  Pt will report at least 60% improvement in R knee pain to improve QoL Baseline: 0/10 on eval, on average 5/10 Goal status: MET - 10/23/23 - minimal to no pain, just stiffness  3.  Pt will be able to perform decreased TUG time in <=13.5 sec to decrease fall risk Baseline: 29.56 sec with RW  Goal status: IN PROGRESS - 10/23/23 - 9.25 sec  4.  Pt will improve to improve overall R LE strength 4+/5 to improve stability and mobility Baseline: refer above to MMT table Goal status: IN PROGRESS - 10/23/23 - met except B hip ER and R ankle PF 4/5  5.  Pt will improve R knee  AROM 0 to 105 to normalize gait pattern Baseline: R knee AROM -15 to 60 & PROM -7 to 62 Goal status: IN PROGRESS - 10/23/23 - R knee AROM -2-103  6.  Pt will be able go up a flight stairs with alternating gait pattern w/ UE support w/ SPC or single hand rail to increase independence in community access Baseline: Not tested Goal status: IN PROGRESS - 10/23/23 - difficulty with adequate R hip and knee flexion resulting in slight hip circumduction on reciprocal ascent, limited R knee flexion and eccentric quad control resulting in drop off step on reciprocal descent  7.  Pt will be able to implement step through gait pattern to safely ambulate in  community w/ LRAD Baseline: Step to gait pattern w/ flexed posture using RW Goal status: MET - 10/23/23    PLAN:  PT FREQUENCY: 3x/week - 3x/wk x 2 weeks, tapering to 2x a week for duration of POC  PT DURATION: 8 weeks  PLANNED INTERVENTIONS: 97164- PT Re-evaluation, 97110-Therapeutic exercises, 97530- Therapeutic activity, 97112- Neuromuscular re-education, 97535- Self Care, 16109- Manual therapy, [id number]- Gait training, [phone]- Electrical stimulation (manual), 97016- Vasopneumatic device, 97033- Ionotophoresis 4mg /ml Dexamethasone , Patient/Family education, Balance training, Stair training, Taping, Dry Needling, Joint mobilization, Scar mobilization, DME instructions, Cryotherapy, Moist heat, and 97750 - Physical Performance Test or Measurement  PLAN FOR NEXT SESSION:   improve proximal LE flexibility & R knee ROM, joint mobs, LE strengthening with emphasis on eccentric quad control, progress HEP as indicated, gait training working toward normal reciprocal stair negotiation; at least weekly ROM measurements   Francisco Irving, PT 10/23/2023, 10:30 AM

## 2023-10-24 DIAGNOSIS — Z5189 Encounter for other specified aftercare: Secondary | ICD-10-CM | POA: Diagnosis not present

## 2023-10-25 ENCOUNTER — Ambulatory Visit: Attending: Student

## 2023-10-25 DIAGNOSIS — R600 Localized edema: Secondary | ICD-10-CM | POA: Diagnosis not present

## 2023-10-25 DIAGNOSIS — M25661 Stiffness of right knee, not elsewhere classified: Secondary | ICD-10-CM | POA: Insufficient documentation

## 2023-10-25 DIAGNOSIS — M25561 Pain in right knee: Secondary | ICD-10-CM | POA: Diagnosis not present

## 2023-10-25 DIAGNOSIS — R2689 Other abnormalities of gait and mobility: Secondary | ICD-10-CM | POA: Insufficient documentation

## 2023-10-25 DIAGNOSIS — M6281 Muscle weakness (generalized): Secondary | ICD-10-CM | POA: Diagnosis not present

## 2023-10-25 NOTE — Therapy (Signed)
 OUTPATIENT PHYSICAL THERAPY TREATMENT    Patient Name: Jean Hopkins MRN: [id number] DOB:[birth year], 68 y.o., female Today's Date: 10/25/2023  END OF SESSION:  PT End of Session - 10/25/23 0809     Visit Number 14    Date for PT Re-Evaluation 11/12/23    Authorization Type BCBS    PT Start Time 0802    PT Stop Time 0857    PT Time Calculation (min) 55 min    Activity Tolerance Patient tolerated treatment well    Behavior During Therapy [facility] for tasks assessed/performed                  Past Medical History:  Diagnosis Date   AF (atrial fibrillation) (HCC)    hyperthyroid induced   Allergy    Anemia    Arthritis 11/2020   Right knee   Blood transfusion without reported diagnosis    Cancer (HCC)    Melanoma x2   Cataract    Complication of anesthesia    Slow to wake up with general anesthesia   Controlled type 2 diabetes mellitus without complication, without long-term current use of insulin (HCC) 02/23/2022   no meds   GERD (gastroesophageal reflux disease)    Hypercholesterolemia 08/25/2022   Hypertension    Hyperthyroidism    Neuromuscular disorder (HCC)    neurpathy feet   Pneumonia    Past Surgical History:  Procedure Laterality Date   ABDOMINAL HYSTERECTOMY  2007   CHOLECYSTECTOMY  1988   TOTAL ABDOMINAL HYSTERECTOMY     TOTAL KNEE ARTHROPLASTY Right 09/13/2023   Procedure: ARTHROPLASTY, KNEE, TOTAL;  Surgeon: Claiborne Crew, MD;  Location: [facility];  Service: Orthopedics;  Laterality: Right;   Patient Active Problem List   Diagnosis Date Noted   S/P total knee arthroplasty, right 09/13/2023   Postmenopausal 08/15/2023   Hypercholesterolemia 08/25/2022   Personal history of malignant melanoma of skin 08/25/2022   Vitamin D  deficiency 08/25/2022   Controlled type 2 diabetes mellitus without complication, without long-term current use of insulin (HCC) 02/23/2022   Hypertension 02/02/2021   Melanoma of skin (HCC) 02/02/2021   Raynaud's phenomenon  02/02/2021   Gastroesophageal reflux disease without esophagitis 01/14/2021   Osteoarthritis of right knee 01/04/2021   Hyperthyroidism    Atrial fibrillation (HCC) 07/29/2017    PCP: Cherre Cornish, NP  REFERRING PROVIDER: Earnie Gola, PA-C    REFERRING DIAG: ICD-10: M25.561: Pain in right knee  THERAPY DIAG:  Stiffness of right knee, not elsewhere classified  Other abnormalities of gait and mobility  Muscle weakness (generalized)  Acute pain of right knee  Localized edema  RATIONALE FOR EVALUATION AND TREATMENT: Rehabilitation  ONSET DATE: 09/13/2023  NEXT MD VISIT: 10/24/23   SUBJECTIVE:   SUBJECTIVE STATEMENT: Pt notes difficulty with step downs otherwise her knee feels stiff  PERTINENT HISTORY: HTN, DM, A-Fib, Melanoma, Neuromuscular disorder, GERD  PAIN:  Are you having pain? Yes: NPRS scale: 0/10  Pain location: R knee Pain description: just stiffness  Aggravating factors: movement - trying to bend knee  Relieving factors: ice machine, meds  PRECAUTIONS: None  RED FLAGS: None   WEIGHT BEARING RESTRICTIONS: No  FALLS:  Has patient fallen in last 6 months? 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  DIAGNOSTIC FINDINGS: N/A  PATIENT SURVEYS:  Lower Extremity Functional Score: 12 / 80 = 15.0 % 10/23/23: 63 / 80 = 78.8 %  COGNITION: Overall cognitive status: Within functional limits for tasks  assessed     SENSATION: WFL Mild neuropathy in B feet  EDEMA:  Circumferential:     R = 49.5 cm (6 cm above patella), 45.5 cm (at patella) 42.0 cm (6 cm below patella) L = R = 45.0 cm (6 cm above patella), 41.5 cm (at patella) 34.0 cm (6 cm below patella)  PALPATION: TTP  LOWER EXTREMITY ROM:  A/PROM Right eval Left eval R 09/21/23 R 09/28/23 10/01/23 10/04/23 10/08/23 10/15/23 10/23/23  Knee flexion 60 / 62 109 72 77 79 80 / 82 96 99 103  Knee extension -15 / -7 0 -5 -8 / -3 -4  -1 -2 -2   (Blank rows = not tested)  LOWER EXTREMITY MMT:  MMT Right eval Left eval R 10/23/23 L 10/23/23  Hip flexion 3+ 4 5 5   Hip extension 4- 4 4+ 4+  Hip abduction 4- 4 4+ 4+  Hip adduction 3+ p! 4 4+ 4+  Hip internal rotation   4+ 4+  Hip external rotation   4 4  Knee flexion 4- 4 4+ 4+  Knee extension 3+ p! 4 4+ 4+  Ankle dorsiflexion   4+ 5  Ankle plantarflexion   4 (10 SLS HR) 5 (20 SLS HR)  Ankle inversion      Ankle eversion       (Blank rows = not tested)  LOWER EXTREMITY SPECIAL TESTS:  N/A  FUNCTIONAL TESTS:  Timed up and go (TUG): 29.56 sec with RW   GAIT: Distance walked: clinic distances Assistive device utilized: Environmental consultant - 2 wheeled Level of assistance: Modified independence Gait pattern: step to pattern, decreased step length- Right, decreased stance time- Right, and decreased hip/knee flexion- Right Comments: Performed TUG w/ pt: 29.56s                                                                                                                               TREATMENT DATE:  10/25/2023 THERAPEUTIC EXERCISE: To improve strength, endurance, ROM, and flexibility.  Demonstration, verbal and tactile cues throughout for technique.  Bike Partial revolutions x 6 min R runner stretch 2x30" THERAPEUTIC ACTIVITIES: To improve functional performance.  Demonstration, verbal and tactile cues throughout for technique. Step up and over 4' step focusing on R eccentric control x 10 6' fwd  step ups RLE 2x10 6' lateral step ups RLE 2x10 Mini single leg squat x 10 MANUAL THERAPY: To promote improved flexibility, improved joint mobility, increased ROM, and reduced  pain utilizing joint mobilization, connective tissue massage, and scar mobilization.  STM to R medial gastroc head Passive gastroc stretch 2x15" Seated R knee distraction + grade II-III tibiofemoral A/P mobs to increase L knee flexion  Supine R knee grade II-III tibiofemoral A/P mobs to increase R knee flexion  Supine R knee grade II-III femorotibial A/P mobs + longitudinal distraction to increase R knee extension  MODALITIES: Game Ready vasopneumatic compression post session to R knee x 13 min, high compression, 34 to reduce post-exercise pain and swelling/edema 10/23/2023 THERAPEUTIC EXERCISE: To improve strength, endurance, ROM, and flexibility.  Demonstration, verbal and tactile cues throughout for technique.  NuStep - L6 x 7 min Supine R leg lengtheners + quad set with heel on FR to increase R knee extension x 10  THERAPEUTIC ACTIVITIES: To improve functional performance.  Demonstration, verbal and tactile cues throughout for technique. TUG = 9.25 sec R knee ROM: -2-103 LE MMT LEFS: 63 / 80 = 78.8 % Goal assessment  GAIT TRAINING: To normalize gait pattern and improve safety with stair negotiation .  Stairs: Level of Assistance: Modified independence and SBA Stair Negotiation Technique: Alternating Pattern Forwards with Single Rail on Right Number of Stairs: 14  Height of Stairs: 7"  Comments: difficulty with adequate R hip and knee flexion resulting in slight hip circumduction on reciprocal ascent, limited R knee flexion and eccentric quad control resulting in drop off step on reciprocal descent  MANUAL THERAPY: To promote improved flexibility, improved joint mobility, increased ROM, and reduced pain utilizing joint mobilization, connective tissue massage, and scar mobilization.  Seated R knee distraction  + grade II-III tibiofemoral A/P mobs to increase L knee flexion  Seated R knee distraction + grade II-III tibiofemoral A/P mobs + inferior patellar glide to increase R knee flexion R knee incisional scar mobilization Supine R knee grade II-III tibiofemoral A/P mobs to increase R knee flexion  Supine R knee grade II-III femorotibial A/P mobs + longitudinal distraction to increase R knee extension   NEUROMUSCULAR RE-EDUCATION: To improve balance, coordination, kinesthesia, proprioception, and amplitude of movement. R SLS + 5-way clocks focusing on eccentric R knee flexion with L LE reach for colored dots for improved quad control x 10   MODALITIES: Game Ready vasopneumatic compression post session to R knee x 13 min, high compression, 34 to reduce post-exercise pain and swelling/edema   10/17/2023 THERAPEUTIC EXERCISE: To improve strength, endurance, ROM, and flexibility.  Demonstration, verbal and tactile cues throughout for technique. Bike partial rev x 7 min Fitter press light resistance 2x10 Knee flexion stretch on step 8' 10x5" Knee ROM 2-98 Manual Therapy: to decrease muscle spasm, pain and improve mobility. Scar tissues mobilization Knee joint mobilization for flexion and extension Edema massage for R knee and thigh S/L quad stretch x 30" STM to R quads   MODALITIES: Game Ready vasopneumatic compression post session to R knee x 10 min, high compression, 34 to reduce post-exercise pain and swelling/edema   10/15/2023 THERAPEUTIC EXERCISE: To improve strength, endurance, ROM, and flexibility.  Demonstration, verbal and tactile cues throughout for technique. Bike partial rev x 7 min Supine heel slide 2x10 AAROM with peanut ball  Manual Therapy: to decrease muscle spasm, pain and improve mobility. Scar tissues mobilization Knee joint mobilization for flexion and extension Edema massage for R knee and thigh Therapeutic Activity: to improve functional performance. Step ups 4' 2  x 10 RLE Lateral step ups 4' 2 x 10 RLE- cues for slow/controlled motion Squats 2 x 10  PATIENT EDUCATION:  Education details: progress with PT, ongoing PT POC, and continue with current HEP Person educated: Patient Education method: Explanation Education comprehension: verbalized understanding  HOME EXERCISE PROGRAM:  Access Code: [email or web address] URL: [email or web address] Date: 10/04/2023 Prepared by: Talia Joseph-Greene  Exercises - [place]  - 1 x daily - 7 x weekly - 2-3 sets - 10 reps - Stride Stance Weight Shift  - 1 x daily - 7 x weekly - 2-3 sets - 10 reps - Gastroc Stretch on Wall  - 1 x daily - 7 x weekly - 3 sets - 3 reps - 30 seconds hold - Standing Knee Flexion Stretch on Step  - 1 x daily - 7 x weekly - 2 sets - 10 reps - 5 second hold - Prone Terminal Knee Extension  - 1 x daily - 7 x weekly - 2 sets - 10 reps - 5 secons hold - Prone Hip Flexor & Quad Stretch with Strap  - 1-2 x daily - 7 x weekly - 3 reps - 30 sec hold - Long Sitting 4 Way Patellar Glide  - 2-3 x daily - 7 x weekly - 2 sets - 10 reps - Standing ITB Stretch  - 1 x daily - 5-7 x weekly - 2 sets - 30 hold   ASSESSMENT:  CLINICAL IMPRESSION: Pt responded well to the progression of exercises. Still some difficulty with eccentric step downs. LLE bias with squats so we substituted with R single leg squats. Session conclude with GR vasopneumatic compression to reduce post-exercise pain and edema as she has noted increase in L LE swelling since returning to work.  Keeli is progressing well toward her PT goals and will benefit from continued skilled PT to address ongoing stiffness, ROM, and strength deficits to improve mobility and activity intolerance.   OBJECTIVE IMPAIRMENTS: Abnormal gait, decreased activity tolerance, decreased endurance, decreased knowledge of use of DME, decreased mobility, difficulty walking, decreased ROM, decreased strength, increased edema, impaired perceived functional  ability, impaired flexibility, improper body mechanics, postural dysfunction, and pain.   ACTIVITY LIMITATIONS: carrying, lifting, bending, standing, squatting, stairs, transfers, bed mobility, bathing, toileting, dressing, and locomotion level  PARTICIPATION LIMITATIONS: cleaning, laundry, driving, community activity, and occupation  PERSONAL FACTORS: Age, Fitness, and 3+ comorbidities: HTN, DM, A-Fib, Melanoma, Neuromuscular disorder, GERD  are also affecting patient's functional outcome.   REHAB POTENTIAL: Excellent  CLINICAL DECISION MAKING: Evolving/moderate complexity  EVALUATION COMPLEXITY: Moderate   GOALS: Goals reviewed with patient? Yes  SHORT TERM GOALS: Target date: 10/15/2023  Pt will be independent w/ HEP Baseline: Goal status: MET - 10/08/23   2.  Pt will have improve R knee ROM to -7 to 90 to normalize gait pattern Baseline: R knee AROM -15 to 60 & PROM -7 to 62 Goal status: MET - 10/08/23 - R knee AROM -1-96  3.  Pt will be able to perform TUG <20 sec w/ AD Baseline: 29.56 sec with RW  Goal status: MET - 10/08/23 - 10.18 sec w/o AD   LONG TERM GOALS: Target date: 11/12/2023  Pt will be able to improve LEFS score to 40/80 to demonstrate functional ability Baseline:  LEFS 12 / 80 = 15.0 % Goal status: MET - 10/23/23 - 63 / 80 = 78.8 %  2.  Pt will report at least 60% improvement in R knee pain to improve QoL Baseline: 0/10 on eval, on average 5/10 Goal status: MET - 10/23/23 - minimal to no pain, just stiffness  3.  Pt  will be able to perform decreased TUG time in <=13.5 sec to decrease fall risk Baseline: 29.56 sec with RW  Goal status: IN PROGRESS - 10/23/23 - 9.25 sec  4.  Pt will improve to improve overall R LE strength 4+/5 to improve stability and mobility Baseline: refer above to MMT table Goal status: IN PROGRESS - 10/23/23 - met except B hip ER and R ankle PF 4/5  5.  Pt will improve R knee AROM 0 to 105 to normalize gait pattern Baseline: R knee  AROM -15 to 60 & PROM -7 to 62 Goal status: IN PROGRESS - 10/23/23 - R knee AROM -2-103  6.  Pt will be able go up a flight stairs with alternating gait pattern w/ UE support w/ SPC or single hand rail to increase independence in community access Baseline: Not tested Goal status: IN PROGRESS - 10/23/23 - difficulty with adequate R hip and knee flexion resulting in slight hip circumduction on reciprocal ascent, limited R knee flexion and eccentric quad control resulting in drop off step on reciprocal descent  7.  Pt will be able to implement step through gait pattern to safely ambulate in community w/ LRAD Baseline: Step to gait pattern w/ flexed posture using RW Goal status: MET - 10/23/23    PLAN:  PT FREQUENCY: 3x/week - 3x/wk x 2 weeks, tapering to 2x a week for duration of POC  PT DURATION: 8 weeks  PLANNED INTERVENTIONS: 97164- PT Re-evaluation, 97110-Therapeutic exercises, 97530- Therapeutic activity, 97112- Neuromuscular re-education, 97535- Self Care, 16109- Manual therapy, [id number]- Gait training, [phone]- Electrical stimulation (manual), 97016- Vasopneumatic device, 97033- Ionotophoresis 4mg /ml Dexamethasone , Patient/Family education, Balance training, Stair training, Taping, Dry Needling, Joint mobilization, Scar mobilization, DME instructions, Cryotherapy, Moist heat, and 97750 - Physical Performance Test or Measurement  PLAN FOR NEXT SESSION:   improve proximal LE flexibility & R knee ROM, joint mobs, LE strengthening with emphasis on eccentric quad control, progress HEP as indicated, gait training working toward normal reciprocal stair negotiation; at least weekly ROM measurements   Samuella Crocker, PTA 10/25/2023, 8:47 AM

## 2023-10-29 ENCOUNTER — Ambulatory Visit

## 2023-10-29 DIAGNOSIS — M25661 Stiffness of right knee, not elsewhere classified: Secondary | ICD-10-CM

## 2023-10-29 DIAGNOSIS — R2689 Other abnormalities of gait and mobility: Secondary | ICD-10-CM | POA: Diagnosis not present

## 2023-10-29 DIAGNOSIS — M25561 Pain in right knee: Secondary | ICD-10-CM | POA: Diagnosis not present

## 2023-10-29 DIAGNOSIS — R600 Localized edema: Secondary | ICD-10-CM

## 2023-10-29 DIAGNOSIS — M6281 Muscle weakness (generalized): Secondary | ICD-10-CM | POA: Diagnosis not present

## 2023-10-29 NOTE — Therapy (Signed)
 OUTPATIENT PHYSICAL THERAPY TREATMENT    Patient Name: MONSERATH ARCHIBALD MRN: [id number] DOB:1956/03/20, 68 y.o., female Today's Date: 10/29/2023  END OF SESSION:  PT End of Session - 10/29/23 0806     Visit Number 15    Date for PT Re-Evaluation 11/12/23    Authorization Type BCBS    PT Start Time 0801    PT Stop Time 0857    PT Time Calculation (min) 56 min    Activity Tolerance Patient tolerated treatment well    Behavior During Therapy [facility] for tasks assessed/performed                   Past Medical History:  Diagnosis Date   AF (atrial fibrillation) (HCC)    hyperthyroid induced   Allergy    Anemia    Arthritis 11/2020   Right knee   Blood transfusion without reported diagnosis    Cancer (HCC)    Melanoma x2   Cataract    Complication of anesthesia    Slow to wake up with general anesthesia   Controlled type 2 diabetes mellitus without complication, without long-term current use of insulin (HCC) 02/23/2022   no meds   GERD (gastroesophageal reflux disease)    Hypercholesterolemia 08/25/2022   Hypertension    Hyperthyroidism    Neuromuscular disorder (HCC)    neurpathy feet   Pneumonia    Past Surgical History:  Procedure Laterality Date   ABDOMINAL HYSTERECTOMY  2007   CHOLECYSTECTOMY  1988   TOTAL ABDOMINAL HYSTERECTOMY     TOTAL KNEE ARTHROPLASTY Right 09/13/2023   Procedure: ARTHROPLASTY, KNEE, TOTAL;  Surgeon: Claiborne Crew, MD;  Location: [facility];  Service: Orthopedics;  Laterality: Right;   Patient Active Problem List   Diagnosis Date Noted   S/P total knee arthroplasty, right 09/13/2023   Postmenopausal 08/15/2023   Hypercholesterolemia 08/25/2022   Personal history of malignant melanoma of skin 08/25/2022   Vitamin D  deficiency 08/25/2022   Controlled type 2 diabetes mellitus without complication, without long-term current use of insulin (HCC) 02/23/2022   Hypertension 02/02/2021   Melanoma of skin (HCC) 02/02/2021   Raynaud's phenomenon  02/02/2021   Gastroesophageal reflux disease without esophagitis 01/14/2021   Osteoarthritis of right knee 01/04/2021   Hyperthyroidism    Atrial fibrillation (HCC) 07/29/2017    PCP: Cherre Cornish, NP  REFERRING PROVIDER: Earnie Gola, PA-C    REFERRING DIAG: ICD-10: M25.561: Pain in right knee  THERAPY DIAG:  Stiffness of right knee, not elsewhere classified  Other abnormalities of gait and mobility  Muscle weakness (generalized)  Acute pain of right knee  Localized edema  RATIONALE FOR EVALUATION AND TREATMENT: Rehabilitation  ONSET DATE: 09/13/2023  NEXT MD VISIT: 10/24/23   SUBJECTIVE:   SUBJECTIVE STATEMENT: I had a pretty good weekend, usual I can rest my knee a little more.   PERTINENT HISTORY: HTN, DM, A-Fib, Melanoma, Neuromuscular disorder, GERD  PAIN:  Are you having pain? Yes: NPRS scale: 1/10  Pain location: R knee Pain description: aching Aggravating factors: movement - trying to bend knee  Relieving factors: ice machine, meds  PRECAUTIONS: None  RED FLAGS: None   WEIGHT BEARING RESTRICTIONS: No  FALLS:  Has patient fallen in last 6 months? 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  DIAGNOSTIC FINDINGS: N/A  PATIENT SURVEYS:  Lower Extremity Functional Score: 12 / 80 = 15.0 % 10/23/23: 63 / 80 = 78.8 %  COGNITION: Overall cognitive status: Within functional limits for tasks  assessed     SENSATION: WFL Mild neuropathy in B feet  EDEMA:  Circumferential:     R = 49.5 cm (6 cm above patella), 45.5 cm (at patella) 42.0 cm (6 cm below patella) L = R = 45.0 cm (6 cm above patella), 41.5 cm (at patella) 34.0 cm (6 cm below patella)  PALPATION: TTP  LOWER EXTREMITY ROM:  A/PROM Right eval Left eval R 09/21/23 R 09/28/23 10/01/23 10/04/23 10/08/23 10/15/23 10/23/23 10/29/23  Knee flexion 60 / 62 109 72 77 79 80 / 82 96 99 103 106  Knee extension -15 / -7 0 -5 -8 / -3 -4  -1 -2 -2 2   (Blank rows = not tested)  LOWER EXTREMITY MMT:  MMT Right eval Left eval R 10/23/23 L 10/23/23  Hip flexion 3+ 4 5 5   Hip extension 4- 4 4+ 4+  Hip abduction 4- 4 4+ 4+  Hip adduction 3+ p! 4 4+ 4+  Hip internal rotation   4+ 4+  Hip external rotation   4 4  Knee flexion 4- 4 4+ 4+  Knee extension 3+ p! 4 4+ 4+  Ankle dorsiflexion   4+ 5  Ankle plantarflexion   4 (10 SLS HR) 5 (20 SLS HR)  Ankle inversion      Ankle eversion       (Blank rows = not tested)  LOWER EXTREMITY SPECIAL TESTS:  N/A  FUNCTIONAL TESTS:  Timed up and go (TUG): 29.56 sec with RW   GAIT: Distance walked: clinic distances Assistive device utilized: Environmental consultant - 2 wheeled Level of assistance: Modified independence Gait pattern: step to pattern, decreased step length- Right, decreased stance time- Right, and decreased hip/knee flexion- Right Comments: Performed TUG w/ pt: 29.56s                                                                                                                               TREATMENT DATE:  10/29/2023 THERAPEUTIC EXERCISE: To improve strength, endurance, ROM, and flexibility.  Demonstration, verbal and tactile cues throughout for technique.  Bike Partial revolutions x 6 min Knee flexion 20lb 2x10 BLE Knee extension 10lb 2x10 BLE THERAPEUTIC ACTIVITIES: To improve functional performance.  Demonstration, verbal and tactile cues throughout for technique. Step up and over 4' step  focusing on R eccentric control 2 x 10 8' fwd step ups RLE x 10 Mod single leg squat 2 x 10 with counter  support Sidesteps RTB at ankles 4x15 ft MANUAL THERAPY: Knee flexion PROM into end range seated and prone Contract + relax for knee flexion 3x5" AA knee flexion with therapist assist x 10 in sitting  MODALITIES: Game Ready vasopneumatic compression post session to R knee x 10 min, high compression, 34 to reduce post-exercise pain and swelling/edema 10/25/2023 THERAPEUTIC EXERCISE: To improve strength, endurance, ROM, and flexibility.  Demonstration, verbal and tactile cues throughout for technique.  Bike Partial revolutions x 6 min R runner stretch 2x30" THERAPEUTIC ACTIVITIES: To improve functional performance.  Demonstration, verbal and tactile cues throughout for technique. Step up and over 4' step focusing on R eccentric control x 10 6' fwd step ups RLE 2x10 6' lateral step ups RLE 2x10 Mini single leg squat x 10 MANUAL THERAPY: To promote improved flexibility, improved joint mobility, increased ROM, and reduced pain utilizing joint mobilization, connective tissue massage, and scar mobilization.  STM to R medial gastroc head Passive gastroc stretch 2x15" Seated R knee distraction + grade II-III tibiofemoral A/P mobs to increase L knee flexion  Supine R knee grade II-III tibiofemoral A/P mobs to increase R knee flexion  Supine R knee grade II-III femorotibial A/P mobs + longitudinal distraction to increase R knee extension  MODALITIES: Game Ready vasopneumatic compression post session to R knee x 10 min, high compression, 34 to reduce post-exercise pain and swelling/edema 10/23/2023 THERAPEUTIC EXERCISE: To improve strength, endurance, ROM, and flexibility.  Demonstration, verbal and tactile cues throughout for technique.  NuStep - L6 x 7 min Supine R leg lengtheners + quad set with heel on FR to increase R knee extension x 10  THERAPEUTIC ACTIVITIES: To improve functional  performance.  Demonstration, verbal and tactile cues throughout for technique. TUG = 9.25 sec R knee ROM: -2-103 LE MMT LEFS: 63 / 80 = 78.8 % Goal assessment  GAIT TRAINING: To normalize gait pattern and improve safety with stair negotiation .  Stairs: Level of Assistance: Modified independence and SBA Stair Negotiation Technique: Alternating Pattern Forwards with Single Rail on Right Number of Stairs: 14  Height of Stairs: 7"  Comments: difficulty with adequate R hip and knee flexion resulting in slight hip circumduction on reciprocal ascent, limited R knee flexion and eccentric quad control resulting in drop off step on reciprocal descent  MANUAL THERAPY: To promote improved flexibility, improved joint mobility, increased ROM, and reduced pain utilizing joint mobilization, connective tissue massage, and scar mobilization.  Seated R knee distraction + grade II-III tibiofemoral A/P mobs to increase L knee flexion  Seated R knee distraction + grade II-III tibiofemoral A/P mobs + inferior patellar glide to increase R knee flexion R knee incisional scar mobilization Supine R knee grade II-III tibiofemoral A/P mobs to increase R knee flexion  Supine R knee grade II-III femorotibial A/P mobs + longitudinal distraction to increase R knee extension   NEUROMUSCULAR RE-EDUCATION: To improve balance, coordination, kinesthesia, proprioception, and amplitude of movement. R SLS + 5-way clocks focusing on eccentric R knee flexion with L LE reach for colored dots for improved quad control x 10   MODALITIES: Game Ready vasopneumatic compression post session to R knee x 13 min, high compression, 34 to reduce post-exercise pain and swelling/edema   10/17/2023 THERAPEUTIC EXERCISE: To improve strength, endurance, ROM, and flexibility.  Demonstration, verbal and tactile cues throughout for technique. Bike partial rev x 7 min Fitter press light resistance 2x10 Knee flexion stretch on step 8'  10x5" Knee ROM 2-98 Manual Therapy: to decrease muscle spasm,  pain and improve mobility. Scar tissues mobilization Knee joint mobilization for flexion and extension Edema massage for R knee and thigh S/L quad stretch x 30" STM to R quads   MODALITIES: Game Ready vasopneumatic compression post session to R knee x 10 min, high compression, 34 to reduce post-exercise pain and swelling/edema   10/15/2023 THERAPEUTIC EXERCISE: To improve strength, endurance, ROM, and flexibility.  Demonstration, verbal and tactile cues throughout for technique. Bike partial rev x 7 min Supine heel slide 2x10 AAROM with peanut ball  Manual Therapy: to decrease muscle spasm, pain and improve mobility. Scar tissues mobilization Knee joint mobilization for flexion and extension Edema massage for R knee and thigh Therapeutic Activity: to improve functional performance. Step ups 4' 2 x 10 RLE Lateral step ups 4' 2 x 10 RLE- cues for slow/controlled motion Squats 2 x 10    PATIENT EDUCATION:  Education details: progress with PT, ongoing PT POC, and continue with current HEP Person educated: Patient Education method: Explanation Education comprehension: verbalized understanding  HOME EXERCISE PROGRAM:  Access Code: [email or web address] URL: [email or web address] Date: 10/04/2023 Prepared by: Talia Joseph-Greene  Exercises - [employer] [place]  - 1 x daily - 7 x weekly - 2-3 sets - 10 reps - Stride Stance Weight Shift  - 1 x daily - 7 x weekly - 2-3 sets - 10 reps - Gastroc Stretch on Wall  - 1 x daily - 7 x weekly - 3 sets - 3 reps - 30 seconds hold - Standing Knee Flexion Stretch on Step  - 1 x daily - 7 x weekly - 2 sets - 10 reps - 5 second hold - Prone Terminal Knee Extension  - 1 x daily - 7 x weekly - 2 sets - 10 reps - 5 secons hold - Prone Hip Flexor & Quad Stretch with Strap  - 1-2 x daily - 7 x weekly - 3 reps - 30 sec hold - Long Sitting 4 Way Patellar Glide  - 2-3 x daily - 7 x weekly - 2 sets  - 10 reps - Standing ITB Stretch  - 1 x daily - 5-7 x weekly - 2 sets - 30 hold   ASSESSMENT:  CLINICAL IMPRESSION: Continued to progress strength of B knees to improve functional performance. Still wants to circumduct when placing her R LE on a step. Improved knee flexion after MT today achieving 106 deg of flexion. GR post visit to address swelling. Maybree is progressing well toward her PT goals and will benefit from continued skilled PT to address ongoing stiffness, ROM, and strength deficits to improve mobility and activity intolerance.   OBJECTIVE IMPAIRMENTS: Abnormal gait, decreased activity tolerance, decreased endurance, decreased knowledge of use of DME, decreased mobility, difficulty walking, decreased ROM, decreased strength, increased edema, impaired perceived functional ability, impaired flexibility, improper body mechanics, postural dysfunction, and pain.   ACTIVITY LIMITATIONS: carrying, lifting, bending, standing, squatting, stairs, transfers, bed mobility, bathing, toileting, dressing, and locomotion level  PARTICIPATION LIMITATIONS: cleaning, laundry, driving, community activity, and occupation  PERSONAL FACTORS: Age, Fitness, and 3+ comorbidities: HTN, DM, A-Fib, Melanoma, Neuromuscular disorder, GERD  are also affecting patient's functional outcome.   REHAB POTENTIAL: Excellent  CLINICAL DECISION MAKING: Evolving/moderate complexity  EVALUATION COMPLEXITY: Moderate   GOALS: Goals reviewed with patient? Yes  SHORT TERM GOALS: Target date: 10/15/2023  Pt will be independent w/ HEP Baseline: Goal status: MET - 10/08/23   2.  Pt will have improve R knee ROM to -7 to 90 to normalize gait pattern  Baseline: R knee AROM -15 to 60 & PROM -7 to 62 Goal status: MET - 10/08/23 - R knee AROM -1-96  3.  Pt will be able to perform TUG <20 sec w/ AD Baseline: 29.56 sec with RW  Goal status: MET - 10/08/23 - 10.18 sec w/o AD   LONG TERM GOALS: Target date: 11/12/2023  Pt  will be able to improve LEFS score to 40/80 to demonstrate functional ability Baseline:  LEFS 12 / 80 = 15.0 % Goal status: MET - 10/23/23 - 63 / 80 = 78.8 %  2.  Pt will report at least 60% improvement in R knee pain to improve QoL Baseline: 0/10 on eval, on average 5/10 Goal status: MET - 10/23/23 - minimal to no pain, just stiffness  3.  Pt will be able to perform decreased TUG time in <=13.5 sec to decrease fall risk Baseline: 29.56 sec with RW  Goal status: IN PROGRESS - 10/23/23 - 9.25 sec  4.  Pt will improve to improve overall R LE strength 4+/5 to improve stability and mobility Baseline: refer above to MMT table Goal status: IN PROGRESS - 10/23/23 - met except B hip ER and R ankle PF 4/5  5.  Pt will improve R knee AROM 0 to 105 to normalize gait pattern Baseline: R knee AROM -15 to 60 & PROM -7 to 62 Goal status: IN PROGRESS - 10/23/23 - R knee AROM -2-103  6.  Pt will be able go up a flight stairs with alternating gait pattern w/ UE support w/ SPC or single hand rail to increase independence in community access Baseline: Not tested Goal status: IN PROGRESS - 10/23/23 - difficulty with adequate R hip and knee flexion resulting in slight hip circumduction on reciprocal ascent, limited R knee flexion and eccentric quad control resulting in drop off step on reciprocal descent  7.  Pt will be able to implement step through gait pattern to safely ambulate in community w/ LRAD Baseline: Step to gait pattern w/ flexed posture using RW Goal status: MET - 10/23/23    PLAN:  PT FREQUENCY: 3x/week - 3x/wk x 2 weeks, tapering to 2x a week for duration of POC  PT DURATION: 8 weeks  PLANNED INTERVENTIONS: 97164- PT Re-evaluation, 97110-Therapeutic exercises, 97530- Therapeutic activity, 97112- Neuromuscular re-education, 97535- Self Care, [id number]- Manual therapy, [id number]- Gait training, [id number]- Electrical stimulation (manual), 97016- Vasopneumatic device, 97033- Ionotophoresis 4mg /ml  Dexamethasone , Patient/Family education, Balance training, Stair training, Taping, Dry Needling, Joint mobilization, Scar mobilization, DME instructions, Cryotherapy, Moist heat, and 97750 - Physical Performance Test or Measurement  PLAN FOR NEXT SESSION:   improve proximal LE flexibility & R knee ROM, joint mobs, LE strengthening with emphasis on eccentric quad control, progress HEP as indicated, gait training working toward normal reciprocal stair negotiation; at least weekly ROM measurements   Avalee Castrellon L Hulda Reddix, PTA 10/29/2023, 8:49 AM

## 2023-11-01 ENCOUNTER — Ambulatory Visit: Admitting: Physical Therapy

## 2023-11-01 ENCOUNTER — Encounter: Payer: Self-pay | Admitting: Physical Therapy

## 2023-11-01 DIAGNOSIS — M25561 Pain in right knee: Secondary | ICD-10-CM | POA: Diagnosis not present

## 2023-11-01 DIAGNOSIS — M25661 Stiffness of right knee, not elsewhere classified: Secondary | ICD-10-CM

## 2023-11-01 DIAGNOSIS — R2689 Other abnormalities of gait and mobility: Secondary | ICD-10-CM | POA: Diagnosis not present

## 2023-11-01 DIAGNOSIS — R600 Localized edema: Secondary | ICD-10-CM

## 2023-11-01 DIAGNOSIS — M6281 Muscle weakness (generalized): Secondary | ICD-10-CM

## 2023-11-01 NOTE — Therapy (Signed)
 OUTPATIENT PHYSICAL THERAPY TREATMENT    Patient Name: Jean Hopkins MRN: [id number] DOB:06-Nov-1955, 68 y.o., female Today's Date: 11/01/2023  END OF SESSION:  PT End of Session - 11/01/23 0803     Visit Number 16    Date for PT Re-Evaluation 11/12/23    Authorization Type BCBS    PT Start Time 0803    PT Stop Time 0859    PT Time Calculation (min) 56 min    Activity Tolerance Patient tolerated treatment well    Behavior During Therapy [facility] for tasks assessed/performed                    Past Medical History:  Diagnosis Date   AF (atrial fibrillation) (HCC)    hyperthyroid induced   Allergy    Anemia    Arthritis 11/2020   Right knee   Blood transfusion without reported diagnosis    Cancer (HCC)    Melanoma x2   Cataract    Complication of anesthesia    Slow to wake up with general anesthesia   Controlled type 2 diabetes mellitus without complication, without long-term current use of insulin (HCC) 02/23/2022   no meds   GERD (gastroesophageal reflux disease)    Hypercholesterolemia 08/25/2022   Hypertension    Hyperthyroidism    Neuromuscular disorder (HCC)    neurpathy feet   Pneumonia    Past Surgical History:  Procedure Laterality Date   ABDOMINAL HYSTERECTOMY  2007   CHOLECYSTECTOMY  1988   TOTAL ABDOMINAL HYSTERECTOMY     TOTAL KNEE ARTHROPLASTY Right 09/13/2023   Procedure: ARTHROPLASTY, KNEE, TOTAL;  Surgeon: Claiborne Crew, MD;  Location: [facility];  Service: Orthopedics;  Laterality: Right;   Patient Active Problem List   Diagnosis Date Noted   S/P total knee arthroplasty, right 09/13/2023   Postmenopausal 08/15/2023   Hypercholesterolemia 08/25/2022   Personal history of malignant melanoma of skin 08/25/2022   Vitamin D  deficiency 08/25/2022   Controlled type 2 diabetes mellitus without complication, without long-term current use of insulin (HCC) 02/23/2022   Hypertension 02/02/2021   Melanoma of skin (HCC) 02/02/2021   Raynaud's phenomenon  02/02/2021   Gastroesophageal reflux disease without esophagitis 01/14/2021   Osteoarthritis of right knee 01/04/2021   Hyperthyroidism    Atrial fibrillation (HCC) 07/29/2017    PCP: Cherre Cornish, NP  REFERRING PROVIDER: Earnie Gola, PA-C    REFERRING DIAG: ICD-10: M25.561: Pain in right knee  THERAPY DIAG:  Stiffness of right knee, not elsewhere classified  Other abnormalities of gait and mobility  Muscle weakness (generalized)  Acute pain of right knee  Localized edema  RATIONALE FOR EVALUATION AND TREATMENT: Rehabilitation  ONSET DATE: 09/13/2023  NEXT MD VISIT: 12/03/23   SUBJECTIVE:   SUBJECTIVE STATEMENT: Pt reports stiffness but no pain.   PERTINENT HISTORY: HTN, DM, A-Fib, Melanoma, Neuromuscular disorder, GERD  PAIN:  Are you having pain? Yes: NPRS scale: 0/10  Pain location: R knee Pain description: stiffness Aggravating factors: movement - trying to bend knee  Relieving factors: ice machine, meds  PRECAUTIONS: None  RED FLAGS: None   WEIGHT BEARING RESTRICTIONS: No  FALLS:  Has patient fallen in last 6 months? 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  DIAGNOSTIC FINDINGS: N/A  PATIENT SURVEYS:  Lower Extremity Functional Score: 12 / 80 = 15.0 % 10/23/23: 63 / 80 = 78.8 %  COGNITION: Overall cognitive status: Within functional limits for tasks assessed     SENSATION: WFL Mild  neuropathy in B feet  EDEMA:  Circumferential:    R = 49.5 cm (6 cm above patella), 45.5 cm (at patella) 42.0 cm (6 cm below patella) L = R = 45.0 cm (6 cm above patella), 41.5 cm (at patella) 34.0 cm (6 cm below patella)  PALPATION: TTP  LOWER EXTREMITY ROM:  A/PROM Right eval Left eval R 09/21/23 R 09/28/23 10/01/23 10/04/23 10/08/23 10/15/23 10/23/23 10/29/23 11/01/23  Knee flexion 60 / 62 109 72 77 79 80 / 82 96 99 103 106 107  Knee extension 15 / 7 0 5 8 / 3 4  1 2 2 2     (Blank rows = not tested)  LOWER EXTREMITY MMT:  MMT Right eval Left eval R 10/23/23 L 10/23/23  Hip flexion 3+ 4 5 5   Hip extension 4- 4 4+ 4+  Hip abduction 4- 4 4+ 4+  Hip adduction 3+ p! 4 4+ 4+  Hip internal rotation   4+ 4+  Hip external rotation   4 4  Knee flexion 4- 4 4+ 4+  Knee extension 3+ p! 4 4+ 4+  Ankle dorsiflexion   4+ 5  Ankle plantarflexion   4 (10 SLS HR) 5 (20 SLS HR)  Ankle inversion      Ankle eversion       (Blank rows = not tested)  LOWER EXTREMITY SPECIAL TESTS:  N/A  FUNCTIONAL TESTS:  Timed up and go (TUG): 29.56 sec with RW   GAIT: Distance walked: clinic distances Assistive device utilized: Environmental consultant - 2 wheeled Level of assistance: Modified independence Gait pattern: step to pattern, decreased step length- Right, decreased stance time- Right, and decreased hip/knee flexion- Right Comments: Performed TUG w/ pt: 29.56s                                                                                                                               TREATMENT DATE:   11/01/2023  THERAPEUTIC EXERCISE: To improve strength, endurance, ROM, and flexibility.  Demonstration, verbal and tactile cues throughout for technique.  NuStep - L6 x 7 min  THERAPEUTIC ACTIVITIES: To improve functional performance.  Demonstration, verbal and tactile cues throughout for technique. Alt toe clears to 6" step x 20 - focusing on increasing hip and knee flexion for stair clearance R Fwd step-up to 6" step x  10 R Fwd step up and over 6" step x 10 R Lateral eccentric step down from  6" step with light toe tap to floor x 10 R Forward eccentric step down from 6" step with light toe tap to floor x 10 R Forward eccentric step down from 6" step with light toe tap to floor + blue TB TKE x 10  NEUROMUSCULAR RE-EDUCATION: To improve balance, coordination, kinesthesia, and amplitude of movement. B side-stepping with looped GTB at distal thighs x 10 ft B side-stepping with looped GTB at ankles x 10 ft Fwd/back monster walk with with looped GTB at ankles 2 x 10 ft R blue TB TKE 2 x 10  MANUAL THERAPY: To promote normalized muscle tension, improved flexibility, improved joint mobility, and increased ROM utilizing joint mobilization and MET. Seated R knee distraction + grade II-III tibiofemoral A/P mobs to increase L knee flexion  Seated contract/relax into R knee flexion  MODALITIES:  Game Ready vasopneumatic compression post session to R knee x 10 min, high compression, 34 to reduce post-exercise pain and swelling/edema   10/29/2023 THERAPEUTIC EXERCISE: To improve strength, endurance, ROM, and flexibility.  Demonstration, verbal and tactile cues throughout for technique.  Bike Partial revolutions x 6 min Knee flexion 20lb 2x10 BLE Knee extension 10lb 2x10 BLE  THERAPEUTIC ACTIVITIES: To improve functional performance.  Demonstration, verbal and tactile cues throughout for technique. Step up and over 4' step focusing on R eccentric control 2 x 10 8' fwd step ups RLE x 10 Mod single leg squat 2 x 10 with counter support Sidesteps RTB at ankles 4x15 ft  MANUAL THERAPY: Knee flexion PROM into end range seated and prone Contract + relax for knee flexion 3x5" AA knee flexion with therapist assist x 10 in sitting  MODALITIES: Game Ready vasopneumatic compression post session to R knee x 10 min, high compression, 34 to reduce post-exercise pain and swelling/edema   10/25/2023 THERAPEUTIC EXERCISE: To  improve strength, endurance, ROM, and flexibility.  Demonstration, verbal and tactile cues throughout for technique.  Bike Partial revolutions x 6 min R runner stretch 2x30"  THERAPEUTIC ACTIVITIES: To improve functional performance.  Demonstration, verbal and tactile cues throughout for technique. Step up and over 4' step focusing on R eccentric control x 10 6' fwd step ups RLE 2x10 6' lateral step ups RLE 2x10 Mini single leg squat x 10  MANUAL THERAPY: To promote improved flexibility, improved joint mobility, increased ROM, and reduced pain utilizing joint mobilization, connective tissue massage, and scar mobilization.  STM to R medial gastroc head Passive gastroc stretch 2x15" Seated R knee distraction + grade II-III tibiofemoral A/P mobs to increase L knee flexion  Supine R knee grade II-III tibiofemoral A/P mobs to increase R knee flexion  Supine R knee grade II-III femorotibial A/P mobs + longitudinal distraction to increase R knee extension   MODALITIES: Game Ready vasopneumatic compression post session to R knee x 10 min, high compression, 34 to reduce post-exercise pain and swelling/edema   PATIENT EDUCATION:  Education details: HEP review and HEP progression Person educated: Patient Education method: Explanation, Demonstration, Verbal cues, Handouts, and MedBridgeGO app access provided Education comprehension: verbalized understanding, returned demonstration, and verbal cues required  HOME EXERCISE PROGRAM:  Access Code: [email or web address] URL: [email or web address] Date: 11/01/2023 Prepared by: Felecia Hopper  Exercises - Long Sitting 4 Way Patellar Glide  - 2-3 x daily - 7 x weekly - 2 sets - 10 reps - Prone Hip Flexor & Quad Stretch with Strap  - 1-2 x daily - 7 x weekly - 3 reps - 30 sec hold - Gastroc Stretch on  Wall  - 1 x daily - 7 x weekly - 3 sets - 3 reps - 30 seconds hold - Standing ITB Stretch  - 1 x daily - 5-7 x weekly - 2 sets - 30 hold - Standing  Knee Flexion Stretch on Step  - 1 x daily - 7 x weekly - 2 sets - 10 reps - 5 second hold - Prone Terminal Knee Extension  - 1 x daily - 7 x weekly - 2 sets - 10 reps - 5 secons hold - Clam with Resistance  - 1 x daily - 3 x weekly - 2 sets - 10 reps - 3-5 sec hold - Side Stepping with Resistance at Ankles  - 1 x daily - 3 x weekly - 2 sets - 10 reps - Band Walks  - 1 x daily - 3 x weekly - 2 sets - 10 reps - Standing Terminal Knee Extension with Resistance  - 1 x daily - 3 x weekly - 2 sets - 10 reps - 5 sec hold   ASSESSMENT:  CLINICAL IMPRESSION: Jean Hopkins denies pain, reporting only stiffness this morning.  She reports she has been working on Psychologist, counselling at work (no stairs at home) but still feels that this is challenging.  Therapeutic activities focusing on functional progression with stair negotiation, working initially on hip and knee flexion w/o hip circumduction for step clearance, progression to concentric lift for stair ascent and then control with eccentric lowering for stair descent.  HEP reviewed and updated with progression of hip and quad strengthening to address ongoing weakness and functional limitations.  Jean Hopkins will benefit from continued skilled PT to address ongoing stiffness, ROM, and strength deficits to improve mobility and activity intolerance.  She has 2 visits remaining in her current POC and will hopefully be ready to transition to her HEP as of her last visit.  OBJECTIVE IMPAIRMENTS: Abnormal gait, decreased activity tolerance, decreased endurance, decreased knowledge of use of DME, decreased mobility, difficulty walking, decreased ROM, decreased strength, increased edema, impaired perceived functional ability, impaired flexibility, improper body mechanics, postural dysfunction, and pain.   ACTIVITY LIMITATIONS: carrying, lifting, bending, standing, squatting, stairs, transfers, bed mobility, bathing, toileting, dressing, and locomotion level  PARTICIPATION  LIMITATIONS: cleaning, laundry, driving, community activity, and occupation  PERSONAL FACTORS: Age, Fitness, and 3+ comorbidities: HTN, DM, A-Fib, Melanoma, Neuromuscular disorder, GERD are also affecting patient's functional outcome.   REHAB POTENTIAL: Excellent  CLINICAL DECISION MAKING: Evolving/moderate complexity  EVALUATION COMPLEXITY: Moderate   GOALS: Goals reviewed with patient? Yes  SHORT TERM GOALS: Target date: 10/15/2023  Pt will be independent w/ HEP Baseline: Goal status: MET - 10/08/23   2.  Pt will have improve R knee ROM to -7 to 90 to normalize gait pattern Baseline: R knee AROM 15 to 60 & PROM 7 to 62 Goal status: MET - 10/08/23 - R knee AROM 1-96  3.  Pt will be able to perform TUG <20 sec w/ AD Baseline: 29.56 sec with RW  Goal status: MET - 10/08/23 - 10.18 sec w/o AD   LONG TERM GOALS: Target date: 11/12/2023  Pt will be able to improve LEFS score to 40/80 to demonstrate functional ability Baseline:  LEFS 12 / 80 = 15.0 % Goal status: MET - 10/23/23 - 63 / 80 = 78.8 %  2.  Pt will report at least 60% improvement in R knee pain to improve QoL Baseline: 0/10 on eval, on average 5/10 Goal status: MET - 10/23/23 - minimal  to no pain, just stiffness  3.  Pt will be able to perform decreased TUG time in <=13.5 sec to decrease fall risk Baseline: 29.56 sec with RW  Goal status: MET - 10/23/23 - 9.25 sec  4.  Pt will improve to improve overall R LE strength 4+/5 to improve stability and mobility Baseline: refer above to MMT table Goal status: IN PROGRESS - 10/23/23 - met except B hip ER and R ankle PF 4/5  5.  Pt will improve R knee AROM 0 to 105 to normalize gait pattern Baseline: R knee AROM 15 to 60 & PROM 7 to 62 10/23/23 - R knee AROM 2-103 Goal status: PARTIALLY MET - 10/29/23 - 2-106 - met for flexion ROM  6.  Pt will be able go up a flight stairs with alternating gait pattern w/ UE support w/ SPC or single hand rail to increase independence in  community access Baseline: Not tested Goal status: IN PROGRESS - 10/23/23 - difficulty with adequate R hip and knee flexion resulting in slight hip circumduction on reciprocal ascent, limited R knee flexion and eccentric quad control resulting in drop off step on reciprocal descent  7.  Pt will be able to implement step through gait pattern to safely ambulate in community w/ LRAD Baseline: Step to gait pattern w/ flexed posture using RW Goal status: MET - 10/23/23    PLAN:  PT FREQUENCY: 3x/week - 3x/wk x 2 weeks, tapering to 2x a week for duration of POC  PT DURATION: 8 weeks  PLANNED INTERVENTIONS: 97164- PT Re-evaluation, 97110-Therapeutic exercises, 97530- Therapeutic activity, 97112- Neuromuscular re-education, 97535- Self Care, 13244- Manual therapy, 97116- Gait training, [phone]- Electrical stimulation (manual), 97016- Vasopneumatic device, 97033- Ionotophoresis 4mg /ml Dexamethasone , Patient/Family education, Balance training, Stair training, Taping, Dry Needling, Joint mobilization, Scar mobilization, DME instructions, Cryotherapy, Moist heat, and 97750 - Physical Performance Test or Measurement  PLAN FOR NEXT SESSION:   improve proximal LE flexibility & R knee ROM, joint mobs, LE strengthening with emphasis on eccentric quad control, review and continue to progress HEP as indicated, gait training working toward normal reciprocal stair negotiation; at least weekly ROM measurements   Francisco Irving, PT 11/01/2023, 9:05 AM

## 2023-11-05 ENCOUNTER — Ambulatory Visit

## 2023-11-05 DIAGNOSIS — M6281 Muscle weakness (generalized): Secondary | ICD-10-CM

## 2023-11-05 DIAGNOSIS — M25661 Stiffness of right knee, not elsewhere classified: Secondary | ICD-10-CM | POA: Diagnosis not present

## 2023-11-05 DIAGNOSIS — R2689 Other abnormalities of gait and mobility: Secondary | ICD-10-CM | POA: Diagnosis not present

## 2023-11-05 DIAGNOSIS — M25561 Pain in right knee: Secondary | ICD-10-CM

## 2023-11-05 DIAGNOSIS — R600 Localized edema: Secondary | ICD-10-CM | POA: Diagnosis not present

## 2023-11-05 NOTE — Therapy (Signed)
 OUTPATIENT PHYSICAL THERAPY TREATMENT    Patient Name: SILENA CORTEZ MRN: [id number] DOB:[birth year], 68 y.o., female Today's Date: 11/05/2023  END OF SESSION:           Past Medical History:  Diagnosis Date   AF (atrial fibrillation) (HCC)    hyperthyroid induced   Allergy    Anemia    Arthritis 11/2020   Right knee   Blood transfusion without reported diagnosis    Cancer (HCC)    Melanoma x2   Cataract    Complication of anesthesia    Slow to wake up with general anesthesia   Controlled type 2 diabetes mellitus without complication, without long-term current use of insulin (HCC) 02/23/2022   no meds   GERD (gastroesophageal reflux disease)    Hypercholesterolemia 08/25/2022   Hypertension    Hyperthyroidism    Neuromuscular disorder (HCC)    neurpathy feet   Pneumonia    Past Surgical History:  Procedure Laterality Date   ABDOMINAL HYSTERECTOMY  2007   CHOLECYSTECTOMY  1988   TOTAL ABDOMINAL HYSTERECTOMY     TOTAL KNEE ARTHROPLASTY Right 09/13/2023   Procedure: ARTHROPLASTY, KNEE, TOTAL;  Surgeon: Claiborne Crew, MD;  Location: [facility];  Service: Orthopedics;  Laterality: Right;   Patient Active Problem List   Diagnosis Date Noted   S/P total knee arthroplasty, right 09/13/2023   Postmenopausal 08/15/2023   Hypercholesterolemia 08/25/2022   Personal history of malignant melanoma of skin 08/25/2022   Vitamin D  deficiency 08/25/2022   Controlled type 2 diabetes mellitus without complication, without long-term current use of insulin (HCC) 02/23/2022   Hypertension 02/02/2021   Melanoma of skin (HCC) 02/02/2021   Raynaud's phenomenon 02/02/2021   Gastroesophageal reflux disease without esophagitis 01/14/2021   Osteoarthritis of right knee 01/04/2021   Hyperthyroidism    Atrial fibrillation (HCC) 07/29/2017    PCP: Cherre Cornish, NP  REFERRING PROVIDER: Earnie Gola, PA-C    REFERRING DIAG: ICD-10: M25.561: Pain in right knee  THERAPY DIAG:   Stiffness of right knee, not elsewhere classified  Other abnormalities of gait and mobility  Muscle weakness (generalized)  Acute pain of right knee  Localized edema  RATIONALE FOR EVALUATION AND TREATMENT: Rehabilitation  ONSET DATE: 09/13/2023  NEXT MD VISIT: 12/03/23   SUBJECTIVE:   SUBJECTIVE STATEMENT: Pt reports no pain.  PERTINENT HISTORY: HTN, DM, A-Fib, Melanoma, Neuromuscular disorder, GERD  PAIN:  Are you having pain? Yes: NPRS scale: 0/10  Pain location: R knee Pain description: stiffness Aggravating factors: movement - trying to bend knee  Relieving factors: ice machine, meds  PRECAUTIONS: None  RED FLAGS: None   WEIGHT BEARING RESTRICTIONS: No  FALLS:  Has patient fallen in last 6 months? 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  DIAGNOSTIC FINDINGS: N/A  PATIENT SURVEYS:  Lower Extremity Functional Score: 12 / 80 = 15.0 % 10/23/23: 63 / 80 = 78.8 %  COGNITION: Overall cognitive status: Within functional limits for tasks assessed  SENSATION: WFL Mild neuropathy in B feet  EDEMA:  Circumferential:    R = 49.5 cm (6 cm above patella), 45.5 cm (at patella) 42.0 cm (6 cm below patella) L = R = 45.0 cm (6 cm above patella), 41.5 cm (at patella) 34.0 cm (6 cm below patella)  PALPATION: TTP  LOWER EXTREMITY ROM:  A/PROM Right eval Left eval R 09/21/23 R 09/28/23 10/01/23 10/04/23 10/08/23  10/15/23 10/23/23 10/29/23 11/01/23  Knee flexion 60 / 62 109 72 77 79 80 / 82 96 99 103 106 107  Knee extension 15 / 7 0 5 8 / 3 4  1 2 2 2     (Blank rows = not tested)  LOWER EXTREMITY MMT:  MMT Right eval Left eval R 10/23/23 L 10/23/23  Hip flexion 3+ 4 5 5   Hip extension 4- 4 4+ 4+  Hip abduction 4- 4 4+ 4+  Hip adduction 3+ p! 4 4+ 4+  Hip internal rotation   4+ 4+  Hip external rotation   4 4  Knee flexion 4- 4 4+ 4+  Knee extension 3+ p! 4 4+ 4+  Ankle dorsiflexion   4+ 5  Ankle plantarflexion   4 (10 SLS HR) 5 (20 SLS HR)  Ankle inversion      Ankle eversion       (Blank rows = not tested)  LOWER EXTREMITY SPECIAL TESTS:  N/A  FUNCTIONAL TESTS:  Timed up and go (TUG): 29.56 sec with RW   GAIT: Distance walked: clinic distances Assistive device utilized: Environmental consultant - 2 wheeled Level of assistance: Modified independence Gait pattern: step to pattern, decreased step length- Right, decreased stance time- Right, and decreased hip/knee flexion- Right Comments: Performed TUG w/ pt: 29.56s                                                                                                                               TREATMENT DATE:  11/05/23 THERAPEUTIC EXERCISE: To improve strength, endurance, ROM, and flexibility.  Bike x 6 min partial rev Leg curls 35lb 2x10 BLE Leg extension 20lb 2x10 BLE THERAPEUTIC ACTIVITIES: To improve functional performance.  R lateral step ups  RLE 2x10 R step up and over 6' step 2 x 10 Leg press 35lb 2x10 BLE; 15lb x 15 RLE NEUROMUSCULAR RE-EDUCATION: To improve balance, coordination, kinesthesia, and amplitude of movement. Marching on airex pad x 10  Clock reach from ariex 12 to 6o'clock x 5 LLE Modified SLS RLE 3 trials  MODALITIES:  Game Ready vasopneumatic compression post session to R knee x 10 min, high compression, 34 to reduce post-exercise pain and swelling/edema 11/01/2023  THERAPEUTIC EXERCISE: To improve strength, endurance, ROM, and  flexibility.  Demonstration, verbal and tactile cues throughout for technique.  NuStep - L6 x 7 min  THERAPEUTIC ACTIVITIES: To improve functional performance.  Demonstration, verbal and tactile cues throughout for technique. Alt toe clears to 6" step x 20 - focusing on increasing  hip and knee flexion for stair clearance R Fwd step-up to 6" step x 10 R Fwd step up and over 6" step x 10 R Lateral eccentric step down from 6" step with light toe tap to floor x 10 R Forward eccentric step down from 6" step with light toe tap to floor x 10 R Forward eccentric step down from 6" step with light toe tap to floor + blue TB TKE x 10  NEUROMUSCULAR RE-EDUCATION: To improve balance, coordination, kinesthesia, and amplitude of movement. B side-stepping with looped GTB at distal thighs x 10 ft B side-stepping with looped GTB at ankles x 10 ft Fwd/back monster walk with with looped GTB at ankles 2 x 10 ft R blue TB TKE 2 x 10  MANUAL THERAPY: To promote normalized muscle tension, improved flexibility, improved joint mobility, and increased ROM utilizing joint mobilization and MET. Seated R knee distraction + grade II-III tibiofemoral A/P mobs to increase L knee flexion  Seated contract/relax into R knee flexion  MODALITIES:  Game Ready vasopneumatic compression post session to R knee x 10 min, high compression, 34 to reduce post-exercise pain and swelling/edema   10/29/2023 THERAPEUTIC EXERCISE: To improve strength, endurance, ROM, and flexibility.  Demonstration, verbal and tactile cues throughout for technique.  Bike Partial revolutions x 6 min Knee flexion 20lb 2x10 BLE Knee extension 10lb 2x10 BLE  THERAPEUTIC ACTIVITIES: To improve functional performance.  Demonstration, verbal and tactile cues throughout for technique. Step up and over 4' step focusing on R eccentric control 2 x 10 8' fwd step ups RLE x 10 Mod single leg squat 2 x 10 with counter support Sidesteps RTB at ankles 4x15  ft  MANUAL THERAPY: Knee flexion PROM into end range seated and prone Contract + relax for knee flexion 3x5" AA knee flexion with therapist assist x 10 in sitting  MODALITIES: Game Ready vasopneumatic compression post session to R knee x 10 min, high compression, 34 to reduce post-exercise pain and swelling/edema   10/25/2023 THERAPEUTIC EXERCISE: To improve strength, endurance, ROM, and flexibility.  Demonstration, verbal and tactile cues throughout for technique.  Bike Partial revolutions x 6 min R runner stretch 2x30"  THERAPEUTIC ACTIVITIES: To improve functional performance.  Demonstration, verbal and tactile cues throughout for technique. Step up and over 4' step focusing on R eccentric control x 10 6' fwd step ups RLE 2x10 6' lateral step ups RLE 2x10 Mini single leg squat x 10  MANUAL THERAPY: To promote improved flexibility, improved joint mobility, increased ROM, and reduced pain utilizing joint mobilization, connective tissue massage, and scar mobilization.  STM to R medial gastroc head Passive gastroc stretch 2x15" Seated R knee distraction + grade II-III tibiofemoral A/P mobs to increase L knee flexion  Supine R knee grade II-III tibiofemoral A/P mobs to increase R knee flexion  Supine R knee grade II-III femorotibial A/P mobs + longitudinal distraction to increase R knee extension   MODALITIES: Game Ready vasopneumatic compression post session to R knee x 10 min, high compression, 34 to reduce post-exercise pain and swelling/edema   PATIENT EDUCATION:  Education details: HEP review and HEP progression Person educated: Patient Education method: Explanation, Demonstration, Verbal cues, Handouts, and MedBridgeGO app access provided Education comprehension: verbalized understanding, returned demonstration, and verbal cues required  HOME EXERCISE PROGRAM:  Access Code: [email or web address] URL: [email or web address] Date: 11/01/2023 Prepared by: Felecia Hopper  Exercises - Long Sitting 4 Way Patellar Glide  - 2-3 x daily - 7 x weekly - 2 sets -  10 reps - Prone Hip Flexor & Quad Stretch with Strap  - 1-2 x daily - 7 x weekly - 3 reps - 30 sec hold - Gastroc Stretch on Wall  - 1 x daily - 7 x weekly - 3 sets - 3 reps - 30 seconds hold - Standing ITB Stretch  - 1 x daily - 5-7 x weekly - 2 sets - 30 hold - Standing Knee Flexion Stretch on Step  - 1 x daily - 7 x weekly - 2 sets - 10 reps - 5 second hold - Prone Terminal Knee Extension  - 1 x daily - 7 x weekly - 2 sets - 10 reps - 5 secons hold - Clam with Resistance  - 1 x daily - 3 x weekly - 2 sets - 10 reps - 3-5 sec hold - Side Stepping with Resistance at Ankles  - 1 x daily - 3 x weekly - 2 sets - 10 reps - Band Walks  - 1 x daily - 3 x weekly - 2 sets - 10 reps - Standing Terminal Knee Extension with Resistance  - 1 x daily - 3 x weekly - 2 sets - 10 reps - 5 sec hold   ASSESSMENT:  CLINICAL IMPRESSION: Progressed through functional stair training, balance, and strengthening. Today she shows balance deficits while being supported on her R LE only. Cues required with step ups to avoid hip dropping. Vaso post session to address swelling.  She has 1 visit remaining in her current POC and will hopefully be ready to transition to her HEP as of her last visit.  OBJECTIVE IMPAIRMENTS: Abnormal gait, decreased activity tolerance, decreased endurance, decreased knowledge of use of DME, decreased mobility, difficulty walking, decreased ROM, decreased strength, increased edema, impaired perceived functional ability, impaired flexibility, improper body mechanics, postural dysfunction, and pain.   ACTIVITY LIMITATIONS: carrying, lifting, bending, standing, squatting, stairs, transfers, bed mobility, bathing, toileting, dressing, and locomotion level  PARTICIPATION LIMITATIONS: cleaning, laundry, driving, community activity, and occupation  PERSONAL FACTORS: Age, Fitness, and 3+ comorbidities: HTN,  DM, A-Fib, Melanoma, Neuromuscular disorder, GERD are also affecting patient's functional outcome.   REHAB POTENTIAL: Excellent  CLINICAL DECISION MAKING: Evolving/moderate complexity  EVALUATION COMPLEXITY: Moderate   GOALS: Goals reviewed with patient? Yes  SHORT TERM GOALS: Target date: 10/15/2023  Pt will be independent w/ HEP Baseline: Goal status: MET - 10/08/23   2.  Pt will have improve R knee ROM to -7 to 90 to normalize gait pattern Baseline: R knee AROM 15 to 60 & PROM 7 to 62 Goal status: MET - 10/08/23 - R knee AROM 1-96  3.  Pt will be able to perform TUG <20 sec w/ AD Baseline: 29.56 sec with RW  Goal status: MET - 10/08/23 - 10.18 sec w/o AD   LONG TERM GOALS: Target date: 11/12/2023  Pt will be able to improve LEFS score to 40/80 to demonstrate functional ability Baseline:  LEFS 12 / 80 = 15.0 % Goal status: MET - 10/23/23 - 63 / 80 = 78.8 %  2.  Pt will report at least 60% improvement in R knee pain to improve QoL Baseline: 0/10 on eval, on average 5/10 Goal status: MET - 10/23/23 - minimal to no pain, just stiffness  3.  Pt will be able to perform decreased TUG time in <=13.5 sec to decrease fall risk Baseline: 29.56 sec with RW  Goal status: MET - 10/23/23 - 9.25 sec  4.  Pt will improve to  improve overall R LE strength 4+/5 to improve stability and mobility Baseline: refer above to MMT table Goal status: IN PROGRESS - 10/23/23 - met except B hip ER and R ankle PF 4/5  5.  Pt will improve R knee AROM 0 to 105 to normalize gait pattern Baseline: R knee AROM 15 to 60 & PROM 7 to 62 10/23/23 - R knee AROM 2-103 Goal status: PARTIALLY MET - 10/29/23 - 2-106 - met for flexion ROM  6.  Pt will be able go up a flight stairs with alternating gait pattern w/ UE support w/ SPC or single hand rail to increase independence in community access Baseline: Not tested Goal status: IN PROGRESS - 10/23/23 - difficulty with adequate R hip and knee flexion resulting in  slight hip circumduction on reciprocal ascent, limited R knee flexion and eccentric quad control resulting in drop off step on reciprocal descent  7.  Pt will be able to implement step through gait pattern to safely ambulate in community w/ LRAD Baseline: Step to gait pattern w/ flexed posture using RW Goal status: MET - 10/23/23    PLAN:  PT FREQUENCY: 3x/week - 3x/wk x 2 weeks, tapering to 2x a week for duration of POC  PT DURATION: 8 weeks  PLANNED INTERVENTIONS: 97164- PT Re-evaluation, 97110-Therapeutic exercises, 97530- Therapeutic activity, 97112- Neuromuscular re-education, 97535- Self Care, [id number]- Manual therapy, 97116- Gait training, [phone]- Electrical stimulation (manual), 97016- Vasopneumatic device, 97033- Ionotophoresis 4mg /ml Dexamethasone , Patient/Family education, Balance training, Stair training, Taping, Dry Needling, Joint mobilization, Scar mobilization, DME instructions, Cryotherapy, Moist heat, and 97750 - Physical Performance Test or Measurement  PLAN FOR NEXT SESSION:   improve proximal LE flexibility & R knee ROM, joint mobs, LE strengthening with emphasis on eccentric quad control, review and continue to progress HEP as indicated, gait training working toward normal reciprocal stair negotiation; at least weekly ROM measurements   Trinity Hyland L Jacobb Alen, PTA 11/05/2023, 8:04 AM

## 2023-11-08 ENCOUNTER — Encounter: Payer: Self-pay | Admitting: Physical Therapy

## 2023-11-08 ENCOUNTER — Ambulatory Visit: Admitting: Physical Therapy

## 2023-11-08 DIAGNOSIS — R2689 Other abnormalities of gait and mobility: Secondary | ICD-10-CM

## 2023-11-08 DIAGNOSIS — R600 Localized edema: Secondary | ICD-10-CM

## 2023-11-08 DIAGNOSIS — M25661 Stiffness of right knee, not elsewhere classified: Secondary | ICD-10-CM

## 2023-11-08 DIAGNOSIS — M6281 Muscle weakness (generalized): Secondary | ICD-10-CM

## 2023-11-08 DIAGNOSIS — M25561 Pain in right knee: Secondary | ICD-10-CM

## 2023-11-08 NOTE — Therapy (Signed)
 OUTPATIENT PHYSICAL THERAPY TREATMENT / DISCHARGE SUMMARY    Patient Name: Jean Hopkins MRN: [id number] DOB:1955-10-01, 68 y.o., female Today's Date: 11/08/2023  END OF SESSION:  PT End of Session - 11/08/23 0803     Visit Number 18    Date for PT Re-Evaluation 11/12/23    Authorization Type BCBS    PT Start Time 0803    PT Stop Time 0858    PT Time Calculation (min) 55 min    Activity Tolerance Patient tolerated treatment well    Behavior During Therapy [facility] for tasks assessed/performed                     Past Medical History:  Diagnosis Date   AF (atrial fibrillation) (HCC)    hyperthyroid induced   Allergy    Anemia    Arthritis 11/2020   Right knee   Blood transfusion without reported diagnosis    Cancer (HCC)    Melanoma x2   Cataract    Complication of anesthesia    Slow to wake up with general anesthesia   Controlled type 2 diabetes mellitus without complication, without long-term current use of insulin (HCC) 02/23/2022   no meds   GERD (gastroesophageal reflux disease)    Hypercholesterolemia 08/25/2022   Hypertension    Hyperthyroidism    Neuromuscular disorder (HCC)    neurpathy feet   Pneumonia    Past Surgical History:  Procedure Laterality Date   ABDOMINAL HYSTERECTOMY  2007   CHOLECYSTECTOMY  1988   TOTAL ABDOMINAL HYSTERECTOMY     TOTAL KNEE ARTHROPLASTY Right 09/13/2023   Procedure: ARTHROPLASTY, KNEE, TOTAL;  Surgeon: Claiborne Crew, MD;  Location: [facility];  Service: Orthopedics;  Laterality: Right;   Patient Active Problem List   Diagnosis Date Noted   S/P total knee arthroplasty, right 09/13/2023   Postmenopausal 08/15/2023   Hypercholesterolemia 08/25/2022   Personal history of malignant melanoma of skin 08/25/2022   Vitamin D  deficiency 08/25/2022   Controlled type 2 diabetes mellitus without complication, without long-term current use of insulin (HCC) 02/23/2022   Hypertension 02/02/2021   Melanoma of skin (HCC) 02/02/2021    Raynaud's phenomenon 02/02/2021   Gastroesophageal reflux disease without esophagitis 01/14/2021   Osteoarthritis of right knee 01/04/2021   Hyperthyroidism    Atrial fibrillation (HCC) 07/29/2017    PCP: Cherre Cornish, NP  REFERRING PROVIDER: Earnie Gola, PA-C    REFERRING DIAG: ICD-10: M25.561: Pain in right knee  THERAPY DIAG:  Stiffness of right knee, not elsewhere classified  Other abnormalities of gait and mobility  Muscle weakness (generalized)  Acute pain of right knee  Localized edema  RATIONALE FOR EVALUATION AND TREATMENT: Rehabilitation  ONSET DATE: 09/13/2023  NEXT MD VISIT: 12/03/23   SUBJECTIVE:   SUBJECTIVE STATEMENT: Pt reports she has been working on the steps and feels like this is getting better but realizes things will still take time.  PERTINENT HISTORY: HTN, DM, A-Fib, Melanoma, Neuromuscular disorder, GERD  PAIN:  Are you having pain? No  PRECAUTIONS: None  RED FLAGS: None   WEIGHT BEARING RESTRICTIONS: No  FALLS:  Has patient fallen in last 6 months? 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  DIAGNOSTIC FINDINGS: N/A  PATIENT SURVEYS:  Lower Extremity Functional Score: 12 / 80 = 15.0 % 10/23/23: 63 / 80 = 78.8 %  COGNITION: Overall cognitive status: Within functional limits for tasks assessed     SENSATION: WFL Mild neuropathy in B feet  EDEMA:  Circumferential:    R  = 49.5 cm (6 cm above patella), 45.5 cm (at patella) 42.0 cm (6 cm below patella) L = R = 45.0 cm (6 cm above patella), 41.5 cm (at patella) 34.0 cm (6 cm below patella)  PALPATION: TTP  LOWER EXTREMITY ROM:  A/PROM Right eval Left eval R 09/21/23 R 09/28/23 10/01/23 10/04/23 10/08/23 10/15/23 10/23/23 10/29/23 11/01/23 R 11/08/23  Knee flexion 60 / 62 109 72 77 79 80 / 82 96 99 103 106 107 104  Knee extension 15 / 7 0 5 8 / 3 4  1 2 2 2  7  / 2   (Blank rows = not tested)  LOWER EXTREMITY MMT:  MMT Right eval Left eval R 10/23/23 L 10/23/23 R 11/08/23 L 11/08/23  Hip flexion 3+ 4 5 5 5 5   Hip extension 4- 4 4+ 4+ 5 5  Hip abduction 4- 4 4+ 4+ 5 4+  Hip adduction 3+ p! 4 4+ 4+ 5 5  Hip internal rotation   4+ 4+ 5 5  Hip external rotation   4 4 4+ 5  Knee flexion 4- 4 4+ 4+ 5 5  Knee extension 3+ p! 4 4+ 4+ 5 5  Ankle dorsiflexion   4+ 5 5 5   Ankle plantarflexion   4 (10 SLS HR) 5 (20 SLS HR) 5 5  Ankle inversion        Ankle eversion         (Blank rows = not tested)  LOWER EXTREMITY SPECIAL TESTS:  N/A  FUNCTIONAL TESTS:  Timed up and go (TUG): 29.56 sec with RW   GAIT: Distance walked: clinic distances Assistive device utilized: Environmental consultant - 2 wheeled Level of assistance: Modified independence Gait pattern: step to pattern, decreased step length- Right, decreased stance time- Right, and decreased hip/knee flexion- Right Comments: Performed TUG w/ pt: 29.56s                                                                                                                               TREATMENT DATE:   11/08/23 THERAPEUTIC EXERCISE: To improve strength, endurance, ROM, and flexibility.  Demonstration, verbal and tactile cues throughout for technique.  NuStep - L6 x 7 min HEP review Supine R HS + gastroc stretch with strap 2 x 30" Seated R HS + gastroc stretch with strap 2 x 30" - pt preferred  GAIT TRAINING: To normalize gait  pattern. Community distances with normal gait pattern w/o  AD Stairs: Level of Assistance: Modified independence Stair Negotiation Technique: Alternating Pattern  Forwards with Single Rail on Right Number of Stairs: 14  Height of Stairs: 7"  Comments: good reciprocal pattern with only very slight favoring of R knee  THERAPEUTIC ACTIVITIES: To improve functional performance.  Demonstration, verbal and tactile cues throughout for technique.  ROM and MMT assessment Goal assessment  MANUAL THERAPY: To promote improved joint mobility and increased ROM utilizing joint mobilization. Supine R femorotibial A/P mobs with R LE longitudinal distraction to increase R knee extension ROM Hooklying inferior patellar glide + tibiofemoral A/P mobs to increase R knee flexion ROM   SELF CARE: Provided education to prevent loss of gains achieved with physical therapy and to prevent future decline in function.  Provided instruction in self-STM techniques to R quads, HS and gastroc using rolling pin.    MODALITIES: Game Ready vasopneumatic compression post session to R knee x 10 min, high compression, 34 to reduce post-exercise pain and swelling/edema   11/05/23 THERAPEUTIC EXERCISE: To improve strength, endurance, ROM, and flexibility.  Bike x 6 min partial rev Leg curls 35lb 2x10 BLE Leg extension 20lb 2x10 BLE THERAPEUTIC ACTIVITIES: To improve functional performance.  R lateral step ups  RLE 2x10 R step up and over 6' step 2 x 10 Leg press 35lb 2x10 BLE; 15lb x 15 RLE NEUROMUSCULAR RE-EDUCATION: To improve balance, coordination, kinesthesia, and amplitude of movement. Marching on airex pad x 10  Clock reach from ariex 12 to 6o'clock x 5 LLE Modified SLS RLE 3 trials  MODALITIES:  Game Ready vasopneumatic compression post session to R knee x 10 min, high compression, 34 to reduce post-exercise pain and swelling/edema   11/01/2023  THERAPEUTIC EXERCISE: To improve strength, endurance, ROM, and flexibility.  Demonstration, verbal and tactile cues  throughout for technique.  NuStep - L6 x 7 min  THERAPEUTIC ACTIVITIES: To improve functional performance.  Demonstration, verbal and tactile cues throughout for technique. Alt toe clears to 6" step x 20 - focusing on increasing hip and knee flexion for stair clearance R Fwd step-up to 6" step x 10 R Fwd step up and over 6" step x 10 R Lateral eccentric step down from 6" step with light toe tap to floor x 10 R Forward eccentric step down from 6" step with light toe tap to floor x 10 R Forward eccentric step down from 6" step with light toe tap to floor + blue TB TKE x 10  NEUROMUSCULAR RE-EDUCATION: To improve balance, coordination, kinesthesia, and amplitude of movement. B side-stepping with looped GTB at distal thighs x 10 ft B side-stepping with looped GTB at ankles x 10 ft Fwd/back monster walk with with looped GTB at ankles 2 x 10 ft R blue TB TKE 2 x 10  MANUAL THERAPY: To promote normalized muscle tension, improved flexibility, improved joint mobility, and increased ROM utilizing joint mobilization and MET. Seated R knee distraction + grade II-III tibiofemoral A/P mobs to increase L knee flexion  Seated contract/relax into R knee flexion  MODALITIES:  Game Ready vasopneumatic compression post session to R knee x 10 min, high compression, 34 to reduce post-exercise pain and swelling/edema   10/29/2023 THERAPEUTIC EXERCISE: To improve strength, endurance, ROM, and flexibility.  Demonstration, verbal and tactile cues throughout for technique.  Bike Partial revolutions x 6 min Knee flexion 20lb 2x10 BLE Knee extension 10lb 2x10 BLE  THERAPEUTIC ACTIVITIES: To improve functional performance.  Demonstration,  verbal and tactile cues throughout for technique. Step up and over 4' step focusing on R eccentric control 2 x 10 8' fwd step ups RLE x 10 Mod single leg squat 2 x 10 with counter support Sidesteps RTB at ankles 4x15 ft  MANUAL THERAPY: Knee flexion PROM into end range  seated and prone Contract + relax for knee flexion 3x5" AA knee flexion with therapist assist x 10 in sitting  MODALITIES: Game Ready vasopneumatic compression post session to R knee x 10 min, high compression, 34 to reduce post-exercise pain and swelling/edema   10/25/2023 THERAPEUTIC EXERCISE: To improve strength, endurance, ROM, and flexibility.  Demonstration, verbal and tactile cues throughout for technique.  Bike Partial revolutions x 6 min R runner stretch 2x30"  THERAPEUTIC ACTIVITIES: To improve functional performance.  Demonstration, verbal and tactile cues throughout for technique. Step up and over 4' step focusing on R eccentric control x 10 6' fwd step ups RLE 2x10 6' lateral step ups RLE 2x10 Mini single leg squat x 10  MANUAL THERAPY: To promote improved flexibility, improved joint mobility, increased ROM, and reduced pain utilizing joint mobilization, connective tissue massage, and scar mobilization.  STM to R medial gastroc head Passive gastroc stretch 2x15" Seated R knee distraction + grade II-III tibiofemoral A/P mobs to increase L knee flexion  Supine R knee grade II-III tibiofemoral A/P mobs to increase R knee flexion  Supine R knee grade II-III femorotibial A/P mobs + longitudinal distraction to increase R knee extension   MODALITIES: Game Ready vasopneumatic compression post session to R knee x 10 min, high compression, 34 to reduce post-exercise pain and swelling/edema   PATIENT EDUCATION:  Education details: HEP review, HEP progression, recommended frequency for ongoing HEP at discharge to prevent loss of gains achieved with PT, and self-STM techniques to LE musculature using rolling pin Person educated: Patient Education method: Explanation, Demonstration, Verbal cues, and MedBridgeGO app updated Education comprehension: verbalized understanding and returned demonstration  HOME EXERCISE PROGRAM:  Access Code: [email or web address] URL:  [email or web address] Date: 11/08/2023 Prepared by: Felecia Hopper  Exercises - Long Sitting 4 Way Patellar Glide  - 2-3 x daily - 7 x weekly - 2 sets - 10 reps - Prone Hip Flexor & Quad Stretch with Strap  - 1-2 x daily - 7 x weekly - 3 reps - 30 sec hold - Seated Hamstring Stretch with Strap  - 2-3 x daily - 7 x weekly - 3 reps - 30 sec hold - Gastroc Stretch on Wall  - 1 x daily - 7 x weekly - 3 sets - 3 reps - 30 seconds hold - Standing ITB Stretch  - 1 x daily - 5-7 x weekly - 2 sets - 30 hold - Standing Knee Flexion Stretch on Step  - 1 x daily - 7 x weekly - 2 sets - 10 reps - 5 second hold - Prone Terminal Knee Extension  - 1 x daily - 7 x weekly - 2 sets - 10 reps - 5 secons hold - Clam with Resistance  - 1 x daily - 3 x weekly - 2 sets - 10 reps - 3-5 sec hold - Side Stepping with Resistance at Ankles  - 1 x daily - 3 x weekly - 2 sets - 10 reps - Band Walks  - 1 x daily - 3 x weekly - 2 sets - 10 reps - Standing Terminal Knee Extension with Resistance  - 1 x daily - 3 x weekly - 2 sets -  10 reps - 5 sec hold - Roller Massage Elongated IT Band Release  - 1-2 x daily - 7 x weekly - 1-2 min hold   ASSESSMENT:  CLINICAL IMPRESSION: Jackqueline is pleased with her progress with PT s/p R TKA.  She is now ambulatory w/o AD and able to navigate stairs reciprocally with only slight compensation.  Overall B LE strength is now 4+ to 5/5.  ROM has been gradually improving but was a little stiffer today with AROM 2-104 and 7 lacking in LAQ.  Ongoing posterior chain tightness, evident therefore provided instruction in HS + gastroc stretches as well as self-STM with rolling pin to help promote increased muscle flexibility for increased extension ROM.  Otherwise, patient reporting good comfort with HEP.  Majority of PT goals now met and Raygen feels ready to transition to her HEP, therefore will proceed with discharge from physical therapy for this episode.  OBJECTIVE IMPAIRMENTS: Abnormal  gait, decreased activity tolerance, decreased endurance, decreased knowledge of use of DME, decreased mobility, difficulty walking, decreased ROM, decreased strength, increased edema, impaired perceived functional ability, impaired flexibility, improper body mechanics, postural dysfunction, and pain.   ACTIVITY LIMITATIONS: carrying, lifting, bending, standing, squatting, stairs, transfers, bed mobility, bathing, toileting, dressing, and locomotion level  PARTICIPATION LIMITATIONS: cleaning, laundry, driving, community activity, and occupation  PERSONAL FACTORS: Age, Fitness, and 3+ comorbidities: HTN, DM, A-Fib, Melanoma, Neuromuscular disorder, GERD are also affecting patient's functional outcome.   REHAB POTENTIAL: Excellent  CLINICAL DECISION MAKING: Evolving/moderate complexity  EVALUATION COMPLEXITY: Moderate   GOALS: Goals reviewed with patient? Yes  SHORT TERM GOALS: Target date: 10/15/2023  Pt will be independent w/ HEP Baseline: Goal status: MET - 10/08/23   2.  Pt will have improve R knee ROM to -7 to 90 to normalize gait pattern Baseline: R knee AROM 15 to 60 & PROM 7 to 62 Goal status: MET - 10/08/23 - R knee AROM 1-96  3.  Pt will be able to perform TUG <20 sec w/ AD Baseline: 29.56 sec with RW  Goal status: MET - 10/08/23 - 10.18 sec w/o AD   LONG TERM GOALS: Target date: 11/12/2023  Pt will be able to improve LEFS score to 40/80 to demonstrate functional ability Baseline:  LEFS 12 / 80 = 15.0 % Goal status: MET - 10/23/23 - 63 / 80 = 78.8 %  2.  Pt will report at least 60% improvement in R knee pain to improve QoL Baseline: 0/10 on eval, on average 5/10 Goal status: MET - 10/23/23 - minimal to no pain, just stiffness  3.  Pt will be able to perform decreased TUG time in <=13.5 sec to decrease fall risk Baseline: 29.56 sec with RW  Goal status: MET - 10/23/23 - 9.25 sec  4.  Pt will improve to improve overall R LE strength 4+/5 to improve stability and  mobility Baseline: refer above to MMT table 10/23/23 - met except B hip ER and R ankle PF 4/5 Goal status: MET - 11/08/23   5.  Pt will improve R knee AROM 0 to 105 to normalize gait pattern Baseline: R knee AROM 15 to 60 & PROM 7 to 62 10/23/23 - R knee AROM 2-103 10/29/23 - 2-106 - met for flexion ROM Goal status: PARTIALLY MET - 11/08/23 - R knee 2-104  6.  Pt will be able go up a flight stairs with alternating gait pattern w/ UE support w/ SPC or single hand rail to increase independence in community access Baseline:  Not tested 10/23/23 - difficulty with adequate R hip and knee flexion resulting in slight hip circumduction on reciprocal ascent, limited R knee flexion and eccentric quad control resulting in drop off step on reciprocal descent Goal status: MET - 11/08/23   7.  Pt will be able to implement step through gait pattern to safely ambulate in community w/ LRAD Baseline: Step to gait pattern w/ flexed posture using RW Goal status: MET - 10/23/23    PLAN:  PT FREQUENCY: 3x/week - 3x/wk x 2 weeks, tapering to 2x a week for duration of POC  PT DURATION: 8 weeks  PLANNED INTERVENTIONS: 97164- PT Re-evaluation, 97110-Therapeutic exercises, 97530- Therapeutic activity, 97112- Neuromuscular re-education, 97535- Self Care, 21308- Manual therapy, [id number]- Gait training, [phone]- Electrical stimulation (manual), 97016- Vasopneumatic device, 69629- Ionotophoresis 4mg /ml Dexamethasone , Patient/Family education, Balance training, Stair training, Taping, Dry Needling, Joint mobilization, Scar mobilization, DME instructions, Cryotherapy, Moist heat, and 97750 - Physical Performance Test or Measurement  PLAN FOR NEXT SESSION:  Transition to HEP + D/C from PT   PHYSICAL THERAPY DISCHARGE SUMMARY  Visits from Start of Care: 18  Current functional level related to goals / functional outcomes: Refer to above clinical impression and goal assessment.    Remaining deficits: R knee AROM 2-104    Education / Equipment: HEP, self-STM   Patient agrees to discharge. Patient goals were mostly met. Patient is being discharged due to being pleased with the current functional level.   Francisco Irving, PT 11/08/2023, 1:18 PM

## 2024-02-05 DIAGNOSIS — M25562 Pain in left knee: Secondary | ICD-10-CM | POA: Diagnosis not present

## 2024-02-11 ENCOUNTER — Encounter: Payer: Self-pay | Admitting: Medical-Surgical

## 2024-02-11 ENCOUNTER — Ambulatory Visit

## 2024-02-11 ENCOUNTER — Ambulatory Visit (INDEPENDENT_AMBULATORY_CARE_PROVIDER_SITE_OTHER): Admitting: Medical-Surgical

## 2024-02-11 VITALS — BP 155/78 | HR 79 | Resp 20 | Ht 64.0 in | Wt 228.0 lb

## 2024-02-11 DIAGNOSIS — M25552 Pain in left hip: Secondary | ICD-10-CM

## 2024-02-11 DIAGNOSIS — M47814 Spondylosis without myelopathy or radiculopathy, thoracic region: Secondary | ICD-10-CM | POA: Diagnosis not present

## 2024-02-11 DIAGNOSIS — M50323 Other cervical disc degeneration at C6-C7 level: Secondary | ICD-10-CM | POA: Diagnosis not present

## 2024-02-11 DIAGNOSIS — M48061 Spinal stenosis, lumbar region without neurogenic claudication: Secondary | ICD-10-CM | POA: Diagnosis not present

## 2024-02-11 DIAGNOSIS — M47816 Spondylosis without myelopathy or radiculopathy, lumbar region: Secondary | ICD-10-CM | POA: Diagnosis not present

## 2024-02-11 DIAGNOSIS — R200 Anesthesia of skin: Secondary | ICD-10-CM

## 2024-02-11 DIAGNOSIS — R202 Paresthesia of skin: Secondary | ICD-10-CM

## 2024-02-11 NOTE — Progress Notes (Signed)
        Established patient visit   History of Present Illness   Discussed the use of AI scribe software for clinical note transcription with the patient, who gave verbal consent to proceed.  History of Present Illness The patient presents with left leg pain radiating from the hip to the leg and left arm/hand numbness and tingling.  Left lower extremity pain - Sudden onset of left leg pain began last Tuesday while walking, originally in the knee - Was seen in [facility], Medrol  dosepak prescribed - Now, pain characterized as a deep ache starting in the hip and buttock, radiating down the leg - No numbness or tingling in the leg, new onset incontinence, weakness of the left leg, or left foot drop - No recent injuries or falls - X-rays at urgent care showed no knee abnormalities  Pain management and medication use - Prednisone  dose pack provided partial relief, but significant pain persists - Celebrex  taken at 200 mg daily, reduced to 100 mg but held completely during steroid use - Methocarbamol  used at night for muscle cramps since knee replacement surgery  Cervical paresthesia - Recent onset of tingling in the left side of the neck, occasionally radiating down the left arm - Tingling affects the entire left arm/hand - No associated weakness   Physical Exam   Physical Exam Vitals reviewed.  Constitutional:      General: She is not in acute distress.    Appearance: Normal appearance. She is obese. She is not ill-appearing.  HENT:     Head: Normocephalic and atraumatic.  Cardiovascular:     Rate and Rhythm: Normal rate and regular rhythm.     Pulses: Normal pulses.     Heart sounds: Normal heart sounds. No murmur heard.    No friction rub. No gallop.  Pulmonary:     Effort: Pulmonary effort is normal. No respiratory distress.     Breath sounds: Normal breath sounds. No wheezing.  Skin:    General: Skin is warm and dry.  Neurological:     Mental Status: She is alert and  oriented to person, place, and time.  Psychiatric:        Mood and Affect: Mood normal.        Behavior: Behavior normal.        Thought Content: Thought content normal.        Judgment: Judgment normal.     Assessment & Plan   Left-sided sciatica (lumbar radiculopathy) Left-sided sciatica with radiating pain, likely due to lumbar spine issues or SI joint dysfunction. Prednisone  provided limited relief, last day tomorrow. - Order lumbar spine x-rays for bony abnormalities and SI joint assessment. - Refer to formal physical therapy at [facility] - Resume Celebrex  200 mg daily post-prednisone . - Provide home exercises for SI joint pain management while waiting for PT appointment  Cervical radiculopathy, left arm Intermittent left arm tingling and numbness, likely cervical radiculopathy. No significant weakness. No prior cervical imaging. Exam unrevealing with negative Phalen's/Tinel's tests. - Order cervical spine x-rays for bony abnormalities.  Follow up   Return in about 6 weeks (around 03/24/2024) for arm tingling/leg pain follow up if not improving.  __________________________________ Zada FREDRIK Palin, DNP, APRN, FNP-BC Primary Care and Sports Medicine [facility]

## 2024-02-13 NOTE — Therapy (Signed)
 OUTPATIENT PHYSICAL THERAPY CERVICAL AND THORACOLUMBAR EVALUATION   Patient Name: Jean Hopkins MRN: [id number] DOB:05-12-1956, 68 y.o., female Today's Date: 02/14/2024   END OF SESSION:   Past Medical History:  Diagnosis Date   AF (atrial fibrillation) (HCC)    hyperthyroid induced   Allergy    Anemia    Arthritis 11/2020   Right knee   Blood transfusion without reported diagnosis    Cancer (HCC)    Melanoma x2   Cataract    Complication of anesthesia    Slow to wake up with general anesthesia   Controlled type 2 diabetes mellitus without complication, without long-term current use of insulin (HCC) 02/23/2022   no meds   GERD (gastroesophageal reflux disease)    Hypercholesterolemia 08/25/2022   Hypertension    Hyperthyroidism    Neuromuscular disorder (HCC)    neurpathy feet   Pneumonia    Past Surgical History:  Procedure Laterality Date   ABDOMINAL HYSTERECTOMY  2007   CHOLECYSTECTOMY  1988   TOTAL ABDOMINAL HYSTERECTOMY     TOTAL KNEE ARTHROPLASTY Right 09/13/2023   Procedure: ARTHROPLASTY, KNEE, TOTAL;  Surgeon: Ernie Cough, MD;  Location: [facility];  Service: Orthopedics;  Laterality: Right;   Patient Active Problem List   Diagnosis Date Noted   S/P total knee arthroplasty, right 09/13/2023   Postmenopausal 08/15/2023   Hypercholesterolemia 08/25/2022   Personal history of malignant melanoma of skin 08/25/2022   Vitamin D  deficiency 08/25/2022   Controlled type 2 diabetes mellitus without complication, without long-term current use of insulin (HCC) 02/23/2022   Hypertension 02/02/2021   Melanoma of skin (HCC) 02/02/2021   Raynaud's phenomenon 02/02/2021   Gastroesophageal reflux disease without esophagitis 01/14/2021   Osteoarthritis of right knee 01/04/2021   Hyperthyroidism    Atrial fibrillation (HCC) 07/29/2017    PCP: Willo Mini, NP   REFERRING PROVIDER: Willo Mini, NP   REFERRING DIAG: R20.2 (ICD-10-CM) - Paresthesia of left arm M25.552  (ICD-10-CM) - Left hip pain  THERAPY DIAG:  Cervicalgia  Pain in left leg  Muscle weakness (generalized)  Difficulty in walking, not elsewhere classified  RATIONALE FOR EVALUATION AND TREATMENT: Rehabilitation  ONSET DATE: 2 weeks ago LLE radicular pain  NEXT MD VISIT:    SUBJECTIVE:                                                                                                                                                                                                         SUBJECTIVE STATEMENT: 68 y/o female referred to PT from PCP for new onset low  back and LLE pain as well as LUE paresthesia.  LBP/LLE pain started 2 weeks ago for no apparent reason.  She reports numbness in the L arm and pain/aching from posterior L hip down the entire front and back of the LLE.  Prednisone  helped some.   She was requiring assistance to ambulate prior to taking the prednisone .  She is ambulating with no device now.  Taking Celebrex  again but was taking before. Neck pain:  states had some tingling feelings running from lower cspine up toward posterior skull last few months for no apparent reason.  Started having tingling/numbness in the LUE x 1 week that is all the way into her hand.   PAIN: Are you having pain? Yes: NPRS scale: 0/10 sitting still now;  8/10 worst Pain location: R hip and neck  Pain description: aching/radiating Aggravating factors: moving and walking a lot;  sitting with LLE in dependent positions Relieving factors: sitting in recliner with LLE elevated  PERTINENT HISTORY:  Afib, HTN, anemia, OA, diabetes, neuropathy, recent R TKA 3/25,   PRECAUTIONS: None  HAND DOMINANCE: Right  RED FLAGS: None  WEIGHT BEARING RESTRICTIONS: No  FALLS:  Has patient fallen in last 6 months? No  LIVING ENVIRONMENT: Lives with: lives with their family and lives with their spouse Lives in: House/apartment Stairs: Yes: External: 1 steps; none Has following equipment at home:  Single point cane and Environmental consultant - 2 wheeled  OCCUPATION: retired Charity fundraiser, Stage manager for [employer]  PLOF: Independent with gait  PATIENT GOALS: not hurt in my neck and LLE    OBJECTIVE:   DIAGNOSTIC FINDINGS:  lumbar and cervical Xray results are pending in the system (done 02/11/24)  PATIENT SURVEYS:  Quick Dash = 40.9 / 100 = 40.9 %     LEFS =  31 / 80 = 38.8 % SCREENING FOR RED FLAGS: Bowel or bladder incontinence: No Spinal tumors: No Cauda equina syndrome: No Compression fracture: No Abdominal aneurysm: No  COGNITION: Overall cognitive status: Within functional limits for tasks assessed     SENSATION: WFL  POSTURE:  rounded shoulders, forward head, and increased thoracic kyphosis/upper thoracic; no scoliosis noted; very flattened lumbar lordosis and not much lower to mid thoracic kyphosis  PALPATION: TTP over the L cervical spine, UT, and levator;  TTP over the L piriformis and hamstrings  CERVICAL ROM:   Active ROM Eval  Flexion 100%  Extension 60%  Right lateral flexion 50%  Left lateral flexion 50%  Right rotation 75%  Left rotation 60%; p!     Repeated motions do not impact her pain level or LUE parasthesia  UPPER EXTREMITY ROM:  Active ROM Right eval Left eval  Shoulder flexion    Shoulder extension    Shoulder abduction    Shoulder adduction    Shoulder internal rotation    Shoulder external rotation    Elbow flexion    Elbow extension    Wrist flexion    Wrist extension    Wrist ulnar deviation    Wrist radial deviation    Wrist pronation    Wrist supination      (Blank rows = not tested)  UPPER EXTREMITY MMT:  MMT Right eval Left eval  Shoulder flexion 180 180  Shoulder extension    Shoulder abduction    Shoulder adduction    Shoulder internal rotation    Shoulder external rotation    Middle trapezius    Lower trapezius    Elbow flexion  Elbow extension    Wrist flexion    Wrist extension    Wrist ulnar  deviation    Wrist radial deviation    Wrist pronation    Wrist supination    Grip strength    (Blank rows = not tested)  LUMBAR ROM:   Active  Eval  Flexion To midshins  Extension 65%; some endrange p!  Right lateral flexion To knee joint  Left lateral flexion Just above knee joint; p!  Right rotation 75%  Left rotation 60%; p!    (Blank rows = not tested)  MUSCLE LENGTH: Hamstrings: Right SLR 80 deg; Left SLR 70 deg Thomas test: Right 0 deg; Left 0 deg Hamstrings: 90/90 is 20 deg L; 30 deg R ITB: WNL BLE Piriformis: tight on L   LOWER EXTREMITY ROM:     Active  Right eval Left eval  Hip flexion    Hip extension    Hip abduction    Hip adduction    Hip internal rotation 30 20  Hip external rotation 40 35  Knee flexion    Knee extension    Ankle dorsiflexion    Ankle plantarflexion    Ankle inversion    Ankle eversion     (Blank rows = not tested)  LOWER EXTREMITY MMT:    MMT Right eval Left eval  Hip flexion 5 4  Hip extension    Hip abduction 4 4+  Hip adduction    Hip internal rotation 4 4-  Hip external rotation 4+ 4+  Knee flexion 5 5  Knee extension 5 5  Ankle dorsiflexion    Ankle plantarflexion 5 4  Ankle inversion    Ankle eversion      (Blank rows = not tested)  CERVICAL SPECIAL TESTS:  Upper limb tension test (ULTT): Negative, Spurling's test: Negative, and Distraction test: Positive;  Adson's test is negative LUE; ULTT negative LUE  LUMBAR SPECIAL TESTS:  Straight leg raise test: Negative, Slump test: Positive, SI Compression/distraction test: Negative, and FABER test: Positive;  LLE may be a little shorter in supine  FUNCTIONAL TESTS:  TBD  GAIT: Distance walked: into clinic distance Assistive device utilized: None Level of assistance: Complete Independence Gait pattern: decreased arm swing- Right, decreased arm swing- Left, decreased step length- Right, and decreased step length- Left Comments: slight antalgic LLE, supinates both  feet throughout gait cycle   TODAY'S TREATMENT:   02/14/24 SELF CARE: Provided education on PT POC progression and differential dx of cervical/lumbar nerve compression at level of spine vs. Muscular compression peripherally and difficult telling which based on today's evaluation;  initial HEP.  PATIENT EDUCATION:  Education details: PT eval findings, anticipated POC, and initial HEP  Person educated: Patient Education method: Explanation, Demonstration, Verbal cues, Tactile cues, and Handouts Education comprehension: verbalized understanding, verbal cues required, tactile cues required, and needs further education  HOME EXERCISE PROGRAM: Access Code: [email or web address] URL: [email or web address] Date: 02/17/2024 Prepared by: Garnette Montclair  Exercises - Supine Sciatic Nerve Glide  - 1 x daily - 7 x weekly - 3 sets - 10 reps - Seated Hamstring Stretch  - 1 x daily - 7 x weekly - 1 sets - 2 reps - Supine Anterior Pelvic Tilt  - 1 x daily - 7 x weekly - 3 sets - 10 reps - Standing Lumbar Extension  - 1 x daily - 7 x weekly - 1 sets - 10 reps - Standing Lumbar Extension at Wall - Forearms  - 1 x daily -  7 x weekly - 1 sets - 10 reps - Seated Cervical Extension AROM  - 1 x daily - 7 x weekly - 3 sets - 10 reps - Seated Cervical Retraction  - 1 x daily - 7 x weekly - 3 sets - 10 reps - Corner Pec Major Stretch  - 1 x daily - 7 x weekly - 1 sets - 2 reps - 1 min  hold   ASSESSMENT:  CLINICAL IMPRESSION: ONEDIA VARGUS is a 68 y.o. female who was referred to physical therapy for evaluation and treatment for R20.2 (ICD-10-CM) - Paresthesia of left arm M25.552 (ICD-10-CM) - Left hip pain.     Patient reports onset of LLE pain beginning 2 weeks ago. Pain is worse with prolonged activities with arms or legs.   Standing or being active worsens.   Sitting reclined helps.  Patient has deficits in lumbar and cervical ROM, L hip and L shoulder flexibility, LLE strength, abnormal cervical and  lumbar posture, and TTP with abnormal muscle tension in the paracervicals and L hip which are interfering with ADLs and are impacting quality of life.  On QuickDash patient scored 40.9/50 demonstrating 40.9% or moderate disability.  On LEFS patient scored 31/80 demonstrating  31 / 80 = 38.8 % or moderate disability.  Alicen will benefit from skilled PT to address above deficits to improve mobility and activity tolerance with decreased pain interference.  OBJECTIVE IMPAIRMENTS: difficulty walking, decreased ROM, decreased strength, and pain.   ACTIVITY LIMITATIONS: lifting, bending, and locomotion level  PARTICIPATION LIMITATIONS: meal prep, cleaning, laundry, shopping, and community activity  PERSONAL FACTORS: Age and 1-2 comorbidities: Afib, HTN, anemia, OA, diabetes, neuropathy, recent R TKA 3/25,  are also affecting patient's functional outcome.   REHAB POTENTIAL: Good  CLINICAL DECISION MAKING: Evolving/moderate complexity  EVALUATION COMPLEXITY: Moderate   GOALS: Goals reviewed with patient? Yes  SHORT TERM GOALS: Target date: 03/14/2024   Patient will be independent with initial HEP to improve outcomes and carryover.  Baseline: 100% PT assist required for correct completion Goal status: INITIAL  2.  Patient will report 25% improvement in neck and  back pain to improve QOL. Baseline: 8/10 worst Goal status: INITIAL  LONG TERM GOALS: Target date: 1017/25  Patient will be independent with ongoing/advanced HEP for self-management at home.  Baseline: no advanced HEP yet Goal status: INITIAL  2.  Patient will report 50-75% improvement in neck and  back pain to improve QOL.  Baseline: 8/10 worst Goal status: INITIAL  3.  Patient will demonstrate improved posture to decrease muscle imbalance. Baseline: flattened lumbar lordosis and increased upper thoracic kyphosis Goal status: INITIAL  5.  Patient will demonstrate full pain free cervical ROM for safety with driving.   Baseline: see cervical ROM tables above Goal status: INITIAL  6.  Patient will demonstrate full pain free lumbar ROM to perform ADLs.   Baseline: see lumbar ROM table above Goal status: INITIAL  8.  Patient will report </= 20% on Quick Dash to demonstrate improved functional ability.  Baseline: 40.9 / 100 = 40.9 % Goal status: INITIAL   9.  Patient will report </= 60% on LEFS  to demonstrate improved functional ability.  Baseline: 38.8% Goal status: INITIAL  10.  Patient to report ability to perform ADLs, household, and work-related tasks without limitation due to cervical/lumbar/LLE pain, LOM or weakness Baseline: in pain most of the time with chores Goal status: INITIAL   11.  Patient will tolerate 1hr of (standing/sitting/walking) to perform  shopping. Baseline: can stand 1 hr, but in pain Goal status: INITIAL  12.  Patient will report centralization of radicular symptoms.  Baseline: pain runs down the LLE to the knee level Goal status: INITIAL PLAN:  PT FREQUENCY: 1-2x/week  PT DURATION: 8 weeks  PLANNED INTERVENTIONS: 97164- PT Re-evaluation, 97750- Physical Performance Testing, 97110-Therapeutic exercises, 97530- Therapeutic activity, 97112- Neuromuscular re-education, 97535- Self Care, [id number]- Manual therapy, [id number]- Electrical stimulation (unattended), 97016- Vasopneumatic device, [id number]- Ultrasound, [id number]- Traction (mechanical), [id number]- Ionotophoresis 4mg /ml Dexamethasone , 79439 (1-2 muscles), 20561 (3+ muscles)- Dry Needling, Patient/Family education, Balance training, Stair training, Taping, Joint mobilization, Spinal mobilization, Cryotherapy, and Moist heat  PLAN FOR NEXT SESSION: assess and review HEP;  add strengthening; ? Cervical or lumbar traction   Amen Dargis, PT 02/17/2024, 3:55 PM

## 2024-02-14 ENCOUNTER — Encounter: Payer: Self-pay | Admitting: Rehabilitation

## 2024-02-14 ENCOUNTER — Other Ambulatory Visit: Payer: Self-pay

## 2024-02-14 ENCOUNTER — Encounter: Payer: Self-pay | Admitting: Medical-Surgical

## 2024-02-14 ENCOUNTER — Ambulatory Visit: Attending: Medical-Surgical | Admitting: Rehabilitation

## 2024-02-14 DIAGNOSIS — R202 Paresthesia of skin: Secondary | ICD-10-CM | POA: Diagnosis not present

## 2024-02-14 DIAGNOSIS — M542 Cervicalgia: Secondary | ICD-10-CM | POA: Insufficient documentation

## 2024-02-14 DIAGNOSIS — R262 Difficulty in walking, not elsewhere classified: Secondary | ICD-10-CM | POA: Diagnosis not present

## 2024-02-14 DIAGNOSIS — M79605 Pain in left leg: Secondary | ICD-10-CM | POA: Insufficient documentation

## 2024-02-14 DIAGNOSIS — M6281 Muscle weakness (generalized): Secondary | ICD-10-CM | POA: Insufficient documentation

## 2024-02-14 DIAGNOSIS — M25661 Stiffness of right knee, not elsewhere classified: Secondary | ICD-10-CM | POA: Insufficient documentation

## 2024-02-14 DIAGNOSIS — R2689 Other abnormalities of gait and mobility: Secondary | ICD-10-CM | POA: Diagnosis not present

## 2024-02-14 DIAGNOSIS — M25552 Pain in left hip: Secondary | ICD-10-CM | POA: Insufficient documentation

## 2024-02-18 ENCOUNTER — Ambulatory Visit: Admitting: Rehabilitation

## 2024-02-18 DIAGNOSIS — M6281 Muscle weakness (generalized): Secondary | ICD-10-CM | POA: Diagnosis not present

## 2024-02-18 DIAGNOSIS — M542 Cervicalgia: Secondary | ICD-10-CM

## 2024-02-18 DIAGNOSIS — R262 Difficulty in walking, not elsewhere classified: Secondary | ICD-10-CM

## 2024-02-18 DIAGNOSIS — M79605 Pain in left leg: Secondary | ICD-10-CM

## 2024-02-18 DIAGNOSIS — R2689 Other abnormalities of gait and mobility: Secondary | ICD-10-CM

## 2024-02-18 DIAGNOSIS — M25661 Stiffness of right knee, not elsewhere classified: Secondary | ICD-10-CM | POA: Diagnosis not present

## 2024-02-18 DIAGNOSIS — R202 Paresthesia of skin: Secondary | ICD-10-CM | POA: Diagnosis not present

## 2024-02-18 DIAGNOSIS — M25552 Pain in left hip: Secondary | ICD-10-CM | POA: Diagnosis not present

## 2024-02-18 NOTE — Therapy (Signed)
 OUTPATIENT PHYSICAL THERAPY CERVICAL AND THORACOLUMBAR EVALUATION   Patient Name: SHAHANA CAPES MRN: [id number] DOB:1955/11/25, 68 y.o., female Today's Date: 02/14/2024   END OF SESSION:  PT End of Session - 02/18/24 1311     Visit Number 2    PT Start Time 1312    PT Stop Time 1400    PT Time Calculation (min) 48 min    Activity Tolerance Patient tolerated treatment well    Behavior During Therapy WFL for tasks assessed/performed          Past Medical History:  Diagnosis Date   AF (atrial fibrillation) (HCC)    hyperthyroid induced   Allergy    Anemia    Arthritis 11/2020   Right knee   Blood transfusion without reported diagnosis    Cancer (HCC)    Melanoma x2   Cataract    Complication of anesthesia    Slow to wake up with general anesthesia   Controlled type 2 diabetes mellitus without complication, without long-term current use of insulin (HCC) 02/23/2022   no meds   GERD (gastroesophageal reflux disease)    Hypercholesterolemia 08/25/2022   Hypertension    Hyperthyroidism    Neuromuscular disorder (HCC)    neurpathy feet   Pneumonia    Past Surgical History:  Procedure Laterality Date   ABDOMINAL HYSTERECTOMY  2007   CHOLECYSTECTOMY  1988   TOTAL ABDOMINAL HYSTERECTOMY     TOTAL KNEE ARTHROPLASTY Right 09/13/2023   Procedure: ARTHROPLASTY, KNEE, TOTAL;  Surgeon: Ernie Cough, MD;  Location: [facility];  Service: Orthopedics;  Laterality: Right;   Patient Active Problem List   Diagnosis Date Noted   S/P total knee arthroplasty, right 09/13/2023   Postmenopausal 08/15/2023   Hypercholesterolemia 08/25/2022   Personal history of malignant melanoma of skin 08/25/2022   Vitamin D  deficiency 08/25/2022   Controlled type 2 diabetes mellitus without complication, without long-term current use of insulin (HCC) 02/23/2022   Hypertension 02/02/2021   Melanoma of skin (HCC) 02/02/2021   Raynaud's phenomenon 02/02/2021   Gastroesophageal reflux disease without  esophagitis 01/14/2021   Osteoarthritis of right knee 01/04/2021   Hyperthyroidism    Atrial fibrillation (HCC) 07/29/2017    PCP: Willo Mini, NP   REFERRING PROVIDER: Willo Mini, NP   REFERRING DIAG: R20.2 (ICD-10-CM) - Paresthesia of left arm M25.552 (ICD-10-CM) - Left hip pain  THERAPY DIAG:  Cervicalgia  Muscle weakness (generalized)  Difficulty in walking, not elsewhere classified  Stiffness of right knee, not elsewhere classified  Pain in left leg  Other abnormalities of gait and mobility  RATIONALE FOR EVALUATION AND TREATMENT: Rehabilitation  ONSET DATE: 2 weeks ago LLE radicular pain  NEXT MD VISIT:    SUBJECTIVE:  SUBJECTIVE STATEMENT: 02/18/24:  States feeling a little better in the LUE with less numbness/tingling.  States pain in the LLE today is 3/10, but overnight is getting to be 7-8/10.   Admits mattress is old and needs replacing.  68 y/o female referred to PT from PCP for new onset low back and LLE pain as well as LUE paresthesia.  LBP/LLE pain started 2 weeks ago for no apparent reason.  She reports numbness in the L arm and pain/aching from posterior L hip down the entire front and back of the LLE.  Prednisone  helped some.   She was requiring assistance to ambulate prior to taking the prednisone .  She is ambulating with no device now.  Taking Celebrex  again but was taking before. Neck pain:  states had some tingling feelings running from lower cspine up toward posterior skull last few months for no apparent reason.  Started having tingling/numbness in the LUE x 1 week that is all the way into her hand.   PAIN: Are you having pain? Yes: NPRS scale: 0/10 sitting still now;  8/10 worst Pain location: R hip and neck  Pain description:  aching/radiating Aggravating factors: moving and walking a lot;  sitting with LLE in dependent positions Relieving factors: sitting in recliner with LLE elevated  PERTINENT HISTORY:  Afib, HTN, anemia, OA, diabetes, neuropathy, recent R TKA 3/25,   PRECAUTIONS: None  HAND DOMINANCE: Right  RED FLAGS: None  WEIGHT BEARING RESTRICTIONS: No  FALLS:  Has patient fallen in last 6 months? No  LIVING ENVIRONMENT: Lives with: lives with their family and lives with their spouse Lives in: House/apartment Stairs: Yes: External: 1 steps; none Has following equipment at home: Single point cane and Environmental consultant - 2 wheeled  OCCUPATION: retired Charity fundraiser, Stage manager for [employer]  PLOF: Independent with gait  PATIENT GOALS: not hurt in my neck and LLE    OBJECTIVE:   DIAGNOSTIC FINDINGS:  lumbar and cervical Xray results are pending in the system (done 02/11/24)  PATIENT SURVEYS:  Quick Dash = 40.9 / 100 = 40.9 %     LEFS =  31 / 80 = 38.8 % SCREENING FOR RED FLAGS: Bowel or bladder incontinence: No Spinal tumors: No Cauda equina syndrome: No Compression fracture: No Abdominal aneurysm: No  COGNITION: Overall cognitive status: Within functional limits for tasks assessed     SENSATION: WFL  POSTURE:  rounded shoulders, forward head, and increased thoracic kyphosis/upper thoracic; no scoliosis noted; very flattened lumbar lordosis and not much lower to mid thoracic kyphosis  PALPATION: TTP over the L cervical spine, UT, and levator;  TTP over the L piriformis and hamstrings  CERVICAL ROM:   Active ROM Eval  Flexion 100%  Extension 60%  Right lateral flexion 50%  Left lateral flexion 50%  Right rotation 75%  Left rotation 60%; p!     Repeated motions do not impact her pain level or LUE parasthesia  UPPER EXTREMITY ROM:  Active ROM Right eval Left eval  Shoulder flexion    Shoulder extension    Shoulder abduction    Shoulder adduction    Shoulder  internal rotation    Shoulder external rotation    Elbow flexion    Elbow extension    Wrist flexion    Wrist extension    Wrist ulnar deviation    Wrist radial deviation    Wrist pronation    Wrist supination      (Blank rows = not tested)  UPPER EXTREMITY  MMT:  MMT Right eval Left eval  Shoulder flexion 180 180  Shoulder extension    Shoulder abduction    Shoulder adduction    Shoulder internal rotation    Shoulder external rotation    Middle trapezius    Lower trapezius    Elbow flexion    Elbow extension    Wrist flexion    Wrist extension    Wrist ulnar deviation    Wrist radial deviation    Wrist pronation    Wrist supination    Grip strength    (Blank rows = not tested)  LUMBAR ROM:   Active  Eval  Flexion To midshins  Extension 65%; some endrange p!  Right lateral flexion To knee joint  Left lateral flexion Just above knee joint; p!  Right rotation 75%  Left rotation 60%; p!    (Blank rows = not tested)  MUSCLE LENGTH: Hamstrings: Right SLR 80 deg; Left SLR 70 deg Thomas test: Right 0 deg; Left 0 deg Hamstrings: 90/90 is 20 deg L; 30 deg R ITB: WNL BLE Piriformis: tight on L   LOWER EXTREMITY ROM:     Active  Right eval Left eval  Hip flexion    Hip extension    Hip abduction    Hip adduction    Hip internal rotation 30 20  Hip external rotation 40 35  Knee flexion    Knee extension    Ankle dorsiflexion    Ankle plantarflexion    Ankle inversion    Ankle eversion     (Blank rows = not tested)  LOWER EXTREMITY MMT:    MMT Right eval Left eval  Hip flexion 5 4  Hip extension    Hip abduction 4 4+  Hip adduction    Hip internal rotation 4 4-  Hip external rotation 4+ 4+  Knee flexion 5 5  Knee extension 5 5  Ankle dorsiflexion    Ankle plantarflexion 5 4  Ankle inversion    Ankle eversion      (Blank rows = not tested)  CERVICAL SPECIAL TESTS:  Upper limb tension test (ULTT): Negative, Spurling's test: Negative, and  Distraction test: Positive;  Adson's test is negative LUE; ULTT negative LUE  LUMBAR SPECIAL TESTS:  Straight leg raise test: Negative, Slump test: Positive, SI Compression/distraction test: Negative, and FABER test: Positive;  LLE may be a little shorter in supine  FUNCTIONAL TESTS:  TBD  GAIT: Distance walked: into clinic distance Assistive device utilized: None Level of assistance: Complete Independence Gait pattern: decreased arm swing- Right, decreased arm swing- Left, decreased step length- Right, and decreased step length- Left Comments: slight antalgic LLE, supinates both feet throughout gait cycle   TODAY'S TREATMENT:  02/18/24 THERAPEUTIC EXERCISE: To improve strength.  Demonstration, verbal and tactile cues throughout for technique. Bike partial RPM x 6'  THERAPEUTIC ACTIVITIES: To improve functional performance.  Demonstration, verbal and tactile cues throughout for technique. Standing hip ABD 3# x 20 LLE Standing hip ext 3# x 20 LLE Counter back ext x 15 Supine LTR x 15 Supine FABER piriformis stretch  NEUROMUSCULAR RE-EDUCATION: To improve kinesthesia and posture.with transverse abdominal contractions on all exercises Supine TA contractions x 15 Supine Peanut rollouts/TA x 20 Supine Ball S/S with TA x 20 BLE Supine bridge/TA x 20 BLE  MANUAL THERAPY: To promote reduced pain utilizing STM. STM of L piriformis and buttock with theragun   02/14/24 SELF CARE: Provided education on PT POC progression and differential dx of  cervical/lumbar nerve compression at level of spine vs. Muscular compression peripherally and difficult telling which based on today's evaluation;  initial HEP.  PATIENT EDUCATION:  Education details: HEP review, HEP update, and contracting transverse abdominals with ADL  Person educated: Patient Education method: Explanation, Demonstration, Verbal cues, Tactile cues, and Handouts Education comprehension: verbalized understanding, verbal cues  required, tactile cues required, and needs further education  HOME EXERCISE PROGRAM: Access Code: [email or web address] URL: [email or web address] Date: 02/17/2024 Prepared by: Garnette Montclair  Exercises - Supine Sciatic Nerve Glide  - 1 x daily - 7 x weekly - 3 sets - 10 reps - Seated Hamstring Stretch  - 1 x daily - 7 x weekly - 1 sets - 2 reps - Supine Anterior Pelvic Tilt  - 1 x daily - 7 x weekly - 3 sets - 10 reps - Standing Lumbar Extension  - 1 x daily - 7 x weekly - 1 sets - 10 reps - Standing Lumbar Extension at Wall - Forearms  - 1 x daily - 7 x weekly - 1 sets - 10 reps - Seated Cervical Extension AROM  - 1 x daily - 7 x weekly - 3 sets - 10 reps - Seated Cervical Retraction  - 1 x daily - 7 x weekly - 3 sets - 10 reps - Corner Pec Major Stretch  - 1 x daily - 7 x weekly - 1 sets - 2 reps - 1 min  hold   ASSESSMENT:  CLINICAL IMPRESSION: HEP is progressed to include core strength/stabilization with LE movements. Prior HEP reviewed and assistance is required from PT with tactile and vc for correct completion.  She continues to have mostly LLE pain.  LBP is not as significant.  Pain runs from posterior hip down front and back of thigh.   Added piriformis targeted stretching.  PT remains necessary for pain, ROM, strength, HEP deficits.  Continue per POC  ARYAA BUNTING is a 68 y.o. female who was referred to physical therapy for evaluation and treatment for R20.2 (ICD-10-CM) - Paresthesia of left arm M25.552 (ICD-10-CM) - Left hip pain.     Patient reports onset of LLE pain beginning 2 weeks ago. Pain is worse with prolonged activities with arms or legs.   Standing or being active worsens.   Sitting reclined helps.  Patient has deficits in lumbar and cervical ROM, L hip and L shoulder flexibility, LLE strength, abnormal cervical and lumbar posture, and TTP with abnormal muscle tension in the paracervicals and L hip which are interfering with ADLs and are impacting quality of life.   On QuickDash patient scored 40.9/50 demonstrating 40.9% or moderate disability.  On LEFS patient scored 31/80 demonstrating  31 / 80 = 38.8 % or moderate disability.  Mikki will benefit from skilled PT to address above deficits to improve mobility and activity tolerance with decreased pain interference.  OBJECTIVE IMPAIRMENTS: difficulty walking, decreased ROM, decreased strength, and pain.   ACTIVITY LIMITATIONS: lifting, bending, and locomotion level  PARTICIPATION LIMITATIONS: meal prep, cleaning, laundry, shopping, and community activity  PERSONAL FACTORS: Age and 1-2 comorbidities: Afib, HTN, anemia, OA, diabetes, neuropathy, recent R TKA 3/25,  are also affecting patient's functional outcome.   REHAB POTENTIAL: Good  CLINICAL DECISION MAKING: Evolving/moderate complexity  EVALUATION COMPLEXITY: Moderate   GOALS: Goals reviewed with patient? Yes  SHORT TERM GOALS: Target date: 03/14/2024   Patient will be independent with initial HEP to improve outcomes and carryover.  Baseline: 100% PT assist required for correct completion 02/18/24  added to HEP Goal status: IN PROGRESS  2.  Patient will report 25% improvement in neck and  back pain to improve QOL. Baseline: 8/10 worst Goal status: INITIAL  LONG TERM GOALS: Target date: 1017/25  Patient will be independent with ongoing/advanced HEP for self-management at home.  Baseline: no advanced HEP yet 02/18/24 advancing HEP Goal status: IN PROGRESS  2.  Patient will report 50-75% improvement in neck and  back pain to improve QOL.  Baseline: 8/10 worst Goal status: INITIAL  3.  Patient will demonstrate improved posture to decrease muscle imbalance. Baseline: flattened lumbar lordosis and increased upper thoracic kyphosis Goal status: INITIAL  5.  Patient will demonstrate full pain free cervical ROM for safety with driving.  Baseline: see cervical ROM tables above Goal status: INITIAL  6.  Patient will demonstrate full pain  free lumbar ROM to perform ADLs.   Baseline: see lumbar ROM table above Goal status: INITIAL  8.  Patient will report </= 20% on Quick Dash to demonstrate improved functional ability.  Baseline: 40.9 / 100 = 40.9 % Goal status: INITIAL   9.  Patient will report </= 60% on LEFS  to demonstrate improved functional ability.  Baseline: 38.8% Goal status: INITIAL  10.  Patient to report ability to perform ADLs, household, and work-related tasks without limitation due to cervical/lumbar/LLE pain, LOM or weakness Baseline: in pain most of the time with chores Goal status: INITIAL   11.  Patient will tolerate 1hr of (standing/sitting/walking) to perform shopping. Baseline: can stand 1 hr, but in pain Goal status: INITIAL  12.  Patient will report centralization of radicular symptoms.  Baseline: pain runs down the LLE to the knee level Goal status: INITIAL PLAN:  PT FREQUENCY: 1-2x/week  PT DURATION: 8 weeks  PLANNED INTERVENTIONS: [id number]- PT Re-evaluation, 97750- Physical Performance Testing, 97110-Therapeutic exercises, 97530- Therapeutic activity, [id number]- Neuromuscular re-education, 97535- Self Care, [id number]- Manual therapy, [id number]- Electrical stimulation (unattended), 97016- Vasopneumatic device, [id number]- Ultrasound, [id number]- Traction (mechanical), [id number]- Ionotophoresis 4mg /ml Dexamethasone , [id number] (1-2 muscles), [id number] (3+ muscles)- Dry Needling, Patient/Family education, Balance training, Stair training, Taping, Joint mobilization, Spinal mobilization, Cryotherapy, and Moist heat  PLAN FOR NEXT SESSION: assess how piriformis stretching impacted pain; see if STM helped; continue hip strengthening; TRX activities?    Edana Aguado, PT 02/18/2024, 9:24 PM

## 2024-02-21 ENCOUNTER — Ambulatory Visit

## 2024-02-21 DIAGNOSIS — M25661 Stiffness of right knee, not elsewhere classified: Secondary | ICD-10-CM

## 2024-02-21 DIAGNOSIS — R2689 Other abnormalities of gait and mobility: Secondary | ICD-10-CM

## 2024-02-21 DIAGNOSIS — R262 Difficulty in walking, not elsewhere classified: Secondary | ICD-10-CM | POA: Diagnosis not present

## 2024-02-21 DIAGNOSIS — M6281 Muscle weakness (generalized): Secondary | ICD-10-CM | POA: Diagnosis not present

## 2024-02-21 DIAGNOSIS — M542 Cervicalgia: Secondary | ICD-10-CM | POA: Diagnosis not present

## 2024-02-21 DIAGNOSIS — M79605 Pain in left leg: Secondary | ICD-10-CM

## 2024-02-21 DIAGNOSIS — R202 Paresthesia of skin: Secondary | ICD-10-CM | POA: Diagnosis not present

## 2024-02-21 DIAGNOSIS — M25552 Pain in left hip: Secondary | ICD-10-CM | POA: Diagnosis not present

## 2024-02-21 NOTE — Therapy (Signed)
 OUTPATIENT PHYSICAL THERAPY CERVICAL AND THORACOLUMBAR TREATMENT   Patient Name: Jean Hopkins MRN: [id number] DOB:06/28/1955, 68 y.o., female Today's Date: 02/14/2024   END OF SESSION:  PT End of Session - 02/21/24 1408     Visit Number 3    PT Start Time 1400    PT Stop Time 1445    PT Time Calculation (min) 45 min    Activity Tolerance Patient tolerated treatment well    Behavior During Therapy WFL for tasks assessed/performed           Past Medical History:  Diagnosis Date   AF (atrial fibrillation) (HCC)    hyperthyroid induced   Allergy    Anemia    Arthritis 11/2020   Right knee   Blood transfusion without reported diagnosis    Cancer (HCC)    Melanoma x2   Cataract    Complication of anesthesia    Slow to wake up with general anesthesia   Controlled type 2 diabetes mellitus without complication, without long-term current use of insulin (HCC) 02/23/2022   no meds   GERD (gastroesophageal reflux disease)    Hypercholesterolemia 08/25/2022   Hypertension    Hyperthyroidism    Neuromuscular disorder (HCC)    neurpathy feet   Pneumonia    Past Surgical History:  Procedure Laterality Date   ABDOMINAL HYSTERECTOMY  2007   CHOLECYSTECTOMY  1988   TOTAL ABDOMINAL HYSTERECTOMY     TOTAL KNEE ARTHROPLASTY Right 09/13/2023   Procedure: ARTHROPLASTY, KNEE, TOTAL;  Surgeon: Ernie Cough, MD;  Location: [facility];  Service: Orthopedics;  Laterality: Right;   Patient Active Problem List   Diagnosis Date Noted   S/P total knee arthroplasty, right 09/13/2023   Postmenopausal 08/15/2023   Hypercholesterolemia 08/25/2022   Personal history of malignant melanoma of skin 08/25/2022   Vitamin D  deficiency 08/25/2022   Controlled type 2 diabetes mellitus without complication, without long-term current use of insulin (HCC) 02/23/2022   Hypertension 02/02/2021   Melanoma of skin (HCC) 02/02/2021   Raynaud's phenomenon 02/02/2021   Gastroesophageal reflux disease without  esophagitis 01/14/2021   Osteoarthritis of right knee 01/04/2021   Hyperthyroidism    Atrial fibrillation (HCC) 07/29/2017    PCP: Willo Mini, NP   REFERRING PROVIDER: Willo Mini, NP   REFERRING DIAG: R20.2 (ICD-10-CM) - Paresthesia of left arm M25.552 (ICD-10-CM) - Left hip pain  THERAPY DIAG:  Cervicalgia  Muscle weakness (generalized)  Difficulty in walking, not elsewhere classified  Stiffness of right knee, not elsewhere classified  Pain in left leg  Other abnormalities of gait and mobility  RATIONALE FOR EVALUATION AND TREATMENT: Rehabilitation  ONSET DATE: 2 weeks ago LLE radicular pain  NEXT MD VISIT:    SUBJECTIVE:  SUBJECTIVE STATEMENT: Pt reports her exercises doing well, doing everyday, neck pain almost resolved, hip still bothering her.  68 y/o female referred to PT from PCP for new onset low back and LLE pain as well as LUE paresthesia.  LBP/LLE pain started 2 weeks ago for no apparent reason.  She reports numbness in the L arm and pain/aching from posterior L hip down the entire front and back of the LLE.  Prednisone  helped some.   She was requiring assistance to ambulate prior to taking the prednisone .  She is ambulating with no device now.  Taking Celebrex  again but was taking before. Neck pain:  states had some tingling feelings running from lower cspine up toward posterior skull last few months for no apparent reason.  Started having tingling/numbness in the LUE x 1 week that is all the way into her hand.   PAIN: Are you having pain? Yes: NPRS scale: 2/10 sitting still now;  8/10 worst Pain location: R hip and neck  Pain description: aching/radiating Aggravating factors: moving and walking a lot;  sitting with LLE in dependent positions Relieving factors:  sitting in recliner with LLE elevated  PERTINENT HISTORY:  Afib, HTN, anemia, OA, diabetes, neuropathy, recent R TKA 3/25,   PRECAUTIONS: None  HAND DOMINANCE: Right  RED FLAGS: None  WEIGHT BEARING RESTRICTIONS: No  FALLS:  Has patient fallen in last 6 months? No  LIVING ENVIRONMENT: Lives with: lives with their family and lives with their spouse Lives in: House/apartment Stairs: Yes: External: 1 steps; none Has following equipment at home: Single point cane and Environmental consultant - 2 wheeled  OCCUPATION: retired Charity fundraiser, Stage manager for [employer]  PLOF: Independent with gait  PATIENT GOALS: not hurt in my neck and LLE    OBJECTIVE:   DIAGNOSTIC FINDINGS:  lumbar and cervical Xray results are pending in the system (done 02/11/24)  PATIENT SURVEYS:  Quick Dash = 40.9 / 100 = 40.9 %     LEFS =  31 / 80 = 38.8 % SCREENING FOR RED FLAGS: Bowel or bladder incontinence: No Spinal tumors: No Cauda equina syndrome: No Compression fracture: No Abdominal aneurysm: No  COGNITION: Overall cognitive status: Within functional limits for tasks assessed     SENSATION: WFL  POSTURE:  rounded shoulders, forward head, and increased thoracic kyphosis/upper thoracic; no scoliosis noted; very flattened lumbar lordosis and not much lower to mid thoracic kyphosis  PALPATION: TTP over the L cervical spine, UT, and levator;  TTP over the L piriformis and hamstrings  CERVICAL ROM:   Active ROM Eval  Flexion 100%  Extension 60%  Right lateral flexion 50%  Left lateral flexion 50%  Right rotation 75%  Left rotation 60%; p!     Repeated motions do not impact her pain level or LUE parasthesia  UPPER EXTREMITY ROM:  Active ROM Right eval Left eval  Shoulder flexion    Shoulder extension    Shoulder abduction    Shoulder adduction    Shoulder internal rotation    Shoulder external rotation    Elbow flexion    Elbow extension    Wrist flexion    Wrist extension     Wrist ulnar deviation    Wrist radial deviation    Wrist pronation    Wrist supination      (Blank rows = not tested)  UPPER EXTREMITY MMT:  MMT Right eval Left eval  Shoulder flexion 180 180  Shoulder extension    Shoulder abduction  Shoulder adduction    Shoulder internal rotation    Shoulder external rotation    Middle trapezius    Lower trapezius    Elbow flexion    Elbow extension    Wrist flexion    Wrist extension    Wrist ulnar deviation    Wrist radial deviation    Wrist pronation    Wrist supination    Grip strength    (Blank rows = not tested)  LUMBAR ROM:   Active  Eval  Flexion To midshins  Extension 65%; some endrange p!  Right lateral flexion To knee joint  Left lateral flexion Just above knee joint; p!  Right rotation 75%  Left rotation 60%; p!    (Blank rows = not tested)  MUSCLE LENGTH: Hamstrings: Right SLR 80 deg; Left SLR 70 deg Thomas test: Right 0 deg; Left 0 deg Hamstrings: 90/90 is 20 deg L; 30 deg R ITB: WNL BLE Piriformis: tight on L   LOWER EXTREMITY ROM:     Active  Right eval Left eval  Hip flexion    Hip extension    Hip abduction    Hip adduction    Hip internal rotation 30 20  Hip external rotation 40 35  Knee flexion    Knee extension    Ankle dorsiflexion    Ankle plantarflexion    Ankle inversion    Ankle eversion     (Blank rows = not tested)  LOWER EXTREMITY MMT:    MMT Right eval Left eval  Hip flexion 5 4  Hip extension    Hip abduction 4 4+  Hip adduction    Hip internal rotation 4 4-  Hip external rotation 4+ 4+  Knee flexion 5 5  Knee extension 5 5  Ankle dorsiflexion    Ankle plantarflexion 5 4  Ankle inversion    Ankle eversion      (Blank rows = not tested)  CERVICAL SPECIAL TESTS:  Upper limb tension test (ULTT): Negative, Spurling's test: Negative, and Distraction test: Positive;  Adson's test is negative LUE; ULTT negative LUE  LUMBAR SPECIAL TESTS:  Straight leg raise test:  Negative, Slump test: Positive, SI Compression/distraction test: Negative, and FABER test: Positive;  LLE may be a little shorter in supine  FUNCTIONAL TESTS:  TBD  GAIT: Distance walked: into clinic distance Assistive device utilized: None Level of assistance: Complete Independence Gait pattern: decreased arm swing- Right, decreased arm swing- Left, decreased step length- Right, and decreased step length- Left Comments: slight antalgic LLE, supinates both feet throughout gait cycle   TODAY'S TREATMENT:  02/21/24 THERAPEUTIC EXERCISE: To improve strength.  Demonstration, verbal and tactile cues throughout for technique. Bike partial RPM x 8' Bridges x 20 S/L hip abduction x 20 B Supne KTOS and figure4 stretch 1 min each L side  THERAPEUTIC ACTIVITIES: To improve functional performance.  Demonstration, verbal and tactile cues throughout for technique. Standing hip ABD 3# x 20 LLE Standing hip ext 3# x 20 LLE Standing hip ABD to ext x 20 LLE  Standing marching 3# x 20 BLE Supine LTR x 20 Supine FABER piriformis stretch   MANUAL THERAPY: To promote reduced pain utilizing STM. STM of L piriformis, glutes, hamstrings with theragun  02/18/24 THERAPEUTIC EXERCISE: To improve strength.  Demonstration, verbal and tactile cues throughout for technique. Bike partial RPM x 6'  THERAPEUTIC ACTIVITIES: To improve functional performance.  Demonstration, verbal and tactile cues throughout for technique. Standing hip ABD 3# x 20 LLE Standing  hip ext 3# x 20 LLE Counter back ext x 15 Supine LTR x 15 Supine FABER piriformis stretch  NEUROMUSCULAR RE-EDUCATION: To improve kinesthesia and posture.with transverse abdominal contractions on all exercises Supine TA contractions x 15 Supine Peanut rollouts/TA x 20 Supine Ball S/S with TA x 20 BLE Supine bridge/TA x 20 BLE  MANUAL THERAPY: To promote reduced pain utilizing STM. STM of L piriformis and buttock with theragun   02/14/24 SELF  CARE: Provided education on PT POC progression and differential dx of cervical/lumbar nerve compression at level of spine vs. Muscular compression peripherally and difficult telling which based on today's evaluation;  initial HEP.  PATIENT EDUCATION:  Education details: HEP review, HEP update, and contracting transverse abdominals with ADL  Person educated: Patient Education method: Explanation, Demonstration, Verbal cues, Tactile cues, and Handouts Education comprehension: verbalized understanding, verbal cues required, tactile cues required, and needs further education  HOME EXERCISE PROGRAM: Access Code: [email or web address] URL: [email or web address] Date: 02/21/2024 Prepared by: Raelee Rossmann  Exercises - Supine Sciatic Nerve Glide  - 1 x daily - 7 x weekly - 3 sets - 10 reps - Seated Hamstring Stretch  - 1 x daily - 7 x weekly - 1 sets - 2 reps - Supine Anterior Pelvic Tilt  - 1 x daily - 7 x weekly - 3 sets - 10 reps - Standing Lumbar Extension  - 1 x daily - 7 x weekly - 1 sets - 10 reps - Standing Lumbar Extension at Wall - Forearms  - 1 x daily - 7 x weekly - 1 sets - 10 reps - Seated Cervical Extension AROM  - 1 x daily - 7 x weekly - 3 sets - 10 reps - Seated Cervical Retraction  - 1 x daily - 7 x weekly - 3 sets - 10 reps - Corner Pec Major Stretch  - 1 x daily - 7 x weekly - 1 sets - 2 reps - 1 min  hold - Supine Figure 4 Piriformis Stretch  - 1 x daily - 7 x weekly - 1 sets - 2 reps - 1 min hold - Supine Gluteus Stretch  - 1 x daily - 7 x weekly - 1 sets - 2 reps - Standing Hip Abduction with Counter Support  - 1 x daily - 7 x weekly - 3 sets - 10 reps - Standing Hip Extension with Counter Support  - 1 x daily - 7 x weekly - 3 sets - 10 reps - Standing Marching  - 1 x daily - 7 x weekly - 3 sets - 10 reps - Supine Bridge  - 1 x daily - 7 x weekly - 3 sets - 10 reps   ASSESSMENT:  CLINICAL IMPRESSION: Continued progression of exercises within patient's tolerance.  Provided cues as needed with interventions. Tolerated session well. PT remains necessary for pain, ROM, strength, HEP deficits.  Continue per POC  Jean Hopkins is a 68 y.o. female who was referred to physical therapy for evaluation and treatment for R20.2 (ICD-10-CM) - Paresthesia of left arm M25.552 (ICD-10-CM) - Left hip pain.     Patient reports onset of LLE pain beginning 2 weeks ago. Pain is worse with prolonged activities with arms or legs.   Standing or being active worsens.   Sitting reclined helps.  Patient has deficits in lumbar and cervical ROM, L hip and L shoulder flexibility, LLE strength, abnormal cervical and lumbar posture, and TTP with abnormal muscle tension in the paracervicals and L hip  which are interfering with ADLs and are impacting quality of life.  On QuickDash patient scored 40.9/50 demonstrating 40.9% or moderate disability.  On LEFS patient scored 31/80 demonstrating  31 / 80 = 38.8 % or moderate disability.  Jean Hopkins will benefit from skilled PT to address above deficits to improve mobility and activity tolerance with decreased pain interference.  OBJECTIVE IMPAIRMENTS: difficulty walking, decreased ROM, decreased strength, and pain.   ACTIVITY LIMITATIONS: lifting, bending, and locomotion level  PARTICIPATION LIMITATIONS: meal prep, cleaning, laundry, shopping, and community activity  PERSONAL FACTORS: Age and 1-2 comorbidities: Afib, HTN, anemia, OA, diabetes, neuropathy, recent R TKA 3/25,  are also affecting patient's functional outcome.   REHAB POTENTIAL: Good  CLINICAL DECISION MAKING: Evolving/moderate complexity  EVALUATION COMPLEXITY: Moderate   GOALS: Goals reviewed with patient? Yes  SHORT TERM GOALS: Target date: 03/14/2024   Patient will be independent with initial HEP to improve outcomes and carryover.  Baseline: 100% PT assist required for correct completion 02/18/24 added to HEP Goal status: IN PROGRESS  2.  Patient will report 25% improvement in  neck and  back pain to improve QOL. Baseline: 8/10 worst Goal status: INITIAL  LONG TERM GOALS: Target date: 1017/25  Patient will be independent with ongoing/advanced HEP for self-management at home.  Baseline: no advanced HEP yet 02/18/24 advancing HEP Goal status: IN PROGRESS  2.  Patient will report 50-75% improvement in neck and  back pain to improve QOL.  Baseline: 8/10 worst Goal status: INITIAL  3.  Patient will demonstrate improved posture to decrease muscle imbalance. Baseline: flattened lumbar lordosis and increased upper thoracic kyphosis Goal status: INITIAL  5.  Patient will demonstrate full pain free cervical ROM for safety with driving.  Baseline: see cervical ROM tables above Goal status: INITIAL  6.  Patient will demonstrate full pain free lumbar ROM to perform ADLs.   Baseline: see lumbar ROM table above Goal status: INITIAL  8.  Patient will report </= 20% on Quick Dash to demonstrate improved functional ability.  Baseline: 40.9 / 100 = 40.9 % Goal status: INITIAL   9.  Patient will report </= 60% on LEFS  to demonstrate improved functional ability.  Baseline: 38.8% Goal status: INITIAL  10.  Patient to report ability to perform ADLs, household, and work-related tasks without limitation due to cervical/lumbar/LLE pain, LOM or weakness Baseline: in pain most of the time with chores Goal status: INITIAL   11.  Patient will tolerate 1hr of (standing/sitting/walking) to perform shopping. Baseline: can stand 1 hr, but in pain Goal status: INITIAL  12.  Patient will report centralization of radicular symptoms.  Baseline: pain runs down the LLE to the knee level Goal status: INITIAL PLAN:  PT FREQUENCY: 1-2x/week  PT DURATION: 8 weeks  PLANNED INTERVENTIONS: 97164- PT Re-evaluation, 97750- Physical Performance Testing, 97110-Therapeutic exercises, 97530- Therapeutic activity, [id number]- Neuromuscular re-education, 97535- Self Care, [id number]- Manual therapy,  [id number]- Electrical stimulation (unattended), 97016- Vasopneumatic device, [id number]- Ultrasound, [id number]- Traction (mechanical), [id number]- Ionotophoresis 4mg /ml Dexamethasone , 79439 (1-2 muscles), 20561 (3+ muscles)- Dry Needling, Patient/Family education, Balance training, Stair training, Taping, Joint mobilization, Spinal mobilization, Cryotherapy, and Moist heat  PLAN FOR NEXT SESSION: assess how piriformis stretching impacted pain; see if STM helped; continue hip strengthening; TRX activities?    Anjani Feuerborn L Jayme Cham, PTA 02/21/2024, 3:12 PM

## 2024-02-22 ENCOUNTER — Ambulatory Visit: Payer: Self-pay | Admitting: Medical-Surgical

## 2024-02-22 NOTE — Telephone Encounter (Signed)
 Called radiology reading room - requested x-rays be moved up for reading.

## 2024-02-27 ENCOUNTER — Ambulatory Visit: Attending: Medical-Surgical | Admitting: Rehabilitation

## 2024-02-27 DIAGNOSIS — M25561 Pain in right knee: Secondary | ICD-10-CM | POA: Diagnosis not present

## 2024-02-27 DIAGNOSIS — M25661 Stiffness of right knee, not elsewhere classified: Secondary | ICD-10-CM | POA: Diagnosis not present

## 2024-02-27 DIAGNOSIS — R600 Localized edema: Secondary | ICD-10-CM | POA: Diagnosis not present

## 2024-02-27 DIAGNOSIS — M79605 Pain in left leg: Secondary | ICD-10-CM | POA: Insufficient documentation

## 2024-02-27 DIAGNOSIS — M542 Cervicalgia: Secondary | ICD-10-CM | POA: Insufficient documentation

## 2024-02-27 DIAGNOSIS — M6281 Muscle weakness (generalized): Secondary | ICD-10-CM | POA: Diagnosis not present

## 2024-02-27 DIAGNOSIS — R2689 Other abnormalities of gait and mobility: Secondary | ICD-10-CM | POA: Insufficient documentation

## 2024-02-27 DIAGNOSIS — R262 Difficulty in walking, not elsewhere classified: Secondary | ICD-10-CM | POA: Diagnosis not present

## 2024-02-27 NOTE — Therapy (Signed)
 OUTPATIENT PHYSICAL THERAPY CERVICAL AND THORACOLUMBAR TREATMENT   Patient Name: Jean Hopkins MRN: [id number] DOB:[birth year], 68 y.o., female Today's Date: 02/14/2024   END OF SESSION:  PT End of Session - 02/27/24 1034     Visit Number 4    PT Start Time 1016    PT Stop Time 1105    PT Time Calculation (min) 49 min    Activity Tolerance Patient tolerated treatment well    Behavior During Therapy WFL for tasks assessed/performed            Past Medical History:  Diagnosis Date   AF (atrial fibrillation) (HCC)    hyperthyroid induced   Allergy    Anemia    Arthritis 11/2020   Right knee   Blood transfusion without reported diagnosis    Cancer (HCC)    Melanoma x2   Cataract    Complication of anesthesia    Slow to wake up with general anesthesia   Controlled type 2 diabetes mellitus without complication, without long-term current use of insulin (HCC) 02/23/2022   no meds   GERD (gastroesophageal reflux disease)    Hypercholesterolemia 08/25/2022   Hypertension    Hyperthyroidism    Neuromuscular disorder (HCC)    neurpathy feet   Pneumonia    Past Surgical History:  Procedure Laterality Date   ABDOMINAL HYSTERECTOMY  2007   CHOLECYSTECTOMY  1988   TOTAL ABDOMINAL HYSTERECTOMY     TOTAL KNEE ARTHROPLASTY Right 09/13/2023   Procedure: ARTHROPLASTY, KNEE, TOTAL;  Surgeon: Ernie Cough, MD;  Location: [facility];  Service: Orthopedics;  Laterality: Right;   Patient Active Problem List   Diagnosis Date Noted   S/P total knee arthroplasty, right 09/13/2023   Postmenopausal 08/15/2023   Hypercholesterolemia 08/25/2022   Personal history of malignant melanoma of skin 08/25/2022   Vitamin D  deficiency 08/25/2022   Controlled type 2 diabetes mellitus without complication, without long-term current use of insulin (HCC) 02/23/2022   Hypertension 02/02/2021   Melanoma of skin (HCC) 02/02/2021   Raynaud's phenomenon 02/02/2021   Gastroesophageal reflux disease without  esophagitis 01/14/2021   Osteoarthritis of right knee 01/04/2021   Hyperthyroidism    Atrial fibrillation (HCC) 07/29/2017    PCP: Willo Mini, NP   REFERRING PROVIDER: Willo Mini, NP   REFERRING DIAG: R20.2 (ICD-10-CM) - Paresthesia of left arm M25.552 (ICD-10-CM) - Left hip pain  THERAPY DIAG:  Muscle weakness (generalized)  Difficulty in walking, not elsewhere classified  Pain in left leg  RATIONALE FOR EVALUATION AND TREATMENT: Rehabilitation  ONSET DATE: 2 weeks ago LLE radicular pain  NEXT MD VISIT:    SUBJECTIVE:  SUBJECTIVE STATEMENT: Pt reports her exercises doing well, doing everyday, neck pain almost resolved, hip still bothering her.  68 y/o female referred to PT from PCP for new onset low back and LLE pain as well as LUE paresthesia.  LBP/LLE pain started 2 weeks ago for no apparent reason.  She reports numbness in the L arm and pain/aching from posterior L hip down the entire front and back of the LLE.  Prednisone  helped some.   She was requiring assistance to ambulate prior to taking the prednisone .  She is ambulating with no device now.  Taking Celebrex  again but was taking before. Neck pain:  states had some tingling feelings running from lower cspine up toward posterior skull last few months for no apparent reason.  Started having tingling/numbness in the LUE x 1 week that is all the way into her hand.   PAIN: Are you having pain? Yes: NPRS scale: 2/10 sitting still now;  8/10 worst Pain location: R hip and neck  Pain description: aching/radiating Aggravating factors: moving and walking a lot;  sitting with LLE in dependent positions Relieving factors: sitting in recliner with LLE elevated  PERTINENT HISTORY:  Afib, HTN, anemia, OA, diabetes, neuropathy, recent  R TKA 3/25,   PRECAUTIONS: None  HAND DOMINANCE: Right  RED FLAGS: None  WEIGHT BEARING RESTRICTIONS: No  FALLS:  Has patient fallen in last 6 months? No  LIVING ENVIRONMENT: Lives with: lives with their family and lives with their spouse Lives in: House/apartment Stairs: Yes: External: 1 steps; none Has following equipment at home: Single point cane and Environmental consultant - 2 wheeled  OCCUPATION: retired Charity fundraiser, Stage manager for [employer]  PLOF: Independent with gait  PATIENT GOALS: not hurt in my neck and LLE    OBJECTIVE:   DIAGNOSTIC FINDINGS:  lumbar and cervical Xray results are pending in the system (done 02/11/24)  PATIENT SURVEYS:  Quick Dash = 40.9 / 100 = 40.9 %     LEFS =  31 / 80 = 38.8 % SCREENING FOR RED FLAGS: Bowel or bladder incontinence: No Spinal tumors: No Cauda equina syndrome: No Compression fracture: No Abdominal aneurysm: No  COGNITION: Overall cognitive status: Within functional limits for tasks assessed     SENSATION: WFL  POSTURE:  rounded shoulders, forward head, and increased thoracic kyphosis/upper thoracic; no scoliosis noted; very flattened lumbar lordosis and not much lower to mid thoracic kyphosis  PALPATION: TTP over the L cervical spine, UT, and levator;  TTP over the L piriformis and hamstrings  CERVICAL ROM:   Active ROM Eval  Flexion 100%  Extension 60%  Right lateral flexion 50%  Left lateral flexion 50%  Right rotation 75%  Left rotation 60%; p!     Repeated motions do not impact her pain level or LUE parasthesia  UPPER EXTREMITY ROM:  Active ROM Right eval Left eval  Shoulder flexion    Shoulder extension    Shoulder abduction    Shoulder adduction    Shoulder internal rotation    Shoulder external rotation    Elbow flexion    Elbow extension    Wrist flexion    Wrist extension    Wrist ulnar deviation    Wrist radial deviation    Wrist pronation    Wrist supination      (Blank rows = not  tested)  UPPER EXTREMITY MMT:  MMT Right eval Left eval  Shoulder flexion 180 180  Shoulder extension    Shoulder abduction  Shoulder adduction    Shoulder internal rotation    Shoulder external rotation    Middle trapezius    Lower trapezius    Elbow flexion    Elbow extension    Wrist flexion    Wrist extension    Wrist ulnar deviation    Wrist radial deviation    Wrist pronation    Wrist supination    Grip strength    (Blank rows = not tested)  LUMBAR ROM:   Active  Eval  Flexion To midshins  Extension 65%; some endrange p!  Right lateral flexion To knee joint  Left lateral flexion Just above knee joint; p!  Right rotation 75%  Left rotation 60%; p!    (Blank rows = not tested)  MUSCLE LENGTH: Hamstrings: Right SLR 80 deg; Left SLR 70 deg Thomas test: Right 0 deg; Left 0 deg Hamstrings: 90/90 is 20 deg L; 30 deg R ITB: WNL BLE Piriformis: tight on L   LOWER EXTREMITY ROM:     Active  Right eval Left eval  Hip flexion    Hip extension    Hip abduction    Hip adduction    Hip internal rotation 30 20  Hip external rotation 40 35  Knee flexion    Knee extension    Ankle dorsiflexion    Ankle plantarflexion    Ankle inversion    Ankle eversion     (Blank rows = not tested)  LOWER EXTREMITY MMT:    MMT Right eval Left eval  Hip flexion 5 4  Hip extension    Hip abduction 4 4+  Hip adduction    Hip internal rotation 4 4-  Hip external rotation 4+ 4+  Knee flexion 5 5  Knee extension 5 5  Ankle dorsiflexion    Ankle plantarflexion 5 4  Ankle inversion    Ankle eversion      (Blank rows = not tested)  CERVICAL SPECIAL TESTS:  Upper limb tension test (ULTT): Negative, Spurling's test: Negative, and Distraction test: Positive;  Adson's test is negative LUE; ULTT negative LUE  LUMBAR SPECIAL TESTS:  Straight leg raise test: Negative, Slump test: Positive, SI Compression/distraction test: Negative, and FABER test: Positive;  LLE may be a little  shorter in supine  FUNCTIONAL TESTS:  TBD  GAIT: Distance walked: into clinic distance Assistive device utilized: None Level of assistance: Complete Independence Gait pattern: decreased arm swing- Right, decreased arm swing- Left, decreased step length- Right, and decreased step length- Left Comments: slight antalgic LLE, supinates both feet throughout gait cycle   TODAY'S TREATMENT:  02/27/24 NuStep L5 x 6' Cross over piriformis stretch x 1' x 2 LLE SL hip abd x 2/10 BLE  THERAPEUTIC ACTIVITIES: To improve functional performance.  Demonstration, verbal and tactile cues throughout for technique. SKTC strap stretch w/ 5 sec holds x 10 BLE DKTC stretch 5 sec holds x 10 BLE Abd crunch sliding fingers up thighs x 2/10 Manual SLR ham stretch x 1' x 2 BLE  NEUROMUSCULAR RE-EDUCATION: To improve posture and proprioception. Single leg bridge on peanut ball w/ TA x 2/10 LLE Side 1/2 plank from knees x 2/5 L TRX lateral lunges w/ TA x 10 B   MANUAL THERAPY: To promote reduced pain utilizing STM. STM of L piriformis and buttock with theragun and manually with deep pressure to mid piriformis belly  02/21/24 THERAPEUTIC EXERCISE: To improve strength.  Demonstration, verbal and tactile cues throughout for technique. Bike partial RPM x 8' Bridges  x 20 S/L hip abduction x 20 B Supne KTOS and figure4 stretch 1 min each L side  THERAPEUTIC ACTIVITIES: To improve functional performance.  Demonstration, verbal and tactile cues throughout for technique. Standing hip ABD 3# x 20 LLE Standing hip ext 3# x 20 LLE Standing hip ABD to ext x 20 LLE  Standing marching 3# x 20 BLE Supine LTR x 20 Supine FABER piriformis stretch   MANUAL THERAPY: To promote reduced pain utilizing STM. STM of L piriformis, glutes, hamstrings with theragun  02/18/24 THERAPEUTIC EXERCISE: To improve strength.  Demonstration, verbal and tactile cues throughout for technique. Bike partial RPM x 6'  THERAPEUTIC  ACTIVITIES: To improve functional performance.  Demonstration, verbal and tactile cues throughout for technique. Standing hip ABD 3# x 20 LLE Standing hip ext 3# x 20 LLE Counter back ext x 15 Supine LTR x 15 Supine FABER piriformis stretch TRX lateral lunge x 10 BLE  NEUROMUSCULAR RE-EDUCATION: To improve kinesthesia and posture.with transverse abdominal contractions on all exercises Supine TA contractions x 15 Supine Peanut rollouts/TA x 20 Supine Ball S/S with TA x 20 BLE Supine bridge/TA x 20 BLE  MANUAL THERAPY: To promote reduced pain utilizing STM. STM of L piriformis and buttock with theragun   02/14/24 SELF CARE: Provided education on PT POC progression and differential dx of cervical/lumbar nerve compression at level of spine vs. Muscular compression peripherally and difficult telling which based on today's evaluation;  initial HEP.  PATIENT EDUCATION:  Education details: adding knee to chest and sideplank to HEP  Person educated: Patient Education method: Explanation, Demonstration, Verbal cues, Tactile cues, and Handouts Education comprehension: verbalized understanding, verbal cues required, tactile cues required, and needs further education  HOME EXERCISE PROGRAM: Access Code: [email or web address] URL: [email or web address] Date: 02/27/2024 Prepared by: Garnette Montclair  Exercises - Hooklying Single Knee to Chest Stretch  - 1 x daily - 7 x weekly - 1 sets - 10 reps - 3-5 sec hold - Supine Double Knee to Chest  - 1 x daily - 7 x weekly - 1 sets - 10 reps - 3-5 sec hold - Single Leg Bridge  - 1 x daily - 7 x weekly - 3 sets - 10 reps - Sidelying Hip Abduction  - 1 x daily - 7 x weekly - 2 sets - 10 reps - Side Plank on Knees  - 1 x daily - 7 x weekly - 2 sets - 5 reps - 1-2 sec hold - Seated Hamstring Stretch  - 1 x daily - 7 x weekly - 1 sets - 2 reps - Standing Lumbar Extension  - 1 x daily - 7 x weekly - 1 sets - 10 reps - Seated Cervical Extension AROM  - 1  x daily - 7 x weekly - 3 sets - 10 reps - Seated Cervical Retraction  - 1 x daily - 7 x weekly - 3 sets - 10 reps - Supine Figure 4 Piriformis Stretch  - 1 x daily - 7 x weekly - 1 sets - 2 reps - 1 min hold - Hip Abduction with Resistance Loop  - 1 x daily - 7 x weekly - 3 sets - 10 reps - Hip Extension with Resistance Loop  - 1 x daily - 7 x weekly - 3 sets - 10 reps - Standing Marching  - 1 x daily - 7 x weekly - 3 sets - 10 reps  ASSESSMENT:  CLINICAL IMPRESSION: Patient reports she is doing her HEP regularly, but  not seeing much improvement in her pain.  States she even had some pain, possibly radicular, in her L lateral calf over the weekend.  She states it did not feel muscular in nature.  She is still point tender over the piriformis area and states the manual therapy is helping with this.  Her HEP and strengthening program is advanced today and switched to more flexion based exercises.   Will see how this affects.  PT remains necessary for pain, weakness, ROM restrictions and deficits.  Continue per POC  Jean Hopkins is a 68 y.o. female who was referred to physical therapy for evaluation and treatment for R20.2 (ICD-10-CM) - Paresthesia of left arm M25.552 (ICD-10-CM) - Left hip pain.     Patient reports onset of LLE pain beginning 2 weeks ago. Pain is worse with prolonged activities with arms or legs.   Standing or being active worsens.   Sitting reclined helps.  Patient has deficits in lumbar and cervical ROM, L hip and L shoulder flexibility, LLE strength, abnormal cervical and lumbar posture, and TTP with abnormal muscle tension in the paracervicals and L hip which are interfering with ADLs and are impacting quality of life.  On QuickDash patient scored 40.9/50 demonstrating 40.9% or moderate disability.  On LEFS patient scored 31/80 demonstrating  31 / 80 = 38.8 % or moderate disability.  Jean Hopkins will benefit from skilled PT to address above deficits to improve mobility and activity tolerance  with decreased pain interference.  OBJECTIVE IMPAIRMENTS: difficulty walking, decreased ROM, decreased strength, and pain.   ACTIVITY LIMITATIONS: lifting, bending, and locomotion level  PARTICIPATION LIMITATIONS: meal prep, cleaning, laundry, shopping, and community activity  PERSONAL FACTORS: Age and 1-2 comorbidities: Afib, HTN, anemia, OA, diabetes, neuropathy, recent R TKA 3/25,  are also affecting patient's functional outcome.   REHAB POTENTIAL: Good  CLINICAL DECISION MAKING: Evolving/moderate complexity  EVALUATION COMPLEXITY: Moderate   GOALS: Goals reviewed with patient? Yes  SHORT TERM GOALS: Target date: 03/14/2024   Patient will be independent with initial HEP to improve outcomes and carryover.  Baseline: 100% PT assist required for correct completion 02/18/24 added to HEP;  9/2:  can return demo initial HEP Goal status: MET  2.  Patient will report 25% improvement in neck and  back pain to improve QOL. Baseline: 8/10 worst;  9/2:  0/10 today;  7/10 worst Goal status: MET  LONG TERM GOALS: Target date: 1017/25  Patient will be independent with ongoing/advanced HEP for self-management at home.  Baseline: no advanced HEP yet 02/18/24 advancing HEP Goal status: IN PROGRESS  2.  Patient will report 50-75% improvement in neck and  back pain to improve QOL.  Baseline: 8/10 worst;  9/2:  7/10 worst over the weekend Goal status: IN PROGRESS  3.  Patient will demonstrate improved posture to decrease muscle imbalance. Baseline: flattened lumbar lordosis and increased upper thoracic kyphosis Goal status: INITIAL  5.  Patient will demonstrate full pain free cervical ROM for safety with driving.  Baseline: see cervical ROM tables above Goal status: INITIAL  6.  Patient will demonstrate full pain free lumbar ROM to perform ADLs.   Baseline: see lumbar ROM table above Goal status: INITIAL  8.  Patient will report </= 20% on Quick Dash to demonstrate improved  functional ability.  Baseline: 40.9 / 100 = 40.9 % Goal status: INITIAL   9.  Patient will report </= 60% on LEFS  to demonstrate improved functional ability.  Baseline: 38.8% Goal status: INITIAL  10.  Patient to report ability to perform ADLs, household, and work-related tasks without limitation due to cervical/lumbar/LLE pain, LOM or weakness Baseline: in pain most of the time with chores Goal status: INITIAL   11.  Patient will tolerate 1hr of (standing/sitting/walking) to perform shopping. Baseline: can stand 1 hr, but in pain Goal status: INITIAL  12.  Patient will report centralization of radicular symptoms.  Baseline: pain runs down the LLE to the knee level Goal status: INITIAL PLAN:  PT FREQUENCY: 1-2x/week  PT DURATION: 8 weeks  PLANNED INTERVENTIONS: 97164- PT Re-evaluation, 97750- Physical Performance Testing, 97110-Therapeutic exercises, 97530- Therapeutic activity, 97112- Neuromuscular re-education, 97535- Self Care, [id number]- Manual therapy, [id number]- Electrical stimulation (unattended), 97016- Vasopneumatic device, [id number]- Ultrasound, [id number]- Traction (mechanical), [id number]- Ionotophoresis 4mg /ml Dexamethasone , 79439 (1-2 muscles), 20561 (3+ muscles)- Dry Needling, Patient/Family education, Balance training, Stair training, Taping, Joint mobilization, Spinal mobilization, Cryotherapy, and Moist heat  PLAN FOR NEXT SESSION:   Assess how knee to chest and planks did; Continue TRX; ?step ups   Jean Hopkins, PT 02/27/2024, 12:37 PM

## 2024-02-29 ENCOUNTER — Encounter: Payer: Self-pay | Admitting: Rehabilitation

## 2024-02-29 ENCOUNTER — Ambulatory Visit: Admitting: Rehabilitation

## 2024-02-29 DIAGNOSIS — R2689 Other abnormalities of gait and mobility: Secondary | ICD-10-CM | POA: Diagnosis not present

## 2024-02-29 DIAGNOSIS — M542 Cervicalgia: Secondary | ICD-10-CM | POA: Diagnosis not present

## 2024-02-29 DIAGNOSIS — M6281 Muscle weakness (generalized): Secondary | ICD-10-CM | POA: Diagnosis not present

## 2024-02-29 DIAGNOSIS — R262 Difficulty in walking, not elsewhere classified: Secondary | ICD-10-CM | POA: Diagnosis not present

## 2024-02-29 DIAGNOSIS — M25561 Pain in right knee: Secondary | ICD-10-CM | POA: Diagnosis not present

## 2024-02-29 DIAGNOSIS — M79605 Pain in left leg: Secondary | ICD-10-CM | POA: Diagnosis not present

## 2024-02-29 DIAGNOSIS — R600 Localized edema: Secondary | ICD-10-CM | POA: Diagnosis not present

## 2024-02-29 DIAGNOSIS — M25661 Stiffness of right knee, not elsewhere classified: Secondary | ICD-10-CM | POA: Diagnosis not present

## 2024-02-29 NOTE — Therapy (Signed)
 OUTPATIENT PHYSICAL THERAPY CERVICAL AND THORACOLUMBAR TREATMENT   Patient Name: Jean Hopkins MRN: [id number] DOB:Jun 22, 1956, 68 y.o., female Today's Date: 02/14/2024   END OF SESSION:  PT End of Session - 02/29/24 0935     Visit Number 5    PT Start Time 0931    PT Stop Time 1015    PT Time Calculation (min) 44 min    Activity Tolerance Patient tolerated treatment well    Behavior During Therapy [facility] for tasks assessed/performed            Past Medical History:  Diagnosis Date   AF (atrial fibrillation) (HCC)    hyperthyroid induced   Allergy    Anemia    Arthritis 11/2020   Right knee   Blood transfusion without reported diagnosis    Cancer (HCC)    Melanoma x2   Cataract    Complication of anesthesia    Slow to wake up with general anesthesia   Controlled type 2 diabetes mellitus without complication, without long-term current use of insulin (HCC) 02/23/2022   no meds   GERD (gastroesophageal reflux disease)    Hypercholesterolemia 08/25/2022   Hypertension    Hyperthyroidism    Neuromuscular disorder (HCC)    neurpathy feet   Pneumonia    Past Surgical History:  Procedure Laterality Date   ABDOMINAL HYSTERECTOMY  2007   CHOLECYSTECTOMY  1988   TOTAL ABDOMINAL HYSTERECTOMY     TOTAL KNEE ARTHROPLASTY Right 09/13/2023   Procedure: ARTHROPLASTY, KNEE, TOTAL;  Surgeon: Ernie Cough, MD;  Location: [facility];  Service: Orthopedics;  Laterality: Right;   Patient Active Problem List   Diagnosis Date Noted   S/P total knee arthroplasty, right 09/13/2023   Postmenopausal 08/15/2023   Hypercholesterolemia 08/25/2022   Personal history of malignant melanoma of skin 08/25/2022   Vitamin D  deficiency 08/25/2022   Controlled type 2 diabetes mellitus without complication, without long-term current use of insulin (HCC) 02/23/2022   Hypertension 02/02/2021   Melanoma of skin (HCC) 02/02/2021   Raynaud's phenomenon 02/02/2021   Gastroesophageal reflux disease without  esophagitis 01/14/2021   Osteoarthritis of right knee 01/04/2021   Hyperthyroidism    Atrial fibrillation (HCC) 07/29/2017    PCP: Willo Mini, NP   REFERRING PROVIDER: Willo Mini, NP   REFERRING DIAG: R20.2 (ICD-10-CM) - Paresthesia of left arm M25.552 (ICD-10-CM) - Left hip pain  THERAPY DIAG:  Muscle weakness (generalized)  Difficulty in walking, not elsewhere classified  RATIONALE FOR EVALUATION AND TREATMENT: Rehabilitation  ONSET DATE: 2 weeks ago LLE radicular pain  NEXT MD VISIT:    SUBJECTIVE:  SUBJECTIVE STATEMENT: Pt. Reports only occasional LUE tingling now that is not too bothersome.  Also getting occasional central to L LBP.   However, Main c/o is the L central hip pain and pain into the posterolateral thigh.   States hasn't had any funny feelings in the L calf since last visit, but states the LLE just feels weak at times like it might give out.   She denies any falls though.    68 y/o female referred to PT from PCP for new onset low back and LLE pain as well as LUE paresthesia.  LBP/LLE pain started 2 weeks ago for no apparent reason.  She reports numbness in the L arm and pain/aching from posterior L hip down the entire front and back of the LLE.  Prednisone  helped some.   She was requiring assistance to ambulate prior to taking the prednisone .  She is ambulating with no device now.  Taking Celebrex  again but was taking before. Neck pain:  states had some tingling feelings running from lower cspine up toward posterior skull last few months for no apparent reason.  Started having tingling/numbness in the LUE x 1 week that is all the way into her hand.   PAIN: Are you having pain? Yes: NPRS scale: 2/10 sitting still now;  8/10 worst Pain location: R hip and neck  Pain  description: aching/radiating Aggravating factors: moving and walking a lot;  sitting with LLE in dependent positions Relieving factors: sitting in recliner with LLE elevated  PERTINENT HISTORY:  Afib, HTN, anemia, OA, diabetes, neuropathy, recent R TKA 3/25,   PRECAUTIONS: None  HAND DOMINANCE: Right  RED FLAGS: None  WEIGHT BEARING RESTRICTIONS: No  FALLS:  Has patient fallen in last 6 months? No  LIVING ENVIRONMENT: Lives with: lives with their family and lives with their spouse Lives in: House/apartment Stairs: Yes: External: 1 steps; none Has following equipment at home: Single point cane and Environmental consultant - 2 wheeled  OCCUPATION: retired Charity fundraiser, Stage manager for [employer]  PLOF: Independent with gait  PATIENT GOALS: not hurt in my neck and LLE    OBJECTIVE:   DIAGNOSTIC FINDINGS:  lumbar and cervical Xray results are pending in the system (done 02/11/24)  PATIENT SURVEYS:  Quick Dash = 40.9 / 100 = 40.9 %     LEFS =  31 / 80 = 38.8 % SCREENING FOR RED FLAGS: Bowel or bladder incontinence: No Spinal tumors: No Cauda equina syndrome: No Compression fracture: No Abdominal aneurysm: No  COGNITION: Overall cognitive status: Within functional limits for tasks assessed     SENSATION: WFL  POSTURE:  rounded shoulders, forward head, and increased thoracic kyphosis/upper thoracic; no scoliosis noted; very flattened lumbar lordosis and not much lower to mid thoracic kyphosis  PALPATION: TTP over the L cervical spine, UT, and levator;  TTP over the L piriformis and hamstrings  CERVICAL ROM:   Active ROM Eval  Flexion 100%  Extension 60%  Right lateral flexion 50%  Left lateral flexion 50%  Right rotation 75%  Left rotation 60%; p!     Repeated motions do not impact her pain level or LUE parasthesia  UPPER EXTREMITY ROM:  Active ROM Right eval Left eval  Shoulder flexion    Shoulder extension    Shoulder abduction    Shoulder adduction     Shoulder internal rotation    Shoulder external rotation    Elbow flexion    Elbow extension    Wrist flexion  Wrist extension    Wrist ulnar deviation    Wrist radial deviation    Wrist pronation    Wrist supination      (Blank rows = not tested)  UPPER EXTREMITY MMT:  MMT Right eval Left eval  Shoulder flexion 180 180  Shoulder extension    Shoulder abduction    Shoulder adduction    Shoulder internal rotation    Shoulder external rotation    Middle trapezius    Lower trapezius    Elbow flexion    Elbow extension    Wrist flexion    Wrist extension    Wrist ulnar deviation    Wrist radial deviation    Wrist pronation    Wrist supination    Grip strength    (Blank rows = not tested)  LUMBAR ROM:   Active  Eval  Flexion To midshins  Extension 65%; some endrange p!  Right lateral flexion To knee joint  Left lateral flexion Just above knee joint; p!  Right rotation 75%  Left rotation 60%; p!    (Blank rows = not tested)  MUSCLE LENGTH: Hamstrings: Right SLR 80 deg; Left SLR 70 deg Thomas test: Right 0 deg; Left 0 deg Hamstrings: 90/90 is 20 deg L; 30 deg R ITB: WNL BLE Piriformis: tight on L   LOWER EXTREMITY ROM:     Active  Right eval Left eval  Hip flexion    Hip extension    Hip abduction    Hip adduction    Hip internal rotation 30 20  Hip external rotation 40 35  Knee flexion    Knee extension    Ankle dorsiflexion    Ankle plantarflexion    Ankle inversion    Ankle eversion     (Blank rows = not tested)  LOWER EXTREMITY MMT:    MMT Right eval Left eval RLE 02/29/24 LLE 02/29/24        Hip flexion 5 4 5 5   Hip extension      Hip abduction 4 4+ 4+ 4-  Hip adduction      Hip internal rotation 4 4- 4 4-  Hip external rotation 4+ 4+ 4+ 4+  Knee flexion 5 5 5 5   Knee extension 5 5 5 5   Ankle dorsiflexion   5 5  Ankle plantarflexion 5 4 4 4   Ankle inversion      Ankle eversion        (Blank rows = not tested)  CERVICAL SPECIAL  TESTS:  Upper limb tension test (ULTT): Negative, Spurling's test: Negative, and Distraction test: Positive;  Adson's test is negative LUE; ULTT negative LUE  LUMBAR SPECIAL TESTS:  Straight leg raise test: Negative, Slump test: Positive, SI Compression/distraction test: Negative, and FABER test: Positive;  LLE may be a little shorter in supine  FUNCTIONAL TESTS:  TBD  GAIT: Distance walked: into clinic distance Assistive device utilized: None Level of assistance: Complete Independence Gait pattern: decreased arm swing- Right, decreased arm swing- Left, decreased step length- Right, and decreased step length- Left Comments: slight antalgic LLE, supinates both feet throughout gait cycle   TODAY'S TREATMENT:  02/29/24 Bike partial ROM due to TKA LLE x 6' FABER piriformis stretch x 1' LLE Seated hamstring stretch x 1' x 3 BLE Seated piriformis stretch x 1' LLE  THERAPEUTIC ACTIVITIES: To improve functional performance.  Demonstration, verbal and tactile cues throughout for technique. SKTC stretch x 10 x 10 sec holds BLE DKTC stretch w/ strap x 10  x 10 sec holds  Hooklying curl up x 2/10 POE x 1-2 min  NEUROMUSCULAR RE-EDUCATION: To improve balance and posture. Side 1/2 plank x 3/5 on L Prone 1/2 plank x 3/5 Quadruped hip ext x 2/5 BLE with facilitation to maintain flat/still back position  MANUAL THERAPY: To promote reduced pain utilizing manual TP therapy and myofascial release. Deep pressure release to L piriformis Tp manually;   theragun to L hip in general with focus on piriformis  02/27/24 NuStep L5 x 6' Cross over piriformis stretch x 1' x 2 LLE SL hip abd x 2/10 BLE  THERAPEUTIC ACTIVITIES: To improve functional performance.  Demonstration, verbal and tactile cues throughout for technique. SKTC strap stretch w/ 5 sec holds x 10 BLE DKTC stretch 5 sec holds x 10 BLE Abd crunch sliding fingers up thighs x 2/10 Manual SLR ham stretch x 1' x 2 BLE  NEUROMUSCULAR  RE-EDUCATION: To improve posture and proprioception. Single leg bridge on peanut ball w/ TA x 2/10 LLE Side 1/2 plank from knees x 2/5 L TRX lateral lunges w/ TA x 10 B   MANUAL THERAPY: To promote reduced pain utilizing STM. STM of L piriformis and buttock with theragun and manually with deep pressure to mid piriformis belly  02/21/24 THERAPEUTIC EXERCISE: To improve strength.  Demonstration, verbal and tactile cues throughout for technique. Bike partial RPM x 8' Bridges x 20 S/L hip abduction x 20 B Supne KTOS and figure4 stretch 1 min each L side  THERAPEUTIC ACTIVITIES: To improve functional performance.  Demonstration, verbal and tactile cues throughout for technique. Standing hip ABD 3# x 20 LLE Standing hip ext 3# x 20 LLE Standing hip ABD to ext x 20 LLE  Standing marching 3# x 20 BLE Supine LTR x 20 Supine FABER piriformis stretch   MANUAL THERAPY: To promote reduced pain utilizing STM. STM of L piriformis, glutes, hamstrings with theragun  02/18/24 THERAPEUTIC EXERCISE: To improve strength.  Demonstration, verbal and tactile cues throughout for technique. Bike partial RPM x 6'  THERAPEUTIC ACTIVITIES: To improve functional performance.  Demonstration, verbal and tactile cues throughout for technique. Standing hip ABD 3# x 20 LLE Standing hip ext 3# x 20 LLE Counter back ext x 15 Supine LTR x 15 Supine FABER piriformis stretch TRX lateral lunge x 10 BLE  NEUROMUSCULAR RE-EDUCATION: To improve kinesthesia and posture.with transverse abdominal contractions on all exercises Supine TA contractions x 15 Supine Peanut rollouts/TA x 20 Supine Ball S/S with TA x 20 BLE Supine bridge/TA x 20 BLE  MANUAL THERAPY: To promote reduced pain utilizing STM. STM of L piriformis and buttock with theragun   02/14/24 SELF CARE: Provided education on PT POC progression and differential dx of cervical/lumbar nerve compression at level of spine vs. Muscular compression peripherally  and difficult telling which based on today's evaluation;  initial HEP.  PATIENT EDUCATION:  Education details: review of knee to chest and sideplanks and adding prone 1/2 plank to HEP  Person educated: Patient Education method: Explanation, Demonstration, Verbal cues, Tactile cues, and Handouts Education comprehension: verbalized understanding, verbal cues required, tactile cues required, and needs further education  HOME EXERCISE PROGRAM: Access Code: [email or web address] URL: [email or web address] Date: 02/29/2024 Prepared by: Garnette Montclair  Exercises - Hooklying Single Knee to Chest Stretch  - 1 x daily - 7 x weekly - 1 sets - 10 reps - 3-5 sec hold - Supine Double Knee to Chest  - 1 x daily - 7 x weekly - 1 sets - 10  reps - 3-5 sec hold - Single Leg Bridge  - 1 x daily - 7 x weekly - 3 sets - 10 reps - Sidelying Hip Abduction  - 1 x daily - 7 x weekly - 2 sets - 10 reps - Side Plank on Knees  - 1 x daily - 7 x weekly - 2 sets - 5 reps - 1-2 sec hold - Plank on Knees  - 1 x daily - 7 x weekly - 3 sets - 10 reps - Quadruped Alternating Leg Extensions  - 1 x daily - 7 x weekly - 3 sets - 10 reps - Seated Hamstring Stretch  - 1 x daily - 7 x weekly - 1 sets - 2 reps - Seated Figure 4 Piriformis Stretch  - 1 x daily - 7 x weekly - 3 sets - 10 reps - Hip Abduction with Resistance Loop  - 1 x daily - 7 x weekly - 3 sets - 10 reps - Hip Extension with Resistance Loop  - 1 x daily - 7 x weekly - 3 sets - 10 reps - Seated Cervical Retraction  - 1 x daily - 7 x weekly - 3 sets - 10 reps - Seated Cervical Extension AROM  - 1 x daily - 7 x weekly - 3 sets - 10 reps - Standing Lumbar Extension  - 1 x daily - 7 x weekly - 1 sets - 10 reps ASSESSMENT:  CLINICAL IMPRESSION: Flexion based exercises do not seem to be worsening the patient's pain.  States felt more weak in the LLE in general since last visit.  Her hip abduction strength and plantar flexion strength are slightly declined, but  all else seems to be unchanged.  Will continue to monitor this.  1/2 planks are very difficult which highlights hip and core weakness.   Continued PT is necessary for addressing the L hip pain, L hip/calf weakness, decreased lumbar stabilization ability.   Continue per POC  Jean Hopkins is a 68 y.o. female who was referred to physical therapy for evaluation and treatment for R20.2 (ICD-10-CM) - Paresthesia of left arm M25.552 (ICD-10-CM) - Left hip pain.     Patient reports onset of LLE pain beginning 2 weeks ago. Pain is worse with prolonged activities with arms or legs.   Standing or being active worsens.   Sitting reclined helps.  Patient has deficits in lumbar and cervical ROM, L hip and L shoulder flexibility, LLE strength, abnormal cervical and lumbar posture, and TTP with abnormal muscle tension in the paracervicals and L hip which are interfering with ADLs and are impacting quality of life.  On QuickDash patient scored 40.9/50 demonstrating 40.9% or moderate disability.  On LEFS patient scored 31/80 demonstrating  31 / 80 = 38.8 % or moderate disability.  [place]ia will benefit from skilled PT to address above deficits to improve mobility and activity tolerance with decreased pain interference.  OBJECTIVE IMPAIRMENTS: difficulty walking, decreased ROM, decreased strength, and pain.   ACTIVITY LIMITATIONS: lifting, bending, and locomotion level  PARTICIPATION LIMITATIONS: meal prep, cleaning, laundry, shopping, and community activity  PERSONAL FACTORS: Age and 1-2 comorbidities: Afib, HTN, anemia, OA, diabetes, neuropathy, recent R TKA 3/25,  are also affecting patient's functional outcome.   REHAB POTENTIAL: Good  CLINICAL DECISION MAKING: Evolving/moderate complexity  EVALUATION COMPLEXITY: Moderate   GOALS: Goals reviewed with patient? Yes  SHORT TERM GOALS: Target date: 03/14/2024   Patient will be independent with initial HEP to improve outcomes and carryover.  Baseline: 100% PT  assist required for correct completion 02/18/24 added to HEP;  9/2:  can return demo initial HEP Goal status: MET  2.  Patient will report 25% improvement in neck and  back pain to improve QOL. Baseline: 8/10 worst;  9/2:  0/10 today;  7/10 worst Goal status: MET  LONG TERM GOALS: Target date: 1017/25  Patient will be independent with ongoing/advanced HEP for self-management at home.  Baseline: no advanced HEP yet 02/18/24 advancing HEP Goal status: IN PROGRESS  2.  Patient will report 50-75% improvement in neck and  back pain to improve QOL.  Baseline: 8/10 worst;  9/2:  7/10 worst over the weekend Goal status: IN PROGRESS  3.  Patient will demonstrate improved posture to decrease muscle imbalance. Baseline: flattened lumbar lordosis and increased upper thoracic kyphosis 9/5:  able to do POE today Goal status: IN PROGRESS   5.  Patient will demonstrate full pain free cervical ROM for safety with driving.  Baseline: see cervical ROM tables above Goal status: INITIAL  6.  Patient will demonstrate full pain free lumbar ROM to perform ADLs.   Baseline: see lumbar ROM table above Goal status: INITIAL  8.  Patient will report </= 20% on Quick Dash to demonstrate improved functional ability.  Baseline: 40.9 / 100 = 40.9 % Goal status: INITIAL   9.  Patient will report </= 60% on LEFS  to demonstrate improved functional ability.  Baseline: 38.8% Goal status: INITIAL  10.  Patient to report ability to perform ADLs, household, and work-related tasks without limitation due to cervical/lumbar/LLE pain, LOM or weakness Baseline: in pain most of the time with chores Goal status: INITIAL   11.  Patient will tolerate 1hr of (standing/sitting/walking) to perform shopping. Baseline: can stand 1 hr, but in pain Goal status: INITIAL  12.  Patient will report centralization of radicular symptoms.  Baseline: pain runs down the LLE to the knee level Goal status: INITIAL PLAN:  PT  FREQUENCY: 1-2x/week  PT DURATION: 8 weeks  PLANNED INTERVENTIONS: 02835- PT Re-evaluation, 97750- Physical Performance Testing, 97110-Therapeutic exercises, 97530- Therapeutic activity, [id number]- Neuromuscular re-education, 97535- Self Care, 02859- Manual therapy, [id number]- Electrical stimulation (unattended), [id number]- Vasopneumatic device, [id number]- Ultrasound, [id number]- Traction (mechanical), [id number]- Ionotophoresis 4mg /ml Dexamethasone , 79439 (1-2 muscles), 20561 (3+ muscles)- Dry Needling, Patient/Family education, Balance training, Stair training, Taping, Joint mobilization, Spinal mobilization, Cryotherapy, and Moist heat  PLAN FOR NEXT SESSION:   Continue with TRX lunges and add step ups;  MFR to L hip for pain relief;  reassess lumbar and cervical ROM and LEFS  Saniyya Gau, PT 02/29/2024, 12:18 PM

## 2024-03-03 ENCOUNTER — Encounter: Payer: Self-pay | Admitting: Rehabilitation

## 2024-03-03 ENCOUNTER — Ambulatory Visit

## 2024-03-03 DIAGNOSIS — R2689 Other abnormalities of gait and mobility: Secondary | ICD-10-CM | POA: Diagnosis not present

## 2024-03-03 DIAGNOSIS — M542 Cervicalgia: Secondary | ICD-10-CM

## 2024-03-03 DIAGNOSIS — M25561 Pain in right knee: Secondary | ICD-10-CM

## 2024-03-03 DIAGNOSIS — M25661 Stiffness of right knee, not elsewhere classified: Secondary | ICD-10-CM | POA: Diagnosis not present

## 2024-03-03 DIAGNOSIS — M6281 Muscle weakness (generalized): Secondary | ICD-10-CM | POA: Diagnosis not present

## 2024-03-03 DIAGNOSIS — M79605 Pain in left leg: Secondary | ICD-10-CM

## 2024-03-03 DIAGNOSIS — R600 Localized edema: Secondary | ICD-10-CM | POA: Diagnosis not present

## 2024-03-03 DIAGNOSIS — R262 Difficulty in walking, not elsewhere classified: Secondary | ICD-10-CM

## 2024-03-03 NOTE — Therapy (Signed)
 OUTPATIENT PHYSICAL THERAPY CERVICAL AND THORACOLUMBAR TREATMENT   Patient Name: Jean Hopkins MRN: [id number] DOB:04/23/1956, 68 y.o., female Today's Date: 02/14/2024   END OF SESSION:  PT End of Session - 03/03/24 1438     Visit Number 6    Authorization Type [employer]    PT Start Time 1359    PT Stop Time 1441    PT Time Calculation (min) 42 min    Activity Tolerance Patient tolerated treatment well    Behavior During Therapy WFL for tasks assessed/performed             Past Medical History:  Diagnosis Date   AF (atrial fibrillation) (HCC)    hyperthyroid induced   Allergy    Anemia    Arthritis 11/2020   Right knee   Blood transfusion without reported diagnosis    Cancer (HCC)    Melanoma x2   Cataract    Complication of anesthesia    Slow to wake up with general anesthesia   Controlled type 2 diabetes mellitus without complication, without long-term current use of insulin (HCC) 02/23/2022   no meds   GERD (gastroesophageal reflux disease)    Hypercholesterolemia 08/25/2022   Hypertension    Hyperthyroidism    Neuromuscular disorder (HCC)    neurpathy feet   Pneumonia    Past Surgical History:  Procedure Laterality Date   ABDOMINAL HYSTERECTOMY  2007   CHOLECYSTECTOMY  1988   TOTAL ABDOMINAL HYSTERECTOMY     TOTAL KNEE ARTHROPLASTY Right 09/13/2023   Procedure: ARTHROPLASTY, KNEE, TOTAL;  Surgeon: Ernie Cough, MD;  Location: [facility];  Service: Orthopedics;  Laterality: Right;   Patient Active Problem List   Diagnosis Date Noted   S/P total knee arthroplasty, right 09/13/2023   Postmenopausal 08/15/2023   Hypercholesterolemia 08/25/2022   Personal history of malignant melanoma of skin 08/25/2022   Vitamin D  deficiency 08/25/2022   Controlled type 2 diabetes mellitus without complication, without long-term current use of insulin (HCC) 02/23/2022   Hypertension 02/02/2021   Melanoma of skin (HCC) 02/02/2021   Raynaud's phenomenon 02/02/2021    Gastroesophageal reflux disease without esophagitis 01/14/2021   Osteoarthritis of right knee 01/04/2021   Hyperthyroidism    Atrial fibrillation (HCC) 07/29/2017    PCP: Willo Mini, NP   REFERRING PROVIDER: Willo Mini, NP   REFERRING DIAG: R20.2 (ICD-10-CM) - Paresthesia of left arm M25.552 (ICD-10-CM) - Left hip pain  THERAPY DIAG:  Muscle weakness (generalized)  Difficulty in walking, not elsewhere classified  Pain in left leg  Cervicalgia  Stiffness of right knee, not elsewhere classified  Other abnormalities of gait and mobility  Acute pain of right knee  Localized edema  RATIONALE FOR EVALUATION AND TREATMENT: Rehabilitation  ONSET DATE: 2 weeks ago LLE radicular pain  NEXT MD VISIT:    SUBJECTIVE:  SUBJECTIVE STATEMENT: Pain not too bad today  68 y/o female referred to PT from PCP for new onset low back and LLE pain as well as LUE paresthesia.  LBP/LLE pain started 2 weeks ago for no apparent reason.  She reports numbness in the L arm and pain/aching from posterior L hip down the entire front and back of the LLE.  Prednisone  helped some.   She was requiring assistance to ambulate prior to taking the prednisone .  She is ambulating with no device now.  Taking Celebrex  again but was taking before. Neck pain:  states had some tingling feelings running from lower cspine up toward posterior skull last few months for no apparent reason.  Started having tingling/numbness in the LUE x 1 week that is all the way into her hand.   PAIN: Are you having pain? Yes: NPRS scale: 1/10 neck right now 8/10 worst Pain location: R hip and neck  Pain description: aching/radiating Aggravating factors: moving and walking a lot;  sitting with LLE in dependent positions Relieving factors:  sitting in recliner with LLE elevated  PERTINENT HISTORY:  Afib, HTN, anemia, OA, diabetes, neuropathy, recent R TKA 3/25,   PRECAUTIONS: None  HAND DOMINANCE: Right  RED FLAGS: None  WEIGHT BEARING RESTRICTIONS: No  FALLS:  Has patient fallen in last 6 months? No  LIVING ENVIRONMENT: Lives with: lives with their family and lives with their spouse Lives in: House/apartment Stairs: Yes: External: 1 steps; none Has following equipment at home: Single point cane and Environmental consultant - 2 wheeled  OCCUPATION: retired Charity fundraiser, Stage manager for [employer]  PLOF: Independent with gait  PATIENT GOALS: not hurt in my neck and LLE    OBJECTIVE:   DIAGNOSTIC FINDINGS:  lumbar and cervical Xray results are pending in the system (done 02/11/24)  PATIENT SURVEYS:  Quick Dash = 40.9 / 100 = 40.9 %     LEFS =  31 / 80 = 38.8 % SCREENING FOR RED FLAGS: Bowel or bladder incontinence: No Spinal tumors: No Cauda equina syndrome: No Compression fracture: No Abdominal aneurysm: No  COGNITION: Overall cognitive status: Within functional limits for tasks assessed     SENSATION: WFL  POSTURE:  rounded shoulders, forward head, and increased thoracic kyphosis/upper thoracic; no scoliosis noted; very flattened lumbar lordosis and not much lower to mid thoracic kyphosis  PALPATION: TTP over the L cervical spine, UT, and levator;  TTP over the L piriformis and hamstrings  CERVICAL ROM:   Active ROM Eval 03/03/24  Flexion 100% full  Extension 60% full  Right lateral flexion 50% 25% limited  Left lateral flexion 50% 25% limited  Right rotation 75% 25% limited  Left rotation 60%; p! 25% limited     Repeated motions do not impact her pain level or LUE parasthesia  UPPER EXTREMITY ROM:  Active ROM Right eval Left eval  Shoulder flexion    Shoulder extension    Shoulder abduction    Shoulder adduction    Shoulder internal rotation    Shoulder external rotation    Elbow flexion     Elbow extension    Wrist flexion    Wrist extension    Wrist ulnar deviation    Wrist radial deviation    Wrist pronation    Wrist supination      (Blank rows = not tested)  UPPER EXTREMITY MMT:  MMT Right eval Left eval  Shoulder flexion 180 180  Shoulder extension    Shoulder abduction  Shoulder adduction    Shoulder internal rotation    Shoulder external rotation    Middle trapezius    Lower trapezius    Elbow flexion    Elbow extension    Wrist flexion    Wrist extension    Wrist ulnar deviation    Wrist radial deviation    Wrist pronation    Wrist supination    Grip strength    (Blank rows = not tested)  LUMBAR ROM:   Active  Eval 03/03/24  Flexion To midshins To ankles  Extension 65%; some endrange p! 50% limited  Right lateral flexion To knee joint To fibular head  Left lateral flexion Just above knee joint; p! Fibular head  Right rotation 75% 40% limited  Left rotation 60%; p! 40% limited    (Blank rows = not tested)  MUSCLE LENGTH: Hamstrings: Right SLR 80 deg; Left SLR 70 deg Thomas test: Right 0 deg; Left 0 deg Hamstrings: 90/90 is 20 deg L; 30 deg R ITB: WNL BLE Piriformis: tight on L   LOWER EXTREMITY ROM:     Active  Right eval Left eval  Hip flexion    Hip extension    Hip abduction    Hip adduction    Hip internal rotation 30 20  Hip external rotation 40 35  Knee flexion    Knee extension    Ankle dorsiflexion    Ankle plantarflexion    Ankle inversion    Ankle eversion     (Blank rows = not tested)  LOWER EXTREMITY MMT:    MMT Right eval Left eval RLE 02/29/24 LLE 02/29/24        Hip flexion 5 4 5 5   Hip extension      Hip abduction 4 4+ 4+ 4-  Hip adduction      Hip internal rotation 4 4- 4 4-  Hip external rotation 4+ 4+ 4+ 4+  Knee flexion 5 5 5 5   Knee extension 5 5 5 5   Ankle dorsiflexion   5 5  Ankle plantarflexion 5 4 4 4   Ankle inversion      Ankle eversion        (Blank rows = not tested)  CERVICAL SPECIAL  TESTS:  Upper limb tension test (ULTT): Negative, Spurling's test: Negative, and Distraction test: Positive;  Adson's test is negative LUE; ULTT negative LUE  LUMBAR SPECIAL TESTS:  Straight leg raise test: Negative, Slump test: Positive, SI Compression/distraction test: Negative, and FABER test: Positive;  LLE may be a little shorter in supine  FUNCTIONAL TESTS:  TBD  GAIT: Distance walked: into clinic distance Assistive device utilized: None Level of assistance: Complete Independence Gait pattern: decreased arm swing- Right, decreased arm swing- Left, decreased step length- Right, and decreased step length- Left Comments: slight antalgic LLE, supinates both feet throughout gait cycle   TODAY'S TREATMENT:  03/03/24 Nustep L4x68min Seated L figure 4 stretch x 1 min Seated L table pigeon stretch x 1 min TRX squats 2x10 TRX lateral lunges 2x10 Heel raise from 4' step x 20 Step up with hip ext x 10 BLE Leg press 25lb 2x10; R/L 15lb 2x10  02/29/24 Bike partial ROM due to TKA LLE x 6' FABER piriformis stretch x 1' LLE Seated hamstring stretch x 1' x 3 BLE Seated piriformis stretch x 1' LLE  THERAPEUTIC ACTIVITIES: To improve functional performance.  Demonstration, verbal and tactile cues throughout for technique. SKTC stretch x 10 x 10 sec holds BLE DKTC stretch w/  strap x 10 x 10 sec holds  Hooklying curl up x 2/10 POE x 1-2 min  NEUROMUSCULAR RE-EDUCATION: To improve balance and posture. Side 1/2 plank x 3/5 on L Prone 1/2 plank x 3/5 Quadruped hip ext x 2/5 BLE with facilitation to maintain flat/still back position  MANUAL THERAPY: To promote reduced pain utilizing manual TP therapy and myofascial release. Deep pressure release to L piriformis Tp manually;   theragun to L hip in general with focus on piriformis  02/27/24 NuStep L5 x 6' Cross over piriformis stretch x 1' x 2 LLE SL hip abd x 2/10 BLE  THERAPEUTIC ACTIVITIES: To improve functional performance.   Demonstration, verbal and tactile cues throughout for technique. SKTC strap stretch w/ 5 sec holds x 10 BLE DKTC stretch 5 sec holds x 10 BLE Abd crunch sliding fingers up thighs x 2/10 Manual SLR ham stretch x 1' x 2 BLE  NEUROMUSCULAR RE-EDUCATION: To improve posture and proprioception. Single leg bridge on peanut ball w/ TA x 2/10 LLE Side 1/2 plank from knees x 2/5 L TRX lateral lunges w/ TA x 10 B   MANUAL THERAPY: To promote reduced pain utilizing STM. STM of L piriformis and buttock with theragun and manually with deep pressure to mid piriformis belly  02/21/24 THERAPEUTIC EXERCISE: To improve strength.  Demonstration, verbal and tactile cues throughout for technique. Bike partial RPM x 8' Bridges x 20 S/L hip abduction x 20 B Supne KTOS and figure4 stretch 1 min each L side  THERAPEUTIC ACTIVITIES: To improve functional performance.  Demonstration, verbal and tactile cues throughout for technique. Standing hip ABD 3# x 20 LLE Standing hip ext 3# x 20 LLE Standing hip ABD to ext x 20 LLE  Standing marching 3# x 20 BLE Supine LTR x 20 Supine FABER piriformis stretch   MANUAL THERAPY: To promote reduced pain utilizing STM. STM of L piriformis, glutes, hamstrings with theragun  02/18/24 THERAPEUTIC EXERCISE: To improve strength.  Demonstration, verbal and tactile cues throughout for technique. Bike partial RPM x 6'  THERAPEUTIC ACTIVITIES: To improve functional performance.  Demonstration, verbal and tactile cues throughout for technique. Standing hip ABD 3# x 20 LLE Standing hip ext 3# x 20 LLE Counter back ext x 15 Supine LTR x 15 Supine FABER piriformis stretch TRX lateral lunge x 10 BLE  NEUROMUSCULAR RE-EDUCATION: To improve kinesthesia and posture.with transverse abdominal contractions on all exercises Supine TA contractions x 15 Supine Peanut rollouts/TA x 20 Supine Ball S/S with TA x 20 BLE Supine bridge/TA x 20 BLE  MANUAL THERAPY: To promote reduced pain  utilizing STM. STM of L piriformis and buttock with theragun   02/14/24 SELF CARE: Provided education on PT POC progression and differential dx of cervical/lumbar nerve compression at level of spine vs. Muscular compression peripherally and difficult telling which based on today's evaluation;  initial HEP.  PATIENT EDUCATION:  Education details: review of knee to chest and sideplanks and adding prone 1/2 plank to HEP  Person educated: Patient Education method: Explanation, Demonstration, Verbal cues, Tactile cues, and Handouts Education comprehension: verbalized understanding, verbal cues required, tactile cues required, and needs further education  HOME EXERCISE PROGRAM: Access Code: [email or web address] URL: [email or web address] Date: 02/29/2024 Prepared by: Garnette Montclair  Exercises - Hooklying Single Knee to Chest Stretch  - 1 x daily - 7 x weekly - 1 sets - 10 reps - 3-5 sec hold - Supine Double Knee to Chest  - 1 x daily - 7 x weekly - 1  sets - 10 reps - 3-5 sec hold - Single Leg Bridge  - 1 x daily - 7 x weekly - 3 sets - 10 reps - Sidelying Hip Abduction  - 1 x daily - 7 x weekly - 2 sets - 10 reps - Side Plank on Knees  - 1 x daily - 7 x weekly - 2 sets - 5 reps - 1-2 sec hold - Plank on Knees  - 1 x daily - 7 x weekly - 3 sets - 10 reps - Quadruped Alternating Leg Extensions  - 1 x daily - 7 x weekly - 3 sets - 10 reps - Seated Hamstring Stretch  - 1 x daily - 7 x weekly - 1 sets - 2 reps - Seated Figure 4 Piriformis Stretch  - 1 x daily - 7 x weekly - 3 sets - 10 reps - Hip Abduction with Resistance Loop  - 1 x daily - 7 x weekly - 3 sets - 10 reps - Hip Extension with Resistance Loop  - 1 x daily - 7 x weekly - 3 sets - 10 reps - Seated Cervical Retraction  - 1 x daily - 7 x weekly - 3 sets - 10 reps - Seated Cervical Extension AROM  - 1 x daily - 7 x weekly - 3 sets - 10 reps - Standing Lumbar Extension  - 1 x daily - 7 x weekly - 1 sets - 10  reps ASSESSMENT:  CLINICAL IMPRESSION: Advanced work on LE functional strengthening to improve squatting ability and flexibility. Pt shows improving lumbar and cervical AROM assessed today. Some weakness noted with lunges and balancing step ups. Continued PT is necessary for addressing the L hip pain, L hip/calf weakness, along with functional weakness seen in B knees.  BRITTANNI CARIKER is a 68 y.o. female who was referred to physical therapy for evaluation and treatment for R20.2 (ICD-10-CM) - Paresthesia of left arm M25.552 (ICD-10-CM) - Left hip pain.     Patient reports onset of LLE pain beginning 2 weeks ago. Pain is worse with prolonged activities with arms or legs.   Standing or being active worsens.   Sitting reclined helps.  Patient has deficits in lumbar and cervical ROM, L hip and L shoulder flexibility, LLE strength, abnormal cervical and lumbar posture, and TTP with abnormal muscle tension in the paracervicals and L hip which are interfering with ADLs and are impacting quality of life.  On QuickDash patient scored 40.9/50 demonstrating 40.9% or moderate disability.  On LEFS patient scored 31/80 demonstrating  31 / 80 = 38.8 % or moderate disability.  Chancey will benefit from skilled PT to address above deficits to improve mobility and activity tolerance with decreased pain interference.  OBJECTIVE IMPAIRMENTS: difficulty walking, decreased ROM, decreased strength, and pain.   ACTIVITY LIMITATIONS: lifting, bending, and locomotion level  PARTICIPATION LIMITATIONS: meal prep, cleaning, laundry, shopping, and community activity  PERSONAL FACTORS: Age and 1-2 comorbidities: Afib, HTN, anemia, OA, diabetes, neuropathy, recent R TKA 3/25,  are also affecting patient's functional outcome.   REHAB POTENTIAL: Good  CLINICAL DECISION MAKING: Evolving/moderate complexity  EVALUATION COMPLEXITY: Moderate   GOALS: Goals reviewed with patient? Yes  SHORT TERM GOALS: Target date:  03/14/2024   Patient will be independent with initial HEP to improve outcomes and carryover.  Baseline: 100% PT assist required for correct completion 02/18/24 added to HEP;  9/2:  can return demo initial HEP Goal status: MET  2.  Patient will report 25% improvement  in neck and  back pain to improve QOL. Baseline: 8/10 worst;  9/2:  0/10 today;  7/10 worst Goal status: MET  LONG TERM GOALS: Target date: 1017/25  Patient will be independent with ongoing/advanced HEP for self-management at home.  Baseline: no advanced HEP yet 02/18/24 advancing HEP Goal status: IN PROGRESS  2.  Patient will report 50-75% improvement in neck and  back pain to improve QOL.  Baseline: 8/10 worst;  9/2:  7/10 worst over the weekend Goal status: IN PROGRESS  3.  Patient will demonstrate improved posture to decrease muscle imbalance. Baseline: flattened lumbar lordosis and increased upper thoracic kyphosis 9/5:  able to do POE today Goal status: IN PROGRESS   5.  Patient will demonstrate full pain free cervical ROM for safety with driving.  Baseline: see cervical ROM tables above Goal status: INITIAL  6.  Patient will demonstrate full pain free lumbar ROM to perform ADLs.   Baseline: see lumbar ROM table above Goal status: INITIAL  8.  Patient will report </= 20% on Quick Dash to demonstrate improved functional ability.  Baseline: 40.9 / 100 = 40.9 % Goal status: INITIAL   9.  Patient will report </= 60% on LEFS  to demonstrate improved functional ability.  Baseline: 38.8% Goal status: INITIAL  10.  Patient to report ability to perform ADLs, household, and work-related tasks without limitation due to cervical/lumbar/LLE pain, LOM or weakness Baseline: in pain most of the time with chores Goal status: INITIAL   11.  Patient will tolerate 1hr of (standing/sitting/walking) to perform shopping. Baseline: can stand 1 hr, but in pain Goal status: INITIAL  12.  Patient will report centralization of  radicular symptoms.  Baseline: pain runs down the LLE to the knee level Goal status: INITIAL PLAN:  PT FREQUENCY: 1-2x/week  PT DURATION: 8 weeks  PLANNED INTERVENTIONS: 02835- PT Re-evaluation, 97750- Physical Performance Testing, 97110-Therapeutic exercises, 97530- Therapeutic activity, 97112- Neuromuscular re-education, 97535- Self Care, 02859- Manual therapy, [id number]- Electrical stimulation (unattended), 97016- Vasopneumatic device, [id number]- Ultrasound, [id number]- Traction (mechanical), [id number]- Ionotophoresis 4mg /ml Dexamethasone , 79439 (1-2 muscles), 20561 (3+ muscles)- Dry Needling, Patient/Family education, Balance training, Stair training, Taping, Joint mobilization, Spinal mobilization, Cryotherapy, and Moist heat  PLAN FOR NEXT SESSION:   Continue with TRX lunges and add step ups;  MFR to L hip for pain relief;  reassess lumbar and cervical ROM and LEFS  Sherif Millspaugh L Gilverto Dileonardo, PTA 03/03/2024, 2:41 PM

## 2024-03-06 ENCOUNTER — Ambulatory Visit

## 2024-03-06 DIAGNOSIS — R600 Localized edema: Secondary | ICD-10-CM | POA: Diagnosis not present

## 2024-03-06 DIAGNOSIS — M6281 Muscle weakness (generalized): Secondary | ICD-10-CM | POA: Diagnosis not present

## 2024-03-06 DIAGNOSIS — M542 Cervicalgia: Secondary | ICD-10-CM

## 2024-03-06 DIAGNOSIS — M79605 Pain in left leg: Secondary | ICD-10-CM

## 2024-03-06 DIAGNOSIS — M25661 Stiffness of right knee, not elsewhere classified: Secondary | ICD-10-CM | POA: Diagnosis not present

## 2024-03-06 DIAGNOSIS — M25561 Pain in right knee: Secondary | ICD-10-CM | POA: Diagnosis not present

## 2024-03-06 DIAGNOSIS — R262 Difficulty in walking, not elsewhere classified: Secondary | ICD-10-CM

## 2024-03-06 DIAGNOSIS — R2689 Other abnormalities of gait and mobility: Secondary | ICD-10-CM | POA: Diagnosis not present

## 2024-03-06 NOTE — Therapy (Signed)
 OUTPATIENT PHYSICAL THERAPY CERVICAL AND THORACOLUMBAR TREATMENT   Patient Name: Jean Hopkins MRN: [id number] DOB:05-16-1956, 68 y.o., female Today's Date: 02/14/2024   END OF SESSION:  PT End of Session - 03/06/24 1144     Visit Number 7    Authorization Type [employer]    PT Start Time 1102    PT Stop Time 1142    PT Time Calculation (min) 40 min    Activity Tolerance Patient tolerated treatment well    Behavior During Therapy WFL for tasks assessed/performed              Past Medical History:  Diagnosis Date   AF (atrial fibrillation) (HCC)    hyperthyroid induced   Allergy    Anemia    Arthritis 11/2020   Right knee   Blood transfusion without reported diagnosis    Cancer (HCC)    Melanoma x2   Cataract    Complication of anesthesia    Slow to wake up with general anesthesia   Controlled type 2 diabetes mellitus without complication, without long-term current use of insulin (HCC) 02/23/2022   no meds   GERD (gastroesophageal reflux disease)    Hypercholesterolemia 08/25/2022   Hypertension    Hyperthyroidism    Neuromuscular disorder (HCC)    neurpathy feet   Pneumonia    Past Surgical History:  Procedure Laterality Date   ABDOMINAL HYSTERECTOMY  2007   CHOLECYSTECTOMY  1988   TOTAL ABDOMINAL HYSTERECTOMY     TOTAL KNEE ARTHROPLASTY Right 09/13/2023   Procedure: ARTHROPLASTY, KNEE, TOTAL;  Surgeon: Ernie Cough, MD;  Location: [facility];  Service: Orthopedics;  Laterality: Right;   Patient Active Problem List   Diagnosis Date Noted   S/P total knee arthroplasty, right 09/13/2023   Postmenopausal 08/15/2023   Hypercholesterolemia 08/25/2022   Personal history of malignant melanoma of skin 08/25/2022   Vitamin D  deficiency 08/25/2022   Controlled type 2 diabetes mellitus without complication, without long-term current use of insulin (HCC) 02/23/2022   Hypertension 02/02/2021   Melanoma of skin (HCC) 02/02/2021   Raynaud's phenomenon 02/02/2021    Gastroesophageal reflux disease without esophagitis 01/14/2021   Osteoarthritis of right knee 01/04/2021   Hyperthyroidism    Atrial fibrillation (HCC) 07/29/2017    PCP: Willo Mini, NP   REFERRING PROVIDER: Willo Mini, NP   REFERRING DIAG: R20.2 (ICD-10-CM) - Paresthesia of left arm M25.552 (ICD-10-CM) - Left hip pain  THERAPY DIAG:  Muscle weakness (generalized)  Difficulty in walking, not elsewhere classified  Pain in left leg  Cervicalgia  RATIONALE FOR EVALUATION AND TREATMENT: Rehabilitation  ONSET DATE: 2 weeks ago LLE radicular pain  NEXT MD VISIT:    SUBJECTIVE:  SUBJECTIVE STATEMENT: Pt reports aching in L hip like always, felt like a knot in L scapula yesterday but got a massage and it feels a bit better.   68 y/o female referred to PT from PCP for new onset low back and LLE pain as well as LUE paresthesia.  LBP/LLE pain started 2 weeks ago for no apparent reason.  She reports numbness in the L arm and pain/aching from posterior L hip down the entire front and back of the LLE.  Prednisone  helped some.   She was requiring assistance to ambulate prior to taking the prednisone .  She is ambulating with no device now.  Taking Celebrex  again but was taking before. Neck pain:  states had some tingling feelings running from lower cspine up toward posterior skull last few months for no apparent reason.  Started having tingling/numbness in the LUE x 1 week that is all the way into her hand.   PAIN: Are you having pain? Yes: NPRS scale: 1/10 neck right now 8/10 worst Pain location: R hip and neck  Pain description: aching/radiating Aggravating factors: moving and walking a lot;  sitting with LLE in dependent positions Relieving factors: sitting in recliner with LLE  elevated  PERTINENT HISTORY:  Afib, HTN, anemia, OA, diabetes, neuropathy, recent R TKA 3/25,   PRECAUTIONS: None  HAND DOMINANCE: Right  RED FLAGS: None  WEIGHT BEARING RESTRICTIONS: No  FALLS:  Has patient fallen in last 6 months? No  LIVING ENVIRONMENT: Lives with: lives with their family and lives with their spouse Lives in: House/apartment Stairs: Yes: External: 1 steps; none Has following equipment at home: Single point cane and Environmental consultant - 2 wheeled  OCCUPATION: retired Charity fundraiser, Stage manager for [employer]  PLOF: Independent with gait  PATIENT GOALS: not hurt in my neck and LLE    OBJECTIVE:   DIAGNOSTIC FINDINGS:  lumbar and cervical Xray results are pending in the system (done 02/11/24)  PATIENT SURVEYS:  Quick Dash = 40.9 / 100 = 40.9 %     LEFS =  31 / 80 = 38.8 % SCREENING FOR RED FLAGS: Bowel or bladder incontinence: No Spinal tumors: No Cauda equina syndrome: No Compression fracture: No Abdominal aneurysm: No  COGNITION: Overall cognitive status: Within functional limits for tasks assessed     SENSATION: WFL  POSTURE:  rounded shoulders, forward head, and increased thoracic kyphosis/upper thoracic; no scoliosis noted; very flattened lumbar lordosis and not much lower to mid thoracic kyphosis  PALPATION: TTP over the L cervical spine, UT, and levator;  TTP over the L piriformis and hamstrings  CERVICAL ROM:   Active ROM Eval 03/03/24  Flexion 100% full  Extension 60% full  Right lateral flexion 50% 25% limited  Left lateral flexion 50% 25% limited  Right rotation 75% 25% limited  Left rotation 60%; p! 25% limited     Repeated motions do not impact her pain level or LUE parasthesia  UPPER EXTREMITY ROM:  Active ROM Right eval Left eval  Shoulder flexion    Shoulder extension    Shoulder abduction    Shoulder adduction    Shoulder internal rotation    Shoulder external rotation    Elbow flexion    Elbow extension    Wrist  flexion    Wrist extension    Wrist ulnar deviation    Wrist radial deviation    Wrist pronation    Wrist supination      (Blank rows = not tested)  UPPER EXTREMITY MMT:  MMT Right eval Left eval  Shoulder flexion 180 180  Shoulder extension    Shoulder abduction    Shoulder adduction    Shoulder internal rotation    Shoulder external rotation    Middle trapezius    Lower trapezius    Elbow flexion    Elbow extension    Wrist flexion    Wrist extension    Wrist ulnar deviation    Wrist radial deviation    Wrist pronation    Wrist supination    Grip strength    (Blank rows = not tested)  LUMBAR ROM:   Active  Eval 03/03/24  Flexion To midshins To ankles  Extension 65%; some endrange p! 50% limited  Right lateral flexion To knee joint To fibular head  Left lateral flexion Just above knee joint; p! Fibular head  Right rotation 75% 40% limited  Left rotation 60%; p! 40% limited    (Blank rows = not tested)  MUSCLE LENGTH: Hamstrings: Right SLR 80 deg; Left SLR 70 deg Thomas test: Right 0 deg; Left 0 deg Hamstrings: 90/90 is 20 deg L; 30 deg R ITB: WNL BLE Piriformis: tight on L   LOWER EXTREMITY ROM:     Active  Right eval Left eval  Hip flexion    Hip extension    Hip abduction    Hip adduction    Hip internal rotation 30 20  Hip external rotation 40 35  Knee flexion    Knee extension    Ankle dorsiflexion    Ankle plantarflexion    Ankle inversion    Ankle eversion     (Blank rows = not tested)  LOWER EXTREMITY MMT:    MMT Right eval Left eval RLE 02/29/24 LLE 02/29/24        Hip flexion 5 4 5 5   Hip extension      Hip abduction 4 4+ 4+ 4-  Hip adduction      Hip internal rotation 4 4- 4 4-  Hip external rotation 4+ 4+ 4+ 4+  Knee flexion 5 5 5 5   Knee extension 5 5 5 5   Ankle dorsiflexion   5 5  Ankle plantarflexion 5 4 4 4   Ankle inversion      Ankle eversion        (Blank rows = not tested)  CERVICAL SPECIAL TESTS:  Upper limb tension  test (ULTT): Negative, Spurling's test: Negative, and Distraction test: Positive;  Adson's test is negative LUE; ULTT negative LUE  LUMBAR SPECIAL TESTS:  Straight leg raise test: Negative, Slump test: Positive, SI Compression/distraction test: Negative, and FABER test: Positive;  LLE may be a little shorter in supine  FUNCTIONAL TESTS:  TBD  GAIT: Distance walked: into clinic distance Assistive device utilized: None Level of assistance: Complete Independence Gait pattern: decreased arm swing- Right, decreased arm swing- Left, decreased step length- Right, and decreased step length- Left Comments: slight antalgic LLE, supinates both feet throughout gait cycle   TODAY'S TREATMENT:  03/06/24  Therapeutic Exercise: Bike partial rev x 6 min Rhomboid stretch 5x5 Standing L levator stretch 1 min Therapeutic Activity: to improve functional performance Sliding clock reach R/L x 6  1/2 way around Sliding lunges x 10 BLE Sliding lateral lunges x 10 BLE Wall squat 2x10 Standing horiz ABD 3x10 YTB  Manual Therapy: STM to L infraspinatus, rhomboids, levator insertion   03/03/24 Nustep L4x70min Seated L figure 4 stretch x 1 min Seated L table pigeon stretch x 1 min TRX  squats 2x10 TRX lateral lunges 2x10 Heel raise from 4' step x 20 Step up with hip ext x 10 BLE Leg press 25lb 2x10; R/L 15lb 2x10  02/29/24 Bike partial ROM due to TKA LLE x 6' FABER piriformis stretch x 1' LLE Seated hamstring stretch x 1' x 3 BLE Seated piriformis stretch x 1' LLE  THERAPEUTIC ACTIVITIES: To improve functional performance.  Demonstration, verbal and tactile cues throughout for technique. SKTC stretch x 10 x 10 sec holds BLE DKTC stretch w/ strap x 10 x 10 sec holds  Hooklying curl up x 2/10 POE x 1-2 min  NEUROMUSCULAR RE-EDUCATION: To improve balance and posture. Side 1/2 plank x 3/5 on L Prone 1/2 plank x 3/5 Quadruped hip ext x 2/5 BLE with facilitation to maintain flat/still back  position  MANUAL THERAPY: To promote reduced pain utilizing manual TP therapy and myofascial release. Deep pressure release to L piriformis Tp manually;   theragun to L hip in general with focus on piriformis  02/27/24 NuStep L5 x 6' Cross over piriformis stretch x 1' x 2 LLE SL hip abd x 2/10 BLE  THERAPEUTIC ACTIVITIES: To improve functional performance.  Demonstration, verbal and tactile cues throughout for technique. SKTC strap stretch w/ 5 sec holds x 10 BLE DKTC stretch 5 sec holds x 10 BLE Abd crunch sliding fingers up thighs x 2/10 Manual SLR ham stretch x 1' x 2 BLE  NEUROMUSCULAR RE-EDUCATION: To improve posture and proprioception. Single leg bridge on peanut ball w/ TA x 2/10 LLE Side 1/2 plank from knees x 2/5 L TRX lateral lunges w/ TA x 10 B   MANUAL THERAPY: To promote reduced pain utilizing STM. STM of L piriformis and buttock with theragun and manually with deep pressure to mid piriformis belly  02/21/24 THERAPEUTIC EXERCISE: To improve strength.  Demonstration, verbal and tactile cues throughout for technique. Bike partial RPM x 8' Bridges x 20 S/L hip abduction x 20 B Supne KTOS and figure4 stretch 1 min each L side  THERAPEUTIC ACTIVITIES: To improve functional performance.  Demonstration, verbal and tactile cues throughout for technique. Standing hip ABD 3# x 20 LLE Standing hip ext 3# x 20 LLE Standing hip ABD to ext x 20 LLE  Standing marching 3# x 20 BLE Supine LTR x 20 Supine FABER piriformis stretch   MANUAL THERAPY: To promote reduced pain utilizing STM. STM of L piriformis, glutes, hamstrings with theragun  02/18/24 THERAPEUTIC EXERCISE: To improve strength.  Demonstration, verbal and tactile cues throughout for technique. Bike partial RPM x 6'  THERAPEUTIC ACTIVITIES: To improve functional performance.  Demonstration, verbal and tactile cues throughout for technique. Standing hip ABD 3# x 20 LLE Standing hip ext 3# x 20 LLE Counter back ext x  15 Supine LTR x 15 Supine FABER piriformis stretch TRX lateral lunge x 10 BLE  NEUROMUSCULAR RE-EDUCATION: To improve kinesthesia and posture.with transverse abdominal contractions on all exercises Supine TA contractions x 15 Supine Peanut rollouts/TA x 20 Supine Ball S/S with TA x 20 BLE Supine bridge/TA x 20 BLE  MANUAL THERAPY: To promote reduced pain utilizing STM. STM of L piriformis and buttock with theragun   02/14/24 SELF CARE: Provided education on PT POC progression and differential dx of cervical/lumbar nerve compression at level of spine vs. Muscular compression peripherally and difficult telling which based on today's evaluation;  initial HEP.  PATIENT EDUCATION:  Education details: added levator stretch to HEP  Person educated: Patient Education method: Programmer, multimedia, Demonstration, Verbal cues,  Tactile cues, and Handouts Education comprehension: verbalized understanding, verbal cues required, tactile cues required, and needs further education  HOME EXERCISE PROGRAM: Access Code: [email or web address] URL: [email or web address] Date: 02/29/2024 Prepared by: Garnette Montclair  Exercises - Hooklying Single Knee to Chest Stretch  - 1 x daily - 7 x weekly - 1 sets - 10 reps - 3-5 sec hold - Supine Double Knee to Chest  - 1 x daily - 7 x weekly - 1 sets - 10 reps - 3-5 sec hold - Single Leg Bridge  - 1 x daily - 7 x weekly - 3 sets - 10 reps - Sidelying Hip Abduction  - 1 x daily - 7 x weekly - 2 sets - 10 reps - Side Plank on Knees  - 1 x daily - 7 x weekly - 2 sets - 5 reps - 1-2 sec hold - Plank on Knees  - 1 x daily - 7 x weekly - 3 sets - 10 reps - Quadruped Alternating Leg Extensions  - 1 x daily - 7 x weekly - 3 sets - 10 reps - Seated Hamstring Stretch  - 1 x daily - 7 x weekly - 1 sets - 2 reps - Seated Figure 4 Piriformis Stretch  - 1 x daily - 7 x weekly - 3 sets - 10 reps - Hip Abduction with Resistance Loop  - 1 x daily - 7 x weekly - 3 sets - 10 reps -  Hip Extension with Resistance Loop  - 1 x daily - 7 x weekly - 3 sets - 10 reps - Seated Cervical Retraction  - 1 x daily - 7 x weekly - 3 sets - 10 reps - Seated Cervical Extension AROM  - 1 x daily - 7 x weekly - 3 sets - 10 reps - Standing Lumbar Extension  - 1 x daily - 7 x weekly - 1 sets - 10 reps ASSESSMENT:  CLINICAL IMPRESSION: Advanced work on LE functional strengthening to improve squatting ability and functional stair climbing. Pt had a tender spot with taut banding right at her L levator scap insertion. I added a levator stretch to her HEP, which she felt really targeted the area. Education given on ways to reduce muscle tension at home with ball on wall, heat and stretching. She responded well to treatment, we will continue progressing interventions as tolerated.   CATHYE KREITER is a 68 y.o. female who was referred to physical therapy for evaluation and treatment for R20.2 (ICD-10-CM) - Paresthesia of left arm M25.552 (ICD-10-CM) - Left hip pain.     Patient reports onset of LLE pain beginning 2 weeks ago. Pain is worse with prolonged activities with arms or legs.   Standing or being active worsens.   Sitting reclined helps.  Patient has deficits in lumbar and cervical ROM, L hip and L shoulder flexibility, LLE strength, abnormal cervical and lumbar posture, and TTP with abnormal muscle tension in the paracervicals and L hip which are interfering with ADLs and are impacting quality of life.  On QuickDash patient scored 40.9/50 demonstrating 40.9% or moderate disability.  On LEFS patient scored 31/80 demonstrating  31 / 80 = 38.8 % or moderate disability.  Marchel will benefit from skilled PT to address above deficits to improve mobility and activity tolerance with decreased pain interference.  OBJECTIVE IMPAIRMENTS: difficulty walking, decreased ROM, decreased strength, and pain.   ACTIVITY LIMITATIONS: lifting, bending, and locomotion level  PARTICIPATION LIMITATIONS: meal prep, cleaning,  laundry, shopping, and community  activity  PERSONAL FACTORS: Age and 1-2 comorbidities: Afib, HTN, anemia, OA, diabetes, neuropathy, recent R TKA 3/25,  are also affecting patient's functional outcome.   REHAB POTENTIAL: Good  CLINICAL DECISION MAKING: Evolving/moderate complexity  EVALUATION COMPLEXITY: Moderate   GOALS: Goals reviewed with patient? Yes  SHORT TERM GOALS: Target date: 03/14/2024   Patient will be independent with initial HEP to improve outcomes and carryover.  Baseline: 100% PT assist required for correct completion 02/18/24 added to HEP;  9/2:  can return demo initial HEP Goal status: MET  2.  Patient will report 25% improvement in neck and  back pain to improve QOL. Baseline: 8/10 worst;  9/2:  0/10 today;  7/10 worst Goal status: MET  LONG TERM GOALS: Target date: 1017/25  Patient will be independent with ongoing/advanced HEP for self-management at home.  Baseline: no advanced HEP yet 02/18/24 advancing HEP Goal status: IN PROGRESS  2.  Patient will report 50-75% improvement in neck and  back pain to improve QOL.  Baseline: 8/10 worst;  9/2:  7/10 worst over the weekend Goal status: IN PROGRESS  3.  Patient will demonstrate improved posture to decrease muscle imbalance. Baseline: flattened lumbar lordosis and increased upper thoracic kyphosis 9/5:  able to do POE today Goal status: IN PROGRESS   5.  Patient will demonstrate full pain free cervical ROM for safety with driving.  Baseline: see cervical ROM tables above Goal status: INITIAL  6.  Patient will demonstrate full pain free lumbar ROM to perform ADLs.   Baseline: see lumbar ROM table above Goal status: INITIAL  8.  Patient will report </= 20% on Quick Dash to demonstrate improved functional ability.  Baseline: 40.9 / 100 = 40.9 % Goal status: INITIAL   9.  Patient will report </= 60% on LEFS  to demonstrate improved functional ability.  Baseline: 38.8% Goal status: INITIAL  10.   Patient to report ability to perform ADLs, household, and work-related tasks without limitation due to cervical/lumbar/LLE pain, LOM or weakness Baseline: in pain most of the time with chores Goal status: INITIAL   11.  Patient will tolerate 1hr of (standing/sitting/walking) to perform shopping. Baseline: can stand 1 hr, but in pain Goal status: INITIAL  12.  Patient will report centralization of radicular symptoms.  Baseline: pain runs down the LLE to the knee level Goal status: INITIAL PLAN:  PT FREQUENCY: 1-2x/week  PT DURATION: 8 weeks  PLANNED INTERVENTIONS: 97164- PT Re-evaluation, 97750- Physical Performance Testing, 97110-Therapeutic exercises, 97530- Therapeutic activity, 97112- Neuromuscular re-education, 97535- Self Care, [id number]- Manual therapy, [id number]- Electrical stimulation (unattended), 97016- Vasopneumatic device, [id number]- Ultrasound, [id number]- Traction (mechanical), [id number]- Ionotophoresis 4mg /ml Dexamethasone , 79439 (1-2 muscles), 20561 (3+ muscles)- Dry Needling, Patient/Family education, Balance training, Stair training, Taping, Joint mobilization, Spinal mobilization, Cryotherapy, and Moist heat  PLAN FOR NEXT SESSION:   Functional hip strengthening, sliding lunges, check to see if knot along L scapula is better; LEFS  Miray Mancino L Malay Fantroy, PTA 03/06/2024, 11:52 AM

## 2024-03-10 ENCOUNTER — Ambulatory Visit (INDEPENDENT_AMBULATORY_CARE_PROVIDER_SITE_OTHER): Admitting: Medical-Surgical

## 2024-03-10 ENCOUNTER — Encounter: Payer: Self-pay | Admitting: Medical-Surgical

## 2024-03-10 ENCOUNTER — Ambulatory Visit

## 2024-03-10 VITALS — BP 139/79 | HR 81 | Resp 20 | Ht 64.0 in | Wt 230.0 lb

## 2024-03-10 DIAGNOSIS — E119 Type 2 diabetes mellitus without complications: Secondary | ICD-10-CM

## 2024-03-10 DIAGNOSIS — E559 Vitamin D deficiency, unspecified: Secondary | ICD-10-CM

## 2024-03-10 DIAGNOSIS — E7800 Pure hypercholesterolemia, unspecified: Secondary | ICD-10-CM | POA: Diagnosis not present

## 2024-03-10 DIAGNOSIS — Z0000 Encounter for general adult medical examination without abnormal findings: Secondary | ICD-10-CM | POA: Diagnosis not present

## 2024-03-10 DIAGNOSIS — Z23 Encounter for immunization: Secondary | ICD-10-CM | POA: Diagnosis not present

## 2024-03-10 LAB — POCT GLYCOSYLATED HEMOGLOBIN (HGB A1C)
HbA1c, POC (controlled diabetic range): 6.8 % (ref 0.0–7.0)
Hemoglobin A1C: 6.8 % — AB (ref 4.0–5.6)

## 2024-03-10 NOTE — Progress Notes (Signed)
 Complete physical exam  Patient: Jean Hopkins   DOB: [birth year]   68 y.o. Female  MRN: [id number]  Subjective:    Chief Complaint  Patient presents with   Annual Exam    Welcome to Medicare    Jean Hopkins is a 68 y.o. female who presents today for a complete physical exam. She reports consuming a general diet. Walking for exercise. She generally feels well. She reports sleeping well. She does not have additional problems to discuss today.    Most recent fall risk assessment:    03/10/2024   10:27 AM  Fall Risk   Falls in the past year? 0  Number falls in past yr: 0  Injury with Fall? 0  Risk for fall due to : No Fall Risks  Follow up Falls evaluation completed     Most recent depression screenings:    03/10/2024   10:28 AM 03/01/2023    8:40 AM  PHQ 2/9 Scores  PHQ - 2 Score 0 0  PHQ- 9 Score 1     Vision:Within last year and Dental: No current dental problems and Receives regular dental care    Patient Care Team: Willo Mini, NP as PCP - General (Nurse Practitioner) Francyne Headland, MD as PCP - Cardiology (Cardiology)   Outpatient Medications Prior to Visit  Medication Sig   acetaminophen  (TYLENOL ) 500 MG tablet Take 1,000 mg by mouth every 6 (six) hours as needed for moderate pain (pain score 4-6).   celecoxib  (CELEBREX ) 100 MG capsule Take 100 mg by mouth 2 (two) times daily.   Cholecalciferol (VITAMIN D  PO) Take 2,000 Units by mouth daily.   hydrochlorothiazide  (HYDRODIURIL ) 12.5 MG tablet Take 1 tablet (12.5 mg total) by mouth daily.   methimazole  (TAPAZOLE ) 5 MG tablet Take 1 tablet (5 mg total) by mouth as directed. Every other day   methocarbamol  (ROBAXIN ) 500 MG tablet Take 500 mg by mouth every 6 (six) hours as needed for muscle spasms.   omeprazole (PRILOSEC) 20 MG capsule Take 20 mg by mouth daily.   No facility-administered medications prior to visit.    Review of Systems  Constitutional:  Negative for chills, fever, malaise/fatigue and weight  loss.  HENT:  Negative for congestion, ear pain, hearing loss, sinus pain and sore throat.   Eyes:  Negative for blurred vision, photophobia and pain.  Respiratory:  Negative for cough, shortness of breath and wheezing.   Cardiovascular:  Negative for chest pain, palpitations and leg swelling.  Gastrointestinal:  Negative for abdominal pain, constipation, diarrhea, heartburn, nausea and vomiting.  Genitourinary:  Negative for dysuria, frequency and urgency.  Musculoskeletal:  Negative for falls and neck pain.  Skin:  Negative for itching and rash.  Neurological:  Negative for dizziness, weakness and headaches.  Endo/Heme/Allergies:  Negative for polydipsia. Does not bruise/bleed easily.  Psychiatric/Behavioral:  Negative for depression, substance abuse and suicidal ideas. The patient is not nervous/anxious and does not have insomnia.      Objective:     BP 139/79 (BP Location: Left Arm, Cuff Size: Normal)   Pulse 81   Resp 20   Ht 5' 4 (1.626 m)   Wt 230 lb (104.3 kg)   SpO2 96%   BMI 39.48 kg/m    Physical Exam Vitals reviewed.  Constitutional:      General: She is not in acute distress.    Appearance: Normal appearance. She is obese. She is not ill-appearing.  HENT:     Head: Normocephalic and  atraumatic.     Right Ear: Tympanic membrane, ear canal and external ear normal. There is no impacted cerumen.     Left Ear: Tympanic membrane, ear canal and external ear normal. There is no impacted cerumen.     Nose: Nose normal. No congestion or rhinorrhea.     Mouth/Throat:     Mouth: Mucous membranes are moist.     Pharynx: No oropharyngeal exudate or posterior oropharyngeal erythema.  Eyes:     General: No scleral icterus.       Right eye: No discharge.        Left eye: No discharge.     Extraocular Movements: Extraocular movements intact.     Conjunctiva/sclera: Conjunctivae normal.     Pupils: Pupils are equal, round, and reactive to light.  Neck:     Thyroid : No  thyromegaly.     Vascular: No carotid bruit or JVD.     Trachea: Trachea normal.  Cardiovascular:     Rate and Rhythm: Normal rate and regular rhythm.     Pulses: Normal pulses.     Heart sounds: Normal heart sounds. No murmur heard.    No friction rub. No gallop.  Pulmonary:     Effort: Pulmonary effort is normal. No respiratory distress.     Breath sounds: Normal breath sounds. No wheezing.  Abdominal:     General: Bowel sounds are normal. There is no distension.     Palpations: Abdomen is soft.     Tenderness: There is no abdominal tenderness. There is no guarding.  Musculoskeletal:        General: Normal range of motion.     Cervical back: Normal range of motion and neck supple.  Lymphadenopathy:     Cervical: No cervical adenopathy.  Skin:    General: Skin is warm and dry.  Neurological:     Mental Status: She is alert and oriented to person, place, and time.     Cranial Nerves: No cranial nerve deficit.  Psychiatric:        Mood and Affect: Mood normal.        Behavior: Behavior normal.        Thought Content: Thought content normal.        Judgment: Judgment normal.      Results for orders placed or performed in visit on 03/10/24  POCT HgB A1C  Result Value Ref Range   Hemoglobin A1C 6.8 (A) 4.0 - 5.6 %   HbA1c POC (<> result, manual entry)     HbA1c, POC (prediabetic range)     HbA1c, POC (controlled diabetic range) 6.8 0.0 - 7.0 %       Assessment & Plan:    Routine Health Maintenance and Physical Exam  Immunization History  Administered Date(s) Administered   Fluzone Influenza virus vaccine,trivalent (IIV3), split virus 04/10/2013, 03/14/2016, 02/27/2017, 02/26/2018   INFLUENZA, HIGH DOSE SEASONAL PF 03/10/2024   Influenza,inj,Quad PF,6+ Mos 03/27/2017   Influenza,inj,quad, With Preservative 03/27/2019   Influenza-Unspecified 03/06/2017, 03/29/2018, 04/01/2019, 03/29/2021, 03/31/2022, 03/23/2023   PFIZER(Purple Top)SARS-COV-2 Vaccination 07/09/2019,  07/30/2019, 04/07/2020   Tdap 09/10/2008, 07/01/2018, 01/14/2021   Unspecified SARS-COV-2 Vaccination 03/23/2023    Health Maintenance  Topic Date Due   Pneumococcal Vaccine: 50+ Years (1 of 2 - PCV) Never done   Zoster Vaccines- Shingrix (1 of 2) Never done   DEXA SCAN  Never done   COVID-19 Vaccine (5 - Pfizer risk 2024-25 season) 03/26/2024 (Originally 02/25/2024)   OPHTHALMOLOGY EXAM  08/12/2024  Diabetic kidney evaluation - Urine ACR  08/14/2024   Diabetic kidney evaluation - eGFR measurement  09/04/2024   HEMOGLOBIN A1C  09/07/2024   FOOT EXAM  03/10/2025   Medicare Annual Wellness (AWV)  03/10/2025   Colonoscopy  05/25/2026   DTaP/Tdap/Td (4 - Td or Tdap) 01/15/2031   Influenza Vaccine  Completed   Hepatitis B Vaccines 19-59 Average Risk  Aged Out   HPV VACCINES  Aged Out   Meningococcal B Vaccine  Aged Out   Mammogram  Discontinued   Hepatitis C Screening  Discontinued   HIV Screening  Discontinued    Discussed health benefits of physical activity, and encouraged her to engage in regular exercise appropriate for her age and condition.  1. Encounter for Medicare annual wellness exam (Primary) See separate note. - EKG 12-Lead  2. Annual physical exam Checking labs as below. UTD on preventative care. Wellness information provided with AVS. - CBC with Differential/Platelet - CMP14+EGFR - Lipid panel  3. Hypercholesterolemia Checking lipids. - Lipid panel  4. Controlled type 2 diabetes mellitus without complication, without long-term current use of insulin (HCC) POCT A1c at 6.8%, up a little from prior 6.4%. Consider addition of a GLP-1 for tighter glucose control and weight loss benefits. Continue diabetic diet modifications. Work on increasing exercise and aim for weight loss.  - Lipid panel - POCT HgB A1C  5. Vitamin D  deficiency Checking vitamin D .  - VITAMIN D  25 Hydroxy (Vit-D Deficiency, Fractures)  6. Morbid obesity (HCC) BMI of 39.48 with at least two  weight related comorbidities. Not currently on medications to help with weight management but is considering the addition of a GLP-1 for treatment of diabetes which will benefit weight control. For now, recommend continuing a low calorie diabetic diet and make sure to aim for regular intentional exercise at least three times weekly.   Return in 1 year (on 03/10/2025) for annual medicare wellness visit (initial).     Naod Sweetland, NP

## 2024-03-10 NOTE — Progress Notes (Signed)
 Subjective:    Jean Hopkins is a 68 y.o. female who presents for a Welcome to Medicare exam.          Objective:    Today's Vitals   03/10/24 0840  BP: 139/79  Pulse: 81  Resp: 20  SpO2: 96%  Weight: 230 lb (104.3 kg)  Height: 5' 4 (1.626 m)  Body mass index is 39.48 kg/m.  Medications Outpatient Encounter Medications as of 03/10/2024  Medication Sig   acetaminophen  (TYLENOL ) 500 MG tablet Take 1,000 mg by mouth every 6 (six) hours as needed for moderate pain (pain score 4-6).   celecoxib  (CELEBREX ) 100 MG capsule Take 100 mg by mouth 2 (two) times daily.   Cholecalciferol (VITAMIN D  PO) Take 2,000 Units by mouth daily.   hydrochlorothiazide  (HYDRODIURIL ) 12.5 MG tablet Take 1 tablet (12.5 mg total) by mouth daily.   methimazole  (TAPAZOLE ) 5 MG tablet Take 1 tablet (5 mg total) by mouth as directed. Every other day   methocarbamol  (ROBAXIN ) 500 MG tablet Take 500 mg by mouth every 6 (six) hours as needed for muscle spasms.   omeprazole (PRILOSEC) 20 MG capsule Take 20 mg by mouth daily.   No facility-administered encounter medications on file as of 03/10/2024.     History: Past Medical History:  Diagnosis Date   AF (atrial fibrillation) (HCC)    hyperthyroid induced   Allergy    Anemia    Arthritis 11/2020   Right knee   Blood transfusion without reported diagnosis    Cancer (HCC)    Melanoma x2   Cataract    Complication of anesthesia    Slow to wake up with general anesthesia   Controlled type 2 diabetes mellitus without complication, without long-term current use of insulin (HCC) 02/23/2022   no meds   GERD (gastroesophageal reflux disease)    Hypercholesterolemia 08/25/2022   Hypertension    Hyperthyroidism    Neuromuscular disorder (HCC)    neurpathy feet   Pneumonia    Past Surgical History:  Procedure Laterality Date   ABDOMINAL HYSTERECTOMY  2007   CHOLECYSTECTOMY  1988   TOTAL ABDOMINAL HYSTERECTOMY     TOTAL KNEE ARTHROPLASTY Right 09/13/2023    Procedure: ARTHROPLASTY, KNEE, TOTAL;  Surgeon: Ernie Cough, MD;  Location: [facility];  Service: Orthopedics;  Laterality: Right;    Family History  Problem Relation Age of Onset   Hypertension Mother    Hyperlipidemia Mother    Multiple sclerosis Mother    Osteoporosis Mother    Hyperlipidemia Father    Hypertension Father    Heart disease Father    Hypertension Brother    Hypertension Brother    Hypertension Brother    Diverticulitis Brother    Stomach cancer Paternal Grandmother    Breast cancer Paternal Aunt    Polycystic ovary syndrome Child    Heart disease Brother    Hypertension Brother    Hypertension Brother    Thyroid  disease Neg Hx    Colon cancer Neg Hx    Esophageal cancer Neg Hx    Rectal cancer Neg Hx    Social History   Occupational History   Not on file  Tobacco Use   Smoking status: Former    Current packs/day: 0.00    Average packs/day: 0.5 packs/day for 1 year (0.5 ttl pk-yrs)    Types: Cigarettes    Start date: 10/25/1979    Quit date: 10/24/1980    Years since quitting: 43.4   Smokeless tobacco:  Never  Vaping Use   Vaping status: Never Used  Substance and Sexual Activity   Alcohol use: Yes    Comment: Once or twice a month   Drug use: No   Sexual activity: Yes    Birth control/protection: Surgical    Comment: hysterectomy    Tobacco Counseling Counseling given: Not Answered   Immunizations and Health Maintenance Immunization History  Administered Date(s) Administered   Fluzone Influenza virus vaccine,trivalent (IIV3), split virus 04/10/2013, 03/14/2016, 02/27/2017, 02/26/2018   INFLUENZA, HIGH DOSE SEASONAL PF 03/10/2024   Influenza,inj,Quad PF,6+ Mos 03/27/2017   Influenza,inj,quad, With Preservative 03/27/2019   Influenza-Unspecified 03/06/2017, 03/29/2018, 04/01/2019, 03/29/2021, 03/31/2022, 03/23/2023   PFIZER(Purple Top)SARS-COV-2 Vaccination 07/09/2019, 07/30/2019, 04/07/2020   Tdap 09/10/2008, 07/01/2018, 01/14/2021    Unspecified SARS-COV-2 Vaccination 03/23/2023   Health Maintenance Due  Topic Date Due   Pneumococcal Vaccine: 50+ Years (1 of 2 - PCV) Never done   Zoster Vaccines- Shingrix (1 of 2) Never done   DEXA SCAN  Never done    Activities of Daily Living    03/10/2024    8:49 AM 03/06/2024    8:47 AM  In your present state of health, do you have any difficulty performing the following activities:  Hearing? 0 0  Vision? 0 0  Difficulty concentrating or making decisions? 0 0  Walking or climbing stairs? 0 0  Dressing or bathing? 0 0  Doing errands, shopping? 0 0  Preparing Food and eating ? N N  Using the Toilet? N N  In the past six months, have you accidently leaked urine? N N  Do you have problems with loss of bowel control? N N  Managing your Medications? N N  Managing your Finances? N N  Housekeeping or managing your Housekeeping? N N    Physical Exam   See additional note.  Advanced Directives: Does Patient Have a Medical Advance Directive?: Yes Type of Advance Directive: Living will, Healthcare Power of Attorney Copy of Healthcare Power of Attorney in Chart?: Yes - validated most recent copy scanned in chart (See row information)  EKG:  normal EKG, normal sinus rhythm, unchanged from previous tracings      Assessment:    This is a routine wellness examination for this patient .   Vision/Hearing screen No results found.   Goals      Weight (lb) < 200 lb (90.7 kg)        Depression Screen    03/10/2024   10:28 AM 03/01/2023    8:40 AM 08/25/2022    8:59 AM 02/23/2022    9:25 AM  PHQ 2/9 Scores  PHQ - 2 Score 0 0 0 0  PHQ- 9 Score 1        Fall Risk    03/10/2024   10:27 AM  Fall Risk   Falls in the past year? 0  Number falls in past yr: 0  Injury with Fall? 0  Risk for fall due to : No Fall Risks  Follow up Falls evaluation completed    Cognitive Function:        03/10/2024    8:57 AM  6CIT Screen  What Year? 0 points  What month? 0 points   What time? 0 points  Count back from 20 0 points  Months in reverse 0 points  Repeat phrase 0 points  Total Score 0 points    Patient Care Team: Willo Mini, NP as PCP - General (Nurse Practitioner) Francyne Headland, MD as PCP - Cardiology (  Cardiology)     Plan:    I have personally reviewed and noted the following in the patient's chart:   Medical and social history Use of alcohol, tobacco or illicit drugs  Current medications and supplements including opioid prescriptions. Patient is not currently taking opioid prescriptions. Functional ability and status Nutritional status Physical activity Advanced directives List of other physicians Hospitalizations, surgeries, and ER visits in previous 12 months Vitals Screenings to include cognitive, depression, and falls Referrals and appointments  In addition, I have reviewed and discussed with patient certain preventive protocols, quality metrics, and best practice recommendations. A written personalized care plan for preventive services as well as general preventive health recommendations were provided to patient.     Zada Palin, NP 03/10/2024

## 2024-03-10 NOTE — Patient Instructions (Signed)
 Bone Density Test: What to Expect A bone density test uses a type of X-ray to measure the amount of calcium and other minerals in your bones. It can measure bone density in the hip and the spine. This test may also be called: Bone densitometry. Bone mineral density test. Dual-energy X-ray absorptiometry (DEXA). You may have this test to: Diagnose or screen for a condition that causes weak or thin bones, called osteoporosis. See what your risk is for a broken bone, also called a fracture. Check how well your treatment for weak or thin bones is working. The test is similar to having a regular X-ray. Tell a health care provider about: Any allergies you have. All medicines you're taking, including vitamins, herbs, eye drops, creams, and over-the-counter medicines. Any problems you or family members have had with anesthesia. Any bleeding problems you have. Any surgeries you've had. Any medical conditions you have. Whether you're pregnant or may be pregnant. Any medical tests you've had within the past 14 days that used contrast. What are the risks? Your health care provider will talk with you about risks. These may include: Being exposed to a small amount of radiation. This can slightly increase your cancer risk. What happens before the test? Do not take any calcium supplements within the 24 hours before your test. You'll need to take off: All metal jewelry. Eyeglasses. Removable dental appliances. Any other metal objects on your body. What happens during the test?  You'll lie down on an exam table. There will be an X-ray machine below you and an imaging device above you. Other devices, such as boxes or braces, may be used to position your body for the scan. The machine will slowly scan your body. You'll need to keep very still while the machine does the scan. The images will show up on a screen in the room. Images will be checked by a specialist after your test is finished. These  steps may vary. Ask what you can expect. What can I expect after the test? Ask when your results will be ready and how to get them. You may need to call or meet with your provider to get your results. This information is not intended to replace advice given to you by your health care provider. Make sure you discuss any questions you have with your health care provider. Document Revised: 10/22/2022 Document Reviewed: 10/22/2022 Elsevier Patient Education  2024 [employer] Health Maintenance, Female Adopting a healthy lifestyle and getting preventive care are important in promoting health and wellness. Ask your health care provider about: The right schedule for you to have regular tests and exams. Things you can do on your own to prevent diseases and keep yourself healthy. What should I know about diet, weight, and exercise? Eat a healthy diet  Eat a diet that includes plenty of vegetables, fruits, low-fat dairy products, and lean protein. Do not eat a lot of foods that are high in solid fats, added sugars, or sodium. Maintain a healthy weight Body mass index (BMI) is used to identify weight problems. It estimates body fat based on height and weight. Your health care provider can help determine your BMI and help you achieve or maintain a healthy weight. Get regular exercise Get regular exercise. This is one of the most important things you can do for your health. Most adults should: Exercise for at least 150 minutes each week. The exercise should increase your heart rate and make you sweat (moderate-intensity exercise). Do strengthening exercises at least  twice a week. This is in addition to the moderate-intensity exercise. Spend less time sitting. Even light physical activity can be beneficial. Watch cholesterol and blood lipids Have your blood tested for lipids and cholesterol at 66 years of age, then have this test every 5 years. Have your cholesterol levels checked more often if: Your  lipid or cholesterol levels are high. You are older than 66 years of age. You are at high risk for heart disease. What should I know about cancer screening? Depending on your health history and family history, you may need to have cancer screening at various ages. This may include screening for: Breast cancer. Cervical cancer. Colorectal cancer. Skin cancer. Lung cancer. What should I know about heart disease, diabetes, and high blood pressure? Blood pressure and heart disease High blood pressure causes heart disease and increases the risk of stroke. This is more likely to develop in people who have high blood pressure readings or are overweight. Have your blood pressure checked: Every 3-5 years if you are 9-41 years of age. Every year if you are 59 years old or older. Diabetes Have regular diabetes screenings. This checks your fasting blood sugar level. Have the screening done: Once every three years after age 14 if you are at a normal weight and have a low risk for diabetes. More often and at a younger age if you are overweight or have a high risk for diabetes. What should I know about preventing infection? Hepatitis B If you have a higher risk for hepatitis B, you should be screened for this virus. Talk with your health care provider to find out if you are at risk for hepatitis B infection. Hepatitis C Testing is recommended for: Everyone born from [birth year] through 1963. Anyone with known risk factors for hepatitis C. Sexually transmitted infections (STIs) Get screened for STIs, including gonorrhea and chlamydia, if: You are sexually active and are younger than 66 years of age. You are older than 66 years of age and your health care provider tells you that you are at risk for this type of infection. Your sexual activity has changed since you were last screened, and you are at increased risk for chlamydia or gonorrhea. Ask your health care provider if you are at risk. Ask your health  care provider about whether you are at high risk for HIV. Your health care provider may recommend a prescription medicine to help prevent HIV infection. If you choose to take medicine to prevent HIV, you should first get tested for HIV. You should then be tested every 3 months for as long as you are taking the medicine. Pregnancy If you are about to stop having your period (premenopausal) and you may become pregnant, seek counseling before you get pregnant. Take 400 to 800 micrograms (mcg) of folic acid every day if you become pregnant. Ask for birth control (contraception) if you want to prevent pregnancy. Osteoporosis and menopause Osteoporosis is a disease in which the bones lose minerals and strength with aging. This can result in bone fractures. If you are 26 years old or older, or if you are at risk for osteoporosis and fractures, ask your health care provider if you should: Be screened for bone loss. Take a calcium or vitamin D supplement to lower your risk of fractures. Be given hormone replacement therapy (HRT) to treat symptoms of menopause. Follow these instructions at home: Alcohol use Do not drink alcohol if: Your health care provider tells you not to drink. You are pregnant,  may be pregnant, or are planning to become pregnant. If you drink alcohol: Limit how much you have to: 0-1 drink a day. Know how much alcohol is in your drink. In the [place] one drink equals one 12 oz bottle of beer (355 mL), one 5 oz glass of wine (148 mL), or one 1 oz glass of hard liquor (44 mL). Lifestyle Do not use any products that contain nicotine or tobacco. These products include cigarettes, chewing tobacco, and vaping devices, such as e-cigarettes. If you need help quitting, ask your health care provider. Do not use street drugs. Do not share needles. Ask your health care provider for help if you need support or information about quitting drugs. General instructions Schedule regular health,  dental, and eye exams. Stay current with your vaccines. Tell your health care provider if: You often feel depressed. You have ever been abused or do not feel safe at home. Summary Adopting a healthy lifestyle and getting preventive care are important in promoting health and wellness. Follow your health care provider's instructions about healthy diet, exercising, and getting tested or screened for diseases. Follow your health care provider's instructions on monitoring your cholesterol and blood pressure. This information is not intended to replace advice given to you by your health care provider. Make sure you discuss any questions you have with your health care provider. Document Revised: 11/01/2020 Document Reviewed: 11/01/2020 Elsevier Patient Education  2024 [employer].

## 2024-03-11 ENCOUNTER — Ambulatory Visit: Payer: Self-pay | Admitting: Medical-Surgical

## 2024-03-11 LAB — LIPID PANEL
Chol/HDL Ratio: 3.6 ratio (ref 0.0–4.4)
Cholesterol, Total: 170 mg/dL (ref 100–199)
HDL: 47 mg/dL (ref >39)
LDL Chol Calc (NIH): 106 mg/dL — ABNORMAL HIGH (ref 0–99)
Triglycerides: 93 mg/dL (ref 0–149)
VLDL Cholesterol Cal: 17 mg/dL (ref 5–40)

## 2024-03-11 LAB — CBC WITH DIFFERENTIAL/PLATELET
Basophils Absolute: 0.1 x10E3/uL (ref 0.0–0.2)
Basos: 1 %
EOS (ABSOLUTE): 0.1 x10E3/uL (ref 0.0–0.4)
Eos: 1 %
Hematocrit: 46.6 % (ref 34.0–46.6)
Hemoglobin: 14.7 g/dL (ref 11.1–15.9)
Immature Grans (Abs): 0 x10E3/uL (ref 0.0–0.1)
Immature Granulocytes: 0 %
Lymphocytes Absolute: 2.3 x10E3/uL (ref 0.7–3.1)
Lymphs: 28 %
MCH: 25.9 pg — ABNORMAL LOW (ref 26.6–33.0)
MCHC: 31.5 g/dL (ref 31.5–35.7)
MCV: 82 fL (ref 79–97)
Monocytes Absolute: 0.6 x10E3/uL (ref 0.1–0.9)
Monocytes: 8 %
Neutrophils Absolute: 5 x10E3/uL (ref 1.4–7.0)
Neutrophils: 62 %
Platelets: 341 x10E3/uL (ref 150–450)
RBC: 5.67 x10E6/uL — ABNORMAL HIGH (ref 3.77–5.28)
RDW: 14.7 % (ref 11.7–15.4)
WBC: 8 x10E3/uL (ref 3.4–10.8)

## 2024-03-11 LAB — CMP14+EGFR
ALT: 21 IU/L (ref 0–32)
AST: 22 IU/L (ref 0–40)
Albumin: 4 g/dL (ref 3.9–4.9)
Alkaline Phosphatase: 76 IU/L (ref 51–125)
BUN/Creatinine Ratio: 17 (ref 12–28)
BUN: 13 mg/dL (ref 8–27)
Bilirubin Total: 0.4 mg/dL (ref 0.0–1.2)
CO2: 22 mmol/L (ref 20–29)
Calcium: 9.4 mg/dL (ref 8.7–10.3)
Chloride: 103 mmol/L (ref 96–106)
Creatinine, Ser: 0.76 mg/dL (ref 0.57–1.00)
Globulin, Total: 2.3 g/dL (ref 1.5–4.5)
Glucose: 109 mg/dL — ABNORMAL HIGH (ref 70–99)
Potassium: 4.1 mmol/L (ref 3.5–5.2)
Sodium: 141 mmol/L (ref 134–144)
Total Protein: 6.3 g/dL (ref 6.0–8.5)
eGFR: 87 mL/min/1.73 (ref >59)

## 2024-03-11 LAB — VITAMIN D 25 HYDROXY (VIT D DEFICIENCY, FRACTURES): Vit D, 25-Hydroxy: 34 ng/mL (ref 30.0–100.0)

## 2024-03-12 ENCOUNTER — Ambulatory Visit: Admitting: Rehabilitation

## 2024-03-12 DIAGNOSIS — M542 Cervicalgia: Secondary | ICD-10-CM | POA: Diagnosis not present

## 2024-03-12 DIAGNOSIS — M25561 Pain in right knee: Secondary | ICD-10-CM | POA: Diagnosis not present

## 2024-03-12 DIAGNOSIS — R262 Difficulty in walking, not elsewhere classified: Secondary | ICD-10-CM | POA: Diagnosis not present

## 2024-03-12 DIAGNOSIS — M79605 Pain in left leg: Secondary | ICD-10-CM | POA: Diagnosis not present

## 2024-03-12 DIAGNOSIS — L821 Other seborrheic keratosis: Secondary | ICD-10-CM | POA: Diagnosis not present

## 2024-03-12 DIAGNOSIS — M6281 Muscle weakness (generalized): Secondary | ICD-10-CM

## 2024-03-12 DIAGNOSIS — R600 Localized edema: Secondary | ICD-10-CM | POA: Diagnosis not present

## 2024-03-12 DIAGNOSIS — R2689 Other abnormalities of gait and mobility: Secondary | ICD-10-CM | POA: Diagnosis not present

## 2024-03-12 DIAGNOSIS — Z85820 Personal history of malignant melanoma of skin: Secondary | ICD-10-CM | POA: Diagnosis not present

## 2024-03-12 DIAGNOSIS — M25661 Stiffness of right knee, not elsewhere classified: Secondary | ICD-10-CM | POA: Diagnosis not present

## 2024-03-12 DIAGNOSIS — D225 Melanocytic nevi of trunk: Secondary | ICD-10-CM | POA: Diagnosis not present

## 2024-03-12 NOTE — Therapy (Signed)
 OUTPATIENT PHYSICAL THERAPY CERVICAL AND THORACOLUMBAR TREATMENT   Patient Name: Jean Hopkins MRN: [id number] DOB:01-Nov-1955, 68 y.o., female Today's Date: 02/14/2024   END OF SESSION:  PT End of Session - 03/12/24 1401     Visit Number 8    Authorization Type [employer]    PT Start Time 1355    PT Stop Time 1430    PT Time Calculation (min) 35 min    Activity Tolerance Patient tolerated treatment well    Behavior During Therapy WFL for tasks assessed/performed              Past Medical History:  Diagnosis Date   AF (atrial fibrillation) (HCC)    hyperthyroid induced   Allergy    Anemia    Arthritis 11/2020   Right knee   Blood transfusion without reported diagnosis    Cancer (HCC)    Melanoma x2   Cataract    Complication of anesthesia    Slow to wake up with general anesthesia   Controlled type 2 diabetes mellitus without complication, without long-term current use of insulin (HCC) 02/23/2022   no meds   GERD (gastroesophageal reflux disease)    Hypercholesterolemia 08/25/2022   Hypertension    Hyperthyroidism    Neuromuscular disorder (HCC)    neurpathy feet   Pneumonia    Past Surgical History:  Procedure Laterality Date   ABDOMINAL HYSTERECTOMY  2007   CHOLECYSTECTOMY  1988   TOTAL ABDOMINAL HYSTERECTOMY     TOTAL KNEE ARTHROPLASTY Right 09/13/2023   Procedure: ARTHROPLASTY, KNEE, TOTAL;  Surgeon: Ernie Cough, MD;  Location: [facility];  Service: Orthopedics;  Laterality: Right;   Patient Active Problem List   Diagnosis Date Noted   S/P total knee arthroplasty, right 09/13/2023   Postmenopausal 08/15/2023   Hypercholesterolemia 08/25/2022   Personal history of malignant melanoma of skin 08/25/2022   Vitamin D  deficiency 08/25/2022   Controlled type 2 diabetes mellitus without complication, without long-term current use of insulin (HCC) 02/23/2022   Hypertension 02/02/2021   Melanoma of skin (HCC) 02/02/2021   Raynaud's phenomenon 02/02/2021    Gastroesophageal reflux disease without esophagitis 01/14/2021   Osteoarthritis of right knee 01/04/2021   Hyperthyroidism    Atrial fibrillation (HCC) 07/29/2017    PCP: Willo Mini, NP   REFERRING PROVIDER: Willo Mini, NP   REFERRING DIAG: R20.2 (ICD-10-CM) - Paresthesia of left arm M25.552 (ICD-10-CM) - Left hip pain  THERAPY DIAG:  Muscle weakness (generalized)  Difficulty in walking, not elsewhere classified  Pain in left leg  RATIONALE FOR EVALUATION AND TREATMENT: Rehabilitation  ONSET DATE: 2 weeks ago LLE radicular pain  NEXT MD VISIT:    SUBJECTIVE:  SUBJECTIVE STATEMENT: Pt reports aching in L hip like always, felt like a knot in L scapula yesterday but got a massage and it feels a bit better.   68 y/o female referred to PT from PCP for new onset low back and LLE pain as well as LUE paresthesia.  LBP/LLE pain started 2 weeks ago for no apparent reason.  She reports numbness in the L arm and pain/aching from posterior L hip down the entire front and back of the LLE.  Prednisone  helped some.   She was requiring assistance to ambulate prior to taking the prednisone .  She is ambulating with no device now.  Taking Celebrex  again but was taking before. Neck pain:  states had some tingling feelings running from lower cspine up toward posterior skull last few months for no apparent reason.  Started having tingling/numbness in the LUE x 1 week that is all the way into her hand.   PAIN: Are you having pain? Yes: NPRS scale: 1/10 neck right now 8/10 worst Pain location: R hip and neck  Pain description: aching/radiating Aggravating factors: moving and walking a lot;  sitting with LLE in dependent positions Relieving factors: sitting in recliner with LLE elevated  PERTINENT  HISTORY:  Afib, HTN, anemia, OA, diabetes, neuropathy, recent R TKA 3/25,   PRECAUTIONS: None  HAND DOMINANCE: Right  RED FLAGS: None  WEIGHT BEARING RESTRICTIONS: No  FALLS:  Has patient fallen in last 6 months? No  LIVING ENVIRONMENT: Lives with: lives with their family and lives with their spouse Lives in: House/apartment Stairs: Yes: External: 1 steps; none Has following equipment at home: Single point cane and Environmental consultant - 2 wheeled  OCCUPATION: retired Charity fundraiser, Stage manager for [employer]  PLOF: Independent with gait  PATIENT GOALS: not hurt in my neck and LLE    OBJECTIVE:   DIAGNOSTIC FINDINGS:  lumbar and cervical Xray results are pending in the system (done 02/11/24)  PATIENT SURVEYS:  Quick Dash = 40.9 / 100 = 40.9 %     LEFS =  31 / 80 = 38.8 % SCREENING FOR RED FLAGS: Bowel or bladder incontinence: No Spinal tumors: No Cauda equina syndrome: No Compression fracture: No Abdominal aneurysm: No  COGNITION: Overall cognitive status: Within functional limits for tasks assessed     SENSATION: WFL  POSTURE:  rounded shoulders, forward head, and increased thoracic kyphosis/upper thoracic; no scoliosis noted; very flattened lumbar lordosis and not much lower to mid thoracic kyphosis  PALPATION: TTP over the L cervical spine, UT, and levator;  TTP over the L piriformis and hamstrings  CERVICAL ROM:   Active ROM Eval 03/03/24  Flexion 100% full  Extension 60% full  Right lateral flexion 50% 25% limited  Left lateral flexion 50% 25% limited  Right rotation 75% 25% limited  Left rotation 60%; p! 25% limited     Repeated motions do not impact her pain level or LUE parasthesia  UPPER EXTREMITY ROM:  Active ROM Right eval Left eval  Shoulder flexion    Shoulder extension    Shoulder abduction    Shoulder adduction    Shoulder internal rotation    Shoulder external rotation    Elbow flexion    Elbow extension    Wrist flexion    Wrist  extension    Wrist ulnar deviation    Wrist radial deviation    Wrist pronation    Wrist supination      (Blank rows = not tested)  UPPER EXTREMITY MMT:  MMT Right eval Left eval  Shoulder flexion 180 180  Shoulder extension    Shoulder abduction    Shoulder adduction    Shoulder internal rotation    Shoulder external rotation    Middle trapezius    Lower trapezius    Elbow flexion    Elbow extension    Wrist flexion    Wrist extension    Wrist ulnar deviation    Wrist radial deviation    Wrist pronation    Wrist supination    Grip strength    (Blank rows = not tested)  LUMBAR ROM:   Active  Eval 03/03/24  Flexion To midshins To ankles  Extension 65%; some endrange p! 50% limited  Right lateral flexion To knee joint To fibular head  Left lateral flexion Just above knee joint; p! Fibular head  Right rotation 75% 40% limited  Left rotation 60%; p! 40% limited    (Blank rows = not tested)  MUSCLE LENGTH: Hamstrings: Right SLR 80 deg; Left SLR 70 deg Thomas test: Right 0 deg; Left 0 deg Hamstrings: 90/90 is 20 deg L; 30 deg R ITB: WNL BLE Piriformis: tight on L   LOWER EXTREMITY ROM:     Active  Right eval Left eval  Hip flexion    Hip extension    Hip abduction    Hip adduction    Hip internal rotation 30 20  Hip external rotation 40 35  Knee flexion    Knee extension    Ankle dorsiflexion    Ankle plantarflexion    Ankle inversion    Ankle eversion     (Blank rows = not tested)  LOWER EXTREMITY MMT:    MMT Right eval Left eval RLE 02/29/24 LLE 02/29/24        Hip flexion 5 4 5 5   Hip extension      Hip abduction 4 4+ 4+ 4-  Hip adduction      Hip internal rotation 4 4- 4 4-  Hip external rotation 4+ 4+ 4+ 4+  Knee flexion 5 5 5 5   Knee extension 5 5 5 5   Ankle dorsiflexion   5 5  Ankle plantarflexion 5 4 4 4   Ankle inversion      Ankle eversion        (Blank rows = not tested)  CERVICAL SPECIAL TESTS:  Upper limb tension test (ULTT):  Negative, Spurling's test: Negative, and Distraction test: Positive;  Adson's test is negative LUE; ULTT negative LUE  LUMBAR SPECIAL TESTS:  Straight leg raise test: Negative, Slump test: Positive, SI Compression/distraction test: Negative, and FABER test: Positive;  LLE may be a little shorter in supine  FUNCTIONAL TESTS:  TBD  GAIT: Distance walked: into clinic distance Assistive device utilized: None Level of assistance: Complete Independence Gait pattern: decreased arm swing- Right, decreased arm swing- Left, decreased step length- Right, and decreased step length- Left Comments: slight antalgic LLE, supinates both feet throughout gait cycle   TODAY'S TREATMENT:  03/12/24 Nustep L5 x 8'  NEUROMUSCULAR RE-EDUCATION: To improve balance and proprioception. Reverse lunge w/ slider x 2/10 LLE Lateral lunge w/ slider x 2/10 LLE Step up w/ hip extension x 2/10 BLE Monster walk blue TB x 50' x 4 Wall squat w/ small swiss ball at back x 2/10   THERAPEUTIC ACTIVITIES: To improve functional performance.  Demonstration, verbal and tactile cues throughout for technique. Standing hip flexor stretch x 1' LLE Modified pigeon stretch x 1' x 2 LLE Manual  PT assist ham stretch x 1' x 2 LLE  03/06/24  Therapeutic Exercise: Bike partial rev x 6 min Rhomboid stretch 5x5 Standing L levator stretch 1 min Therapeutic Activity: to improve functional performance Sliding clock reach R/L x 6  1/2 way around Sliding lunges x 10 BLE Sliding lateral lunges x 10 BLE Wall squat 2x10 Standing horiz ABD 3x10 YTB  Manual Therapy: STM to L infraspinatus, rhomboids, levator insertion   03/03/24 Nustep L4x64min Seated L figure 4 stretch x 1 min Seated L table pigeon stretch x 1 min TRX squats 2x10 TRX lateral lunges 2x10 Heel raise from 4' step x 20 Step up with hip ext x 10 BLE Leg press 25lb 2x10; R/L 15lb 2x10 PATIENT EDUCATION:  Education details: added levator stretch to HEP  Person  educated: Patient Education method: Programmer, multimedia, Demonstration, Verbal cues, Tactile cues, and Handouts Education comprehension: verbalized understanding, verbal cues required, tactile cues required, and needs further education  HOME EXERCISE PROGRAM: Access Code: [email or web address] URL: [email or web address] Date: 02/29/2024 Prepared by: Garnette Montclair  Exercises - Hooklying Single Knee to Chest Stretch  - 1 x daily - 7 x weekly - 1 sets - 10 reps - 3-5 sec hold - Supine Double Knee to Chest  - 1 x daily - 7 x weekly - 1 sets - 10 reps - 3-5 sec hold - Single Leg Bridge  - 1 x daily - 7 x weekly - 3 sets - 10 reps - Sidelying Hip Abduction  - 1 x daily - 7 x weekly - 2 sets - 10 reps - Side Plank on Knees  - 1 x daily - 7 x weekly - 2 sets - 5 reps - 1-2 sec hold - Plank on Knees  - 1 x daily - 7 x weekly - 3 sets - 10 reps - Quadruped Alternating Leg Extensions  - 1 x daily - 7 x weekly - 3 sets - 10 reps - Seated Hamstring Stretch  - 1 x daily - 7 x weekly - 1 sets - 2 reps - Seated Figure 4 Piriformis Stretch  - 1 x daily - 7 x weekly - 3 sets - 10 reps - Hip Abduction with Resistance Loop  - 1 x daily - 7 x weekly - 3 sets - 10 reps - Hip Extension with Resistance Loop  - 1 x daily - 7 x weekly - 3 sets - 10 reps - Seated Cervical Retraction  - 1 x daily - 7 x weekly - 3 sets - 10 reps - Seated Cervical Extension AROM  - 1 x daily - 7 x weekly - 3 sets - 10 reps - Standing Lumbar Extension  - 1 x daily - 7 x weekly - 1 sets - 10 reps ASSESSMENT:  CLINICAL IMPRESSION: Patient reports feeling better with no pain.  Her main issue is the LLE feeling weak and being unsure of it at times.  She is progressing well with strengthening of the L hip and low back.   Reluctant to advance her strengthening too much today since she leaves tomorrow to go to [place] for a week for grandchild's birthday.  Reviewed importance of frequent stretching on the trip.  PT remains necessary for LLE  weakness, and cervical ROM deficits.  Continue per POC after she returns from her trip  Jean Hopkins is a 68 y.o. female who was referred to physical therapy for evaluation and treatment for R20.2 (ICD-10-CM) - Paresthesia of left arm M25.552 (ICD-10-CM) - Left hip  pain.     Patient reports onset of LLE pain beginning 2 weeks ago. Pain is worse with prolonged activities with arms or legs.   Standing or being active worsens.   Sitting reclined helps.  Patient has deficits in lumbar and cervical ROM, L hip and L shoulder flexibility, LLE strength, abnormal cervical and lumbar posture, and TTP with abnormal muscle tension in the paracervicals and L hip which are interfering with ADLs and are impacting quality of life.  On QuickDash patient scored 40.9/50 demonstrating 40.9% or moderate disability.  On LEFS patient scored 31/80 demonstrating  31 / 80 = 38.8 % or moderate disability.  Jean Hopkins will benefit from skilled PT to address above deficits to improve mobility and activity tolerance with decreased pain interference.  OBJECTIVE IMPAIRMENTS: difficulty walking, decreased ROM, decreased strength, and pain.   ACTIVITY LIMITATIONS: lifting, bending, and locomotion level  PARTICIPATION LIMITATIONS: meal prep, cleaning, laundry, shopping, and community activity  PERSONAL FACTORS: Age and 1-2 comorbidities: Afib, HTN, anemia, OA, diabetes, neuropathy, recent R TKA 3/25,  are also affecting patient's functional outcome.   REHAB POTENTIAL: Good  CLINICAL DECISION MAKING: Evolving/moderate complexity  EVALUATION COMPLEXITY: Moderate   GOALS: Goals reviewed with patient? Yes  SHORT TERM GOALS: Target date: 03/14/2024   Patient will be independent with initial HEP to improve outcomes and carryover.  Baseline: 100% PT assist required for correct completion 02/18/24 added to HEP;  9/2:  can return demo initial HEP Goal status: MET  2.  Patient will report 25% improvement in neck and  back pain to improve  QOL. Baseline: 8/10 worst;  9/2:  0/10 today;  7/10 worst Goal status: MET  LONG TERM GOALS: Target date: 1017/25  Patient will be independent with ongoing/advanced HEP for self-management at home.  Baseline: no advanced HEP yet 02/18/24 advancing HEP Goal status: IN PROGRESS  2.  Patient will report 50-75% improvement in neck and  back pain to improve QOL.  Baseline: 8/10 worst;  9/2:  7/10 worst over the weekend 03/12/24:  States 0/10 pain last few days Goal status: IN PROGRESS  3.  Patient will demonstrate improved posture to decrease muscle imbalance. Baseline: flattened lumbar lordosis and increased upper thoracic kyphosis 9/5:  able to do POE today Goal status: IN PROGRESS   5.  Patient will demonstrate full pain free cervical ROM for safety with driving.  Baseline: see cervical ROM tables above 03/03/24:  25% limited for bilateral sidebending and rotation Goal status: IN PROGRESS  6.  Patient will demonstrate full pain free lumbar ROM to perform ADLs.   Baseline: see lumbar ROM table above Goal status: INITIAL  8.  Patient will report </= 20% on Quick Dash to demonstrate improved functional ability.  Baseline: 40.9 / 100 = 40.9 % Goal status: INITIAL   9.  Patient will report </= 60/80 on LEFS  to demonstrate improved functional ability.  Baseline: 31/80 03/12/24:  LEFS = 55/80 Goal status: IN PROGRESS  10.  Patient to report ability to perform ADLs, household, and work-related tasks without limitation due to cervical/lumbar/LLE pain, LOM or weakness Baseline: in pain most of the time with chores 03/12/24: Doing better with chores Goal status: IN PROGRESS   11.  Patient will tolerate 1hr of (standing/sitting/walking) to perform shopping. Baseline: can stand 1 hr, but in pain Goal status: INITIAL  12.  Patient will report centralization of radicular symptoms.  Baseline: pain runs down the LLE to the knee level 03/12/24:  no radicular symptoms in  2 weeks Goal status:  MET PLAN:  PT FREQUENCY: 1-2x/week  PT DURATION: 8 weeks  PLANNED INTERVENTIONS: [id number]- PT Re-evaluation, 97750- Physical Performance Testing, 97110-Therapeutic exercises, 97530- Therapeutic activity, [id number]- Neuromuscular re-education, 97535- Self Care, [id number]- Manual therapy, [id number]- Electrical stimulation (unattended), 97016- Vasopneumatic device, [id number]- Ultrasound, [id number]- Traction (mechanical), [id number]- Ionotophoresis 4mg /ml Dexamethasone , 79439 (1-2 muscles), 20561 (3+ muscles)- Dry Needling, Patient/Family education, Balance training, Stair training, Taping, Joint mobilization, Spinal mobilization, Cryotherapy, and Moist heat  PLAN FOR NEXT SESSION:   Elliptical, Check strength, Annitta, work on cervical flexibility  Lashaun Krapf, PT 03/12/2024, 2:50 PM

## 2024-03-20 ENCOUNTER — Encounter: Payer: Self-pay | Admitting: Physical Therapy

## 2024-03-20 ENCOUNTER — Ambulatory Visit: Admitting: Physical Therapy

## 2024-03-20 DIAGNOSIS — M6281 Muscle weakness (generalized): Secondary | ICD-10-CM

## 2024-03-20 DIAGNOSIS — R600 Localized edema: Secondary | ICD-10-CM | POA: Diagnosis not present

## 2024-03-20 DIAGNOSIS — M542 Cervicalgia: Secondary | ICD-10-CM

## 2024-03-20 DIAGNOSIS — R262 Difficulty in walking, not elsewhere classified: Secondary | ICD-10-CM | POA: Diagnosis not present

## 2024-03-20 DIAGNOSIS — M79605 Pain in left leg: Secondary | ICD-10-CM

## 2024-03-20 DIAGNOSIS — M25661 Stiffness of right knee, not elsewhere classified: Secondary | ICD-10-CM | POA: Diagnosis not present

## 2024-03-20 DIAGNOSIS — R2689 Other abnormalities of gait and mobility: Secondary | ICD-10-CM | POA: Diagnosis not present

## 2024-03-20 DIAGNOSIS — M25561 Pain in right knee: Secondary | ICD-10-CM | POA: Diagnosis not present

## 2024-03-20 NOTE — Therapy (Signed)
 OUTPATIENT PHYSICAL THERAPY CERVICAL AND THORACOLUMBAR TREATMENT   Patient Name: Jean Hopkins MRN: [id number] DOB:11-Jun-1956, 68 y.o., female Today's Date: 02/14/2024   END OF SESSION:  PT End of Session - 03/20/24 1016     Visit Number 9    Date for Recertification  04/11/24    Authorization Type [employer]    Progress Note Due on Visit 10    PT Start Time 1016    PT Stop Time 1058    PT Time Calculation (min) 42 min    Activity Tolerance Patient tolerated treatment well    Behavior During Therapy WFL for tasks assessed/performed               Past Medical History:  Diagnosis Date   AF (atrial fibrillation) (HCC)    hyperthyroid induced   Allergy    Anemia    Arthritis 11/2020   Right knee   Blood transfusion without reported diagnosis    Cancer (HCC)    Melanoma x2   Cataract    Complication of anesthesia    Slow to wake up with general anesthesia   Controlled type 2 diabetes mellitus without complication, without long-term current use of insulin (HCC) 02/23/2022   no meds   GERD (gastroesophageal reflux disease)    Hypercholesterolemia 08/25/2022   Hypertension    Hyperthyroidism    Neuromuscular disorder (HCC)    neurpathy feet   Pneumonia    Past Surgical History:  Procedure Laterality Date   ABDOMINAL HYSTERECTOMY  2007   CHOLECYSTECTOMY  1988   TOTAL ABDOMINAL HYSTERECTOMY     TOTAL KNEE ARTHROPLASTY Right 09/13/2023   Procedure: ARTHROPLASTY, KNEE, TOTAL;  Surgeon: Ernie Cough, MD;  Location: [facility];  Service: Orthopedics;  Laterality: Right;   Patient Active Problem List   Diagnosis Date Noted   S/P total knee arthroplasty, right 09/13/2023   Postmenopausal 08/15/2023   Hypercholesterolemia 08/25/2022   Personal history of malignant melanoma of skin 08/25/2022   Vitamin D  deficiency 08/25/2022   Controlled type 2 diabetes mellitus without complication, without long-term current use of insulin (HCC) 02/23/2022   Hypertension 02/02/2021    Melanoma of skin (HCC) 02/02/2021   Raynaud's phenomenon 02/02/2021   Gastroesophageal reflux disease without esophagitis 01/14/2021   Osteoarthritis of right knee 01/04/2021   Hyperthyroidism    Atrial fibrillation (HCC) 07/29/2017    PCP: Willo Mini, NP   REFERRING PROVIDER: Willo Mini, NP   REFERRING DIAG: R20.2 (ICD-10-CM) - Paresthesia of left arm M25.552 (ICD-10-CM) - Left hip pain  THERAPY DIAG:  Cervicalgia  Pain in left leg  Muscle weakness (generalized)  Difficulty in walking, not elsewhere classified  RATIONALE FOR EVALUATION AND TREATMENT: Rehabilitation  ONSET DATE: 2 weeks ago LLE radicular pain  NEXT MD VISIT: TBD   SUBJECTIVE:  SUBJECTIVE STATEMENT: Pt reports mild aching in low back today. Pt reports she is supposed to f/u with her NP in the next few weeks if she is still having trouble in order to schedule a MRI.  No neck pain today but stiffness still noted.  68 y/o female referred to PT from PCP for new onset low back and LLE pain as well as LUE paresthesia.  LBP/LLE pain started 2 weeks ago for no apparent reason.  She reports numbness in the L arm and pain/aching from posterior L hip down the entire front and back of the LLE.  Prednisone  helped some.   She was requiring assistance to ambulate prior to taking the prednisone .  She is ambulating with no device now.  Taking Celebrex  again but was taking before. Neck pain:  states had some tingling feelings running from lower cspine up toward posterior skull last few months for no apparent reason.  Started having tingling/numbness in the LUE x 1 week that is all the way into her hand.   PAIN: Are you having pain? Yes: NPRS scale: 1/10  Pain location: low back  Pain description: aching  Aggravating factors:  uncertain  Relieving factors: rest, occasional Tylenol   PERTINENT HISTORY:  Afib, HTN, anemia, OA, diabetes, neuropathy, recent R TKA 3/25,   PRECAUTIONS: None  HAND DOMINANCE: Right  RED FLAGS: None  WEIGHT BEARING RESTRICTIONS: No  FALLS:  Has patient fallen in last 6 months? No  LIVING ENVIRONMENT: Lives with: lives with their family and lives with their spouse Lives in: House/apartment Stairs: Yes: External: 1 steps; none Has following equipment at home: Single point cane and Environmental consultant - 2 wheeled  OCCUPATION: retired Charity fundraiser, Stage manager for [employer]  PLOF: Independent with gait  PATIENT GOALS: not hurt in my neck and LLE    OBJECTIVE:   DIAGNOSTIC FINDINGS:  lumbar and cervical Xray results are pending in the system (done 02/11/24)  PATIENT SURVEYS:  Quick Dash = 40.9 / 100 = 40.9 % LEFS =  31 / 80 = 38.8 %  SCREENING FOR RED FLAGS: Bowel or bladder incontinence: No Spinal tumors: No Cauda equina syndrome: No Compression fracture: No Abdominal aneurysm: No  COGNITION: Overall cognitive status: Within functional limits for tasks assessed     SENSATION: WFL  POSTURE:  rounded shoulders, forward head, and increased thoracic kyphosis/upper thoracic; no scoliosis noted; very flattened lumbar lordosis and not much lower to mid thoracic kyphosis  PALPATION: TTP over the L cervical spine, UT, and levator;  TTP over the L piriformis and hamstrings  CERVICAL ROM:   Active ROM Eval 03/03/24  Flexion 100% full  Extension 60% full  Right lateral flexion 50% 25% limited  Left lateral flexion 50% 25% limited  Right rotation 75% 25% limited  Left rotation 60%; p! 25% limited     Repeated motions do not impact her pain level or LUE parasthesia  UPPER EXTREMITY ROM:  Active ROM Right eval Left eval  Shoulder flexion    Shoulder extension    Shoulder abduction    Shoulder adduction    Shoulder internal rotation    Shoulder external rotation     Elbow flexion    Elbow extension    Wrist flexion    Wrist extension    Wrist ulnar deviation    Wrist radial deviation    Wrist pronation    Wrist supination      (Blank rows = not tested)  UPPER EXTREMITY MMT:  MMT Right eval  Left eval  Shoulder flexion 180 180  Shoulder extension    Shoulder abduction    Shoulder adduction    Shoulder internal rotation    Shoulder external rotation    Middle trapezius    Lower trapezius    Elbow flexion    Elbow extension    Wrist flexion    Wrist extension    Wrist ulnar deviation    Wrist radial deviation    Wrist pronation    Wrist supination    Grip strength    (Blank rows = not tested)  LUMBAR ROM:   Active  Eval 03/03/24  Flexion To midshins To ankles  Extension 65%; some endrange p! 50% limited  Right lateral flexion To knee joint To fibular head  Left lateral flexion Just above knee joint; p! Fibular head  Right rotation 75% 40% limited  Left rotation 60%; p! 40% limited    (Blank rows = not tested)  MUSCLE LENGTH: Hamstrings: Right SLR 80 deg; Left SLR 70 deg Thomas test: Right 0 deg; Left 0 deg Hamstrings: 90/90 is 20 deg L; 30 deg R ITB: WNL BLE Piriformis: tight on L   LOWER EXTREMITY ROM:     Active  Right eval Left eval  Hip flexion    Hip extension    Hip abduction    Hip adduction    Hip internal rotation 30 20  Hip external rotation 40 35  Knee flexion    Knee extension    Ankle dorsiflexion    Ankle plantarflexion    Ankle inversion    Ankle eversion     (Blank rows = not tested)  LOWER EXTREMITY MMT:    MMT Right eval Left eval RLE 02/29/24 LLE 02/29/24 R 03/20/24 L 03/20/24          Hip flexion 5 4 5 5 5 5   Hip extension     5 4+  Hip abduction 4 4+ 4+ 4- 4+ 4  Hip adduction     4+ 4  Hip internal rotation 4 4- 4 4- 4+ 4+  Hip external rotation 4+ 4+ 4+ 4+ 4+ 4+  Knee flexion 5 5 5 5     Knee extension 5 5 5 5     Ankle dorsiflexion   5 5    Ankle plantarflexion 5 4 4 4  4+ 4  Ankle  inversion        Ankle eversion          (Blank rows = not tested)  CERVICAL SPECIAL TESTS:  Upper limb tension test (ULTT): Negative, Spurling's test: Negative, and Distraction test: Positive;  Adson's test is negative LUE; ULTT negative LUE  LUMBAR SPECIAL TESTS:  Straight leg raise test: Negative, Slump test: Positive, SI Compression/distraction test: Negative, and FABER test: Positive;  LLE may be a little shorter in supine  FUNCTIONAL TESTS:  TBD  GAIT: Distance walked: into clinic distance Assistive device utilized: None Level of assistance: Complete Independence Gait pattern: decreased arm swing- Right, decreased arm swing- Left, decreased step length- Right, and decreased step length- Left Comments: slight antalgic LLE, supinates both feet throughout gait cycle   TODAY'S TREATMENT:   03/20/24 THERAPEUTIC EXERCISE: To improve strength, endurance, ROM, and flexibility.  Demonstration, verbal and tactile cues throughout for technique.  Elliptical - L1.0 x 3 min - limited by fatigue Cervical extension SNAG with pillowcase x 10 B cervical rotation SNAGs with pillowcase x 10  THERAPEUTIC ACTIVITIES: To improve functional performance.  Demonstration, verbal and tactile cues  throughout for technique. QuickDASH: 18.2 / 100 = 18.2 % LE MMT Standing heel raises B con/L ecc x 10  NEUROMUSCULAR RE-EDUCATION: To improve coordination, kinesthesia, posture, and proprioception. Standing plank on wall ladder + alt hip extension x 10 Standing plank position on counter + alt bird dog x 10 Seated RTB resisted cervical retraction x 10 POE cervical retraction x 10 - pt noting better muscle activation than with RTB   03/12/24 Nustep L5 x 8'  NEUROMUSCULAR RE-EDUCATION: To improve balance and proprioception. Reverse lunge w/ slider x 2/10 LLE Lateral lunge w/ slider x 2/10 LLE Step up w/ hip extension x 2/10 BLE Monster walk blue TB x 50' x 4 Wall squat w/ small swiss ball at back x  2/10   THERAPEUTIC ACTIVITIES: To improve functional performance.  Demonstration, verbal and tactile cues throughout for technique. Standing hip flexor stretch x 1' LLE Modified pigeon stretch x 1' x 2 LLE Manual PT assist ham stretch x 1' x 2 LLE   03/06/24 Therapeutic Exercise: Bike partial rev x 6 min Rhomboid stretch 5x5 Standing L levator stretch 1 min Therapeutic Activity: to improve functional performance Sliding clock reach R/L x 6  1/2 way around Sliding lunges x 10 BLE Sliding lateral lunges x 10 BLE Wall squat 2x10 Standing horiz ABD 3x10 YTB  Manual Therapy: STM to L infraspinatus, rhomboids, levator insertion   03/03/24 Nustep L4x48min Seated L figure 4 stretch x 1 min Seated L table pigeon stretch x 1 min TRX squats 2x10 TRX lateral lunges 2x10 Heel raise from 4' step x 20 Step up with hip ext x 10 BLE Leg press 25lb 2x10; R/L 15lb 2x10   PATIENT EDUCATION:  Education details: added levator stretch to HEP  Person educated: Patient Education method: Programmer, multimedia, Demonstration, Verbal cues, Tactile cues, and Handouts Education comprehension: verbalized understanding, verbal cues required, tactile cues required, and needs further education  HOME EXERCISE PROGRAM: Access Code: [email or web address] URL: [email or web address] Date: 03/20/2024 Prepared by: Elijah Hidden  Exercises - Hooklying Single Knee to Chest Stretch  - 1 x daily - 7 x weekly - 1 sets - 10 reps - 3-5 sec hold - Supine Double Knee to Chest  - 1 x daily - 7 x weekly - 1 sets - 10 reps - 3-5 sec hold - Single Leg Bridge  - 1 x daily - 7 x weekly - 3 sets - 10 reps - Sidelying Hip Abduction  - 1 x daily - 7 x weekly - 2 sets - 10 reps - Side Plank on Knees  - 1 x daily - 7 x weekly - 2 sets - 5 reps - 1-2 sec hold - Plank on Knees  - 1 x daily - 7 x weekly - 3 sets - 10 reps - Seated Hamstring Stretch  - 1 x daily - 7 x weekly - 1 sets - 2 reps - Seated Figure 4 Piriformis Stretch  - 1 x  daily - 7 x weekly - 3 sets - 10 reps - Hip Abduction with Resistance Loop  - 1 x daily - 7 x weekly - 3 sets - 10 reps - Hip Extension with Resistance Loop  - 1 x daily - 7 x weekly - 3 sets - 10 reps - Seated Cervical Extension AROM  - 1 x daily - 7 x weekly - 3 sets - 10 reps - Standing Lumbar Extension  - 1 x daily - 7 x weekly - 1 sets - 10 reps - Gentle Levator  Scapulae Stretch  - 1 x daily - 7 x weekly - 3 sets - 3 reps - 1 minute hold - Standing Supported Hip Extension at [employer]  - 1 x daily - 7 x weekly - 2 sets - 10 reps - 3 sec hold - Bird Dog on Counter  - 1 x daily - 7 x weekly - 2 sets - 10 reps - 3 sec hold - Cervical Retraction Prone on Elbows  - 1 x daily - 7 x weekly - 2 sets - 10 reps - 3 sec hold - Mid-Lower Cervical Extension SNAG with Strap  - 1 x daily - 7 x weekly - 2 sets - 10 reps - 3 sec hold - Seated Assisted Cervical Rotation with Towel  - 1 x daily - 7 x weekly - 2 sets - 10 reps - 3 sec hold   ASSESSMENT:  CLINICAL IMPRESSION: Aubriegh reports less numbness and tingling in her L UE but still notes stiffness in her neck. QuickDASH improved from 40% to 18.2% - LTG #8 now met.  Introduced cervical extension and rotation SNAGs to facilitate increased cervical flexibility and address ongoing stiffness as well as progressed cervical retraction strengthening with RTB vs gravity resistance - latter preferred.  L LE strength gradually improving with main weakness still evident in lateral hip and ankle PF, therefore reviewed relevant strengthening exercises.  Pt noting difficulty with quadruped hip extension due to discomfort with weight bearing on her TKA, therefore modified this to plank position on counter and also progressed to include bird dog in the same position.  Makilah will benefit from continued skilled PT to address ongoing flexibility, ROM and strength deficits to improve mobility and activity tolerance with decreased pain interference.   EVAL: TIARAH SHISLER is a 67 y.o.  female who was referred to physical therapy for evaluation and treatment for R20.2 (ICD-10-CM) - Paresthesia of left arm M25.552 (ICD-10-CM) - Left hip pain.     Patient reports onset of LLE pain beginning 2 weeks ago. Pain is worse with prolonged activities with arms or legs.   Standing or being active worsens.   Sitting reclined helps.  Patient has deficits in lumbar and cervical ROM, L hip and L shoulder flexibility, LLE strength, abnormal cervical and lumbar posture, and TTP with abnormal muscle tension in the paracervicals and L hip which are interfering with ADLs and are impacting quality of life.  On QuickDash patient scored 40.9/50 demonstrating 40.9% or moderate disability.  On LEFS patient scored 31/80 demonstrating  31 / 80 = 38.8 % or moderate disability.  Richell will benefit from skilled PT to address above deficits to improve mobility and activity tolerance with decreased pain interference.  OBJECTIVE IMPAIRMENTS: difficulty walking, decreased ROM, decreased strength, and pain.   ACTIVITY LIMITATIONS: lifting, bending, and locomotion level  PARTICIPATION LIMITATIONS: meal prep, cleaning, laundry, shopping, and community activity  PERSONAL FACTORS: Age and 1-2 comorbidities: Afib, HTN, anemia, OA, diabetes, neuropathy, recent R TKA 3/25,  are also affecting patient's functional outcome.   REHAB POTENTIAL: Good  CLINICAL DECISION MAKING: Evolving/moderate complexity  EVALUATION COMPLEXITY: Moderate   GOALS: Goals reviewed with patient? Yes  SHORT TERM GOALS: Target date: 03/14/2024   Patient will be independent with initial HEP to improve outcomes and carryover.  Baseline: 100% PT assist required for correct completion 02/18/24 added to HEP;  9/2:  can return demo initial HEP Goal status: MET  2.  Patient will report 25% improvement in neck and  back pain  to improve QOL. Baseline: 8/10 worst;  9/2:  0/10 today;  7/10 worst Goal status: MET  LONG TERM GOALS: Target date:  1017/25  Patient will be independent with ongoing/advanced HEP for self-management at home.  Baseline: no advanced HEP yet 02/18/24 advancing HEP Goal status: IN PROGRESS - 03/20/24 - HEP reviewed and updated/modified today.  2.  Patient will report 50-75% improvement in neck and  back pain to improve QOL.  Baseline: 8/10 worst;   9/2:  7/10 worst over the weekend 03/12/24:  States 0/10 pain last few days Goal status: IN PROGRESS  3.  Patient will demonstrate improved posture to decrease muscle imbalance. Baseline: flattened lumbar lordosis and increased upper thoracic kyphosis 9/5:  able to do POE today Goal status: IN PROGRESS   5.  Patient will demonstrate full pain free cervical ROM for safety with driving.  Baseline: see cervical ROM tables above 03/03/24:  25% limited for bilateral sidebending and rotation Goal status: IN PROGRESS  6.  Patient will demonstrate full pain free lumbar ROM to perform ADLs.   Baseline: see lumbar ROM table above Goal status: INITIAL  8.  Patient will report </= 20% on Quick Dash to demonstrate improved functional ability.  Baseline: 40.9 / 100 = 40.9 % Goal status: MET - 03/20/24 - 18.2 / 100 = 18.2 %  9.  Patient will report </= 60/80 on LEFS  to demonstrate improved functional ability.  Baseline: 31/80 03/12/24:  LEFS = 55/80 Goal status: IN PROGRESS  10.  Patient to report ability to perform ADLs, household, and work-related tasks without limitation due to cervical/lumbar/LLE pain, LOM or weakness Baseline: in pain most of the time with chores 03/12/24: Doing better with chores Goal status: IN PROGRESS   11.  Patient will tolerate 1hr of (standing/sitting/walking) to perform shopping. Baseline: can stand 1 hr, but in pain Goal status: INITIAL  12.  Patient will report centralization of radicular symptoms.  Baseline: pain runs down the LLE to the knee level 03/12/24:  no radicular symptoms in 2 weeks Goal status: MET PLAN:  PT FREQUENCY:  1-2x/week  PT DURATION: 8 weeks  PLANNED INTERVENTIONS: 97164- PT Re-evaluation, 97750- Physical Performance Testing, 97110-Therapeutic exercises, 97530- Therapeutic activity, 97112- Neuromuscular re-education, 97535- Self Care, 02859- Manual therapy, [id number]- Electrical stimulation (unattended), 97016- Vasopneumatic device, [id number]- Ultrasound, [id number]- Traction (mechanical), [id number]- Ionotophoresis 4mg /ml Dexamethasone , 79439 (1-2 muscles), 20561 (3+ muscles)- Dry Needling, Patient/Family education, Balance training, Stair training, Taping, Joint mobilization, Spinal mobilization, Cryotherapy, and Moist heat  PLAN FOR NEXT SESSION:  Medicare 10th visit PN; Continue elliptical as tolerated vs NuStep for warm-up, work on cervical flexibility and progression of postural strengthening; progress core/lumbopelvic and LE strengthening  Elijah CHRISTELLA Hidden, PT 03/20/2024, 11:17 AM

## 2024-03-23 ENCOUNTER — Encounter: Payer: Self-pay | Admitting: Medical-Surgical

## 2024-03-23 DIAGNOSIS — M5416 Radiculopathy, lumbar region: Secondary | ICD-10-CM

## 2024-03-23 DIAGNOSIS — M50123 Cervical disc disorder at C6-C7 level with radiculopathy: Secondary | ICD-10-CM

## 2024-03-23 DIAGNOSIS — R202 Paresthesia of skin: Secondary | ICD-10-CM

## 2024-03-24 MED ORDER — TIRZEPATIDE 2.5 MG/0.5ML ~~LOC~~ SOAJ: 2.5000 mg | SUBCUTANEOUS | 0 refills | Status: AC

## 2024-03-24 NOTE — Telephone Encounter (Signed)
 Patient seen 02/11/2024 with reports of cervical and lumbar radiculopathy. X-rays showed multilevel degenerative changes throughout. She has been treated with anti-inflammatories and physical therapy with only mild-moderate relief of symptoms. As she has tried and failed greater than 6 weeks of conservative measures, ordering MRI of the cervical and lumbar spine w/o contrast for further investigation and interventional planning.  ___________________________________________ Jean FREDRIK Palin, DNP, APRN, FNP-BC Primary Care and Sports Medicine [facility]

## 2024-03-25 ENCOUNTER — Other Ambulatory Visit: Payer: Self-pay | Admitting: Medical-Surgical

## 2024-03-26 DIAGNOSIS — K080 Exfoliation of teeth due to systemic causes: Secondary | ICD-10-CM | POA: Diagnosis not present

## 2024-03-27 ENCOUNTER — Ambulatory Visit: Attending: Medical-Surgical | Admitting: Rehabilitation

## 2024-03-27 ENCOUNTER — Encounter: Payer: Self-pay | Admitting: Rehabilitation

## 2024-03-27 DIAGNOSIS — R262 Difficulty in walking, not elsewhere classified: Secondary | ICD-10-CM | POA: Insufficient documentation

## 2024-03-27 DIAGNOSIS — M542 Cervicalgia: Secondary | ICD-10-CM | POA: Insufficient documentation

## 2024-03-27 DIAGNOSIS — M6281 Muscle weakness (generalized): Secondary | ICD-10-CM | POA: Insufficient documentation

## 2024-03-27 DIAGNOSIS — M79605 Pain in left leg: Secondary | ICD-10-CM | POA: Diagnosis not present

## 2024-03-27 NOTE — Therapy (Signed)
 OUTPATIENT PHYSICAL THERAPY CERVICAL AND THORACOLUMBAR TREATMENT   Patient Name: Jean Hopkins MRN: [id number] DOB:February 13, 1956, 68 y.o., female Today's Date: 02/14/2024   END OF SESSION:  PT End of Session - 03/27/24 1541     Visit Number 10    Date for Recertification  04/11/24    Authorization Type [employer]    Progress Note Due on Visit 10    PT Start Time 1537    PT Stop Time 1619    PT Time Calculation (min) 42 min    Activity Tolerance Patient tolerated treatment well    Behavior During Therapy WFL for tasks assessed/performed               Past Medical History:  Diagnosis Date   AF (atrial fibrillation) (HCC)    hyperthyroid induced   Allergy    Anemia    Arthritis 11/2020   Right knee   Blood transfusion without reported diagnosis    Cancer (HCC)    Melanoma x2   Cataract    Complication of anesthesia    Slow to wake up with general anesthesia   Controlled type 2 diabetes mellitus without complication, without long-term current use of insulin (HCC) 02/23/2022   no meds   GERD (gastroesophageal reflux disease)    Hypercholesterolemia 08/25/2022   Hypertension    Hyperthyroidism    Neuromuscular disorder (HCC)    neurpathy feet   Pneumonia    Past Surgical History:  Procedure Laterality Date   ABDOMINAL HYSTERECTOMY  2007   CHOLECYSTECTOMY  1988   TOTAL ABDOMINAL HYSTERECTOMY     TOTAL KNEE ARTHROPLASTY Right 09/13/2023   Procedure: ARTHROPLASTY, KNEE, TOTAL;  Surgeon: Ernie Cough, MD;  Location: [facility];  Service: Orthopedics;  Laterality: Right;   Patient Active Problem List   Diagnosis Date Noted   S/P total knee arthroplasty, right 09/13/2023   Postmenopausal 08/15/2023   Hypercholesterolemia 08/25/2022   Personal history of malignant melanoma of skin 08/25/2022   Vitamin D  deficiency 08/25/2022   Controlled type 2 diabetes mellitus without complication, without long-term current use of insulin (HCC) 02/23/2022   Hypertension 02/02/2021    Melanoma of skin (HCC) 02/02/2021   Raynaud's phenomenon 02/02/2021   Gastroesophageal reflux disease without esophagitis 01/14/2021   Osteoarthritis of right knee 01/04/2021   Hyperthyroidism    Atrial fibrillation (HCC) 07/29/2017    PCP: Willo Mini, NP   REFERRING PROVIDER: Willo Mini, NP   REFERRING DIAG: R20.2 (ICD-10-CM) - Paresthesia of left arm M25.552 (ICD-10-CM) - Left hip pain  THERAPY DIAG:  Pain in left leg  Muscle weakness (generalized)  Difficulty in walking, not elsewhere classified  RATIONALE FOR EVALUATION AND TREATMENT: Rehabilitation  ONSET DATE: 2 weeks ago LLE radicular pain  NEXT MD VISIT: TBD   SUBJECTIVE:  SUBJECTIVE STATEMENT: Patient states the L hip is bothering her today and rates pain 3/10.  Neck doesn't really bother her much anymore, but she has intermittent tingling/numb sensations into the LUE at times still.   She spoke with MD and has scheduled a neck and lumbar MRI.     68 y/o female referred to PT from PCP for new onset low back and LLE pain as well as LUE paresthesia.  LBP/LLE pain started 2 weeks ago for no apparent reason.  She reports numbness in the L arm and pain/aching from posterior L hip down the entire front and back of the LLE.  Prednisone  helped some.   She was requiring assistance to ambulate prior to taking the prednisone .  She is ambulating with no device now.  Taking Celebrex  again but was taking before. Neck pain:  states had some tingling feelings running from lower cspine up toward posterior skull last few months for no apparent reason.  Started having tingling/numbness in the LUE x 1 week that is all the way into her hand.   PAIN: Are you having pain? Yes: NPRS scale: 1/10  Pain location: low back  Pain description:  aching  Aggravating factors: uncertain  Relieving factors: rest, occasional Tylenol   PERTINENT HISTORY:  Afib, HTN, anemia, OA, diabetes, neuropathy, recent R TKA 3/25,   PRECAUTIONS: None  HAND DOMINANCE: Right  RED FLAGS: None  WEIGHT BEARING RESTRICTIONS: No  FALLS:  Has patient fallen in last 6 months? No  LIVING ENVIRONMENT: Lives with: lives with their family and lives with their spouse Lives in: House/apartment Stairs: Yes: External: 1 steps; none Has following equipment at home: Single point cane and Environmental consultant - 2 wheeled  OCCUPATION: retired Charity fundraiser, Stage manager for [employer]  PLOF: Independent with gait  PATIENT GOALS: not hurt in my neck and LLE    OBJECTIVE:   DIAGNOSTIC FINDINGS:  lumbar and cervical Xray results are pending in the system (done 02/11/24)  PATIENT SURVEYS:  Quick Dash = 40.9 / 100 = 40.9 % LEFS =  31 / 80 = 38.8 %  SCREENING FOR RED FLAGS: Bowel or bladder incontinence: No Spinal tumors: No Cauda equina syndrome: No Compression fracture: No Abdominal aneurysm: No  COGNITION: Overall cognitive status: Within functional limits for tasks assessed     SENSATION: WFL  POSTURE:  rounded shoulders, forward head, and increased thoracic kyphosis/upper thoracic; no scoliosis noted; very flattened lumbar lordosis and not much lower to mid thoracic kyphosis  PALPATION: TTP over the L cervical spine, UT, and levator;  TTP over the L piriformis and hamstrings  CERVICAL ROM:   Active ROM Eval 03/03/24  Flexion 100% full  Extension 60% full  Right lateral flexion 50% 25% limited  Left lateral flexion 50% 25% limited  Right rotation 75% 25% limited  Left rotation 60%; p! 25% limited     Repeated motions do not impact her pain level or LUE parasthesia  UPPER EXTREMITY ROM:  Active ROM Right eval Left eval  Shoulder flexion    Shoulder extension    Shoulder abduction    Shoulder adduction    Shoulder internal rotation     Shoulder external rotation    Elbow flexion    Elbow extension    Wrist flexion    Wrist extension    Wrist ulnar deviation    Wrist radial deviation    Wrist pronation    Wrist supination      (Blank rows = not tested)  UPPER  EXTREMITY MMT:  MMT Right eval Left eval  Shoulder flexion 180 180  Shoulder extension    Shoulder abduction    Shoulder adduction    Shoulder internal rotation    Shoulder external rotation    Middle trapezius    Lower trapezius    Elbow flexion    Elbow extension    Wrist flexion    Wrist extension    Wrist ulnar deviation    Wrist radial deviation    Wrist pronation    Wrist supination    Grip strength    (Blank rows = not tested)  LUMBAR ROM:   Active  Eval 03/03/24 03/27/24--painfree ROM  Flexion To midshins To ankles same  Extension 65%; some endrange p! 50% limited 75% ROM (30 deg inclinometer)  Right lateral flexion To knee joint To fibular head Same  Left lateral flexion Just above knee joint; p! Fibular head same  Right rotation 75% 40% limited 75%  Left rotation 60%; p! 40% limited 85%    (Blank rows = not tested)  MUSCLE LENGTH: Hamstrings: Right SLR 80 deg; Left SLR 70 deg Thomas test: Right 0 deg; Left 0 deg Hamstrings: 90/90 is 20 deg L; 30 deg R ITB: WNL BLE Piriformis: tight on L   LOWER EXTREMITY ROM:     Active  Right eval Left eval  Hip flexion    Hip extension    Hip abduction    Hip adduction    Hip internal rotation 30 20  Hip external rotation 40 35  Knee flexion    Knee extension    Ankle dorsiflexion    Ankle plantarflexion    Ankle inversion    Ankle eversion     (Blank rows = not tested)  LOWER EXTREMITY MMT:    MMT Right eval Left eval RLE 02/29/24 LLE 02/29/24 R 03/20/24 L 03/20/24          Hip flexion 5 4 5 5 5 5   Hip extension     5 4+  Hip abduction 4 4+ 4+ 4- 4+ 4  Hip adduction     4+ 4  Hip internal rotation 4 4- 4 4- 4+ 4+  Hip external rotation 4+ 4+ 4+ 4+ 4+ 4+  Knee flexion 5 5 5 5      Knee extension 5 5 5 5     Ankle dorsiflexion   5 5    Ankle plantarflexion 5 4 4 4  4+ 4  Ankle inversion        Ankle eversion          (Blank rows = not tested)  CERVICAL SPECIAL TESTS:  Upper limb tension test (ULTT): Negative, Spurling's test: Negative, and Distraction test: Positive;  Adson's test is negative LUE; ULTT negative LUE  LUMBAR SPECIAL TESTS:  Straight leg raise test: Negative, Slump test: Positive, SI Compression/distraction test: Negative, and FABER test: Positive;  LLE may be a little shorter in supine  FUNCTIONAL TESTS:  TBD  GAIT: Distance walked: into clinic distance Assistive device utilized: None Level of assistance: Complete Independence Gait pattern: decreased arm swing- Right, decreased arm swing- Left, decreased step length- Right, and decreased step length- Left Comments: slight antalgic LLE, supinates both feet throughout gait cycle   TODAY'S TREATMENT:  03/26/24 THERAPEUTIC EXERCISE: To improve strength.  Demonstration, verbal and tactile cues throughout for technique. Nustep L5 x 8' Cervical rotation SNAGs C3-7 x 10 L; x10 R Cervical extension w/ strap pull C3-7 x 3 w/ 3 sec holds each  level  THERAPEUTIC ACTIVITIES: To improve functional performance.  Demonstration, verbal and tactile cues throughout for technique.  Prone press up x 10  NEUROMUSCULAR RE-EDUCATION: To improve kinesthesia, posture, and proprioception. POE w/ cervical retraction x 10, retraction/L rot x 10;  retract/R rot x 10 Counter plank w/ bird dog alt UE/LE x 2/10 1/2 kneel on L knee and 2 airex pads w/ 5 sec no hands balance for stabilization x 10 reps  MANUAL THERAPY: To promote reduced pain utilizing myofascial release. Purple foam roll MFR to L hip in R SL  03/20/24 THERAPEUTIC EXERCISE: To improve strength, endurance, ROM, and flexibility.  Demonstration, verbal and tactile cues throughout for technique.  Elliptical - L1.0 x 3 min - limited by fatigue Cervical  extension SNAG with pillowcase x 10 B cervical rotation SNAGs with pillowcase x 10  THERAPEUTIC ACTIVITIES: To improve functional performance.  Demonstration, verbal and tactile cues throughout for technique. QuickDASH: 18.2 / 100 = 18.2 % LE MMT Standing heel raises B con/L ecc x 10  NEUROMUSCULAR RE-EDUCATION: To improve coordination, kinesthesia, posture, and proprioception. Standing plank on wall ladder + alt hip extension x 10 Standing plank position on counter + alt bird dog x 10 Seated RTB resisted cervical retraction x 10 POE cervical retraction x 10 - pt noting better muscle activation than with RTB  03/12/24 Nustep L5 x 8'  NEUROMUSCULAR RE-EDUCATION: To improve balance and proprioception. Reverse lunge w/ slider x 2/10 LLE Lateral lunge w/ slider x 2/10 LLE Step up w/ hip extension x 2/10 BLE Monster walk blue TB x 50' x 4 Wall squat w/ small swiss ball at back x 2/10   THERAPEUTIC ACTIVITIES: To improve functional performance.  Demonstration, verbal and tactile cues throughout for technique. Standing hip flexor stretch x 1' LLE Modified pigeon stretch x 1' x 2 LLE Manual PT assist ham stretch x 1' x 2 LLE   03/06/24 Therapeutic Exercise: Bike partial rev x 6 min Rhomboid stretch 5x5 Standing L levator stretch 1 min Therapeutic Activity: to improve functional performance Sliding clock reach R/L x 6  1/2 way around Sliding lunges x 10 BLE Sliding lateral lunges x 10 BLE Wall squat 2x10 Standing horiz ABD 3x10 YTB  Manual Therapy: STM to L infraspinatus, rhomboids, levator insertion   03/03/24 Nustep L4x61min Seated L figure 4 stretch x 1 min Seated L table pigeon stretch x 1 min TRX squats 2x10 TRX lateral lunges 2x10 Heel raise from 4' step x 20 Step up with hip ext x 10 BLE Leg press 25lb 2x10; R/L 15lb 2x10   PATIENT EDUCATION:  Education details: added levator stretch to HEP  Person educated: Patient Education method: Programmer, multimedia, Demonstration,  Verbal cues, Tactile cues, and Handouts Education comprehension: verbalized understanding, verbal cues required, tactile cues required, and needs further education  HOME EXERCISE PROGRAM: Access Code: [email or web address] URL: [email or web address] Date: 03/20/2024 Prepared by: Elijah Hidden  Exercises - Hooklying Single Knee to Chest Stretch  - 1 x daily - 7 x weekly - 1 sets - 10 reps - 3-5 sec hold - Supine Double Knee to Chest  - 1 x daily - 7 x weekly - 1 sets - 10 reps - 3-5 sec hold - Single Leg Bridge  - 1 x daily - 7 x weekly - 3 sets - 10 reps - Sidelying Hip Abduction  - 1 x daily - 7 x weekly - 2 sets - 10 reps - Side Plank on Knees  - 1 x daily - 7 x  weekly - 2 sets - 5 reps - 1-2 sec hold - Plank on Knees  - 1 x daily - 7 x weekly - 3 sets - 10 reps - Seated Hamstring Stretch  - 1 x daily - 7 x weekly - 1 sets - 2 reps - Seated Figure 4 Piriformis Stretch  - 1 x daily - 7 x weekly - 3 sets - 10 reps - Hip Abduction with Resistance Loop  - 1 x daily - 7 x weekly - 3 sets - 10 reps - Hip Extension with Resistance Loop  - 1 x daily - 7 x weekly - 3 sets - 10 reps - Seated Cervical Extension AROM  - 1 x daily - 7 x weekly - 3 sets - 10 reps - Standing Lumbar Extension  - 1 x daily - 7 x weekly - 1 sets - 10 reps - Gentle Levator Scapulae Stretch  - 1 x daily - 7 x weekly - 3 sets - 3 reps - 1 minute hold - Standing Supported Hip Extension at [employer]  - 1 x daily - 7 x weekly - 2 sets - 10 reps - 3 sec hold - Bird Dog on Counter  - 1 x daily - 7 x weekly - 2 sets - 10 reps - 3 sec hold - Cervical Retraction Prone on Elbows  - 1 x daily - 7 x weekly - 2 sets - 10 reps - 3 sec hold - Mid-Lower Cervical Extension SNAG with Strap  - 1 x daily - 7 x weekly - 2 sets - 10 reps - 3 sec hold - Seated Assisted Cervical Rotation with Towel  - 1 x daily - 7 x weekly - 2 sets - 10 reps - 3 sec hold   ASSESSMENT:  CLINICAL IMPRESSION: Patient has been seen x 10 visits for LUE paresthesia  and L hip pain.  She is making good progress to goals, but has not yet attained the goals that we have set for her.  However, she has good rehab potential to meet her goals.   Her LUE paresthesias are much improved and neck ROM is also much improved, but still some ROM deficits left to address.  Her L hip pain is also much better than initially but still nagging.  [place] and flared up the pain I think.   She used the scooter most of the time, but still had to do a fair amount of standing/walking.   Goal is for painfree ambulation.  LEFS has improved from 31/80 to 55/80 and quickDash has improved from 40 to 18% indicating significant subjective confidence and functional improvement in her symptoms.   PT is still necessary for strength, ROM, pain deficits.   Continue per POC.    EVAL: FRANCETTA ILG is a 68 y.o. female who was referred to physical therapy for evaluation and treatment for R20.2 (ICD-10-CM) - Paresthesia of left arm M25.552 (ICD-10-CM) - Left hip pain.     Patient reports onset of LLE pain beginning 2 weeks ago. Pain is worse with prolonged activities with arms or legs.   Standing or being active worsens.   Sitting reclined helps.  Patient has deficits in lumbar and cervical ROM, L hip and L shoulder flexibility, LLE strength, abnormal cervical and lumbar posture, and TTP with abnormal muscle tension in the paracervicals and L hip which are interfering with ADLs and are impacting quality of life.  On QuickDash  patient scored 40.9/50 demonstrating 40.9% or moderate disability.  On LEFS patient scored 31/80 demonstrating  31 / 80 = 38.8 % or moderate disability.  Sylvi will benefit from skilled PT to address above deficits to improve mobility and activity tolerance with decreased pain interference.  OBJECTIVE IMPAIRMENTS: difficulty walking, decreased ROM, decreased strength, and pain.   ACTIVITY LIMITATIONS: lifting, bending, and locomotion  level  PARTICIPATION LIMITATIONS: meal prep, cleaning, laundry, shopping, and community activity  PERSONAL FACTORS: Age and 1-2 comorbidities: Afib, HTN, anemia, OA, diabetes, neuropathy, recent R TKA 3/25,  are also affecting patient's functional outcome.   REHAB POTENTIAL: Good  CLINICAL DECISION MAKING: Evolving/moderate complexity  EVALUATION COMPLEXITY: Moderate   GOALS: Goals reviewed with patient? Yes  SHORT TERM GOALS: Target date: 03/14/2024   Patient will be independent with initial HEP to improve outcomes and carryover.  Baseline: 100% PT assist required for correct completion 02/18/24 added to HEP;  9/2:  can return demo initial HEP Goal status: MET  2.  Patient will report 25% improvement in neck and  back pain to improve QOL. Baseline: 8/10 worst;  9/2:  0/10 today;  7/10 worst Goal status: MET  LONG TERM GOALS: Target date: 1017/25  Patient will be independent with ongoing/advanced HEP for self-management at home.  Baseline: no advanced HEP yet 02/18/24 advancing HEP Goal status: IN PROGRESS - 03/20/24 - HEP reviewed and updated/modified today.  2.  Patient will report 50-75% improvement in neck and  back pain to improve QOL.  Baseline: 8/10 worst;   9/2:  7/10 worst over the weekend 03/12/24:  States 0/10 pain last few days 03/26/24:  3/10 in the R hip Goal status: IN PROGRESS  3.  Patient will demonstrate improved posture to decrease muscle imbalance. Baseline: flattened lumbar lordosis and increased upper thoracic kyphosis 9/5:  able to do POE today 03/26/24:  painfree prone press up Goal status: IN PROGRESS   5.  Patient will demonstrate full pain free cervical ROM for safety with driving.  Baseline: see cervical ROM tables above 03/03/24:  25% limited for bilateral sidebending and rotation Goal status: IN PROGRESS  6.  Patient will demonstrate full pain free lumbar ROM to perform ADLs.   Baseline: see lumbar ROM table above Goal status: INITIAL  8.   Patient will report </= 20% on Quick Dash to demonstrate improved functional ability.  Baseline: 40.9 / 100 = 40.9 % Goal status: MET - 03/20/24 - 18.2 / 100 = 18.2 %  9.  Patient will report </= 60/80 on LEFS  to demonstrate improved functional ability.  Baseline: 31/80 03/12/24:  LEFS = 55/80 Goal status: IN PROGRESS  10.  Patient to report ability to perform ADLs, household, and work-related tasks without limitation due to cervical/lumbar/LLE pain, LOM or weakness Baseline: in pain most of the time with chores 03/12/24: Doing better with chores Goal status: IN PROGRESS   11.  Patient will tolerate 1hr of (standing/sitting/walking) to perform shopping. Baseline: can stand 1 hr, but in pain Goal status: INITIAL  12.  Patient will report centralization of radicular symptoms.  Baseline: pain runs down the LLE to the knee level 03/12/24:  no radicular symptoms in 2 weeks Goal status: MET PLAN:  PT FREQUENCY: 1-2x/week  PT DURATION: 8 weeks  PLANNED INTERVENTIONS: 97164- PT Re-evaluation, 97750- Physical Performance Testing, 97110-Therapeutic exercises, 97530- Therapeutic activity, [id number]- Neuromuscular re-education, 97535- Self Care, [id number]- Manual therapy, [id number]- Electrical stimulation (unattended), 97016- Vasopneumatic device, [id number]- Ultrasound, [id number]- Traction (mechanical), [id number]- Ionotophoresis 4mg /ml  Dexamethasone , 20560 (1-2 muscles), 20561 (3+ muscles)- Dry Needling, Patient/Family education, Balance training, Stair training, Taping, Joint mobilization, Spinal mobilization, Cryotherapy, and Moist heat  PLAN FOR NEXT SESSION:  continue with cervical postural strengthening and stretching;  Add some more 1/2 kneeling activities if patient is able; plantar flexion strengthening  Eloise Picone, PT 03/27/2024, 5:40 PM

## 2024-03-30 ENCOUNTER — Ambulatory Visit

## 2024-03-30 DIAGNOSIS — M5416 Radiculopathy, lumbar region: Secondary | ICD-10-CM | POA: Diagnosis not present

## 2024-03-30 DIAGNOSIS — M4316 Spondylolisthesis, lumbar region: Secondary | ICD-10-CM | POA: Diagnosis not present

## 2024-03-30 DIAGNOSIS — M4726 Other spondylosis with radiculopathy, lumbar region: Secondary | ICD-10-CM | POA: Diagnosis not present

## 2024-03-30 DIAGNOSIS — M5116 Intervertebral disc disorders with radiculopathy, lumbar region: Secondary | ICD-10-CM | POA: Diagnosis not present

## 2024-03-30 DIAGNOSIS — M48061 Spinal stenosis, lumbar region without neurogenic claudication: Secondary | ICD-10-CM | POA: Diagnosis not present

## 2024-03-30 DIAGNOSIS — M50123 Cervical disc disorder at C6-C7 level with radiculopathy: Secondary | ICD-10-CM

## 2024-03-31 ENCOUNTER — Ambulatory Visit: Payer: Self-pay | Admitting: Medical-Surgical

## 2024-03-31 DIAGNOSIS — N2889 Other specified disorders of kidney and ureter: Secondary | ICD-10-CM

## 2024-03-31 DIAGNOSIS — M5416 Radiculopathy, lumbar region: Secondary | ICD-10-CM

## 2024-03-31 DIAGNOSIS — M50123 Cervical disc disorder at C6-C7 level with radiculopathy: Secondary | ICD-10-CM

## 2024-04-01 NOTE — Telephone Encounter (Signed)
 Pended referral

## 2024-04-01 NOTE — Addendum Note (Signed)
 Addended by: Sheriden Archibeque P on: 04/01/2024 01:53 PM   Modules accepted: Orders

## 2024-04-02 NOTE — Addendum Note (Signed)
 Addended byBETHA WILLO MINI on: 04/02/2024 02:28 PM   Modules accepted: Orders

## 2024-04-03 ENCOUNTER — Ambulatory Visit

## 2024-04-03 DIAGNOSIS — M79605 Pain in left leg: Secondary | ICD-10-CM | POA: Diagnosis not present

## 2024-04-03 DIAGNOSIS — M6281 Muscle weakness (generalized): Secondary | ICD-10-CM | POA: Diagnosis not present

## 2024-04-03 DIAGNOSIS — R262 Difficulty in walking, not elsewhere classified: Secondary | ICD-10-CM | POA: Diagnosis not present

## 2024-04-03 DIAGNOSIS — M542 Cervicalgia: Secondary | ICD-10-CM | POA: Diagnosis not present

## 2024-04-03 NOTE — Therapy (Signed)
 OUTPATIENT PHYSICAL THERAPY CERVICAL AND THORACOLUMBAR TREATMENT   Patient Name: Jean Hopkins MRN: [id number] DOB:1955-11-07, 67 y.o., female Today's Date: 02/14/2024   END OF SESSION:  PT End of Session - 04/03/24 1148     Visit Number 11    Date for Recertification  04/11/24    Authorization Type [employer]    Progress Note Due on Visit --    PT Start Time 1103    PT Stop Time 1147    PT Time Calculation (min) 44 min    Activity Tolerance Patient tolerated treatment well    Behavior During Therapy WFL for tasks assessed/performed                Past Medical History:  Diagnosis Date   AF (atrial fibrillation) (HCC)    hyperthyroid induced   Allergy    Anemia    Arthritis 11/2020   Right knee   Blood transfusion without reported diagnosis    Cancer (HCC)    Melanoma x2   Cataract    Complication of anesthesia    Slow to wake up with general anesthesia   Controlled type 2 diabetes mellitus without complication, without long-term current use of insulin (HCC) 02/23/2022   no meds   GERD (gastroesophageal reflux disease)    Hypercholesterolemia 08/25/2022   Hypertension    Hyperthyroidism    Neuromuscular disorder (HCC)    neurpathy feet   Pneumonia    Past Surgical History:  Procedure Laterality Date   ABDOMINAL HYSTERECTOMY  2007   CHOLECYSTECTOMY  1988   TOTAL ABDOMINAL HYSTERECTOMY     TOTAL KNEE ARTHROPLASTY Right 09/13/2023   Procedure: ARTHROPLASTY, KNEE, TOTAL;  Surgeon: Ernie Cough, MD;  Location: [facility];  Service: Orthopedics;  Laterality: Right;   Patient Active Problem List   Diagnosis Date Noted   S/P total knee arthroplasty, right 09/13/2023   Postmenopausal 08/15/2023   Hypercholesterolemia 08/25/2022   Personal history of malignant melanoma of skin 08/25/2022   Vitamin D  deficiency 08/25/2022   Controlled type 2 diabetes mellitus without complication, without long-term current use of insulin (HCC) 02/23/2022   Hypertension  02/02/2021   Melanoma of skin (HCC) 02/02/2021   Raynaud's phenomenon 02/02/2021   Gastroesophageal reflux disease without esophagitis 01/14/2021   Osteoarthritis of right knee 01/04/2021   Hyperthyroidism    Atrial fibrillation (HCC) 07/29/2017    PCP: Willo Mini, NP   REFERRING PROVIDER: Willo Mini, NP   REFERRING DIAG: R20.2 (ICD-10-CM) - Paresthesia of left arm M25.552 (ICD-10-CM) - Left hip pain  THERAPY DIAG:  Pain in left leg  Muscle weakness (generalized)  Difficulty in walking, not elsewhere classified  Cervicalgia  RATIONALE FOR EVALUATION AND TREATMENT: Rehabilitation  ONSET DATE: 2 weeks ago LLE radicular pain  NEXT MD VISIT: TBD   SUBJECTIVE:  SUBJECTIVE STATEMENT: Got MRI results  68 y/o female referred to PT from PCP for new onset low back and LLE pain as well as LUE paresthesia.  LBP/LLE pain started 2 weeks ago for no apparent reason.  She reports numbness in the L arm and pain/aching from posterior L hip down the entire front and back of the LLE.  Prednisone  helped some.   She was requiring assistance to ambulate prior to taking the prednisone .  She is ambulating with no device now.  Taking Celebrex  again but was taking before. Neck pain:  states had some tingling feelings running from lower cspine up toward posterior skull last few months for no apparent reason.  Started having tingling/numbness in the LUE x 1 week that is all the way into her hand.  PAIN: Are you having pain? Yes: NPRS scale: 1/10  Pain location: low back  Pain description: aching  Aggravating factors: uncertain  Relieving factors: rest, occasional Tylenol   PERTINENT HISTORY:  Afib, HTN, anemia, OA, diabetes, neuropathy, recent R TKA 3/25,   PRECAUTIONS: None  HAND DOMINANCE:  Right  RED FLAGS: None  WEIGHT BEARING RESTRICTIONS: No  FALLS:  Has patient fallen in last 6 months? No  LIVING ENVIRONMENT: Lives with: lives with their family and lives with their spouse Lives in: House/apartment Stairs: Yes: External: 1 steps; none Has following equipment at home: Single point cane and Environmental consultant - 2 wheeled  OCCUPATION: retired Charity fundraiser, Stage manager for [employer]  PLOF: Independent with gait  PATIENT GOALS: not hurt in my neck and LLE    OBJECTIVE:   DIAGNOSTIC FINDINGS:  lumbar and cervical Xray results are pending in the system (done 02/11/24)  PATIENT SURVEYS:  Quick Dash = 40.9 / 100 = 40.9 % LEFS =  31 / 80 = 38.8 %  SCREENING FOR RED FLAGS: Bowel or bladder incontinence: No Spinal tumors: No Cauda equina syndrome: No Compression fracture: No Abdominal aneurysm: No  COGNITION: Overall cognitive status: Within functional limits for tasks assessed     SENSATION: WFL  POSTURE:  rounded shoulders, forward head, and increased thoracic kyphosis/upper thoracic; no scoliosis noted; very flattened lumbar lordosis and not much lower to mid thoracic kyphosis  PALPATION: TTP over the L cervical spine, UT, and levator;  TTP over the L piriformis and hamstrings  CERVICAL ROM:   Active ROM Eval 03/03/24  Flexion 100% full  Extension 60% full  Right lateral flexion 50% 25% limited  Left lateral flexion 50% 25% limited  Right rotation 75% 25% limited  Left rotation 60%; p! 25% limited     Repeated motions do not impact her pain level or LUE parasthesia  UPPER EXTREMITY ROM:  Active ROM Right eval Left eval  Shoulder flexion    Shoulder extension    Shoulder abduction    Shoulder adduction    Shoulder internal rotation    Shoulder external rotation    Elbow flexion    Elbow extension    Wrist flexion    Wrist extension    Wrist ulnar deviation    Wrist radial deviation    Wrist pronation    Wrist supination      (Blank rows  = not tested)  UPPER EXTREMITY MMT:  MMT Right eval Left eval  Shoulder flexion 180 180  Shoulder extension    Shoulder abduction    Shoulder adduction    Shoulder internal rotation    Shoulder external rotation    Middle trapezius    Lower trapezius  Elbow flexion    Elbow extension    Wrist flexion    Wrist extension    Wrist ulnar deviation    Wrist radial deviation    Wrist pronation    Wrist supination    Grip strength    (Blank rows = not tested)  LUMBAR ROM:   Active  Eval 03/03/24 03/27/24--painfree ROM  Flexion To midshins To ankles same  Extension 65%; some endrange p! 50% limited 75% ROM (30 deg inclinometer)  Right lateral flexion To knee joint To fibular head Same  Left lateral flexion Just above knee joint; p! Fibular head same  Right rotation 75% 40% limited 75%  Left rotation 60%; p! 40% limited 85%    (Blank rows = not tested)  MUSCLE LENGTH: Hamstrings: Right SLR 80 deg; Left SLR 70 deg Thomas test: Right 0 deg; Left 0 deg Hamstrings: 90/90 is 20 deg L; 30 deg R ITB: WNL BLE Piriformis: tight on L   LOWER EXTREMITY ROM:     Active  Right eval Left eval  Hip flexion    Hip extension    Hip abduction    Hip adduction    Hip internal rotation 30 20  Hip external rotation 40 35  Knee flexion    Knee extension    Ankle dorsiflexion    Ankle plantarflexion    Ankle inversion    Ankle eversion     (Blank rows = not tested)  LOWER EXTREMITY MMT:    MMT Right eval Left eval RLE 02/29/24 LLE 02/29/24 R 03/20/24 L 03/20/24          Hip flexion 5 4 5 5 5 5   Hip extension     5 4+  Hip abduction 4 4+ 4+ 4- 4+ 4  Hip adduction     4+ 4  Hip internal rotation 4 4- 4 4- 4+ 4+  Hip external rotation 4+ 4+ 4+ 4+ 4+ 4+  Knee flexion 5 5 5 5     Knee extension 5 5 5 5     Ankle dorsiflexion   5 5    Ankle plantarflexion 5 4 4 4  4+ 4  Ankle inversion        Ankle eversion          (Blank rows = not tested)  CERVICAL SPECIAL TESTS:  Upper limb  tension test (ULTT): Negative, Spurling's test: Negative, and Distraction test: Positive;  Adson's test is negative LUE; ULTT negative LUE  LUMBAR SPECIAL TESTS:  Straight leg raise test: Negative, Slump test: Positive, SI Compression/distraction test: Negative, and FABER test: Positive;  LLE may be a little shorter in supine  FUNCTIONAL TESTS:  TBD  GAIT: Distance walked: into clinic distance Assistive device utilized: None Level of assistance: Complete Independence Gait pattern: decreased arm swing- Right, decreased arm swing- Left, decreased step length- Right, and decreased step length- Left Comments: slight antalgic LLE, supinates both feet throughout gait cycle   TODAY'S TREATMENT:  04/03/24 THERAPEUTIC EXERCISE: To improve strength.  Demonstration, verbal and tactile cues throughout for technique. Nustep L5 x 8' Cervical rotation SNAGs C3-7 x 10 L; x10 R Cervical extension w/ strap pull C3-7 x 3 w/ 3 sec holds each level   NEUROMUSCULAR RE-EDUCATION: To improve kinesthesia, posture, and proprioception. Chin tucks 2x10 5 sec hold Chin tuck with CW/CCW circles on wall x 20 Chin tuck + wall push up 2x10 Seated trunk flexion black TB 2x10   MANUAL THERAPY: To promote reduced pain utilizing myofascial release. Massage  gun and TPR to L VL, piriformis, lateral hamstrings  03/26/24 THERAPEUTIC EXERCISE: To improve strength.  Demonstration, verbal and tactile cues throughout for technique. Nustep L5 x 8' Cervical rotation SNAGs C3-7 x 10 L; x10 R Cervical extension w/ strap pull C3-7 x 3 w/ 3 sec holds each level  THERAPEUTIC ACTIVITIES: To improve functional performance.  Demonstration, verbal and tactile cues throughout for technique.  Prone press up x 10  NEUROMUSCULAR RE-EDUCATION: To improve kinesthesia, posture, and proprioception. POE w/ cervical retraction x 10, retraction/L rot x 10;  retract/R rot x 10 Counter plank w/ bird dog alt UE/LE x 2/10 1/2 kneel on L knee  and 2 airex pads w/ 5 sec no hands balance for stabilization x 10 reps  MANUAL THERAPY: To promote reduced pain utilizing myofascial release. Purple foam roll MFR to L hip in R SL  03/20/24 THERAPEUTIC EXERCISE: To improve strength, endurance, ROM, and flexibility.  Demonstration, verbal and tactile cues throughout for technique.  Elliptical - L1.0 x 3 min - limited by fatigue Cervical extension SNAG with pillowcase x 10 B cervical rotation SNAGs with pillowcase x 10  THERAPEUTIC ACTIVITIES: To improve functional performance.  Demonstration, verbal and tactile cues throughout for technique. QuickDASH: 18.2 / 100 = 18.2 % LE MMT Standing heel raises B con/L ecc x 10  NEUROMUSCULAR RE-EDUCATION: To improve coordination, kinesthesia, posture, and proprioception. Standing plank on wall ladder + alt hip extension x 10 Standing plank position on counter + alt bird dog x 10 Seated RTB resisted cervical retraction x 10 POE cervical retraction x 10 - pt noting better muscle activation than with RTB  03/12/24 Nustep L5 x 8'  NEUROMUSCULAR RE-EDUCATION: To improve balance and proprioception. Reverse lunge w/ slider x 2/10 LLE Lateral lunge w/ slider x 2/10 LLE Step up w/ hip extension x 2/10 BLE Monster walk blue TB x 50' x 4 Wall squat w/ small swiss ball at back x 2/10   THERAPEUTIC ACTIVITIES: To improve functional performance.  Demonstration, verbal and tactile cues throughout for technique. Standing hip flexor stretch x 1' LLE Modified pigeon stretch x 1' x 2 LLE Manual PT assist ham stretch x 1' x 2 LLE   03/06/24 Therapeutic Exercise: Bike partial rev x 6 min Rhomboid stretch 5x5 Standing L levator stretch 1 min Therapeutic Activity: to improve functional performance Sliding clock reach R/L x 6  1/2 way around Sliding lunges x 10 BLE Sliding lateral lunges x 10 BLE Wall squat 2x10 Standing horiz ABD 3x10 YTB  Manual Therapy: STM to L infraspinatus, rhomboids, levator  insertion   03/03/24 Nustep L4x14min Seated L figure 4 stretch x 1 min Seated L table pigeon stretch x 1 min TRX squats 2x10 TRX lateral lunges 2x10 Heel raise from 4' step x 20 Step up with hip ext x 10 BLE Leg press 25lb 2x10; R/L 15lb 2x10   PATIENT EDUCATION:  Education details: added levator stretch to HEP  Person educated: Patient Education method: Programmer, multimedia, Demonstration, Verbal cues, Tactile cues, and Handouts Education comprehension: verbalized understanding, verbal cues required, tactile cues required, and needs further education  HOME EXERCISE PROGRAM: Access Code: [email or web address] URL: [email or web address] Date: 03/20/2024 Prepared by: Elijah Hidden  Exercises - Hooklying Single Knee to Chest Stretch  - 1 x daily - 7 x weekly - 1 sets - 10 reps - 3-5 sec hold - Supine Double Knee to Chest  - 1 x daily - 7 x weekly - 1 sets - 10 reps - 3-5 sec hold -  Single Leg Bridge  - 1 x daily - 7 x weekly - 3 sets - 10 reps - Sidelying Hip Abduction  - 1 x daily - 7 x weekly - 2 sets - 10 reps - Side Plank on Knees  - 1 x daily - 7 x weekly - 2 sets - 5 reps - 1-2 sec hold - Plank on Knees  - 1 x daily - 7 x weekly - 3 sets - 10 reps - Seated Hamstring Stretch  - 1 x daily - 7 x weekly - 1 sets - 2 reps - Seated Figure 4 Piriformis Stretch  - 1 x daily - 7 x weekly - 3 sets - 10 reps - Hip Abduction with Resistance Loop  - 1 x daily - 7 x weekly - 3 sets - 10 reps - Hip Extension with Resistance Loop  - 1 x daily - 7 x weekly - 3 sets - 10 reps - Seated Cervical Extension AROM  - 1 x daily - 7 x weekly - 3 sets - 10 reps - Standing Lumbar Extension  - 1 x daily - 7 x weekly - 1 sets - 10 reps - Gentle Levator Scapulae Stretch  - 1 x daily - 7 x weekly - 3 sets - 3 reps - 1 minute hold - Standing Supported Hip Extension at [employer]  - 1 x daily - 7 x weekly - 2 sets - 10 reps - 3 sec hold - Bird Dog on Counter  - 1 x daily - 7 x weekly - 2 sets - 10 reps - 3 sec hold -  Cervical Retraction Prone on Elbows  - 1 x daily - 7 x weekly - 2 sets - 10 reps - 3 sec hold - Mid-Lower Cervical Extension SNAG with Strap  - 1 x daily - 7 x weekly - 2 sets - 10 reps - 3 sec hold - Seated Assisted Cervical Rotation with Towel  - 1 x daily - 7 x weekly - 2 sets - 10 reps - 3 sec hold   ASSESSMENT:  CLINICAL IMPRESSION: Patient had recent MRI showing some moderate cerv DDD with some mild lumbar spondylolisthesis and foramen narrowing. Still reports occasional N/T down L arm. Worked on more cervical stabilization today for the spine with flexion exercises for the back. Taut bands and tender points found in L piriformis, VL, and lateral hamstrings. Shows some improvement, notes that she will get CT scan for Lumbar to see about complex lesion of R kidney. Will need to re-evaluate next week   EVAL: Jean Hopkins is a 68 y.o. female who was referred to physical therapy for evaluation and treatment for R20.2 (ICD-10-CM) - Paresthesia of left arm M25.552 (ICD-10-CM) - Left hip pain.     Patient reports onset of LLE pain beginning 2 weeks ago. Pain is worse with prolonged activities with arms or legs.   Standing or being active worsens.   Sitting reclined helps.  Patient has deficits in lumbar and cervical ROM, L hip and L shoulder flexibility, LLE strength, abnormal cervical and lumbar posture, and TTP with abnormal muscle tension in the paracervicals and L hip which are interfering with ADLs and are impacting quality of life.  On QuickDash patient scored 40.9/50 demonstrating 40.9% or moderate disability.  On LEFS patient scored 31/80 demonstrating  31 / 80 = 38.8 % or moderate disability.  Tanishi will benefit from skilled PT to address above deficits to improve mobility and activity tolerance with decreased  pain interference.  OBJECTIVE IMPAIRMENTS: difficulty walking, decreased ROM, decreased strength, and pain.   ACTIVITY LIMITATIONS: lifting, bending, and locomotion level  PARTICIPATION  LIMITATIONS: meal prep, cleaning, laundry, shopping, and community activity  PERSONAL FACTORS: Age and 1-2 comorbidities: Afib, HTN, anemia, OA, diabetes, neuropathy, recent R TKA 3/25,  are also affecting patient's functional outcome.   REHAB POTENTIAL: Good  CLINICAL DECISION MAKING: Evolving/moderate complexity  EVALUATION COMPLEXITY: Moderate   GOALS: Goals reviewed with patient? Yes  SHORT TERM GOALS: Target date: 03/14/2024   Patient will be independent with initial HEP to improve outcomes and carryover.  Baseline: 100% PT assist required for correct completion 02/18/24 added to HEP;  9/2:  can return demo initial HEP Goal status: MET  2.  Patient will report 25% improvement in neck and  back pain to improve QOL. Baseline: 8/10 worst;  9/2:  0/10 today;  7/10 worst Goal status: MET  LONG TERM GOALS: Target date: 1017/25  Patient will be independent with ongoing/advanced HEP for self-management at home.  Baseline: no advanced HEP yet 02/18/24 advancing HEP Goal status: IN PROGRESS - 03/20/24 - HEP reviewed and updated/modified today.  2.  Patient will report 50-75% improvement in neck and  back pain to improve QOL.  Baseline: 8/10 worst;   9/2:  7/10 worst over the weekend 03/12/24:  States 0/10 pain last few days 03/26/24:  3/10 in the R hip Goal status: IN PROGRESS  3.  Patient will demonstrate improved posture to decrease muscle imbalance. Baseline: flattened lumbar lordosis and increased upper thoracic kyphosis 9/5:  able to do POE today 03/26/24:  painfree prone press up Goal status: IN PROGRESS   5.  Patient will demonstrate full pain free cervical ROM for safety with driving.  Baseline: see cervical ROM tables above 03/03/24:  25% limited for bilateral sidebending and rotation Goal status: IN PROGRESS  6.  Patient will demonstrate full pain free lumbar ROM to perform ADLs.   Baseline: see lumbar ROM table above Goal status: INITIAL  8.  Patient will report  </= 20% on Quick Dash to demonstrate improved functional ability.  Baseline: 40.9 / 100 = 40.9 % Goal status: MET - 03/20/24 - 18.2 / 100 = 18.2 %  9.  Patient will report </= 60/80 on LEFS  to demonstrate improved functional ability.  Baseline: 31/80 03/12/24:  LEFS = 55/80 Goal status: IN PROGRESS  10.  Patient to report ability to perform ADLs, household, and work-related tasks without limitation due to cervical/lumbar/LLE pain, LOM or weakness Baseline: in pain most of the time with chores 03/12/24: Doing better with chores Goal status: IN PROGRESS - 04/03/24 able but still some pain  11.  Patient will tolerate 1hr of (standing/sitting/walking) to perform shopping. Baseline: can stand 1 hr, but in pain Goal status: IN PROGRESS- 04/03/24 can stand but with pain   12.  Patient will report centralization of radicular symptoms.  Baseline: pain runs down the LLE to the knee level 03/12/24:  no radicular symptoms in 2 weeks Goal status: MET PLAN:  PT FREQUENCY: 1-2x/week  PT DURATION: 8 weeks  PLANNED INTERVENTIONS: 97164- PT Re-evaluation, 97750- Physical Performance Testing, 97110-Therapeutic exercises, 97530- Therapeutic activity, [id number]- Neuromuscular re-education, 97535- Self Care, 02859- Manual therapy, [id number]- Electrical stimulation (unattended), 97016- Vasopneumatic device, [id number]- Ultrasound, [id number]- Traction (mechanical), [id number]- Ionotophoresis 4mg /ml Dexamethasone , 79439 (1-2 muscles), 20561 (3+ muscles)- Dry Needling, Patient/Family education, Balance training, Stair training, Taping, Joint mobilization, Spinal mobilization, Cryotherapy, and Moist heat  PLAN FOR NEXT SESSION:  reassess; continue with cervical postural strengthening and stretching;  Add some more 1/2 kneeling activities if patient is able; plantar flexion strengthening  Denell Cothern L Suesan Mohrmann, PTA 04/03/2024, 1:00 PM

## 2024-04-07 ENCOUNTER — Ambulatory Visit (INDEPENDENT_AMBULATORY_CARE_PROVIDER_SITE_OTHER)

## 2024-04-07 DIAGNOSIS — N2889 Other specified disorders of kidney and ureter: Secondary | ICD-10-CM | POA: Diagnosis not present

## 2024-04-07 DIAGNOSIS — Z9049 Acquired absence of other specified parts of digestive tract: Secondary | ICD-10-CM | POA: Diagnosis not present

## 2024-04-07 DIAGNOSIS — K5730 Diverticulosis of large intestine without perforation or abscess without bleeding: Secondary | ICD-10-CM | POA: Diagnosis not present

## 2024-04-07 MED ORDER — IOHEXOL 300 MG/ML  SOLN
200.0000 mL | Freq: Once | INTRAMUSCULAR | Status: AC | PRN
  Administered 2024-04-07: 125 mL via INTRAVENOUS

## 2024-04-09 ENCOUNTER — Encounter: Payer: Self-pay | Admitting: Rehabilitation

## 2024-04-09 ENCOUNTER — Ambulatory Visit: Admitting: Rehabilitation

## 2024-04-09 ENCOUNTER — Encounter: Payer: Self-pay | Admitting: Medical-Surgical

## 2024-04-09 ENCOUNTER — Ambulatory Visit: Payer: Self-pay | Admitting: Medical-Surgical

## 2024-04-09 ENCOUNTER — Telehealth: Payer: Self-pay

## 2024-04-09 DIAGNOSIS — M79605 Pain in left leg: Secondary | ICD-10-CM | POA: Diagnosis not present

## 2024-04-09 DIAGNOSIS — M542 Cervicalgia: Secondary | ICD-10-CM

## 2024-04-09 DIAGNOSIS — R262 Difficulty in walking, not elsewhere classified: Secondary | ICD-10-CM | POA: Diagnosis not present

## 2024-04-09 DIAGNOSIS — M6281 Muscle weakness (generalized): Secondary | ICD-10-CM | POA: Diagnosis not present

## 2024-04-09 DIAGNOSIS — N281 Cyst of kidney, acquired: Secondary | ICD-10-CM

## 2024-04-09 NOTE — Telephone Encounter (Signed)
 CT results showing in patient chart but not yet reviewed by provider

## 2024-04-09 NOTE — Telephone Encounter (Signed)
 Copied from CRM [id number]. Topic: Clinical - Lab/Test Results >> Apr 09, 2024  8:00 AM Montie POUR wrote: Reason for CRM:  Keyara is calling back for her test results. I do not see any notes in her chart. Please call her back at [phone] to review test results. Thanks

## 2024-04-09 NOTE — Therapy (Addendum)
 " OUTPATIENT PHYSICAL THERAPY CERVICAL AND THORACOLUMBAR TREATMENT / DC SUMMARY   Patient Name: Jean Hopkins MRN: [id number] DOB:11-30-1955, 68 y.o., female Today's Date: 02/14/2024   END OF SESSION:  PT End of Session - 04/09/24 1316     Visit Number 12    Date for Recertification  04/11/24    Authorization Type [employer]    PT Start Time 1318    PT Stop Time 1400    PT Time Calculation (min) 42 min    Activity Tolerance Patient tolerated treatment well    Behavior During Therapy WFL for tasks assessed/performed                Past Medical History:  Diagnosis Date   AF (atrial fibrillation) (HCC)    hyperthyroid induced   Allergy    Anemia    Arthritis 11/2020   Right knee   Blood transfusion without reported diagnosis    Cancer (HCC)    Melanoma x2   Cataract    Complication of anesthesia    Slow to wake up with general anesthesia   Controlled type 2 diabetes mellitus without complication, without long-term current use of insulin  (HCC) 02/23/2022   no meds   GERD (gastroesophageal reflux disease)    Hypercholesterolemia 08/25/2022   Hypertension    Hyperthyroidism    Neuromuscular disorder (HCC)    neurpathy feet   Pneumonia    Past Surgical History:  Procedure Laterality Date   ABDOMINAL HYSTERECTOMY  2007   CHOLECYSTECTOMY  1988   TOTAL ABDOMINAL HYSTERECTOMY     TOTAL KNEE ARTHROPLASTY Right 09/13/2023   Procedure: ARTHROPLASTY, KNEE, TOTAL;  Surgeon: Ernie Cough, MD;  Location: [facility];  Service: Orthopedics;  Laterality: Right;   Patient Active Problem List   Diagnosis Date Noted   S/P total knee arthroplasty, right 09/13/2023   Postmenopausal 08/15/2023   Hypercholesterolemia 08/25/2022   Personal history of malignant melanoma of skin 08/25/2022   Vitamin D  deficiency 08/25/2022   Controlled type 2 diabetes mellitus without complication, without long-term current use of insulin  (HCC) 02/23/2022   Hypertension 02/02/2021   Melanoma of  skin (HCC) 02/02/2021   Raynaud's phenomenon 02/02/2021   Gastroesophageal reflux disease without esophagitis 01/14/2021   Osteoarthritis of right knee 01/04/2021   Hyperthyroidism    Atrial fibrillation (HCC) 07/29/2017    PCP: Willo Mini, NP   REFERRING PROVIDER: Willo Mini, NP   REFERRING DIAG: R20.2 (ICD-10-CM) - Paresthesia of left arm M25.552 (ICD-10-CM) - Left hip pain  THERAPY DIAG:  Pain in left leg  Muscle weakness (generalized)  Difficulty in walking, not elsewhere classified  Cervicalgia  RATIONALE FOR EVALUATION AND TREATMENT: Rehabilitation  ONSET DATE: 2 weeks ago LLE radicular pain  NEXT MD VISIT: TBD   SUBJECTIVE:  SUBJECTIVE STATEMENT: Patient reports she her lumbar MRI showed a spot on her kidney and she has had a CT scan and will be following up with urologist next week regarding likely renal cell carcinoma.   Her cervical MRI showed a L sided disc herniation with foraminal narrowing and some thecal sac indentation and she is going to see a Dr Joshua with neurosurgery in [place] next week as well.   She would like to hold on PT Until after she has seen neurosurgeon.   She feels about 75% improved with her LLE and back pain.   68 y/o female referred to PT from PCP for new onset low back and LLE pain as well as LUE paresthesia.  LBP/LLE pain started 2 weeks ago for no apparent reason.  She reports numbness in the L arm and pain/aching from posterior L hip down the entire front and back of the LLE.  Prednisone  helped some.   She was requiring assistance to ambulate prior to taking the prednisone .  She is ambulating with no device now.  Taking Celebrex  again but was taking before. Neck pain:  states had some tingling feelings running from lower cspine up toward posterior  skull last few months for no apparent reason.  Started having tingling/numbness in the LUE x 1 week that is all the way into her hand.  PAIN: Are you having pain? Yes: NPRS scale: 1/10  Pain location: low back  Pain description: aching  Aggravating factors: uncertain  Relieving factors: rest, occasional Tylenol   PERTINENT HISTORY:  Afib, HTN, anemia, OA, diabetes, neuropathy, recent R TKA 3/25,   PRECAUTIONS: None  HAND DOMINANCE: Right  RED FLAGS: None  WEIGHT BEARING RESTRICTIONS: No  FALLS:  Has patient fallen in last 6 months? No  LIVING ENVIRONMENT: Lives with: lives with their family and lives with their spouse Lives in: House/apartment Stairs: Yes: External: 1 steps; none Has following equipment at home: Single point cane and Environmental Consultant - 2 wheeled  OCCUPATION: retired [employer], Stage manager for [employer]  PLOF: Independent with gait  PATIENT GOALS: not hurt in my neck and LLE    OBJECTIVE:   DIAGNOSTIC FINDINGS:  lumbar and cervical Xray results are pending in the system (done 02/11/24)  PATIENT SURVEYS:   Quick Dash:  QUICK DASH  Please rate your ability do the following activities in the last week by selecting the number below the appropriate response.   Activities Rating  Open a tight or new jar.  2 = Mild difficulty  Do heavy household chores (e.g., wash walls, floors). 3 = Moderate difficulty  Carry a shopping bag or briefcase 1 = No difficulty   Wash your back. 1 = No difficulty   Use a knife to cut food. 1 = No difficulty   Recreational activities in which you take some force or impact through your arm, shoulder or hand (e.g., golf, hammering, tennis, etc.). 3 = Moderate difficulty  During the past week, to what extent has your arm, shoulder or hand problem interfered with your normal social activities with family, friends, neighbors or groups?  2 = Slightly  During the past week, were you limited in your work or other regular daily  activities as a result of your arm, shoulder or hand problem? 2 = Slightly limited  Rate the severity of the following symptoms in the last week: Arm, Shoulder, or hand pain. 2 = Mild  Rate the severity of the following symptoms in the last week: Tingling (pins and  needles) in your arm, shoulder or hand. 3 = Moderate  During the past week, how much difficulty have you had sleeping because of the pain in your arm, shoulder or hand?  3 = Moderate difficulty   (A QuickDASH score may not be calculated if there is greater than 1 missing item.)  Quick Dash Disability/Symptom Score: [(sum of 23 (n) responses/11 (n)] x 25 = 27   Minimally Clinically Important Difference (MCID): 15-20 points  (Franchignoni, F. et al. (2013). Minimally clinically important difference of the disabilities of the arm, shoulder, and hand outcome measures (DASH) and its shortened version (Quick DASH). Journal of Orthopaedic & Sports Physical Therapy, 44(1), 30-39)   LEFS  Extreme difficulty/unable (0), Quite a bit of difficulty (1), Moderate difficulty (2), Little difficulty (3), No difficulty (4) Survey date:  04/09/2024  Any of your usual work, housework or school activities 3  2. Usual hobbies, recreational or sporting activities 2  3. Getting into/out of the bath 4  4. Walking between rooms 4  5. Putting on socks/shoes 4  6. Squatting  3  7. Lifting an object, like a bag of groceries from the floor 4  8. Performing light activities around your home 4  9. Performing heavy activities around your home 2  10. Getting into/out of a car 4  11. Walking 2 blocks 3  12. Walking 1 mile 2  13. Going up/down 10 stairs (1 flight) 3  14. Standing for 1 hour 3  15.  sitting for 1 hour 4  16. Running on even ground 1  17. Running on uneven ground 1  18. Making sharp turns while running fast 0  19. Hopping  1  20. Rolling over in bed 4  Score total:  56      SCREENING FOR RED FLAGS: Bowel or bladder incontinence:  No Spinal tumors: No Cauda equina syndrome: No Compression fracture: No Abdominal aneurysm: No  COGNITION: Overall cognitive status: Within functional limits for tasks assessed     SENSATION: WFL  POSTURE:  rounded shoulders, forward head, and increased thoracic kyphosis/upper thoracic; no scoliosis noted; very flattened lumbar lordosis and not much lower to mid thoracic kyphosis  PALPATION: TTP over the L cervical spine, UT, and levator;  TTP over the L piriformis and hamstrings  CERVICAL ROM:   Active ROM Eval 03/03/24 04/09/24  Flexion 100% full 100%  Extension 60% full 100%  Right lateral flexion 50% 25% limited 60%  Left lateral flexion 50% 25% limited 60%  Right rotation 75% 25% limited 70%  Left rotation 60%; p! 25% limited 70%     Repeated motions do not impact her pain level or LUE parasthesia  UPPER EXTREMITY ROM:  Active ROM Right eval Left eval  Shoulder flexion    Shoulder extension    Shoulder abduction    Shoulder adduction    Shoulder internal rotation    Shoulder external rotation    Elbow flexion    Elbow extension    Wrist flexion    Wrist extension    Wrist ulnar deviation    Wrist radial deviation    Wrist pronation    Wrist supination      (Blank rows = not tested)  UPPER EXTREMITY MMT:  MMT Right eval Left eval  Shoulder flexion 180 180  Shoulder extension    Shoulder abduction    Shoulder adduction    Shoulder internal rotation    Shoulder external rotation    Middle trapezius  Lower trapezius    Elbow flexion    Elbow extension    Wrist flexion    Wrist extension    Wrist ulnar deviation    Wrist radial deviation    Wrist pronation    Wrist supination    Grip strength    (Blank rows = not tested)  LUMBAR ROM:   Active  Eval 03/03/24 03/27/24--painfree ROM 04/09/24  Flexion To midshins To ankles same same  Extension 65%; some endrange p! 50% limited 75% ROM (30 deg inclinometer) 80%  Right lateral flexion To knee joint  To fibular head Same same  Left lateral flexion Just above knee joint; p! Fibular head same same  Right rotation 75% 40% limited 75% 80%  Left rotation 60%; p! 40% limited 85% 80%    (Blank rows = not tested)  MUSCLE LENGTH: Hamstrings: Right SLR 80 deg; Left SLR 70 deg Thomas test: Right 0 deg; Left 0 deg Hamstrings: 90/90 is 20 deg L; 30 deg R ITB: WNL BLE Piriformis: tight on L   LOWER EXTREMITY ROM:     Active  Right eval Left eval  Hip flexion    Hip extension    Hip abduction    Hip adduction    Hip internal rotation 30 20  Hip external rotation 40 35  Knee flexion    Knee extension    Ankle dorsiflexion    Ankle plantarflexion    Ankle inversion    Ankle eversion     (Blank rows = not tested)  LOWER EXTREMITY MMT:    MMT Right eval Left eval RLE 02/29/24 LLE 02/29/24 R 03/20/24 L 03/20/24          Hip flexion 5 4 5 5 5 5   Hip extension     5 4+  Hip abduction 4 4+ 4+ 4- 4+ 4  Hip adduction     4+ 4  Hip internal rotation 4 4- 4 4- 4+ 4+  Hip external rotation 4+ 4+ 4+ 4+ 4+ 4+  Knee flexion 5 5 5 5     Knee extension 5 5 5 5     Ankle dorsiflexion   5 5    Ankle plantarflexion 5 4 4 4  4+ 4  Ankle inversion        Ankle eversion          (Blank rows = not tested)  CERVICAL SPECIAL TESTS:  Upper limb tension test (ULTT): Negative, Spurling's test: Negative, and Distraction test: Positive;  Adson's test is negative LUE; ULTT negative LUE  LUMBAR SPECIAL TESTS:  Straight leg raise test: Negative, Slump test: Positive, SI Compression/distraction test: Negative, and FABER test: Positive;  LLE may be a little shorter in supine  FUNCTIONAL TESTS:  TBD  GAIT: Distance walked: into clinic distance Assistive device utilized: None Level of assistance: Complete Independence Gait pattern: decreased arm swing- Right, decreased arm swing- Left, decreased step length- Right, and decreased step length- Left Comments: slight antalgic LLE, supinates both feet throughout  gait cycle   TODAY'S TREATMENT:  04/09/24 THERAPEUTIC EXERCISE: To improve strength.  Demonstration, verbal and tactile cues throughout for technique. Nustep L5 x 8' Cervical rotation SNAGs C3-7 x 10 L; x10 R Cervical extension w/ strap pull C3-7 x 3 w/ 3 sec holds each level  THERAPEUTIC ACTIVITIES: To improve functional performance.  Demonstration, verbal and tactile cues throughout for technique. Upper thoracic mobilizations w/ cervical extension over back of chair x 10 Upper thoracic mobliizations w/ cervical extensions over foam roll  x 10 Doorway pec stretch x 1' x 2 R cervical SB stretch x 1' x 2 Cervical retraction w/ counter push up x 2/10 BUE  MANUAL THERAPY: To promote reduced pain utilizing joint mobilization. Grade 3-4 joint mobilization to C3-7 for B rotation and B sideglides x 10-15 reps each direction/each level  MODALITIES: Intermittent cervical traction @ 30 lb pull x 60 sec on/20 sec off x 15'   04/03/24 THERAPEUTIC EXERCISE: To improve strength.  Demonstration, verbal and tactile cues throughout for technique. Nustep L5 x 8' Cervical rotation SNAGs C3-7 x 10 L; x10 R Cervical extension w/ strap pull C3-7 x 3 w/ 3 sec holds each level   NEUROMUSCULAR RE-EDUCATION: To improve kinesthesia, posture, and proprioception. Chin tucks 2x10 5 sec hold Chin tuck with CW/CCW circles on wall x 20 Chin tuck + wall push up 2x10 Seated trunk flexion black TB 2x10   MANUAL THERAPY: To promote reduced pain utilizing myofascial release. Massage gun and TPR to L VL, piriformis, lateral hamstrings  PATIENT EDUCATION:  Education details: HEP review, HEP update, and added levator stretch to HEP  Person educated: Patient Education method: Explanation, Demonstration, Verbal cues, Tactile cues, and Handouts Education comprehension: verbalized understanding, verbal cues required, tactile cues required, and needs further education  HOME EXERCISE PROGRAM: Access Code:  [email or web address] URL: [email or web address] Date: 03/20/2024 Prepared by: Elijah Hidden  Exercises - Hooklying Single Knee to Chest Stretch  - 1 x daily - 7 x weekly - 1 sets - 10 reps - 3-5 sec hold - Supine Double Knee to Chest  - 1 x daily - 7 x weekly - 1 sets - 10 reps - 3-5 sec hold - Single Leg Bridge  - 1 x daily - 7 x weekly - 3 sets - 10 reps - Sidelying Hip Abduction  - 1 x daily - 7 x weekly - 2 sets - 10 reps - Side Plank on Knees  - 1 x daily - 7 x weekly - 2 sets - 5 reps - 1-2 sec hold - Plank on Knees  - 1 x daily - 7 x weekly - 3 sets - 10 reps - Seated Hamstring Stretch  - 1 x daily - 7 x weekly - 1 sets - 2 reps - Seated Figure 4 Piriformis Stretch  - 1 x daily - 7 x weekly - 3 sets - 10 reps - Hip Abduction with Resistance Loop  - 1 x daily - 7 x weekly - 3 sets - 10 reps - Hip Extension with Resistance Loop  - 1 x daily - 7 x weekly - 3 sets - 10 reps - Seated Cervical Extension AROM  - 1 x daily - 7 x weekly - 3 sets - 10 reps - Standing Lumbar Extension  - 1 x daily - 7 x weekly - 1 sets - 10 reps - Gentle Levator Scapulae Stretch  - 1 x daily - 7 x weekly - 3 sets - 3 reps - 1 minute hold - Standing Supported Hip Extension at [employer]  - 1 x daily - 7 x weekly - 2 sets - 10 reps - 3 sec hold - Bird Dog on Counter  - 1 x daily - 7 x weekly - 2 sets - 10 reps - 3 sec hold - Cervical Retraction Prone on Elbows  - 1 x daily - 7 x weekly - 2 sets - 10 reps - 3 sec hold - Mid-Lower Cervical Extension SNAG with Strap  - 1 x daily -  7 x weekly - 2 sets - 10 reps - 3 sec hold - Seated Assisted Cervical Rotation with Towel  - 1 x daily - 7 x weekly - 2 sets - 10 reps - 3 sec hold   ASSESSMENT:  CLINICAL IMPRESSION: Patient has been seen x 2 months PT for LBP/LLE sciatica and cervical pain w/ LUE N/T.  She has made good progress with respect to the low back/sciatic pain and feels about 75% improved.   However, she has reported increased paresthesias into the LUE of  late.   Her cervical MRI has shown disc herniation.  Tried cervical traction today to see if this would improve her symptoms.   She would like to hold off on further PT appointments for now until she has been through her MD appointments next week to see what the plan will be for spot on her kidney and what neurosurgeon will have to say on her cervical spine.   She will call us  back if she wants to make future appointments.  She will need a recertification completed at the next visit.     EVAL: DANITA PROUD is a 68 y.o. female who was referred to physical therapy for evaluation and treatment for R20.2 (ICD-10-CM) - Paresthesia of left arm M25.552 (ICD-10-CM) - Left hip pain.     Patient reports onset of LLE pain beginning 2 weeks ago. Pain is worse with prolonged activities with arms or legs.   Standing or being active worsens.   Sitting reclined helps.  Patient has deficits in lumbar and cervical ROM, L hip and L shoulder flexibility, LLE strength, abnormal cervical and lumbar posture, and TTP with abnormal muscle tension in the paracervicals and L hip which are interfering with ADLs and are impacting quality of life.  On QuickDash patient scored 40.9/50 demonstrating 40.9% or moderate disability.  On LEFS patient scored 31/80 demonstrating  31 / 80 = 38.8 % or moderate disability.  Crystal will benefit from skilled PT to address above deficits to improve mobility and activity tolerance with decreased pain interference.  OBJECTIVE IMPAIRMENTS: difficulty walking, decreased ROM, decreased strength, and pain.   ACTIVITY LIMITATIONS: lifting, bending, and locomotion level  PARTICIPATION LIMITATIONS: meal prep, cleaning, laundry, shopping, and community activity  PERSONAL FACTORS: Age and 1-2 comorbidities: Afib, HTN, anemia, OA, diabetes, neuropathy, recent R TKA 3/25,  are also affecting patient's functional outcome.   REHAB POTENTIAL: Good  CLINICAL DECISION MAKING: Evolving/moderate  complexity  EVALUATION COMPLEXITY: Moderate   GOALS: Goals reviewed with patient? Yes  SHORT TERM GOALS: Target date: 03/14/2024   Patient will be independent with initial HEP to improve outcomes and carryover.  Baseline: 100% PT assist required for correct completion 02/18/24 added to HEP;  9/2:  can return demo initial HEP Goal status: MET  2.  Patient will report 25% improvement in neck and  back pain to improve QOL. Baseline: 8/10 worst;  9/2:  0/10 today;  7/10 worst Goal status: MET  LONG TERM GOALS: Target date: 1017/25  Patient will be independent with ongoing/advanced HEP for self-management at home.  Baseline: no advanced HEP yet 02/18/24 advancing HEP Goal status: IN PROGRESS - 03/20/24 - HEP reviewed and updated/modified today.  2.  Patient will report 50-75% improvement in neck and  back pain to improve QOL.  Baseline: 8/10 worst;   9/2:  7/10 worst over the weekend 03/12/24:  States 0/10 pain last few days 03/26/24:  3/10 in the R hip Goal status: IN PROGRESS  3.  Patient will demonstrate improved posture to decrease muscle imbalance. Baseline: flattened lumbar lordosis and increased upper thoracic kyphosis 9/5:  able to do POE today 03/26/24:  painfree prone press up Goal status: IN PROGRESS   5.  Patient will demonstrate full pain free cervical ROM for safety with driving.  Baseline: see cervical ROM tables above 03/03/24:  25% limited for bilateral sidebending and rotation Goal status: IN PROGRESS  6.  Patient will demonstrate full pain free lumbar ROM to perform ADLs.   Baseline: see lumbar ROM table above Goal status: INITIAL  8.  Patient will report </= 20% on Quick Dash to demonstrate improved functional ability.  Baseline: 40.9 / 100 = 40.9 % Goal status: MET - 03/20/24 - 18.2 / 100 = 18.2 % 04/09/24:  27%  9.  Patient will report </= 60/80 on LEFS  to demonstrate improved functional ability.  Baseline: 31/80 03/12/24:  LEFS = 55/80 04/09/24:   56/80 Goal status: IN PROGRESS  10.  Patient to report ability to perform ADLs, household, and work-related tasks without limitation due to cervical/lumbar/LLE pain, LOM or weakness Baseline: in pain most of the time with chores 03/12/24: Doing better with chores Goal status: IN PROGRESS - 04/03/24 able but still some pain  11.  Patient will tolerate 1hr of (standing/sitting/walking) to perform shopping. Baseline: can stand 1 hr, but in pain Goal status: IN PROGRESS- 04/03/24 can stand but with pain   12.  Patient will report centralization of radicular symptoms.  Baseline: pain runs down the LLE to the knee level 03/12/24:  no radicular symptoms in 2 weeks Goal status: MET PLAN:  PT FREQUENCY: 1-2x/week  PT DURATION: 8 weeks  PLANNED INTERVENTIONS: 97164- PT Re-evaluation, 97750- Physical Performance Testing, 97110-Therapeutic exercises, 97530- Therapeutic activity, 97112- Neuromuscular re-education, 97535- Self Care, 02859- Manual therapy, [id number]- Electrical stimulation (unattended), 97016- Vasopneumatic device, [id number]- Ultrasound, [id number]- Traction (mechanical), [id number]- Ionotophoresis 4mg /ml Dexamethasone , 79439 (1-2 muscles), 20561 (3+ muscles)- Dry Needling, Patient/Family education, Balance training, Stair training, Taping, Joint mobilization, Spinal mobilization, Cryotherapy, and Moist heat  PLAN FOR NEXT SESSION:  Will place patient on a 30 day hold with the option for her to call and schedule further appointments PRN after she sees neurosurgeon.   She will need recertification next visit if she returns for further PT.  PHYSICAL THERAPY DISCHARGE SUMMARY  Visits from Start of Care: 12  Current functional level related to goals / functional outcomes: Patient was seen x 12 visits for LBP, L hip pain, and LUE paresthesias.   She did very well with the hip and back pain and was 75% improved at the time of her last visit.   She wanted to hold further PT due to possible new dx of renal cell  carcinoma and was going for biopsy.   She has not returned to clinic, but we are happy to see her again PRN.   Hopefully she is continuing to do well.   Remaining deficits: Residual mild back/LLE pain but 75% better   Education / Equipment: Patient is independent with all home exercises and advised to continue daily as tolerated and call us  with any questions   Patient agrees to discharge. Patient goals were partially met. Patient is being discharged due to not returning since the last visit.   Kashus Karlen, PT 04/09/2024, 7:52 PM  "

## 2024-04-10 ENCOUNTER — Encounter: Admitting: Rehabilitation

## 2024-04-15 DIAGNOSIS — M5412 Radiculopathy, cervical region: Secondary | ICD-10-CM | POA: Diagnosis not present

## 2024-04-15 DIAGNOSIS — M5416 Radiculopathy, lumbar region: Secondary | ICD-10-CM | POA: Diagnosis not present

## 2024-04-16 DIAGNOSIS — D49511 Neoplasm of unspecified behavior of right kidney: Secondary | ICD-10-CM | POA: Diagnosis not present

## 2024-04-17 ENCOUNTER — Other Ambulatory Visit: Payer: Self-pay | Admitting: Urology

## 2024-04-17 DIAGNOSIS — D49511 Neoplasm of unspecified behavior of right kidney: Secondary | ICD-10-CM

## 2024-04-18 ENCOUNTER — Other Ambulatory Visit: Payer: Self-pay | Admitting: Urology

## 2024-04-18 DIAGNOSIS — D49511 Neoplasm of unspecified behavior of right kidney: Secondary | ICD-10-CM

## 2024-04-22 ENCOUNTER — Ambulatory Visit

## 2024-04-22 DIAGNOSIS — D49511 Neoplasm of unspecified behavior of right kidney: Secondary | ICD-10-CM

## 2024-04-22 DIAGNOSIS — R918 Other nonspecific abnormal finding of lung field: Secondary | ICD-10-CM | POA: Diagnosis not present

## 2024-04-22 DIAGNOSIS — M5412 Radiculopathy, cervical region: Secondary | ICD-10-CM | POA: Diagnosis not present

## 2024-04-22 DIAGNOSIS — I700 Atherosclerosis of aorta: Secondary | ICD-10-CM | POA: Diagnosis not present

## 2024-05-12 ENCOUNTER — Ambulatory Visit
Admission: RE | Admit: 2024-05-12 | Discharge: 2024-05-12 | Disposition: A | Discharge status: Home / Self Care | Source: Ambulatory Visit | Attending: Urology | Admitting: Urology

## 2024-05-12 DIAGNOSIS — D49511 Neoplasm of unspecified behavior of right kidney: Secondary | ICD-10-CM

## 2024-05-12 NOTE — Consult Note (Signed)
 Chief Complaint: Patient was seen in consultation today for a right renal lesion  at the request of Winter,Christopher Beverley  Referring Physician(s): Winter,Christopher Beverley  History of Present Illness: Jean Hopkins is a 68 y.o. female with an incidentally discovered right renal lesion.  Patient had an MRI of the lumbar spine performed on 03/30/2024 for evaluation of lumbar radiculopathy.  Patient complains of weakness in the left leg and numbness in the left hand.  She reports that she is scheduled to have a lower back injection in the future.  Patient has no other significant complaints.  Specifically, she denies abdominal pain, flank pain, hematuria or dysuria.  She denies chest pain or shortness of breath.  No bowel issues.  Past medical history significant for hysterectomy and cholecystectomy.  Past Medical History:  Diagnosis Date   AF (atrial fibrillation) (HCC)    hyperthyroid induced   Allergy    Anemia    Arthritis 11/2020   Right knee   Blood transfusion without reported diagnosis    Cancer (HCC)    Melanoma x2   Cataract    Complication of anesthesia    Slow to wake up with general anesthesia   Controlled type 2 diabetes mellitus without complication, without long-term current use of insulin (HCC) 02/23/2022   no meds   GERD (gastroesophageal reflux disease)    Hypercholesterolemia 08/25/2022   Hypertension    Hyperthyroidism    Neuromuscular disorder (HCC)    neurpathy feet   Pneumonia     Past Surgical History:  Procedure Laterality Date   ABDOMINAL HYSTERECTOMY  2007   CHOLECYSTECTOMY  1988   TOTAL ABDOMINAL HYSTERECTOMY     TOTAL KNEE ARTHROPLASTY Right 09/13/2023   Procedure: ARTHROPLASTY, KNEE, TOTAL;  Surgeon: Ernie Cough, MD;  Location: [facility];  Service: Orthopedics;  Laterality: Right;    Allergies: Patient has no known allergies.  Medications: Prior to Admission medications   Medication Sig Start Date End Date Taking? Authorizing  Provider  acetaminophen  (TYLENOL ) 500 MG tablet Take 1,000 mg by mouth every 6 (six) hours as needed for moderate pain (pain score 4-6).    [provider]  celecoxib  (CELEBREX ) 100 MG capsule Take 100 mg by mouth 2 (two) times daily. 01/18/24   [provider]  Cholecalciferol (VITAMIN D  PO) Take 2,000 Units by mouth daily.    [provider]  hydrochlorothiazide  (HYDRODIURIL ) 12.5 MG tablet Take 1 tablet (12.5 mg total) by mouth daily. 08/15/23   Willo Mini, NP  methimazole  (TAPAZOLE ) 5 MG tablet Take 1 tablet (5 mg total) by mouth as directed. Every other day 10/16/23   Shamleffer, Ibtehal Jaralla, MD  methocarbamol  (ROBAXIN ) 500 MG tablet Take 500 mg by mouth every 6 (six) hours as needed for muscle spasms.    [provider]  omeprazole (PRILOSEC) 20 MG capsule Take 20 mg by mouth daily.    [provider]  tirzepatide CLOYDE) 2.5 MG/0.5ML Pen Inject 2.5 mg into the skin once a week. 03/24/24   Willo Mini, NP     Family History  Problem Relation Age of Onset   Hypertension Mother    Hyperlipidemia Mother    Multiple sclerosis Mother    Osteoporosis Mother    Hyperlipidemia Father    Hypertension Father    Heart disease Father    Hypertension Brother    Hypertension Brother    Hypertension Brother    Diverticulitis Brother    Stomach cancer Paternal Grandmother    Breast  cancer Paternal Aunt    Polycystic ovary syndrome Child    Heart disease Brother    Hypertension Brother    Hypertension Brother    Thyroid  disease Neg Hx    Colon cancer Neg Hx    Esophageal cancer Neg Hx    Rectal cancer Neg Hx     Social History   Socioeconomic History   Marital status: Married    Spouse name: Not on file   Number of children: Not on file   Years of education: Not on file   Highest education level: Master's degree (e.g., MA, MS, MEng, MEd, MSW, MBA)  Occupational History   Not on file  Tobacco Use   Smoking status: Former    Current  packs/day: 0.00    Average packs/day: 0.5 packs/day for 1 year (0.5 ttl pk-yrs)    Types: Cigarettes    Start date: 10/25/1979    Quit date: 10/24/1980    Years since quitting: 43.5   Smokeless tobacco: Never  Vaping Use   Vaping status: Never Used  Substance and Sexual Activity   Alcohol use: Yes    Comment: Once or twice a month   Drug use: No   Sexual activity: Yes    Birth control/protection: Surgical    Comment: hysterectomy  Other Topics Concern   Not on file  Social History Narrative   Not on file   Social Drivers of Health   Financial Resource Strain: Low Risk  (02/09/2024)   Overall Financial Resource Strain (CARDIA)    Difficulty of Paying Living Expenses: Not hard at all  Food Insecurity: No Food Insecurity (02/09/2024)   Hunger Vital Sign    Worried About Running Out of Food in the Last Year: Never true    Ran Out of Food in the Last Year: Never true  Transportation Needs: No Transportation Needs (02/09/2024)   PRAPARE - Administrator, Civil Service (Medical): No    Lack of Transportation (Non-Medical): No  Physical Activity: Sufficiently Active (02/09/2024)   Exercise Vital Sign    Days of Exercise per Week: 5 days    Minutes of Exercise per Session: 30 min  Stress: No Stress Concern Present (02/09/2024)   [employer] of Occupational Health - Occupational Stress Questionnaire    Feeling of Stress: Not at all  Social Connections: Socially Integrated (02/09/2024)   Social Connection and Isolation Panel    Frequency of Communication with Friends and Family: More than three times a week    Frequency of Social Gatherings with Friends and Family: Once a week    Attends Religious Services: More than 4 times per year    Active Member of [employer] or Organizations: Yes    Attends Engineer, Structural: More than 4 times per year    Marital Status: Married    ECOG Status: 0 - Asymptomatic   Review of Systems  Constitutional: Negative.    Respiratory: Negative.    Cardiovascular: Negative.   Gastrointestinal: Negative.   Genitourinary: Negative.     Vital Signs: BP (!) 170/84 (BP Location: Left Arm, Patient Position: Sitting, Cuff Size: Normal)   Pulse 94   Temp (!) 97.5 F (36.4 C) (Oral)   Resp 20   SpO2 98%     Physical Exam Constitutional:      Appearance: She is not ill-appearing.  Cardiovascular:     Rate and Rhythm: Normal rate.  Pulmonary:     Effort: Pulmonary effort is normal.  Abdominal:  General: There is no distension.     Tenderness: There is no abdominal tenderness.  Neurological:     Mental Status: She is alert.     Imaging: CT CHEST WO CONTRAST Result Date: 04/23/2024 CLINICAL DATA:  History of renal mass EXAM: CT CHEST WITHOUT CONTRAST TECHNIQUE: Multidetector CT imaging of the chest was performed following the standard protocol without IV contrast. RADIATION DOSE REDUCTION: This exam was performed according to the departmental dose-optimization program which includes automated exposure control, adjustment of the mA and/or kV according to patient size and/or use of iterative reconstruction technique. COMPARISON:  CT abdomen 04/07/2024 FINDINGS: Cardiovascular: Limited evaluation without intravenous contrast. Mild aortic atherosclerosis. No aneurysm. Normal cardiac size. No pericardial effusion Mediastinum/Nodes: Patent trachea. No thyroid  mass. No suspicious lymph nodes. Esophagus s is within normal limits. Lungs/Pleura: No acute airspace disease, pleural effusion, or pneumothorax. Multiple scattered punctate pulmonary nodules, for example 2 mm superior right lower lobe pulmonary nodule on series 3, image 79, punctate 2 mm left lung base pulmonary nodule on series 3, image 102. No highly suspicious lung nodules. Minimal bronchiectasis in the lower lobes and right middle lobe. Mild scarring in the right upper lobe near the pulmonary fissure. Upper Abdomen: Hepatic steatosis.  Cholecystectomy  Musculoskeletal: No acute osseous abnormality. No suspicious bone lesion IMPRESSION: 1. Multiple scattered punctate pulmonary nodules measuring up to 2 mm, these are favored benign but suggest short-term CT follow-up in 3 months to ensure stability. No definite evidence for metastatic disease to the chest. 2. Hepatic steatosis. 3. Aortic atherosclerosis. Aortic Atherosclerosis (ICD10-I70.0). Electronically Signed   By: Luke Bun M.D.   On: 04/23/2024 23:52   Narrative & Impression  CLINICAL DATA:  Indeterminate right renal lesion identified by prior lumbar spine MRI * Tracking Code: BO *   EXAM: CT ABDOMEN AND PELVIS WITHOUT AND WITH CONTRAST   TECHNIQUE: Multidetector CT imaging of the abdomen and pelvis was performed following the standard protocol before and following the bolus administration of intravenous contrast.   RADIATION DOSE REDUCTION: This exam was performed according to the departmental dose-optimization program which includes automated exposure control, adjustment of the mA and/or kV according to patient size and/or use of iterative reconstruction technique.   CONTRAST:  OMNIPAQUE IOHEXOL 300 MG/ML  SOLN   COMPARISON:  MR lumbar spine, 03/30/2024   FINDINGS: Lower chest: No acute abnormality.   Hepatobiliary: No focal liver abnormality is seen. Status post cholecystectomy. No biliary dilatation.   Pancreas: Unremarkable. No pancreatic ductal dilatation or surrounding inflammatory changes.   Spleen: Normal in size without significant abnormality.   Adrenals/Urinary Tract: Adrenal glands are unremarkable. Peripherally enhancing mixed solid and cystic mass arising from the posterior midportion of the right kidney measuring 1.7 x 1.6 cm (series 6, image 74). Kidneys are normal, without renal calculi, solid lesion, or hydronephrosis. Bladder is unremarkable.   Stomach/Bowel: Stomach is within normal limits. Appendix appears normal. No evidence of bowel  wall thickening, distention, or inflammatory changes. Pancolonic diverticulosis.   Vascular/Lymphatic: No significant vascular findings are present. No enlarged abdominal or pelvic lymph nodes.   Reproductive: Hysterectomy.   Other: No abdominal wall hernia or abnormality. No ascites.   Musculoskeletal: No acute or significant osseous findings.   IMPRESSION: 1. Peripherally enhancing mixed solid and cystic mass arising from the posterior midportion of the right kidney measuring 1.7 x 1.6 cm, consistent with a small renal cell carcinoma. 2. No evidence of renal vein invasion, lymphadenopathy or metastatic disease in the abdomen or pelvis.  3. Pancolonic diverticulosis without evidence of acute diverticulitis. 4. Status post cholecystectomy and hysterectomy.     Electronically Signed   By: Marolyn JONETTA Jaksch M.D.   On: 04/08/2024 16:57     Labs:  CBC: Recent Labs    09/05/23 0830 03/10/24 0936  WBC 8.5 8.0  HGB 15.6* 14.7  HCT 49.0* 46.6  PLT 301 341    COAGS: No results for input(s): INR, APTT in the last 8760 hours.  BMP: Recent Labs    09/05/23 0830 03/10/24 0936  NA 137 141  K 4.1 4.1  CL 101 103  CO2 29 22  GLUCOSE 183* 109*  BUN 17 13  CALCIUM 9.2 9.4  CREATININE 0.94 0.76  GFRNONAA >60  --     LIVER FUNCTION TESTS: Recent Labs    03/10/24 0936  BILITOT 0.4  AST 22  ALT 21  ALKPHOS 76  PROT 6.3  ALBUMIN 4.0    Assessment and Plan:  68 year old female with a suspicious right renal lesion.  The right kidney is horizontally oriented and this lesion is along the posterior upper pole of the right kidney.  Lesion clearly enhances on the CT imaging and measures up to 2.0 cm in the greatest dimension.  I discussed imaging findings with the patient and explained that I suspect this is a renal cell carcinoma.  We discussed management options including surveillance, surgical resection and percutaneous ablation.  Based on the size and location, the  lesion is amendable for image guided cryoablation.  I believe the patient would be a good candidate for this procedure.  We discussed the procedure in depth including the use of general anesthesia and doing a biopsy at the same time.  We discussed the risks of the procedure including bleeding, infection, incomplete treatment and damage to adjacent structures.  Plan for an outpatient procedure. After a long discussion about the procedure risks and benefits, patient decided that she would like to undergo image guided cryoablation of the right renal lesion.  We will schedule CT-guided ablation and biopsy in the near future.  Patient will contact us  if she has any questions or concerns.  Thank you for this interesting consult.  I greatly enjoyed meeting BERNIE FOBES and look forward to participating in their care.  A copy of this report was sent to the requesting provider on this date.  Electronically Signed: Juliene JONELLE Balder 05/12/2024, 11:59 AM   I spent a total of  30 Minutes   in face to face in clinical consultation, greater than 50% of which was counseling/coordinating care for a right renal lesion.

## 2024-05-13 DIAGNOSIS — M5416 Radiculopathy, lumbar region: Secondary | ICD-10-CM | POA: Diagnosis not present

## 2024-05-15 ENCOUNTER — Other Ambulatory Visit (HOSPITAL_COMMUNITY): Payer: Self-pay | Admitting: Diagnostic Radiology

## 2024-05-15 DIAGNOSIS — D49511 Neoplasm of unspecified behavior of right kidney: Secondary | ICD-10-CM

## 2024-06-04 ENCOUNTER — Encounter

## 2024-06-04 ENCOUNTER — Ambulatory Visit: Payer: Self-pay | Admitting: Medical-Surgical

## 2024-06-04 ENCOUNTER — Ambulatory Visit (INDEPENDENT_AMBULATORY_CARE_PROVIDER_SITE_OTHER)

## 2024-06-04 DIAGNOSIS — Z1231 Encounter for screening mammogram for malignant neoplasm of breast: Secondary | ICD-10-CM

## 2024-06-04 DIAGNOSIS — Z780 Asymptomatic menopausal state: Secondary | ICD-10-CM | POA: Diagnosis not present

## 2024-06-05 ENCOUNTER — Other Ambulatory Visit: Payer: Self-pay | Admitting: Medical-Surgical

## 2024-06-05 DIAGNOSIS — Z6841 Body Mass Index (BMI) 40.0 and over, adult: Secondary | ICD-10-CM | POA: Diagnosis not present

## 2024-06-05 DIAGNOSIS — M5416 Radiculopathy, lumbar region: Secondary | ICD-10-CM | POA: Diagnosis not present

## 2024-06-05 DIAGNOSIS — M5412 Radiculopathy, cervical region: Secondary | ICD-10-CM | POA: Diagnosis not present

## 2024-06-05 DIAGNOSIS — Z1231 Encounter for screening mammogram for malignant neoplasm of breast: Secondary | ICD-10-CM

## 2024-06-13 ENCOUNTER — Ambulatory Visit (INDEPENDENT_AMBULATORY_CARE_PROVIDER_SITE_OTHER)

## 2024-06-13 DIAGNOSIS — Z1231 Encounter for screening mammogram for malignant neoplasm of breast: Secondary | ICD-10-CM

## 2024-06-17 ENCOUNTER — Ambulatory Visit: Payer: Self-pay | Admitting: Medical-Surgical

## 2024-06-29 NOTE — Progress Notes (Signed)
 The patient was identified using 2 approved identifiers. All issues noted in this document were discussed and addressed, [name] voiced understanding and agreement with all preoperative instructions. The patient was emailed the surgery instructions per her request.      The patient was instructed to call our Pharmacy [phone]) and the Admitting Office [phone] or [phone]) to complete their Pre-surgical Interview.      COVID Vaccine received:  []  No [x]  Yes Date of any COVID positive Test in last 90 days:  PCP - Zada Palin, DNP   Cardiologist - Jerel Balding, MD  Chest x-ray -07-29-2017 2v    CT Chest 04-22-24  Epic EKG - 03-10-2024  Epic  Stress Test -  ECHO - 08-08-2017  Epic Cardiac Cath -  CT Coronary Calcium score:  Zio Monitor - 08-08-2017  Epic  Pacemaker / ICD device []  No []  Yes   Spinal Cord Stimulator:[]  No []  Yes       History of Sleep Apnea? []  No []  Yes   CPAP used?- []  No []  Yes    Medication on DOS: Omeprazole, Methimazole  ?correct day, Tylenol  prn   Hold DOS:  HCTZ  Patient has: []  NO Hx DM   [x]  Pre-DM   []  DM1  []   DM2 Does the patient monitor blood sugar?   []  N/A   [x]  No []  Yes  Last A1c was:  6.8  on  03-10-2024     Blood Thinner / Instructions:  none  A.fib- d/t hyperthyroidism. Aspirin Instructions:  none  Activity level: Able to walk up 2 flights of stairs without becoming significantly short of breath or having chest pain?   []    Yes   []  No,  would have:  Patient can perform ADLs without assistance.  []   Yes    []  No   Comments:   Anesthesia review: HTN, Pre-DM,( hasn't started Mounjaro  Yet), A.FIB - hyperthyroid induced, Slow to wake up w/ general anes, Hx Raynaud's, GERD,   Patient denies any S&S of respiratory illness or Covid -  no shortness of breath, fever, cough or chest pain at PAT appointment.  Patient verbalized understanding and agreement to the Pre-Surgical Instructions that were given to them at this PAT  appointment. Patient was also educated of the need to review these PAT instructions again prior to her surgery.I reviewed the appropriate phone numbers to call if they have any and questions or concerns.

## 2024-06-29 NOTE — Patient Instructions (Signed)
 SURGICAL WAITING ROOM VISITATION Patients having surgery or a procedure may have no more than 2 support people in the waiting area - these visitors may rotate in the visitor waiting room.   If the patient needs to stay at the hospital during part of their recovery, the visitor guidelines for inpatient rooms apply.  PRE-OP VISITATION  Pre-op nurse will coordinate an appropriate time for 1 support person to accompany the patient in pre-op.  This support person may not rotate.  This visitor will be contacted when the time is appropriate for the visitor to come back in the pre-op area.  To keep our patients, visitors and teammates safe and prevent the spread of respiratory illnesses over the next few months.  Temporary Visitor Restrictions  Children ages 64 and under will not be able to visit patients in [facility] under most circumstances. Visitation is not restricted outside of hospitals unless noted otherwise in the [facility] and Location Specific Visitation Guidelines at:       [email or web address]  Visitors with respiratory illnesses are discouraged from visiting and should remain at home. You are not required to quarantine at this time prior to your surgery. However, you must do this: Hand Hygiene often Do NOT share personal items Notify your provider if you are in close contact with someone who has COVID or you develop fever 100.4 or greater, new onset of sneezing, cough, sore throat, shortness of breath or body aches.  If you test positive for Covid or have been in contact with anyone that has tested positive in the last 10 days please notify you surgeon.    Your procedure is scheduled on:  Wednesday  07-09-2024  Report to [facility] Main Entrance: [address] entrance where the [employer] is available.   Report to admitting at: 06:30   AM  Call this number if you have any questions or problems the morning of surgery [phone]  DO NOT EAT OR DRINK ANYTHING  AFTER MIDNIGHT THE NIGHT PRIOR TO YOUR SURGERY / PROCEDURE.   FOLLOW  ANY ADDITIONAL PRE OP INSTRUCTIONS YOU RECEIVED FROM YOUR SURGEON'S OFFICE!!!   Oral Hygiene is also important to reduce your risk of infection.        Remember - BRUSH YOUR TEETH THE MORNING OF SURGERY WITH YOUR REGULAR TOOTHPASTE  Do NOT smoke after Midnight the night before surgery.  STOP TAKING all Vitamins, Herbs and supplements 1 week before your surgery.   Take ONLY these medicines the morning of surgery with A SIP OF WATER : Omeprazole, Methimazole  (if it's the correct day), Tylenol  if needed for pain.   DO NOT TAKE Hydrochlorothiazide  (HCTZ) the morning of your surgery.                     You may not have any metal on your body including hair pins, jewelry, and body piercing  Do not wear make-up, lotions, powders, perfumes or deodorant  Do not wear nail polish including gel and S&S, artificial / acrylic nails, or any other type of covering on natural nails including finger and toenails. If you have artificial nails, gel coating, etc., that needs to be removed by a nail salon, Please have this removed prior to surgery. Not doing so may mean that your surgery could be cancelled or delayed if the Surgeon or anesthesia staff feels like they are unable to monitor you safely.   Do not shave 48 hours prior to surgery to avoid nicks in your skin which  may contribute to postoperative infections.   Contacts, Hearing Aids, dentures or bridgework may not be worn into surgery. DENTURES WILL BE REMOVED PRIOR TO SURGERY PLEASE DO NOT APPLY Poly grip OR ADHESIVES!!!  You may bring a small overnight bag with you on the day of surgery, only pack items that are not valuable. [facility] IS NOT RESPONSIBLE   FOR VALUABLES THAT ARE LOST OR STOLEN.   Do not bring your home medications to the hospital. The Pharmacy will dispense medications listed on your medication list to you during your admission in the Hospital.  Special  Instructions: Bring a copy of your healthcare power of attorney and living will documents the day of surgery, if you wish to have them scanned into your [facility] Records- EPIC  Please read over the following fact sheets you were given: IF YOU HAVE QUESTIONS ABOUT YOUR PRE-OP INSTRUCTIONS, PLEASE CALL [phone].   [facility] - Preparing for Surgery Before surgery, you can play an important role.  Because skin is not sterile, your skin needs to be as free of germs as possible.  You can reduce the number of germs on your skin by washing with Antibacterial soap before surgery.  . Do not shave (including legs and underarms) for at least 48 hours prior to the first shower.  You may shave your face/neck.  Please follow these instructions carefully:  1.  Shower with antibacterial Soap the night before surgery and the  morning of surgery.  2.  If you choose to wash your hair, wash your hair first as usual with your normal  shampoo.  3.  After you shampoo, rinse your hair and body thoroughly to remove the shampoo.                             4.  You can apply soap directly to the skin and wash.  Gently with a scrungie or clean washcloth.  5.  Wash face,  Genitals (private parts) with your normal soap.             6.  Wash thoroughly, paying special attention to the area where your  surgery  will be performed.  7.  Thoroughly rinse your body with warm water  from the neck down.  8.   Pat yourself dry with a clean towel.             9  Wear clean pajamas.            10 Place clean sheets on your bed the night of your first shower and do not  sleep with pets.  ON THE DAY OF SURGERY : Do not apply any lotions/deodorants the morning of surgery.  Please wear clean clothes to the hospital/surgery center.  FAILURE TO FOLLOW THESE INSTRUCTIONS MAY RESULT IN THE CANCELLATION OF YOUR SURGERY  PATIENT SIGNATURE_________________________________  NURSE SIGNATURE__________________________________

## 2024-06-30 ENCOUNTER — Encounter (HOSPITAL_COMMUNITY)
Admission: RE | Admit: 2024-06-30 | Discharge: 2024-06-30 | Disposition: A | Discharge status: Home / Self Care | Source: Ambulatory Visit | Attending: Diagnostic Radiology | Admitting: Diagnostic Radiology

## 2024-06-30 ENCOUNTER — Other Ambulatory Visit: Payer: Self-pay

## 2024-06-30 ENCOUNTER — Encounter (HOSPITAL_COMMUNITY): Payer: Self-pay

## 2024-06-30 VITALS — BP 148/82 | HR 84 | Temp 98.0°F | Resp 22 | Ht 64.0 in | Wt 231.0 lb

## 2024-06-30 DIAGNOSIS — K760 Fatty (change of) liver, not elsewhere classified: Secondary | ICD-10-CM | POA: Insufficient documentation

## 2024-06-30 DIAGNOSIS — I4891 Unspecified atrial fibrillation: Secondary | ICD-10-CM | POA: Insufficient documentation

## 2024-06-30 DIAGNOSIS — Z01818 Encounter for other preprocedural examination: Secondary | ICD-10-CM

## 2024-06-30 DIAGNOSIS — Z01812 Encounter for preprocedural laboratory examination: Secondary | ICD-10-CM | POA: Insufficient documentation

## 2024-07-08 ENCOUNTER — Other Ambulatory Visit: Payer: Self-pay | Admitting: Radiology

## 2024-07-08 DIAGNOSIS — N2889 Other specified disorders of kidney and ureter: Secondary | ICD-10-CM

## 2024-07-08 NOTE — Anesthesia Preprocedure Evaluation (Signed)
"                                    Anesthesia Evaluation  Patient identified by MRN, date of birth, ID band Patient awake    Reviewed: Allergy & Precautions, NPO status , Patient's Chart, lab work & pertinent test results  History of Anesthesia Complications (+) history of anesthetic complications  Airway Mallampati: IV  TM Distance: <3 FB Neck ROM: Limited    Dental  (+) Teeth Intact, Dental Advisory Given   Pulmonary former smoker   breath sounds clear to auscultation       Cardiovascular hypertension, + dysrhythmias Atrial Fibrillation  Rhythm:Regular Rate:Normal     Neuro/Psych  Neuromuscular disease  negative psych ROS   GI/Hepatic Neg liver ROS,GERD  ,,  Endo/Other  diabetes, Type 2 Hyperthyroidism   Renal/GU negative Renal ROS     Musculoskeletal  (+) Arthritis ,    Abdominal   Peds  Hematology  (+) Blood dyscrasia, anemia   Anesthesia Other Findings   Reproductive/Obstetrics                              Anesthesia Physical Anesthesia Plan  ASA: 3  Anesthesia Plan: General   Post-op Pain Management: Tylenol  PO (pre-op)*   Induction: Intravenous  PONV Risk Score and Plan: 4 or greater and Ondansetron , Dexamethasone , Midazolam  and Scopolamine patch - Pre-op  Airway Management Planned: Oral ETT and Video Laryngoscope Planned  Additional Equipment: None  Intra-op Plan:   Post-operative Plan: Extubation in OR  Informed Consent: I have reviewed the patients History and Physical, chart, labs and discussed the procedure including the risks, benefits and alternatives for the proposed anesthesia with the patient or authorized representative who has indicated his/her understanding and acceptance.     Dental advisory given  Plan Discussed with: CRNA  Anesthesia Plan Comments:          Anesthesia Quick Evaluation  "

## 2024-07-08 NOTE — H&P (Incomplete)
 "  Chief Complaint: Right renal lesion suspicious for renal cell carcinoma; referred for image guided cryoablation/biopsy of right renal lesion  Referring Provider(s): Winter,C  Supervising Physician: Philip Cornet  Patient Status: [facility] - Out-pt  History of Present Illness: Jean Hopkins is a 69 y.o. female ex smoker with PMH sig for afib, fatty liver, anemia, arthritis, melanoma, DM, GERD, HLD, HTN, hyperthyroidism , lumbar radiculopathy and new incidentally discovered 2 cm right renal lesion suspicious for renal cell carcinoma. Pt was seen in consultation by Dr. Philip on 05/12/24 to discuss treatment options for the right renal lesion and was deemed an appropriate candidate for image guided cryoablation/biopsy of the lesion. She presents today for the procedure.    Patient is Full Code  Past Medical History:  Diagnosis Date   AF (atrial fibrillation) (HCC)    hyperthyroid induced   Allergy    Anemia    Arthritis 11/2020   Right knee   Blood transfusion without reported diagnosis    Cancer (HCC)    Melanoma x2   Cataract    Complication of anesthesia    Slow to wake up with general anesthesia   Controlled type 2 diabetes mellitus without complication, without long-term current use of insulin  (HCC) 02/23/2022   no meds   GERD (gastroesophageal reflux disease)    Hypercholesterolemia 08/25/2022   Hypertension    Hyperthyroidism    Neuromuscular disorder (HCC)    neurpathy feet   Pneumonia     Past Surgical History:  Procedure Laterality Date   ABDOMINAL HYSTERECTOMY  2007   CHOLECYSTECTOMY  1988   TOTAL ABDOMINAL HYSTERECTOMY     TOTAL KNEE ARTHROPLASTY Right 09/13/2023   Procedure: ARTHROPLASTY, KNEE, TOTAL;  Surgeon: Ernie Cough, MD;  Location: [facility];  Service: Orthopedics;  Laterality: Right;    Allergies: Patient has no known allergies.  Medications: Prior to Admission medications  Medication Sig Start Date End Date Taking? Authorizing Provider  acetaminophen   (TYLENOL ) 500 MG tablet Take 1,000 mg by mouth every 6 (six) hours as needed for headache.   Yes [provider]  celecoxib  (CELEBREX ) 100 MG capsule Take 100 mg by mouth 2 (two) times daily. 01/18/24  Yes [provider]  Cholecalciferol (VITAMIN D  PO) Take 2,000 Units by mouth daily. 50 Mcg   Yes [provider]  hydrochlorothiazide  (HYDRODIURIL ) 12.5 MG tablet Take 1 tablet (12.5 mg total) by mouth daily. 08/15/23  Yes Willo Mini, NP  methimazole  (TAPAZOLE ) 5 MG tablet Take 1 tablet (5 mg total) by mouth as directed. Every other day 10/16/23  Yes Shamleffer, Ibtehal Jaralla, MD  methocarbamol  (ROBAXIN ) 500 MG tablet Take 500 mg by mouth at bedtime.   Yes [provider]  omeprazole (PRILOSEC) 20 MG capsule Take 20 mg by mouth daily.   Yes [provider]  tirzepatide  (MOUNJARO ) 2.5 MG/0.5ML Pen Inject 2.5 mg into the skin once a week. 03/24/24   Willo Mini, NP     Family History  Problem Relation Age of Onset   Hypertension Mother    Hyperlipidemia Mother    Multiple sclerosis Mother    Osteoporosis Mother    Hyperlipidemia Father    Hypertension Father    Heart disease Father    Hypertension Brother    Hypertension Brother    Hypertension Brother    Diverticulitis Brother    Stomach cancer Paternal Grandmother    Breast cancer Paternal Aunt    Polycystic ovary syndrome Child    Heart disease Brother  Hypertension Brother    Hypertension Brother    Thyroid  disease Neg Hx    Colon cancer Neg Hx    Esophageal cancer Neg Hx    Rectal cancer Neg Hx     Social History   Socioeconomic History   Marital status: Married    Spouse name: Not on file   Number of children: Not on file   Years of education: Not on file   Highest education level: Master's degree (e.g., MA, MS, MEng, MEd, MSW, MBA)  Occupational History   Not on file  Tobacco Use   Smoking status: Former    Current packs/day: 0.00    Average packs/day: 0.5 packs/day for  1 year (0.5 ttl pk-yrs)    Types: Cigarettes    Start date: 10/25/1979    Quit date: 10/24/1980    Years since quitting: 43.7   Smokeless tobacco: Never  Vaping Use   Vaping status: Never Used  Substance and Sexual Activity   Alcohol use: Yes    Comment: Once or twice a month   Drug use: No   Sexual activity: Yes    Birth control/protection: Surgical    Comment: hysterectomy  Other Topics Concern   Not on file  Social History Narrative   Not on file   Social Drivers of Health   Tobacco Use: Medium Risk (06/30/2024)   Patient History    Smoking Tobacco Use: Former    Smokeless Tobacco Use: Never    Passive Exposure: Not on Actuary Strain: Low Risk (02/09/2024)   Overall Financial Resource Strain (CARDIA)    Difficulty of Paying Living Expenses: Not hard at all  Food Insecurity: No Food Insecurity (02/09/2024)   Epic    Worried About Radiation Protection Practitioner of Food in the Last Year: Never true    Ran Out of Food in the Last Year: Never true  Transportation Needs: No Transportation Needs (02/09/2024)   Epic    Lack of Transportation (Medical): No    Lack of Transportation (Non-Medical): No  Physical Activity: Sufficiently Active (02/09/2024)   Exercise Vital Sign    Days of Exercise per Week: 5 days    Minutes of Exercise per Session: 30 min  Stress: No Stress Concern Present (02/09/2024)   [employer] of Occupational Health - Occupational Stress Questionnaire    Feeling of Stress: Not at all  Social Connections: Socially Integrated (02/09/2024)   Social Connection and Isolation Panel    Frequency of Communication with Friends and Family: More than three times a week    Frequency of Social Gatherings with Friends and Family: Once a week    Attends Religious Services: More than 4 times per year    Active Member of Clubs or Organizations: Yes    Attends Banker Meetings: More than 4 times per year    Marital Status: Married  Depression (PHQ2-9): Low Risk  (03/10/2024)   Depression (PHQ2-9)    PHQ-2 Score: 1  Alcohol Screen: Low Risk (02/09/2024)   Alcohol Screen    Last Alcohol Screening Score (AUDIT): 1  Housing: Low Risk (02/09/2024)   Epic    Unable to Pay for Housing in the Last Year: No    Number of Times Moved in the Last Year: 0    Homeless in the Last Year: No  Utilities: Not on file  Health Literacy: Not on file       Review of Systems: denies fever,HA,CP,dyspnea, abd/back pain,N/V or bleeding; she does have occ  cough  Vital Signs: Vitals:   07/09/24 0657  BP: (!) 156/87  Pulse: 100  Resp: 20  Temp: 98.2 F (36.8 C)  SpO2: 92%      Advance Care Plan: no documents on file   Physical Exam: awake/alert; chest- CTA bilat; heart- sl tachy but reg rhythm; abd-soft,obese,+BS,NT; no LE edema  Imaging: MM 3D SCREENING MAMMOGRAM BILATERAL BREAST Result Date: 06/17/2024 CLINICAL DATA:  Screening. EXAM: DIGITAL SCREENING BILATERAL MAMMOGRAM WITH TOMOSYNTHESIS AND CAD TECHNIQUE: Bilateral screening digital craniocaudal and mediolateral oblique mammograms were obtained. Bilateral screening digital breast tomosynthesis was performed. The images were evaluated with computer-aided detection. COMPARISON:  Previous exam(s). ACR Breast Density Category a: The breasts are almost entirely fatty. FINDINGS: There are no findings suspicious for malignancy. IMPRESSION: No mammographic evidence of malignancy. A result letter of this screening mammogram will be mailed directly to the patient. RECOMMENDATION: Screening mammogram in one year. (Code:SM-B-01Y) BI-RADS CATEGORY  1: Negative. Electronically Signed   By: Alm Parkins M.D.   On: 06/17/2024 14:31    Labs:  CBC: Recent Labs    09/05/23 0830 03/10/24 0936  WBC 8.5 8.0  HGB 15.6* 14.7  HCT 49.0* 46.6  PLT 301 341    COAGS: No results for input(s): INR, APTT in the last 8760 hours.  BMP: Recent Labs    09/05/23 0830 03/10/24 0936  NA 137 141  K 4.1 4.1  CL 101 103   CO2 29 22  GLUCOSE 183* 109*  BUN 17 13  CALCIUM 9.2 9.4  CREATININE 0.94 0.76  GFRNONAA >60  --     LIVER FUNCTION TESTS: Recent Labs    03/10/24 0936  BILITOT 0.4  AST 22  ALT 21  ALKPHOS 76  PROT 6.3  ALBUMIN 4.0    TUMOR MARKERS: No results for input(s): AFPTM, CEA, CA199, CHROMGRNA in the last 8760 hours.  Assessment and Plan: 69 y.o. female ex smoker with PMH sig for afib, fatty liver, anemia, arthritis, melanoma, DM, GERD, HLD, HTN, hyperthyroidism , lumbar radiculopathy and new incidentally discovered 2 cm right renal lesion suspicious for renal cell carcinoma. Pt was seen in consultation by Dr. Philip on 05/12/24 to discuss treatment options for the right renal lesion and was deemed an appropriate candidate for image guided cryoablation/biopsy of the lesion. She presents today for the procedure. Risks and benefits of procedure was discussed with the patient including, but not limited to bleeding, infection, damage to adjacent structures, anesthesia related complications or low yield requiring additional tests.  All of the questions were answered and there is agreement to proceed.  Consent signed and in chart.  This procedure involves the use of CT and because of the nature of the planned procedure, it is possible that we will have prolonged use of CT.  Potential radiation risks to you include (but are not limited to) the following: - A slightly elevated risk for cancer  several years later in life. This risk is typically less than 0.5% percent. This risk is low in comparison to the normal incidence of human cancer, which is 33% for women and 50% for men according to the American Cancer Society. - Radiation induced injury can include skin redness, resembling a rash, tissue breakdown / ulcers and hair loss (which can be temporary or permanent).   The likelihood of either of these occurring depends on the difficulty of the procedure and whether you are sensitive  to radiation due to previous procedures, disease, or genetic conditions.   IF your  procedure requires a prolonged use of radiation, you will be notified and given written instructions for further action.  It is your responsibility to monitor the irradiated area for the 2 weeks following the procedure and to notify your physician if you are concerned that you have suffered a radiation induced injury.     Thank you for allowing our service to participate in AERITH CANAL 's care.  LABS PENDING  Electronically Signed: D. Franky Rakers, PA-C   07/08/2024, 1:20 PM      I spent a total of  30 minutes   in face to face in clinical consultation, greater than 50% of which was counseling/coordinating care for image guided right renal lesion cryoablation/biopsy   "

## 2024-07-09 ENCOUNTER — Ambulatory Visit (HOSPITAL_COMMUNITY): Payer: Self-pay | Admitting: Certified Registered Nurse Anesthetist

## 2024-07-09 ENCOUNTER — Ambulatory Visit (HOSPITAL_COMMUNITY)
Admission: RE | Admit: 2024-07-09 | Discharge: 2024-07-09 | Disposition: A | Discharge status: Home / Self Care | Source: Ambulatory Visit | Attending: Diagnostic Radiology | Admitting: Diagnostic Radiology

## 2024-07-09 ENCOUNTER — Ambulatory Visit (HOSPITAL_COMMUNITY)

## 2024-07-09 ENCOUNTER — Encounter (HOSPITAL_COMMUNITY): Payer: Self-pay

## 2024-07-09 ENCOUNTER — Ambulatory Visit (HOSPITAL_COMMUNITY)
Admission: RE | Admit: 2024-07-09 | Discharge: 2024-07-09 | Disposition: A | Discharge status: Home / Self Care | Attending: Diagnostic Radiology | Admitting: Diagnostic Radiology

## 2024-07-09 ENCOUNTER — Encounter (HOSPITAL_COMMUNITY): Payer: Self-pay | Admitting: Certified Registered Nurse Anesthetist

## 2024-07-09 ENCOUNTER — Encounter (HOSPITAL_COMMUNITY): Payer: Self-pay | Admitting: Diagnostic Radiology

## 2024-07-09 ENCOUNTER — Encounter (HOSPITAL_COMMUNITY)
Admission: RE | Disposition: A | Discharge status: Home / Self Care | Payer: Self-pay | Source: Home / Self Care | Attending: Diagnostic Radiology

## 2024-07-09 ENCOUNTER — Other Ambulatory Visit: Payer: Self-pay

## 2024-07-09 DIAGNOSIS — I10 Essential (primary) hypertension: Secondary | ICD-10-CM | POA: Insufficient documentation

## 2024-07-09 DIAGNOSIS — Z7985 Long-term (current) use of injectable non-insulin antidiabetic drugs: Secondary | ICD-10-CM | POA: Diagnosis not present

## 2024-07-09 DIAGNOSIS — M1990 Unspecified osteoarthritis, unspecified site: Secondary | ICD-10-CM | POA: Diagnosis not present

## 2024-07-09 DIAGNOSIS — K219 Gastro-esophageal reflux disease without esophagitis: Secondary | ICD-10-CM | POA: Diagnosis not present

## 2024-07-09 DIAGNOSIS — I4891 Unspecified atrial fibrillation: Secondary | ICD-10-CM

## 2024-07-09 DIAGNOSIS — E785 Hyperlipidemia, unspecified: Secondary | ICD-10-CM | POA: Insufficient documentation

## 2024-07-09 DIAGNOSIS — N2889 Other specified disorders of kidney and ureter: Secondary | ICD-10-CM

## 2024-07-09 DIAGNOSIS — K760 Fatty (change of) liver, not elsewhere classified: Secondary | ICD-10-CM

## 2024-07-09 DIAGNOSIS — E0590 Thyrotoxicosis, unspecified without thyrotoxic crisis or storm: Secondary | ICD-10-CM | POA: Diagnosis not present

## 2024-07-09 DIAGNOSIS — Z85820 Personal history of malignant melanoma of skin: Secondary | ICD-10-CM | POA: Diagnosis not present

## 2024-07-09 DIAGNOSIS — Z87891 Personal history of nicotine dependence: Secondary | ICD-10-CM | POA: Insufficient documentation

## 2024-07-09 DIAGNOSIS — M5416 Radiculopathy, lumbar region: Secondary | ICD-10-CM | POA: Diagnosis not present

## 2024-07-09 DIAGNOSIS — C641 Malignant neoplasm of right kidney, except renal pelvis: Secondary | ICD-10-CM | POA: Insufficient documentation

## 2024-07-09 DIAGNOSIS — D49511 Neoplasm of unspecified behavior of right kidney: Secondary | ICD-10-CM

## 2024-07-09 DIAGNOSIS — E119 Type 2 diabetes mellitus without complications: Secondary | ICD-10-CM | POA: Insufficient documentation

## 2024-07-09 DIAGNOSIS — N289 Disorder of kidney and ureter, unspecified: Secondary | ICD-10-CM | POA: Diagnosis present

## 2024-07-09 DIAGNOSIS — E039 Hypothyroidism, unspecified: Secondary | ICD-10-CM

## 2024-07-09 DIAGNOSIS — Z01818 Encounter for other preprocedural examination: Secondary | ICD-10-CM

## 2024-07-09 HISTORY — PX: RADIOLOGY WITH ANESTHESIA: SHX6223

## 2024-07-09 LAB — CBC WITH DIFFERENTIAL/PLATELET
Abs Immature Granulocytes: 0.02 K/uL (ref 0.00–0.07)
Basophils Absolute: 0.1 K/uL (ref 0.0–0.1)
Basophils Relative: 1 %
Eosinophils Absolute: 0.2 K/uL (ref 0.0–0.5)
Eosinophils Relative: 2 %
HCT: 46.9 % — ABNORMAL HIGH (ref 36.0–46.0)
Hemoglobin: 15.5 g/dL — ABNORMAL HIGH (ref 12.0–15.0)
Immature Granulocytes: 0 %
Lymphocytes Relative: 32 %
Lymphs Abs: 2.7 K/uL (ref 0.7–4.0)
MCH: 26.7 pg (ref 26.0–34.0)
MCHC: 33 g/dL (ref 30.0–36.0)
MCV: 80.7 fL (ref 80.0–100.0)
Monocytes Absolute: 0.7 K/uL (ref 0.1–1.0)
Monocytes Relative: 9 %
Neutro Abs: 4.6 K/uL (ref 1.7–7.7)
Neutrophils Relative %: 56 %
Platelets: 268 K/uL (ref 150–400)
RBC: 5.81 MIL/uL — ABNORMAL HIGH (ref 3.87–5.11)
RDW: 13.4 % (ref 11.5–15.5)
WBC: 8.3 K/uL (ref 4.0–10.5)
nRBC: 0 % (ref 0.0–0.2)

## 2024-07-09 LAB — COMPREHENSIVE METABOLIC PANEL WITH GFR
ALT: 19 U/L (ref 0–44)
AST: 19 U/L (ref 15–41)
Albumin: 4 g/dL (ref 3.5–5.0)
Alkaline Phosphatase: 76 U/L (ref 38–126)
Anion gap: 12 (ref 5–15)
BUN: 15 mg/dL (ref 8–23)
CO2: 27 mmol/L (ref 22–32)
Calcium: 9.5 mg/dL (ref 8.9–10.3)
Chloride: 100 mmol/L (ref 98–111)
Creatinine, Ser: 0.85 mg/dL (ref 0.44–1.00)
GFR, Estimated: >60 mL/min
Glucose, Bld: 159 mg/dL — ABNORMAL HIGH (ref 70–99)
Potassium: 3.6 mmol/L (ref 3.5–5.1)
Sodium: 139 mmol/L (ref 135–145)
Total Bilirubin: 0.6 mg/dL (ref 0.0–1.2)
Total Protein: 7 g/dL (ref 6.5–8.1)

## 2024-07-09 LAB — PROTIME-INR
INR: 0.9 (ref 0.8–1.2)
Prothrombin Time: 13 s (ref 11.4–15.2)

## 2024-07-09 LAB — GLUCOSE, CAPILLARY: Glucose-Capillary: 155 mg/dL — ABNORMAL HIGH (ref 70–99)

## 2024-07-09 MED ORDER — LIDOCAINE HCL (PF) 2 % IJ SOLN
INTRAMUSCULAR | Status: DC | PRN
  Administered 2024-07-09: 10 mg via INTRADERMAL

## 2024-07-09 MED ORDER — DEXAMETHASONE SOD PHOSPHATE PF 10 MG/ML IJ SOLN
INTRAMUSCULAR | Status: AC
  Filled 2024-07-09: qty 1

## 2024-07-09 MED ORDER — ROCURONIUM BROMIDE 10 MG/ML (PF) SYRINGE
PREFILLED_SYRINGE | INTRAVENOUS | Status: DC | PRN
  Administered 2024-07-09: 20 mg via INTRAVENOUS
  Administered 2024-07-09: 50 mg via INTRAVENOUS

## 2024-07-09 MED ORDER — LIDOCAINE HCL (PF) 2 % IJ SOLN
INTRAMUSCULAR | Status: AC
  Filled 2024-07-09: qty 5

## 2024-07-09 MED ORDER — PHENYLEPHRINE 80 MCG/ML (10ML) SYRINGE FOR IV PUSH (FOR BLOOD PRESSURE SUPPORT)
PREFILLED_SYRINGE | INTRAVENOUS | Status: DC | PRN
  Administered 2024-07-09: 160 ug via INTRAVENOUS
  Administered 2024-07-09: 80 ug via INTRAVENOUS

## 2024-07-09 MED ORDER — LACTATED RINGERS IV SOLN: INTRAVENOUS | Status: DC

## 2024-07-09 MED ORDER — PHENYLEPHRINE 80 MCG/ML (10ML) SYRINGE FOR IV PUSH (FOR BLOOD PRESSURE SUPPORT)
PREFILLED_SYRINGE | INTRAVENOUS | Status: AC
  Filled 2024-07-09: qty 10

## 2024-07-09 MED ORDER — ONDANSETRON HCL 4 MG/2ML IJ SOLN
INTRAMUSCULAR | Status: DC | PRN
  Administered 2024-07-09: 4 mg via INTRAVENOUS

## 2024-07-09 MED ORDER — CHLORHEXIDINE GLUCONATE 0.12 % MT SOLN
15.0000 mL | Freq: Once | OROMUCOSAL | Status: AC
  Administered 2024-07-09: 15 mL via OROMUCOSAL
  Filled 2024-07-09: qty 15

## 2024-07-09 MED ORDER — DEXMEDETOMIDINE HCL IN NACL 80 MCG/20ML IV SOLN
INTRAVENOUS | Status: AC
  Filled 2024-07-09: qty 20

## 2024-07-09 MED ORDER — ORAL CARE MOUTH RINSE: 15.0000 mL | Freq: Once | OROMUCOSAL | Status: AC

## 2024-07-09 MED ORDER — PROPOFOL 10 MG/ML IV BOLUS
INTRAVENOUS | Status: AC
  Filled 2024-07-09: qty 20

## 2024-07-09 MED ORDER — DEXAMETHASONE SOD PHOSPHATE PF 10 MG/ML IJ SOLN
INTRAMUSCULAR | Status: DC | PRN
  Administered 2024-07-09: 5 mg via INTRAVENOUS

## 2024-07-09 MED ORDER — SUGAMMADEX SODIUM 200 MG/2ML IV SOLN
INTRAVENOUS | Status: DC | PRN
  Administered 2024-07-09: 200 mg via INTRAVENOUS

## 2024-07-09 MED ORDER — PHENYLEPHRINE HCL (PRESSORS) 10 MG/ML IV SOLN
INTRAVENOUS | Status: AC
  Filled 2024-07-09: qty 1

## 2024-07-09 MED ORDER — FENTANYL CITRATE (PF) 100 MCG/2ML IJ SOLN
INTRAMUSCULAR | Status: DC | PRN
  Administered 2024-07-09: 50 ug via INTRAVENOUS
  Administered 2024-07-09: 100 ug via INTRAVENOUS

## 2024-07-09 MED ORDER — FENTANYL CITRATE (PF) 250 MCG/5ML IJ SOLN
INTRAMUSCULAR | Status: AC
  Filled 2024-07-09: qty 5

## 2024-07-09 MED ORDER — MIDAZOLAM HCL 5 MG/5ML IJ SOLN
INTRAMUSCULAR | Status: DC | PRN
  Administered 2024-07-09: 2 mg via INTRAVENOUS

## 2024-07-09 MED ORDER — SODIUM CHLORIDE 0.9 % IV SOLN
2.0000 g | Freq: Once | INTRAVENOUS | Status: AC
  Administered 2024-07-09: 2 g via INTRAVENOUS
  Filled 2024-07-09: qty 20

## 2024-07-09 MED ORDER — MIDAZOLAM HCL 2 MG/2ML IJ SOLN
INTRAMUSCULAR | Status: AC
  Filled 2024-07-09: qty 2

## 2024-07-09 MED ORDER — ROCURONIUM BROMIDE 10 MG/ML (PF) SYRINGE
PREFILLED_SYRINGE | INTRAVENOUS | Status: AC
  Filled 2024-07-09: qty 10

## 2024-07-09 MED ORDER — GLYCOPYRROLATE 0.2 MG/ML IJ SOLN
INTRAMUSCULAR | Status: AC
  Filled 2024-07-09: qty 2

## 2024-07-09 MED ORDER — PHENYLEPHRINE HCL-NACL 20-0.9 MG/250ML-% IV SOLN
INTRAVENOUS | Status: DC | PRN
  Administered 2024-07-09: 40 ug/min via INTRAVENOUS

## 2024-07-09 MED ORDER — PROPOFOL 10 MG/ML IV BOLUS
INTRAVENOUS | Status: DC | PRN
  Administered 2024-07-09: 200 mg via INTRAVENOUS

## 2024-07-09 MED ORDER — CHLORHEXIDINE GLUCONATE CLOTH 2 % EX PADS: 6.0000 | MEDICATED_PAD | Freq: Every day | CUTANEOUS | Status: DC

## 2024-07-09 MED ORDER — SUGAMMADEX SODIUM 200 MG/2ML IV SOLN
INTRAVENOUS | Status: AC
  Filled 2024-07-09: qty 2

## 2024-07-09 MED ORDER — INSULIN ASPART 100 UNIT/ML IJ SOLN: 0.0000 [IU] | INTRAMUSCULAR | Status: DC | PRN

## 2024-07-09 MED ORDER — SODIUM CHLORIDE 0.9 % IV SOLN: INTRAVENOUS | Status: DC

## 2024-07-09 MED ORDER — ONDANSETRON HCL 4 MG/2ML IJ SOLN
INTRAMUSCULAR | Status: AC
  Filled 2024-07-09: qty 2

## 2024-07-09 MED ORDER — EPHEDRINE 5 MG/ML INJ
INTRAVENOUS | Status: AC
  Filled 2024-07-09: qty 5

## 2024-07-09 NOTE — Procedures (Signed)
 Interventional Radiology Procedure:   Indications: Right renal lesion  Procedure: CT guided biopsy and cryoablation of right renal lesion  Findings: Partially exophytic lesion in posterior right kidney.  Two 18 gauge core biopsies and scant tissue obtained.  Cryoablation performed with two IceRod needles.  Treatment was 10 minute freeze - 8 minute thaw - 10 minute freeze - 5 minute thaw followed by tract ablation.     Complications: None     EBL: Minimal  Plan: Bedrest 3 hours and then plan for discharge today.    Jean Kriegel R. Philip, MD  Pager: [phone]

## 2024-07-09 NOTE — Discharge Summary (Signed)
 "  Patient ID: Jean Hopkins MRN: [id number] DOB/AGE: February 19, 1956 69 y.o.  Admit date: 07/09/2024 Discharge date: 07/09/2024  Supervising Physician: Philip Cornet  Patient Status: Jean Hopkins  Admission Diagnoses: Right renal lesion concerning for renal cell carcinoma  Discharge Diagnoses: Right renal lesion concerning for renal cell carcinoma, status post image guided cryoablation and biopsy on 07/09/2024 Past Medical History:  Diagnosis Date   AF (atrial fibrillation) (HCC)    hyperthyroid induced   Allergy    Anemia    Arthritis 11/2020   Right knee   Blood transfusion without reported diagnosis    Cancer (HCC)    Melanoma x2   Cataract    Complication of anesthesia    Slow to wake up with general anesthesia   Controlled type 2 diabetes mellitus without complication, without long-term current use of insulin  (HCC) 02/23/2022   no meds   GERD (gastroesophageal reflux disease)    Hypercholesterolemia 08/25/2022   Hypertension    Hyperthyroidism    Neuromuscular disorder (HCC)    neurpathy feet   Pneumonia    Past Surgical History:  Procedure Laterality Date   ABDOMINAL HYSTERECTOMY  2007   CHOLECYSTECTOMY  1988   TOTAL ABDOMINAL HYSTERECTOMY     TOTAL KNEE ARTHROPLASTY Right 09/13/2023   Procedure: ARTHROPLASTY, KNEE, TOTAL;  Surgeon: Ernie Cough, MD;  Location: [facility];  Service: Orthopedics;  Laterality: Right;     Discharged Condition: good  Hospital Course: Jean Hopkins is a 69 y.o. female ex smoker with PMH sig for afib, fatty liver, anemia, arthritis, melanoma, DM, GERD, HLD, HTN, hyperthyroidism , lumbar radiculopathy and new incidentally discovered 2 cm right renal lesion suspicious for renal cell carcinoma. Pt was seen in consultation by Dr. Philip on 05/12/24 to discuss treatment options for the right renal lesion and was deemed an appropriate candidate for image guided cryoablation/biopsy of the lesion.  On 07/09/2024 she underwent image guided cryoablation and biopsy of the  suspicious right renal lesion via general anesthesia.  The procedure was performed without immediate complications and the patient was subsequently extubated and observed in PACU for 3 hours postprocedure.  Patient denied fever, headache, chest pain, dyspnea, cough, abdominal/back pain, nausea, vomiting or visible bleeding.  She was able to tolerate small amount of food without difficulty, void without difficulty.  No significant hematuria noted.  Patient was seen by Dr. Philip and deemed stable for discharge home at this time.  She will resume home medications with exception of Celebrex  which she will resume in 2 to 3 days.  Patient will be scheduled for follow-up with Dr. Philip in IR clinic ( virtual or in person) in 1 month.  Patient will continue follow-up with Dr. Pershing urology as scheduled.  She was told to contact our service with any additional questions.  Consults: anesthesia  Significant Diagnostic Studies:  Results for orders placed or performed during the hospital encounter of 07/09/24  Glucose, capillary  Result Value Ref Range   Glucose-Capillary 155 (H) 70 - 99 mg/dL   Comment 1 Notify RN   Results for orders placed or performed during the hospital encounter of 07/09/24  Comprehensive metabolic panel per protocol  Result Value Ref Range   Sodium 139 135 - 145 mmol/L   Potassium 3.6 3.5 - 5.1 mmol/L   Chloride 100 98 - 111 mmol/L   CO2 27 22 - 32 mmol/L   Glucose, Bld 159 (H) 70 - 99 mg/dL   BUN 15 8 - 23 mg/dL   Creatinine,  Ser 0.85 0.44 - 1.00 mg/dL   Calcium 9.5 8.9 - 89.6 mg/dL   Total Protein 7.0 6.5 - 8.1 g/dL   Albumin 4.0 3.5 - 5.0 g/dL   AST 19 15 - 41 U/L   ALT 19 0 - 44 U/L   Alkaline Phosphatase 76 38 - 126 U/L   Total Bilirubin 0.6 0.0 - 1.2 mg/dL   GFR, Estimated >39 >39 mL/min   Anion gap 12 5 - 15  CBC with Differential/Platelet  Result Value Ref Range   WBC 8.3 4.0 - 10.5 K/uL   RBC 5.81 (H) 3.87 - 5.11 MIL/uL   Hemoglobin 15.5 (H) 12.0 - 15.0 g/dL    HCT 53.0 (H) 63.9 - 46.0 %   MCV 80.7 80.0 - 100.0 fL   MCH 26.7 26.0 - 34.0 pg   MCHC 33.0 30.0 - 36.0 g/dL   RDW 86.5 88.4 - 84.4 %   Platelets 268 150 - 400 K/uL   nRBC 0.0 0.0 - 0.2 %   Neutrophils Relative % 56 %   Neutro Abs 4.6 1.7 - 7.7 K/uL   Lymphocytes Relative 32 %   Lymphs Abs 2.7 0.7 - 4.0 K/uL   Monocytes Relative 9 %   Monocytes Absolute 0.7 0.1 - 1.0 K/uL   Eosinophils Relative 2 %   Eosinophils Absolute 0.2 0.0 - 0.5 K/uL   Basophils Relative 1 %   Basophils Absolute 0.1 0.0 - 0.1 K/uL   Immature Granulocytes 0 %   Abs Immature Granulocytes 0.02 0.00 - 0.07 K/uL  Protime-INR  Result Value Ref Range   Prothrombin Time 13.0 11.4 - 15.2 seconds   INR 0.9 0.8 - 1.2       Treatments: CT guided biopsy and cryoablation of right renal lesion on 07/09/2024 via general anesthesia  Discharge Exam: Blood pressure (!) 148/75, pulse 90, temperature 97.6 F (36.4 C), temperature source Oral, resp. rate 14, height 5' 4 (1.626 m), weight 229 lb 4.5 oz (104 kg), SpO2 (!) 87%. Awake, alert.  Chest clear to auscultation bilaterally.  Heart with regular rate and rhythm.  Abdomen obese, soft, positive bowel sounds, nontender.  Puncture site right flank clean, dry, nontender, no obvious hematoma.  No lower extremity edema.  Disposition: Discharge disposition: 01-Home or Self Care       Discharge Instructions     Call MD for:  difficulty breathing, headache or visual disturbances   Complete by: As directed    Call MD for:  extreme fatigue   Complete by: As directed    Call MD for:  hives   Complete by: As directed    Call MD for:  persistant dizziness or light-headedness   Complete by: As directed    Call MD for:  persistant nausea and vomiting   Complete by: As directed    Call MD for:  redness, tenderness, or signs of infection (pain, swelling, redness, odor or green/yellow discharge around incision site)   Complete by: As directed    Call MD for:  severe  uncontrolled pain   Complete by: As directed    Call MD for:  temperature >100.4   Complete by: As directed    Change dressing (specify)   Complete by: As directed    May change bandage over the right flank on 1/15.  May wash site with soap and water .   Diet - low sodium heart healthy   Complete by: As directed    Discharge instructions   Complete by: As directed  May resume home medications, may resume Celebrex  in 2 to 3 days.  Stay well-hydrated.  Avoid strenuous activity for the next 1 week   Driving Restrictions   Complete by: As directed    No driving for 24 hours   Increase activity slowly   Complete by: As directed    Lifting restrictions   Complete by: As directed    No heavy lifting for the next 1 week   May shower / Bathe   Complete by: As directed    May walk up steps   Complete by: As directed       Allergies as of 07/09/2024   No Known Allergies      Medication List     TAKE these medications    acetaminophen  500 MG tablet Commonly known as: TYLENOL  Take 1,000 mg by mouth every 6 (six) hours as needed for headache.   celecoxib  100 MG capsule Commonly known as: CELEBREX  Take 100 mg by mouth 2 (two) times daily.   hydrochlorothiazide  12.5 MG tablet Commonly known as: HYDRODIURIL  Take 1 tablet (12.5 mg total) by mouth daily.   methimazole  5 MG tablet Commonly known as: TAPAZOLE  Take 1 tablet (5 mg total) by mouth as directed. Every other day   methocarbamol  500 MG tablet Commonly known as: ROBAXIN  Take 500 mg by mouth at bedtime.   omeprazole 20 MG capsule Commonly known as: PRILOSEC Take 20 mg by mouth daily.   tirzepatide  2.5 MG/0.5ML Pen Commonly known as: MOUNJARO  Inject 2.5 mg into the skin once a week.   VITAMIN D  PO Take 2,000 Units by mouth daily. 50 Mcg               Discharge Care Instructions  (From admission, onward)           Start     Ordered   07/09/24 0000  Change dressing (specify)       Comments: May  change bandage over the right flank on 1/15.  May wash site with soap and water .   07/09/24 1450            Follow-up Information     Philip Cornet, MD Follow up.   Specialties: Interventional Radiology, Radiology Why: Radiology team will contact patient regarding follow-up with Dr. Philip in IR clinic in 1 month.  Call [phone] for any questions Contact information: [address] [place] [address] [phone]         Devere Lonni Righter, MD Follow up.   Specialty: Urology Why: Follow-up with Dr. Devere as scheduled Contact information: [address] [place] [address] [phone]                  Electronically Signed: D. Franky Rakers, PA-C 07/09/2024, 3:01 PM   I have spent Less Than 30 Minutes discharging Jean Hopkins.      "

## 2024-07-09 NOTE — Anesthesia Postprocedure Evaluation (Signed)
"   Anesthesia Post Note  Patient: Lendora Keys Quiros  Procedure(s) Performed: CT WITH ANESTHESIA (Right)     Patient location during evaluation: PACU Anesthesia Type: General Level of consciousness: awake and alert Pain management: pain level controlled Vital Signs Assessment: post-procedure vital signs reviewed and stable Respiratory status: spontaneous breathing, nonlabored ventilation, respiratory function stable and patient connected to nasal cannula oxygen Cardiovascular status: blood pressure returned to baseline and stable Postop Assessment: no apparent nausea or vomiting Anesthetic complications: no   No notable events documented.  Last Vitals:  Vitals:   07/09/24 1215 07/09/24 1230  BP: (!) 146/85 (!) 148/75  Pulse: 91 90  Resp: 10 14  Temp:    SpO2: (!) 86% (!) 87%    Last Pain:  Vitals:   07/09/24 1230  TempSrc:   PainSc: 0-No pain                 Franky JONETTA Bald      "

## 2024-07-09 NOTE — Discharge Instructions (Signed)
 May resume home medications, may resume Celebrex  in 2 to 3 days.  Avoid strenuous activity for 1 week.  Stay well-hydrated. Call [phone] with any questions.

## 2024-07-09 NOTE — Anesthesia Procedure Notes (Signed)
 Procedure Name: Intubation Date/Time: 07/09/2024 8:55 AM  Performed by: Franchot Delon RAMAN, CRNAPre-anesthesia Checklist: Patient identified, Emergency Drugs available, Suction available and Patient being monitored Patient Re-evaluated:Patient Re-evaluated prior to induction Oxygen Delivery Method: Circle System Utilized Preoxygenation: Pre-oxygenation with 100% oxygen Induction Type: IV induction Ventilation: Mask ventilation without difficulty Laryngoscope Size: Glidescope and 3 Grade View: Grade I Tube type: Oral Tube size: 7.0 mm Number of attempts: 1 Airway Equipment and Method: Video-laryngoscopy and Rigid stylet Placement Confirmation: ETT inserted through vocal cords under direct vision, positive ETCO2 and breath sounds checked- equal and bilateral Secured at: 22 cm Tube secured with: Tape Dental Injury: Teeth and Oropharynx as per pre-operative assessment  Comments: Elective Glyde Scope 3.0 for small oral aperture. Gr 1 view.

## 2024-07-09 NOTE — Transfer of Care (Addendum)
 Immediate Anesthesia Transfer of Care Note  Patient: Jean Hopkins  Procedure(s) Performed: CT WITH ANESTHESIA (Right)  Patient Location: PACU  Anesthesia Type:General  Level of Consciousness: drowsy and patient cooperative  Airway & Oxygen Therapy: Patient Spontanous Breathing and Patient connected to nasal cannula oxygen  Post-op Assessment: Report given to RN and Post -op Vital signs reviewed and stable  Post vital signs: Reviewed and stable  Last Vitals:  Vitals Value Taken Time  BP 161/83   Temp    Pulse 86 07/09/24 13:22  Resp 20   SpO2 95 % 07/09/24 13:22  Vitals shown include unfiled device data.  Last Pain:  Vitals:   07/09/24 1230  TempSrc:   PainSc: 0-No pain         Complications: No notable events documented.

## 2024-07-09 NOTE — Sedation Documentation (Signed)
 General anesthesia case, CRNA to sedate and monitor patient during case.

## 2024-07-10 ENCOUNTER — Encounter (HOSPITAL_COMMUNITY): Payer: Self-pay | Admitting: Radiology

## 2024-07-10 LAB — SURGICAL PATHOLOGY

## 2024-07-15 ENCOUNTER — Encounter: Payer: Self-pay | Admitting: Medical-Surgical

## 2024-07-15 MED ORDER — ONETOUCH ULTRA TEST VI STRP: ORAL_STRIP | 12 refills | Status: DC

## 2024-07-22 ENCOUNTER — Other Ambulatory Visit (HOSPITAL_COMMUNITY): Payer: Self-pay

## 2024-07-25 ENCOUNTER — Encounter: Payer: Self-pay | Admitting: Medical-Surgical

## 2024-07-25 ENCOUNTER — Other Ambulatory Visit (HOSPITAL_COMMUNITY): Payer: Self-pay

## 2024-07-28 MED ORDER — LANCET DEVICE MISC: 1.0000 | 0 refills | Status: AC

## 2024-07-28 MED ORDER — BLOOD GLUCOSE TEST VI STRP: 1.0000 | ORAL_STRIP | 11 refills | Status: AC

## 2024-07-28 MED ORDER — LANCETS MISC: 1.0000 | 11 refills | Status: AC

## 2024-07-28 MED ORDER — BLOOD GLUCOSE MONITORING SUPPL DEVI: 1.0000 | 0 refills | Status: AC

## 2024-07-31 ENCOUNTER — Other Ambulatory Visit: Payer: Self-pay | Admitting: Urology

## 2024-07-31 DIAGNOSIS — N2889 Other specified disorders of kidney and ureter: Secondary | ICD-10-CM

## 2024-08-18 ENCOUNTER — Other Ambulatory Visit

## 2024-09-08 ENCOUNTER — Ambulatory Visit: Admitting: Medical-Surgical

## 2024-10-14 ENCOUNTER — Ambulatory Visit: Admitting: Internal Medicine

## 2025-03-12 ENCOUNTER — Ambulatory Visit
